# Patient Record
Sex: Male | Born: 1985 | Race: White | Hispanic: No | Marital: Single | Smoking: Never smoker
Health system: Southern US, Community
[De-identification: ages and names within clinical notes are randomized; demographics above are authoritative.]

## PROBLEM LIST (undated history)

## (undated) DIAGNOSIS — I10 Essential (primary) hypertension: Secondary | ICD-10-CM

## (undated) DIAGNOSIS — K589 Irritable bowel syndrome without diarrhea: Secondary | ICD-10-CM

## (undated) DIAGNOSIS — F988 Other specified behavioral and emotional disorders with onset usually occurring in childhood and adolescence: Secondary | ICD-10-CM

## (undated) DIAGNOSIS — E8881 Metabolic syndrome: Secondary | ICD-10-CM

## (undated) DIAGNOSIS — A0472 Enterocolitis due to Clostridium difficile, not specified as recurrent: Secondary | ICD-10-CM

## (undated) DIAGNOSIS — R7303 Prediabetes: Secondary | ICD-10-CM

## (undated) DIAGNOSIS — F329 Major depressive disorder, single episode, unspecified: Secondary | ICD-10-CM

## (undated) DIAGNOSIS — K829 Disease of gallbladder, unspecified: Secondary | ICD-10-CM

## (undated) DIAGNOSIS — E785 Hyperlipidemia, unspecified: Secondary | ICD-10-CM

## (undated) DIAGNOSIS — J45909 Unspecified asthma, uncomplicated: Secondary | ICD-10-CM

## (undated) DIAGNOSIS — M2550 Pain in unspecified joint: Secondary | ICD-10-CM

## (undated) DIAGNOSIS — M7989 Other specified soft tissue disorders: Secondary | ICD-10-CM

## (undated) DIAGNOSIS — K219 Gastro-esophageal reflux disease without esophagitis: Secondary | ICD-10-CM

## (undated) DIAGNOSIS — M549 Dorsalgia, unspecified: Secondary | ICD-10-CM

## (undated) DIAGNOSIS — E559 Vitamin D deficiency, unspecified: Secondary | ICD-10-CM

## (undated) DIAGNOSIS — R011 Cardiac murmur, unspecified: Secondary | ICD-10-CM

## (undated) DIAGNOSIS — C4490 Unspecified malignant neoplasm of skin, unspecified: Secondary | ICD-10-CM

## (undated) DIAGNOSIS — R519 Headache, unspecified: Secondary | ICD-10-CM

## (undated) DIAGNOSIS — R002 Palpitations: Secondary | ICD-10-CM

## (undated) DIAGNOSIS — G43109 Migraine with aura, not intractable, without status migrainosus: Secondary | ICD-10-CM

## (undated) DIAGNOSIS — D649 Anemia, unspecified: Secondary | ICD-10-CM

## (undated) DIAGNOSIS — K047 Periapical abscess without sinus: Secondary | ICD-10-CM

## (undated) DIAGNOSIS — B9689 Other specified bacterial agents as the cause of diseases classified elsewhere: Secondary | ICD-10-CM

## (undated) DIAGNOSIS — J309 Allergic rhinitis, unspecified: Principal | ICD-10-CM

## (undated) DIAGNOSIS — F419 Anxiety disorder, unspecified: Secondary | ICD-10-CM

## (undated) DIAGNOSIS — I499 Cardiac arrhythmia, unspecified: Secondary | ICD-10-CM

## (undated) DIAGNOSIS — R0602 Shortness of breath: Secondary | ICD-10-CM

## (undated) HISTORY — DX: Pain in unspecified joint: M25.50

## (undated) HISTORY — DX: Periapical abscess without sinus: K04.7

## (undated) HISTORY — DX: Irritable bowel syndrome without diarrhea: K58.9

## (undated) HISTORY — DX: Other specified behavioral and emotional disorders with onset usually occurring in childhood and adolescence: F98.8

## (undated) HISTORY — DX: Enterocolitis due to Clostridium difficile, not specified as recurrent: A04.72

## (undated) HISTORY — DX: Hyperlipidemia, unspecified: E78.5

## (undated) HISTORY — DX: Disease of gallbladder, unspecified: K82.9

## (undated) HISTORY — DX: Irritable bowel syndrome, unspecified: K58.9

## (undated) HISTORY — DX: Unspecified malignant neoplasm of skin, unspecified: C44.90

## (undated) HISTORY — DX: Metabolic syndrome: E88.81

## (undated) HISTORY — DX: Migraine with aura, not intractable, without status migrainosus: G43.109

## (undated) HISTORY — DX: Palpitations: R00.2

## (undated) HISTORY — DX: Anemia, unspecified: D64.9

## (undated) HISTORY — DX: Morbid (severe) obesity due to excess calories: E66.01

## (undated) HISTORY — PX: SKIN CANCER EXCISION: SHX779

## (undated) HISTORY — DX: Cardiac arrhythmia, unspecified: I49.9

## (undated) HISTORY — DX: Shortness of breath: R06.02

## (undated) HISTORY — DX: Other specified soft tissue disorders: M79.89

## (undated) HISTORY — DX: Major depressive disorder, single episode, unspecified: F32.9

## (undated) HISTORY — DX: Cardiac murmur, unspecified: R01.1

## (undated) HISTORY — DX: Vitamin D deficiency, unspecified: E55.9

## (undated) HISTORY — DX: Essential (primary) hypertension: I10

## (undated) HISTORY — DX: Unspecified asthma, uncomplicated: J45.909

## (undated) HISTORY — DX: Allergic rhinitis, unspecified: J30.9

## (undated) HISTORY — DX: Dorsalgia, unspecified: M54.9

## (undated) HISTORY — DX: Anxiety disorder, unspecified: F41.9

## (undated) HISTORY — DX: Prediabetes: R73.03

## (undated) HISTORY — DX: Other specified bacterial agents as the cause of diseases classified elsewhere: B96.89

## (undated) HISTORY — DX: Headache, unspecified: R51.9

---
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
## 2005-03-03 ENCOUNTER — Ambulatory Visit: Payer: Self-pay | Admitting: Internal Medicine

## 2005-03-05 ENCOUNTER — Ambulatory Visit: Payer: Self-pay | Admitting: Cardiology

## 2005-09-09 ENCOUNTER — Ambulatory Visit: Payer: Self-pay | Admitting: Internal Medicine

## 2005-10-06 ENCOUNTER — Ambulatory Visit: Payer: Self-pay | Admitting: Internal Medicine

## 2005-10-12 HISTORY — PX: ANKLE SURGERY: SHX546

## 2005-10-30 ENCOUNTER — Ambulatory Visit: Payer: Self-pay | Admitting: Internal Medicine

## 2005-10-30 ENCOUNTER — Ambulatory Visit: Payer: Self-pay | Admitting: Cardiology

## 2005-11-09 ENCOUNTER — Ambulatory Visit: Payer: Self-pay | Admitting: Internal Medicine

## 2005-11-27 ENCOUNTER — Ambulatory Visit: Payer: Self-pay | Admitting: Internal Medicine

## 2005-12-04 ENCOUNTER — Ambulatory Visit: Payer: Self-pay | Admitting: Internal Medicine

## 2005-12-04 ENCOUNTER — Ambulatory Visit (HOSPITAL_COMMUNITY): Admission: RE | Admit: 2005-12-04 | Discharge: 2005-12-04 | Payer: Self-pay | Admitting: Internal Medicine

## 2006-01-18 ENCOUNTER — Ambulatory Visit: Payer: Self-pay | Admitting: Internal Medicine

## 2006-02-28 ENCOUNTER — Encounter: Admission: RE | Admit: 2006-02-28 | Discharge: 2006-02-28 | Payer: Self-pay | Admitting: Orthopedic Surgery

## 2006-04-30 ENCOUNTER — Ambulatory Visit: Payer: Self-pay | Admitting: Internal Medicine

## 2006-05-07 ENCOUNTER — Encounter: Admission: RE | Admit: 2006-05-07 | Discharge: 2006-05-07 | Payer: Self-pay | Admitting: Orthopedic Surgery

## 2007-02-15 ENCOUNTER — Ambulatory Visit: Payer: Self-pay | Admitting: Internal Medicine

## 2007-03-08 ENCOUNTER — Ambulatory Visit: Payer: Self-pay | Admitting: Internal Medicine

## 2007-03-08 LAB — CONVERTED CEMR LAB
ALT: 32 units/L (ref 0–40)
AST: 24 units/L (ref 0–37)
Basophils Relative: 1 % (ref 0.0–1.0)
Bilirubin, Direct: 0.2 mg/dL (ref 0.0–0.3)
CO2: 32 meq/L (ref 19–32)
Calcium: 9.5 mg/dL (ref 8.4–10.5)
Chloride: 106 meq/L (ref 96–112)
Creatinine, Ser: 0.8 mg/dL (ref 0.4–1.5)
Eosinophils Absolute: 0.2 10*3/uL (ref 0.0–0.6)
Eosinophils Relative: 3.5 % (ref 0.0–5.0)
GFR calc non Af Amer: 131 mL/min
Glucose, Bld: 88 mg/dL (ref 70–99)
HCT: 43.3 % (ref 39.0–52.0)
Hgb A1c MFr Bld: 5.5 % (ref 4.6–6.0)
MCV: 81.9 fL (ref 78.0–100.0)
Neutrophils Relative %: 55.7 % (ref 43.0–77.0)
RBC: 5.29 M/uL (ref 4.22–5.81)
Sodium: 142 meq/L (ref 135–145)
Total Bilirubin: 1.4 mg/dL — ABNORMAL HIGH (ref 0.3–1.2)
Total CHOL/HDL Ratio: 5.2
Total Protein: 7.3 g/dL (ref 6.0–8.3)
VLDL: 19 mg/dL (ref 0–40)
WBC: 7 10*3/uL (ref 4.5–10.5)

## 2007-03-17 ENCOUNTER — Ambulatory Visit: Payer: Self-pay | Admitting: Cardiovascular Disease

## 2007-06-13 ENCOUNTER — Emergency Department (HOSPITAL_COMMUNITY): Admission: EM | Admit: 2007-06-13 | Discharge: 2007-06-13 | Payer: Self-pay | Admitting: Emergency Medicine

## 2007-06-15 ENCOUNTER — Ambulatory Visit: Payer: Self-pay | Admitting: Internal Medicine

## 2007-09-13 ENCOUNTER — Ambulatory Visit: Payer: Self-pay | Admitting: Internal Medicine

## 2007-09-13 ENCOUNTER — Telehealth: Payer: Self-pay | Admitting: Internal Medicine

## 2007-09-13 DIAGNOSIS — F988 Other specified behavioral and emotional disorders with onset usually occurring in childhood and adolescence: Secondary | ICD-10-CM | POA: Insufficient documentation

## 2007-09-13 DIAGNOSIS — K219 Gastro-esophageal reflux disease without esophagitis: Secondary | ICD-10-CM | POA: Insufficient documentation

## 2007-09-13 DIAGNOSIS — F3289 Other specified depressive episodes: Secondary | ICD-10-CM

## 2007-09-13 DIAGNOSIS — I10 Essential (primary) hypertension: Secondary | ICD-10-CM | POA: Insufficient documentation

## 2007-09-13 DIAGNOSIS — F329 Major depressive disorder, single episode, unspecified: Secondary | ICD-10-CM

## 2007-09-13 DIAGNOSIS — F411 Generalized anxiety disorder: Secondary | ICD-10-CM | POA: Insufficient documentation

## 2007-09-13 HISTORY — DX: Major depressive disorder, single episode, unspecified: F32.9

## 2007-09-13 HISTORY — DX: Essential (primary) hypertension: I10

## 2007-09-13 HISTORY — DX: Other specified depressive episodes: F32.89

## 2007-09-13 HISTORY — DX: Other specified behavioral and emotional disorders with onset usually occurring in childhood and adolescence: F98.8

## 2008-07-19 ENCOUNTER — Encounter: Payer: Self-pay | Admitting: Internal Medicine

## 2008-07-19 ENCOUNTER — Ambulatory Visit: Payer: Self-pay | Admitting: Internal Medicine

## 2008-07-19 DIAGNOSIS — J45909 Unspecified asthma, uncomplicated: Secondary | ICD-10-CM

## 2008-07-19 DIAGNOSIS — J4530 Mild persistent asthma, uncomplicated: Secondary | ICD-10-CM | POA: Insufficient documentation

## 2008-07-19 HISTORY — DX: Unspecified asthma, uncomplicated: J45.909

## 2008-07-19 HISTORY — DX: Morbid (severe) obesity due to excess calories: E66.01

## 2008-07-23 ENCOUNTER — Encounter: Payer: Self-pay | Admitting: Internal Medicine

## 2008-08-15 ENCOUNTER — Ambulatory Visit: Payer: Self-pay | Admitting: Internal Medicine

## 2008-08-22 ENCOUNTER — Telehealth: Payer: Self-pay | Admitting: Internal Medicine

## 2008-08-28 ENCOUNTER — Telehealth: Payer: Self-pay | Admitting: Internal Medicine

## 2008-11-28 ENCOUNTER — Ambulatory Visit: Payer: Self-pay | Admitting: Endocrinology

## 2008-12-03 ENCOUNTER — Emergency Department: Payer: Self-pay

## 2008-12-03 ENCOUNTER — Emergency Department (HOSPITAL_COMMUNITY): Admission: EM | Admit: 2008-12-03 | Discharge: 2008-12-03 | Payer: Self-pay | Admitting: Emergency Medicine

## 2009-03-18 ENCOUNTER — Emergency Department (HOSPITAL_COMMUNITY): Admission: EM | Admit: 2009-03-18 | Discharge: 2009-03-18 | Payer: Self-pay | Admitting: Emergency Medicine

## 2009-03-18 ENCOUNTER — Telehealth: Payer: Self-pay | Admitting: Internal Medicine

## 2009-03-20 ENCOUNTER — Ambulatory Visit: Payer: Self-pay | Admitting: Internal Medicine

## 2009-03-20 DIAGNOSIS — K589 Irritable bowel syndrome without diarrhea: Secondary | ICD-10-CM

## 2009-03-20 HISTORY — DX: Irritable bowel syndrome, unspecified: K58.9

## 2009-03-21 LAB — CONVERTED CEMR LAB
Basophils Relative: 0.3 % (ref 0.0–3.0)
Eosinophils Absolute: 0.2 10*3/uL (ref 0.0–0.7)
HCT: 44.3 % (ref 39.0–52.0)
Hemoglobin: 15 g/dL (ref 13.0–17.0)
Lymphs Abs: 2.5 10*3/uL (ref 0.7–4.0)
MCHC: 33.8 g/dL (ref 30.0–36.0)
MCV: 85.8 fL (ref 78.0–100.0)
Monocytes Absolute: 0.8 10*3/uL (ref 0.1–1.0)
Neutro Abs: 5.3 10*3/uL (ref 1.4–7.7)
RBC: 5.16 M/uL (ref 4.22–5.81)

## 2009-06-10 ENCOUNTER — Ambulatory Visit: Payer: Self-pay | Admitting: Internal Medicine

## 2009-06-10 DIAGNOSIS — J310 Chronic rhinitis: Secondary | ICD-10-CM | POA: Insufficient documentation

## 2009-06-10 DIAGNOSIS — J309 Allergic rhinitis, unspecified: Secondary | ICD-10-CM

## 2009-06-10 HISTORY — DX: Allergic rhinitis, unspecified: J30.9

## 2009-08-20 ENCOUNTER — Telehealth: Payer: Self-pay | Admitting: Internal Medicine

## 2009-10-10 ENCOUNTER — Ambulatory Visit: Payer: Self-pay | Admitting: Internal Medicine

## 2009-10-10 ENCOUNTER — Encounter: Payer: Self-pay | Admitting: Internal Medicine

## 2009-10-10 DIAGNOSIS — E785 Hyperlipidemia, unspecified: Secondary | ICD-10-CM | POA: Insufficient documentation

## 2009-10-10 HISTORY — DX: Hyperlipidemia, unspecified: E78.5

## 2009-10-10 LAB — CONVERTED CEMR LAB
ALT: 35 units/L (ref 0–53)
Albumin: 3.6 g/dL (ref 3.5–5.2)
Alkaline Phosphatase: 62 units/L (ref 39–117)
Basophils Relative: 1 % (ref 0.0–3.0)
Bilirubin, Direct: 0.2 mg/dL (ref 0.0–0.3)
CO2: 31 meq/L (ref 19–32)
Calcium: 9.2 mg/dL (ref 8.4–10.5)
Chloride: 104 meq/L (ref 96–112)
Eosinophils Absolute: 0.2 10*3/uL (ref 0.0–0.7)
Eosinophils Relative: 2.2 % (ref 0.0–5.0)
Hemoglobin: 14.7 g/dL (ref 13.0–17.0)
LDL Cholesterol: 108 mg/dL — ABNORMAL HIGH (ref 0–99)
Lymphocytes Relative: 17.1 % (ref 12.0–46.0)
MCHC: 34.2 g/dL (ref 30.0–36.0)
MCV: 86.5 fL (ref 78.0–100.0)
Neutro Abs: 4.2 10*3/uL (ref 1.4–7.7)
RBC: 4.97 M/uL (ref 4.22–5.81)
Sodium: 140 meq/L (ref 135–145)
Total CHOL/HDL Ratio: 4
Total Protein: 8 g/dL (ref 6.0–8.3)
WBC: 7 10*3/uL (ref 4.5–10.5)

## 2009-10-25 ENCOUNTER — Ambulatory Visit: Payer: Self-pay | Admitting: Internal Medicine

## 2009-10-25 DIAGNOSIS — E8881 Metabolic syndrome: Secondary | ICD-10-CM

## 2009-10-25 HISTORY — DX: Metabolic syndrome: E88.81

## 2009-10-25 HISTORY — DX: Metabolic syndrome: E88.810

## 2009-10-25 LAB — CONVERTED CEMR LAB: LDL Goal: 130 mg/dL

## 2009-12-02 ENCOUNTER — Ambulatory Visit: Payer: Self-pay | Admitting: Internal Medicine

## 2009-12-02 ENCOUNTER — Emergency Department (HOSPITAL_COMMUNITY): Admission: EM | Admit: 2009-12-02 | Discharge: 2009-12-03 | Payer: Self-pay | Admitting: Emergency Medicine

## 2009-12-03 ENCOUNTER — Telehealth: Payer: Self-pay | Admitting: Internal Medicine

## 2009-12-04 ENCOUNTER — Telehealth: Payer: Self-pay | Admitting: Internal Medicine

## 2009-12-05 ENCOUNTER — Ambulatory Visit: Payer: Self-pay | Admitting: Internal Medicine

## 2009-12-12 ENCOUNTER — Ambulatory Visit: Payer: Self-pay | Admitting: Internal Medicine

## 2009-12-12 ENCOUNTER — Encounter: Payer: Self-pay | Admitting: Internal Medicine

## 2009-12-13 ENCOUNTER — Ambulatory Visit: Payer: Self-pay | Admitting: Cardiology

## 2010-06-11 ENCOUNTER — Emergency Department (HOSPITAL_COMMUNITY): Admission: EM | Admit: 2010-06-11 | Discharge: 2010-06-12 | Payer: Self-pay | Admitting: Emergency Medicine

## 2010-06-12 ENCOUNTER — Telehealth: Payer: Self-pay | Admitting: Internal Medicine

## 2010-06-13 ENCOUNTER — Ambulatory Visit: Payer: Self-pay | Admitting: Internal Medicine

## 2010-06-13 DIAGNOSIS — T7840XA Allergy, unspecified, initial encounter: Secondary | ICD-10-CM | POA: Insufficient documentation

## 2010-06-13 LAB — CONVERTED CEMR LAB
AST: 21 units/L (ref 0–37)
Albumin: 3.8 g/dL (ref 3.5–5.2)
Alkaline Phosphatase: 55 units/L (ref 39–117)
Basophils Relative: 0.4 % (ref 0.0–3.0)
Calcium: 9.1 mg/dL (ref 8.4–10.5)
GFR calc non Af Amer: 115.95 mL/min (ref >60)
Hemoglobin: 15 g/dL (ref 13.0–17.0)
Lymphocytes Relative: 15.4 % (ref 12.0–46.0)
Monocytes Relative: 3.2 % (ref 3.0–12.0)
Neutro Abs: 13.6 10*3/uL — ABNORMAL HIGH (ref 1.4–7.7)
RBC: 5.23 M/uL (ref 4.22–5.81)
Sodium: 137 meq/L (ref 135–145)
Total Protein: 7.3 g/dL (ref 6.0–8.3)

## 2010-06-15 ENCOUNTER — Encounter: Payer: Self-pay | Admitting: Internal Medicine

## 2010-06-18 ENCOUNTER — Telehealth: Payer: Self-pay | Admitting: Internal Medicine

## 2010-06-30 ENCOUNTER — Ambulatory Visit: Payer: Self-pay | Admitting: Internal Medicine

## 2010-06-30 DIAGNOSIS — J019 Acute sinusitis, unspecified: Secondary | ICD-10-CM | POA: Insufficient documentation

## 2010-06-30 DIAGNOSIS — D72829 Elevated white blood cell count, unspecified: Secondary | ICD-10-CM | POA: Insufficient documentation

## 2010-06-30 DIAGNOSIS — R059 Cough, unspecified: Secondary | ICD-10-CM | POA: Insufficient documentation

## 2010-06-30 DIAGNOSIS — R05 Cough: Secondary | ICD-10-CM | POA: Insufficient documentation

## 2010-06-30 LAB — CONVERTED CEMR LAB
Basophils Relative: 0.6 % (ref 0.0–3.0)
Eosinophils Absolute: 0.3 10*3/uL (ref 0.0–0.7)
IgE (Immunoglobulin E), Serum: 64.1 intl units/mL (ref 0.0–180.0)
Lymphs Abs: 2.9 10*3/uL (ref 0.7–4.0)
MCHC: 33.5 g/dL (ref 30.0–36.0)
MCV: 84.8 fL (ref 78.0–100.0)
Monocytes Absolute: 0.7 10*3/uL (ref 0.1–1.0)
Neutrophils Relative %: 64.4 % (ref 43.0–77.0)
RBC: 5 M/uL (ref 4.22–5.81)

## 2010-07-01 ENCOUNTER — Telehealth: Payer: Self-pay | Admitting: Internal Medicine

## 2010-11-02 ENCOUNTER — Encounter: Payer: Self-pay | Admitting: Orthopedic Surgery

## 2010-11-13 NOTE — Progress Notes (Signed)
  Phone Note Call from Patient   Caller: Patient [phone] Call For: Etta Grandchild MD Summary of Call: Pt requesting lab results, results letter not rec'd yet in the mail. Please call pt Initial call taken by: Verdell Face,  June 18, 2010 11:43 AM  Follow-up for Phone Call        Pt recieved results in the mail. Follow-up by: Lamar Sprinkles, CMA,  June 18, 2010 12:18 PM

## 2010-11-13 NOTE — Progress Notes (Signed)
Summary: sick call  Phone Note Call from Patient Call back at Home Phone [phone]   Caller: spouse--Lisa [phone] Summary of Call: Patient spouse called stating that the patient would like for MD to know how sick he is. Patient feels that this is something more serious than what was discussed at office visit. Per spouse the patient had to go to the ER Monday night due to headache, chills, increased BP, increased heart rate, and fever of 102.5. Patient was told at the ER that he was negative for pneumonia, strep, and mono. Please advise. Initial call taken by: Lucious Groves,  December 04, 2009 1:46 PM  Follow-up for Phone Call        schedule a f/up with me in the next 1-2 days Follow-up by: Etta Grandchild MD,  December 04, 2009 1:52 PM     Appended Document: sick call Spoke with patient and he advised me that his father just had hand surgery and he will call back for appt later today or tomorrow.

## 2010-11-13 NOTE — Miscellaneous (Signed)
Summary: Orders Update pft charges  Clinical Lists Changes  Orders: Added new Service order of Carbon Monoxide diffusing w/capacity (94720) - Signed Added new Service order of Lung Volumes (94240) - Signed Added new Service order of Spirometry (Pre & Post) (94060) - Signed 

## 2010-11-13 NOTE — Letter (Signed)
Summary: Results Follow-up Letter  [facility]  [address] [place], [place] [address]   Phone: [phone]  Fax: [phone]    06/15/2010  [address] [place], [place]  [address]  Dear Mr. Berrett,   The following are the results of your recent test(s):  Test     Result     CBC       high WBC count, ? from steroids Liver/kidney   normal Thyroid     normal   _________________________________________________________  Please call for an appointment in 2-3 weeks _________________________________________________________ _________________________________________________________ _________________________________________________________  Sincerely,  Sanda Linger MD [facility]

## 2010-11-13 NOTE — Assessment & Plan Note (Signed)
Summary: f/u appt/#/cd   Vital Signs:  Patient profile:   25 year old male Height:      75 inches Weight:      362.25 pounds BMI:     45.44 O2 Sat:      97 % on Room air Temp:     98.1 degrees F oral Pulse rate:   87 / minute Pulse rhythm:   regular Resp:     16 per minute BP sitting:   122 / 88  (left arm)  Vitals Entered By: Lucious Groves (December 05, 2009 2:03 PM)  Nutrition Counseling: Patient's BMI is greater than 25 and therefore counseled on weight management options.  O2 Flow:  Room air CC: Rtn ov--Pt states that he is doing worse. C/O HA, fever, increased BP, coughing up blood, increased SOB, and pustules on tonsils./kb, Hypertension Management Comments Per the patient he had to go to the ER on Monday night for symptoms above and this is how he found out about pustules on tonsils./kb   Primary Care Provider:  Sanda Linger, MD  CC:  Rtn ov--Pt states that he is doing worse. C/O HA, fever, increased BP, coughing up blood, increased SOB, and pustules on tonsils./kb, and Hypertension Management.  History of Present Illness: He returns for f/up and is slowly getting better with a decrease in his sore throat and fever. He went to the ER three days ago and had a normal Chest Xray and other labs showed a slight increase in WBC count. He has finished the Zithromax. His headache has resolved.  Hypertension History:      He denies chest pain, palpitations, dyspnea with exertion, peripheral edema, visual symptoms, neurologic problems, syncope, and side effects from treatment.  He notes no problems with any antihypertensive medication side effects.        Positive major cardiovascular risk factors include hyperlipidemia and hypertension.  Negative major cardiovascular risk factors include male age less than 76 years old, no history of diabetes, negative family history for ischemic heart disease, and non-tobacco-user status.        Further assessment for target organ damage reveals no  history of ASHD, cardiac end-organ damage (CHF/LVH), stroke/TIA, peripheral vascular disease, renal insufficiency, or hypertensive retinopathy.     Preventive Screening-Counseling & Management  Alcohol-Tobacco     Alcohol drinks/day: <1     Smoking Status: never     Passive Smoke Exposure: no  Hep-HIV-STD-Contraception     Hepatitis Risk: no risk noted     HIV Risk: no     STD Risk: no risk noted      Sexual History:  not active.        Drug Use:  no.    Medications Prior to Update: 1)  Wellbutrin 75 Mg Tabs (Bupropion Hcl) .... Take 1 Tablet By Mouth Once A Day Follow-Up Appt Is Due No Addtional Refilsl Until Appt 2)  Concerta 36 Mg  Tbcr (Methylphenidate Hcl) .... Take 1 Tablet By Mouth Once A Day As Needed 3)  Xanax 0.5 Mg  Tabs (Alprazolam) .... Take 1 Tablet By Mouth Once A Day As Needed 4)  Omeprazole 40 Mg Cpdr (Omeprazole) .Marland Kitchen.. 1 Once Daily/stop Zegerid 5)  Hyoscyamine Sulfate 0.125 Mg Subl (Hyoscyamine Sulfate) .... As Needed For Cramping 6)  Hyoscyamine Sulfate Cr 0.375 Mg  Tb12 (Hyoscyamine Sulfate) .... Take 1 Tablet By Mouth Two Times A Day 7)  Cetirizine Hcl 10 Mg Tabs (Cetirizine Hcl) .Marland Kitchen.. 1po Once Daily As Needed 8)  Omnaris  50 Mcg/act Susp (Ciclesonide) .... 2 Spray/side Once Daily 9)  Losartan Potassium 100 Mg Tabs (Losartan Potassium) .... Once Daily For High Blood Pressure 10)  Mytussin Ac 100-10 Mg/32ml Syrp (Guaifenesin-Codeine) .... 5-10 Ml By Mouth Qid As Needed For Cough  Current Medications (verified): 1)  Wellbutrin 75 Mg Tabs (Bupropion Hcl) .... Take 1 Tablet By Mouth Once A Day Follow-Up Appt Is Due No Addtional Refilsl Until Appt 2)  Concerta 36 Mg  Tbcr (Methylphenidate Hcl) .... Take 1 Tablet By Mouth Once A Day As Needed 3)  Xanax 0.5 Mg  Tabs (Alprazolam) .... Take 1 Tablet By Mouth Once A Day As Needed 4)  Omeprazole 40 Mg Cpdr (Omeprazole) .Marland Kitchen.. 1 Once Daily/stop Zegerid 5)  Hyoscyamine Sulfate 0.125 Mg Subl (Hyoscyamine Sulfate) .... As Needed  For Cramping 6)  Hyoscyamine Sulfate Cr 0.375 Mg  Tb12 (Hyoscyamine Sulfate) .... Take 1 Tablet By Mouth Two Times A Day 7)  Cetirizine Hcl 10 Mg Tabs (Cetirizine Hcl) .Marland Kitchen.. 1po Once Daily As Needed 8)  Omnaris 50 Mcg/act Susp (Ciclesonide) .... 2 Spray/side Once Daily 9)  Losartan Potassium 100 Mg Tabs (Losartan Potassium) .... Once Daily For High Blood Pressure 10)  Mytussin Ac 100-10 Mg/59ml Syrp (Guaifenesin-Codeine) .... 5-10 Ml By Mouth Qid As Needed For Cough 11)  Proair Hfa 108 (90 Base) Mcg/act Aers (Albuterol Sulfate) .Marland Kitchen.. 1-2 Puffs Qid As Needed For Sob and Wheezing  Allergies (verified): 1)  ! Septra 2)  ! Augmentin  Past History:  Past Medical History: Reviewed history from 10/10/2009 and no changes required. morbid obesity migraine headache attention deficit disorder anxiety/depression gastroesophageal reflux disease allergic rhinitis Irritable bowel syndrome Hypertension Allergic rhinitis Hyperlipidemia  Past Surgical History: Reviewed history from 09/13/2007 and no changes required. right ankle surgery due to fracture Appendectomy  Family History: Reviewed history from 03/20/2009 and no changes required. Family History of Clotting disorder: Mother Family History of Heart Disease: Both Parents Families  Social History: Reviewed history from 07/19/2008 and no changes required. Occupation:  works [employer] Never Smoked Alcohol use-yes Drug use-no Regular exercise-no Sexual History:  not active  Review of Systems       The patient complains of fever.  The patient denies anorexia, chest pain, syncope, dyspnea on exertion, peripheral edema, prolonged cough, hemoptysis, abdominal pain, hematuria, suspicious skin lesions, difficulty walking, enlarged lymph nodes, and angioedema.   ENT:  Complains of sore throat; denies decreased hearing, difficulty swallowing, ear discharge, earache, nasal congestion, postnasal drainage, ringing in ears, and sinus pressure. Resp:   Complains of shortness of breath and wheezing; denies chest discomfort, chest pain with inspiration, coughing up blood, morning headaches, pleuritic, and sputum productive.  Physical Exam  General:  mild distress with an intermittent cough.  alert, well-developed, well-nourished, well-hydrated, appropriate dress, cooperative to examination, good hygiene, overweight-appearing, and mild distress.   Head:  normocephalic, atraumatic, no abnormalities observed, and no abnormalities palpated.   Ears:  R ear normal and L ear normal.   Nose:  nasal discharge, mucosal erythema, and mucosal edema.  no airflow obstruction, no intranasal foreign body, no nasal polyps, no nasal mucosal lesions, no mucosal friability, no active bleeding or clots, no septum abnormalities, nasal dischargemucosal pallor, mucosal erythema, mucosal edema, L maxillary sinus tenderness, and R maxillary sinus tenderness.   Mouth:  good dentition, no exudates, no posterior lymphoid hypertrophy, no postnasal drip, no pharyngeal crowing, no lesions, no aphthous ulcers, no erosions, no tongue abnormalities, no leukoplakia, no petechiae, and pharyngeal erythema.   Neck:  No deformities, masses, or tenderness noted. Lungs:  Normal respiratory effort, chest expands symmetrically. Lungs are clear to auscultation, no crackles or wheezes. Heart:  Normal rate and regular rhythm. S1 and S2 normal without gallop, murmur, click, rub or other extra sounds. Abdomen:  Bowel sounds positive,abdomen soft and non-tender without masses, organomegaly or hernias noted. Msk:  No deformity or scoliosis noted of thoracic or lumbar spine.   Pulses:  R and L carotid,radial,femoral,dorsalis pedis and posterior tibial pulses are full and equal bilaterally Extremities:  No clubbing, cyanosis, edema, or deformity noted with normal full range of motion of all joints.   Neurologic:  No cranial nerve deficits noted. Station and gait are normal. Plantar reflexes are  down-going bilaterally. DTRs are symmetrical throughout. Sensory, motor and coordinative functions appear intact. Skin:  Intact without suspicious lesions or rashes Cervical Nodes:  no anterior cervical adenopathy and no posterior cervical adenopathy.   Axillary Nodes:  no R axillary adenopathy and no L axillary adenopathy.   Inguinal Nodes:  no R inguinal adenopathy and no L inguinal adenopathy.   Psych:  Oriented X3, memory intact for recent and remote, normally interactive, good eye contact, not anxious appearing, not depressed appearing, not agitated, and not suicidal.     Impression & Recommendations:  Problem # 1:  BRONCHITIS-ACUTE (ICD-466.0) Assessment Unchanged this sounds viral but I am a little concerned about asthma so will start proair, PFT's are scheduled for next week. His updated medication list for this problem includes:    Mytussin Ac 100-10 Mg/16ml Syrp (Guaifenesin-codeine) .Marland Kitchen... 5-10 ml by mouth qid as needed for cough    Proair Hfa 108 (90 Base) Mcg/act Aers (Albuterol sulfate) .Marland Kitchen... 1-2 puffs qid as needed for sob and wheezing  Problem # 2:  ASTHMA, UNSPECIFIED, UNSPECIFIED STATUS (ICD-493.90) Assessment: Unchanged  His updated medication list for this problem includes:    Proair Hfa 108 (90 Base) Mcg/act Aers (Albuterol sulfate) .Marland Kitchen... 1-2 puffs qid as needed for sob and wheezing  Problem # 3:  HYPERTENSION (ICD-401.9) Assessment: Improved  His updated medication list for this problem includes:    Losartan Potassium 100 Mg Tabs (Losartan potassium) ..... Once daily for high blood pressure  BP today: 122/88 Prior BP: 120/88 (12/02/2009)  Prior 10 Yr Risk Heart Disease: 4 % (08/15/2008)  Labs Reviewed: K+: 4.1 (10/10/2009) Creat: : 1.0 (10/10/2009)   Chol: 162 (10/10/2009)   HDL: 38.40 (10/10/2009)   LDL: 108 (10/10/2009)   TG: 77.0 (10/10/2009)  Complete Medication List: 1)  Wellbutrin 75 Mg Tabs (Bupropion hcl) .... Take 1 tablet by mouth once a day  follow-up appt is due no addtional refilsl until appt 2)  Concerta 36 Mg Tbcr (Methylphenidate hcl) .... Take 1 tablet by mouth once a day as needed 3)  Xanax 0.5 Mg Tabs (Alprazolam) .... Take 1 tablet by mouth once a day as needed 4)  Omeprazole 40 Mg Cpdr (Omeprazole) .Marland Kitchen.. 1 once daily/stop zegerid 5)  Hyoscyamine Sulfate 0.125 Mg Subl (Hyoscyamine sulfate) .... As needed for cramping 6)  Hyoscyamine Sulfate Cr 0.375 Mg Tb12 (Hyoscyamine sulfate) .... Take 1 tablet by mouth two times a day 7)  Cetirizine Hcl 10 Mg Tabs (Cetirizine hcl) .Marland Kitchen.. 1po once daily as needed 8)  Omnaris 50 Mcg/act Susp (Ciclesonide) .... 2 spray/side once daily 9)  Losartan Potassium 100 Mg Tabs (Losartan potassium) .... Once daily for high blood pressure 10)  Mytussin Ac 100-10 Mg/80ml Syrp (Guaifenesin-codeine) .... 5-10 ml by mouth qid as needed for cough 11)  Proair Hfa 108 (90 Base) Mcg/act Aers (Albuterol sulfate) .Marland Kitchen.. 1-2 puffs qid as needed for sob and wheezing  Hypertension Assessment/Plan:      The patient's hypertensive risk group is category B: At least one risk factor (excluding diabetes) with no target organ damage.  His calculated 10 year risk of coronary heart disease is 4 %.  Today's blood pressure is 122/88.  His blood pressure goal is < 140/90.  Patient Instructions: 1)  It is important to use your inhaler properly. Use a spacer, take slow deep breaths and hold them. Rinse your mouth after using.  2)  Get plenty of rest, drink lots of clear liquids, and use Tylenol or Ibuprofen for fever and comfort. Return in 7-10 days if you're not better:sooner if you're feeling worse. 3)  Please schedule a follow-up appointment in 2 weeks. Prescriptions: PROAIR HFA 108 (90 BASE) MCG/ACT AERS (ALBUTEROL SULFATE) 1-2 puffs QID as needed for sob and wheezing  #1 inh x 11   Entered and Authorized by:   Etta Grandchild MD   Signed by:   Etta Grandchild MD on 12/05/2009   Method used:   Electronically to        [facility]   [address] [phone]* (retail)       [address]       [place], [place]  [address]       Ph: [phone]       Fax: [phone]   RxID:   [id number]

## 2010-11-13 NOTE — Progress Notes (Signed)
Summary: RESULTS  Phone Note Call from Patient Call back at Home Phone [phone] Call back at [phone]   Caller: Mother - Misty Stanley Summary of Call: Pt's mother is req results of cxr.  Initial call taken by: Lamar Sprinkles, CMA,  July 01, 2010 5:10 PM  Follow-up for Phone Call        chest xray was normal, WBC is still very slightly elevated, allergy tests are not back yet Follow-up by: Etta Grandchild MD,  July 02, 2010 7:40 AM  Additional Follow-up for Phone Call Additional follow up Details #1::        Pt notified.Alvy Beal Archie CMA  July 02, 2010 4:36 PM

## 2010-11-13 NOTE — Assessment & Plan Note (Signed)
Summary: FU Alexis [name]   Vital Signs:  Patient profile:   25 year old male Height:      75 inches Weight:      391 pounds BMI:     49.05 O2 Sat:      96 % on Room air Temp:     100.2 degrees F oral Pulse rate:   90 / minute Pulse rhythm:   regular Resp:     16 per minute BP sitting:   124 / 88  (left arm) Cuff size:   large  Vitals Entered By: Alysia Penna (June 30, 2010 3:53 PM)  Nutrition Counseling: Patient's BMI is greater than 25 and therefore counseled on weight management options.  O2 Flow:  Room air CC: pt here for follow up. has had a cough for about a week. /cp sma, Cough Comments pt no longer taking Hyoscyamine   Primary Care [name]:  Sanda Linger, MD  CC:  pt here for follow up. has had a cough for about a week. /cp sma and Cough.  History of Present Illness:  Cough      This is a 25 year old man who presents with Cough.  The symptoms began 1 week ago.  The intensity is described as mild.  The patient reports productive cough and fever, but denies non-productive cough, pleuritic chest pain, shortness of breath, wheezing, exertional dyspnea, hemoptysis, and malaise.  Associated symtpoms include cold/URI symptoms, sore throat, nasal congestion, and chronic rhinitis.  The patient denies the following symptoms: weight loss, acid reflux symptoms, and peripheral edema.  The cough is worse with cold exposure.  Ineffective prior treatments have included albuterol inhaler and other asthma medication.  Risk factors include recurrent sinus infections and history of allergic rhinitis.    Preventive Screening-Counseling & Management  Alcohol-Tobacco     Alcohol drinks/day: <1     Alcohol type: all     >5/day in last 3 mos: no     Alcohol Counseling: to STOP drinking     Feels need to cut down: no     Feels annoyed by complaints: no     Feels guilty re: drinking: no     Needs 'eye opener' in am: no     Smoking Status: never     Passive Smoke Exposure: yes  Tobacco Counseling: not indicated; no tobacco use  Hep-HIV-STD-Contraception     Hepatitis Risk: no risk noted     HIV Risk: no risk noted     STD Risk: no risk noted      Sexual History:  not active.        Drug Use:  never.        Blood Transfusions:  no.    Clinical Review Panels:  Prevention   Last Colonoscopy:  Results: Normal. Location:  [facility].  (12/04/2005)  Immunizations   Last Flu Vaccine:  Fluvax 3+ (09/24/2009)  Lipid Management   Cholesterol:  162 (10/10/2009)   LDL (bad choesterol):  108 (10/10/2009)   HDL (good cholesterol):  38.40 (10/10/2009)  Diabetes Management   HgBA1C:  5.5 (03/08/2007)   Creatinine:  0.9 (06/13/2010)   Last Flu Vaccine:  Fluvax 3+ (09/24/2009)  CBC   WBC:  16.8 (06/13/2010)   RBC:  5.23 (06/13/2010)   Hgb:  15.0 (06/13/2010)   Hct:  44.3 (06/13/2010)   Platelets:  234.0 (06/13/2010)   MCV  84.7 (06/13/2010)   MCHC  33.8 (06/13/2010)   RDW  14.0 (06/13/2010)   PMN:  80.9 (06/13/2010)   Lymphs:  15.4 (06/13/2010)   Monos:  3.2 (06/13/2010)   Eosinophils:  0.1 (06/13/2010)   Basophil:  0.4 (06/13/2010)  Complete Metabolic Panel   Glucose:  76 (06/13/2010)   Sodium:  137 (06/13/2010)   Potassium:  4.0 (06/13/2010)   Chloride:  102 (06/13/2010)   CO2:  26 (06/13/2010)   BUN:  10 (06/13/2010)   Creatinine:  0.9 (06/13/2010)   Albumin:  3.8 (06/13/2010)   Total Protein:  7.3 (06/13/2010)   Calcium:  9.1 (06/13/2010)   Total Bili:  1.1 (06/13/2010)   Alk Phos:  55 (06/13/2010)   SGPT (ALT):  32 (06/13/2010)   SGOT (AST):  21 (06/13/2010)   Medications Prior to Update: 1)  Wellbutrin 75 Mg Tabs (Bupropion Hcl) .... Take 1 Tablet By Mouth Once A Day Follow-Up Appt Is Due No Addtional Refilsl Until Appt 2)  Concerta 36 Mg  Tbcr (Methylphenidate Hcl) .... Take 1 Tablet By Mouth Once A Day As Needed 3)  Omeprazole 40 Mg Cpdr (Omeprazole) .... Take One 30-60 Min Before First and Last Meals of The Day 4)   Hyoscyamine Sulfate Cr 0.375 Mg  Tb12 (Hyoscyamine Sulfate) .... Take 1 Tablet By Mouth Two Times A Day 5)  Proair Hfa 108 (90 Base) Mcg/act Aers (Albuterol Sulfate) .Marland Kitchen.. 1-2 Puffs Qid As Needed For Sob and Wheezing 6)  Diovan 160 Mg  Tabs (Valsartan) .... One By Mouth Daily 7)  Prednisone 10 Mg  Tabs (Prednisone) .... 4 Each Am X 2days, 2x2days, 1x2days and Stop 8)  Vistaril 25 Mg Caps (Hydroxyzine Pamoate) .Marland Kitchen.. 1-2 By Mouth Qid As Needed For Itching  Current Medications (verified): 1)  Wellbutrin 75 Mg Tabs (Bupropion Hcl) .... Take 1 Tablet By Mouth Once A Day Follow-Up Appt Is Due No Addtional Refilsl Until Appt 2)  Concerta 36 Mg  Tbcr (Methylphenidate Hcl) .... Take 1 Tablet By Mouth Once A Day As Needed 3)  Omeprazole 40 Mg Cpdr (Omeprazole) .... Take One 30-60 Min Before First and Last Meals of The Day 4)  Hyoscyamine Sulfate Cr 0.375 Mg  Tb12 (Hyoscyamine Sulfate) .... Take 1 Tablet By Mouth Two Times A Day 5)  Proair Hfa 108 (90 Base) Mcg/act Aers (Albuterol Sulfate) .Marland Kitchen.. 1-2 Puffs Qid As Needed For Sob and Wheezing 6)  Diovan 160 Mg  Tabs (Valsartan) .... One By Mouth Daily 7)  Avelox 400 Mg Tabs (Moxifloxacin Hcl) .... One By Mouth Once Daily For 10 Days  Allergies (verified): 1)  ! Septra 2)  ! Augmentin  Past History:  Past Medical History: Last updated: 12/12/2009 morbid obesity migraine headache attention deficit disorder anxiety/depression gastroesophageal reflux disease allergic rhinitis Irritable bowel syndrome Hypertension Allergic rhinitis Hyperlipidemia Cough    - onset 09/2009 in setting of ? 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 Both Parents smoke  Risk Factors: Alcohol Use: <1 (06/30/2010) >5 drinks/d w/in last 3 months: no (06/30/2010) Exercise: no (07/19/2008)  Risk Factors: Smoking Status: never (06/30/2010) Passive Smoke Exposure: yes (06/30/2010)  Family History: Reviewed history from 12/12/2009 and no changes required. Family History of Clotting disorder: Mother Family History of Heart Disease: Both Parents Families Lung CA- MGM  wheezing in mother with colds   Social History: Reviewed history from 12/12/2009 and no changes required. 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 Both Parents smoke  Review of Systems  The patient denies anorexia, weight loss, hoarseness, chest pain, syncope, dyspnea on exertion, peripheral edema, headaches, hemoptysis, abdominal pain, hematuria, suspicious skin lesions, difficulty walking, depression, enlarged lymph nodes, and angioedema.   General:  Denies chills, fatigue, loss of appetite, malaise, sleep disorder, sweats, weakness, and weight loss. Derm:  Denies changes in color of skin, dryness, flushing, insect bite(s), itching, lesion(s), poor wound healing, and rash. Heme:  Denies abnormal bruising, bleeding, enlarge lymph nodes, pallor, and skin discoloration.  Physical Exam  General:  alert, well-developed, well-nourished, well-hydrated, appropriate dress, normal appearance, healthy-appearing, cooperative to examination, good hygiene, and overweight-appearing.   Head:  normocephalic, atraumatic, no abnormalities observed, and no abnormalities palpated.   Ears:  R ear normal and L ear normal.   Nose:  no external  deformity, no airflow obstruction, no intranasal foreign body, no nasal polyps, no nasal mucosal lesions, no mucosal friability, no active bleeding or clots, no septum abnormalities, nasal dischargemucosal pallor, mucosal erythema, mucosal edema, L maxillary sinus tenderness, and R maxillary sinus tenderness.   Mouth:  Oral mucosa and oropharynx without lesions or exudates.  Teeth in good repair. Neck:  supple, full ROM, no masses, no thyromegaly, no thyroid nodules or tenderness, no JVD, normal carotid upstroke, no carotid bruits, and no cervical lymphadenopathy.   Lungs:  normal respiratory effort, no intercostal retractions, no accessory muscle use, normal breath sounds, no dullness, no fremitus, no crackles, and no wheezes.   Heart:  normal rate, regular rhythm, no murmur, no gallop, no rub, and no JVD.   Abdomen:  soft, non-tender, normal bowel sounds, no distention, no masses, no guarding, no rigidity, no rebound tenderness, no abdominal hernia, no inguinal hernia, no hepatomegaly, and no splenomegaly.   Msk:  normal ROM, no joint tenderness, no joint swelling, no joint warmth, no redness over joints, no joint deformities, no joint instability, and no crepitation.   Pulses:  R and L carotid,radial,femoral,dorsalis pedis and posterior tibial pulses are full and equal bilaterally Extremities:  No clubbing, cyanosis, edema, or deformity noted with normal full range of motion of all joints.   Neurologic:  No cranial nerve deficits noted. Station and gait are normal. Plantar reflexes are down-going bilaterally. DTRs are symmetrical throughout. Sensory, motor and coordinative functions appear intact. Skin:  turgor normal, color normal, no rashes, no suspicious lesions, no ecchymoses, no petechiae, no purpura, no ulcerations, and no edema.   Cervical Nodes:  no anterior cervical adenopathy and no posterior cervical adenopathy.   Axillary Nodes:  no R axillary adenopathy and no L axillary adenopathy.     Inguinal Nodes:  no R inguinal adenopathy and no L inguinal adenopathy.   Psych:  Cognition and judgment appear intact. Alert and cooperative with normal attention span and concentration. No apparent delusions, illusions, hallucinations   Impression & Recommendations:  Problem # 1:  SINUSITIS- ACUTE-NOS (ICD-461.9) Assessment New  His updated medication list for this problem includes:    Avelox 400 Mg Tabs (Moxifloxacin hcl) ..... One by mouth once daily for 10 days  Problem #  2:  COUGH (ICD-786.2) Assessment: New will look for pna, edema, etc. Orders: T-2 View CXR (71020TC)  Problem # 3:  LEUKOCYTOSIS UNSPECIFIED (ICD-288.60) Assessment: New  Orders: Venipuncture (28413) TLB-CBC Platelet - w/Differential (85025-CBCD) T-Food Allergy Profile Specific IgE (86003/[id number]) T-Allergy Profile Region [place], [place], MD, [place], [place] [phone])  Problem # 4:  ALLERGIC REACTION (ICD-995.3) Assessment: Improved  Orders: Venipuncture (10272) TLB-CBC Platelet - w/Differential (85025-CBCD) T-Food Allergy Profile Specific IgE (86003/[id number]) T-Allergy Profile Region [place], [place], MD, [place], [place] [phone])  Problem # 5:  HYPERTENSION (ICD-401.9) Assessment: Improved  His updated medication list for this problem includes:    Diovan 160 Mg Tabs (Valsartan) ..... One by mouth daily  BP today: 124/88 Prior BP: 120/78 (06/13/2010)  Prior 10 Yr Risk Heart Disease: 4 % (08/15/2008)  Labs Reviewed: K+: 4.0 (06/13/2010) Creat: : 0.9 (06/13/2010)   Chol: 162 (10/10/2009)   HDL: 38.40 (10/10/2009)   LDL: 108 (10/10/2009)   TG: 77.0 (10/10/2009)  Complete Medication List: 1)  Wellbutrin 75 Mg Tabs (Bupropion hcl) .... Take 1 tablet by mouth once a day follow-up appt is due no addtional refilsl until appt 2)  Concerta 36 Mg Tbcr (Methylphenidate hcl) .... Take 1 tablet by mouth once a day as needed 3)  Omeprazole 40 Mg Cpdr (Omeprazole) .... Take one 30-60 min before first and last meals of the day 4)   Proair Hfa 108 (90 Base) Mcg/act Aers (Albuterol sulfate) .Marland Kitchen.. 1-2 puffs qid as needed for sob and wheezing 5)  Diovan 160 Mg Tabs (Valsartan) .... One by mouth daily 6)  Avelox 400 Mg Tabs (Moxifloxacin hcl) .... One by mouth once daily for 10 days  Patient Instructions: 1)  Please schedule a follow-up appointment in 1 month. 2)  It is important that you exercise regularly at least 20 minutes 5 times a week. If you develop chest pain, have severe difficulty breathing, or feel very tired , stop exercising immediately and seek medical attention. 3)  You need to lose weight. Consider a lower calorie diet and regular exercise.  4)  Check your Blood Pressure regularly. If it is above 140/90: you should make an appointment. 5)  Take your antibiotic as prescribed until ALL of it is gone, but stop if you develop a rash or swelling and contact our office as soon as possible. 6)  Acute sinusitis symptoms for less than 10 days are not helped by antibiotics.Use warm moist compresses, and over the counter decongestants ( only as directed). Call if no improvement in 5-7 days, sooner if increasing pain, fever, or new symptoms. Prescriptions: AVELOX 400 MG TABS (MOXIFLOXACIN HCL) One by mouth once daily for 10 days  #10 x 0   Entered and Authorized by:   Etta Grandchild MD   Signed by:   Etta Grandchild MD on 06/30/2010   Method used:   Samples Given   RxID:   [id number]

## 2010-11-13 NOTE — Progress Notes (Signed)
Summary: REACTION - Seen in ER  Phone Note Call from Patient Call back at Home Phone [phone]   Summary of Call: Patient is requesting a call back regarding ER visit last night.  Initial call taken by: Lamar Sprinkles, CMA,  June 12, 2010 9:43 AM  Follow-up for Phone Call        left mess to call office back...............Marland KitchenLamar Sprinkles, CMA  June 12, 2010 3:09 PM    Pt was out last night and had a mixed drink. (ingredients were sugar, soda water, myers rum, fresh squeezed lemon juice) He c/o numbness in his lips and facial burning, itching & redness. Pt's friend who is a Engineer, civil (consulting) gave him benadryl & pepcid. He then started to have some chest pain and hot flashes. He was seen in ER and dx w/allergic reaction. Pt was given prednisone, pepcid and rx for itching. Also advised to continue benadryl for itching. He still contiues to have some itching. Advised if he continued to have symptoms tomorrow to come in for office visit. Also - if pt had severe symptoms to go to ER. Pt agreed.  He would like to discuss allergy testing to determine cause of reaction at next office visit w/Dr Yetta Barre.  Follow-up by: Lamar Sprinkles, CMA,  June 12, 2010 3:21 PM

## 2010-11-13 NOTE — Assessment & Plan Note (Signed)
Summary: allergic reaction,itching all over/cd   Vital Signs:  Patient profile:   25 year old male Height:      75 inches (190.50 cm) Weight:      389.13 pounds (176.88 kg) BMI:     48.81 O2 Sat:      97 % on Room air Temp:     98.1 degrees F (36.72 degrees C) oral Pulse rate:   90 / minute Pulse rhythm:   regular Resp:     16 per minute BP sitting:   120 / 78  (left arm) Cuff size:   large  Vitals Entered By: Lucious Groves CMA (June 13, 2010 3:40 PM)  Nutrition Counseling: Patient's BMI is greater than 25 and therefore counseled on weight management options.  O2 Flow:  Room air CC: C/O allergic reaction and itching all over since Wed./kb Is Patient Diabetic? No Pain Assessment Patient in pain? no      Comments Patient notes that it "comes in waves", he has a "hot feeling" and denies HA, SOB, and chest pain./kb   Primary Care Provider:  Sanda Linger, MD  CC:  C/O allergic reaction and itching all over since Wed./kb.  History of Present Illness: He returns c/o a 3 day hx. of itchy/burning skin but no rash. The symptoms began after he drank a mixed drink with rum in it. He went to the ER 2 days ago and was started on Prednisone.  Asthma History    Initial Asthma Severity Rating:    Age range: 12+ years    Symptoms: 0-2 days/week    Nighttime Awakenings: 0-2/month    Interferes w/ normal activity: no limitations    SABA use (not for EIB): 0-2 days/week    Asthma Severity Assessment: Intermittent   Preventive Screening-Counseling & Management  Alcohol-Tobacco     Alcohol drinks/day: <1     Alcohol type: all     >5/day in last 3 mos: no     Alcohol Counseling: to STOP drinking     Feels need to cut down: no     Feels annoyed by complaints: no     Feels guilty re: drinking: no     Needs 'eye opener' in am: no     Smoking Status: never     Passive Smoke Exposure: yes     Tobacco Counseling: not indicated; no tobacco use  Hep-HIV-STD-Contraception  Hepatitis Risk: no risk noted     HIV Risk: no risk noted     STD Risk: no risk noted      Sexual History:  not active.        Drug Use:  never.        Blood Transfusions:  no.    Current Medications (verified): 1)  Wellbutrin 75 Mg Tabs (Bupropion Hcl) .... Take 1 Tablet By Mouth Once A Day Follow-Up Appt Is Due No Addtional Refilsl Until Appt 2)  Concerta 36 Mg  Tbcr (Methylphenidate Hcl) .... Take 1 Tablet By Mouth Once A Day As Needed 3)  Omeprazole 40 Mg Cpdr (Omeprazole) .... Take One 30-60 Min Before First and Last Meals of The Day 4)  Hyoscyamine Sulfate Cr 0.375 Mg  Tb12 (Hyoscyamine Sulfate) .... Take 1 Tablet By Mouth Two Times A Day 5)  Cetirizine Hcl 10 Mg Tabs (Cetirizine Hcl) .Marland Kitchen.. 1po Once Daily As Needed 6)  Mytussin Ac 100-10 Mg/71ml Syrp (Guaifenesin-Codeine) .... 5-10 Ml By Mouth Qid As Needed For Cough 7)  Proair Hfa 108 (90 Base) Mcg/act  Aers (Albuterol Sulfate) .Marland Kitchen.. 1-2 Puffs Qid As Needed For Sob and Wheezing 8)  Diovan 160 Mg  Tabs (Valsartan) .... One By Mouth Daily 9)  Prednisone 10 Mg  Tabs (Prednisone) .... 4 Each Am X 2days, 2x2days, 1x2days and Stop  Allergies (verified): 1)  ! Septra 2)  ! Augmentin  Past History:  Past Medical History: Last updated: 12/12/2009 morbid obesity migraine headache attention deficit disorder anxiety/depression gastroesophageal reflux disease allergic rhinitis Irritable bowel syndrome Hypertension Allergic rhinitis Hyperlipidemia Cough    - onset 09/2009 in setting of ? 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 Both Parents smoke  Risk Factors: Alcohol Use: <1 (06/13/2010) >5 drinks/d w/in last 3 months: no (06/13/2010) Exercise: no (07/19/2008)  Risk Factors: Smoking Status: never (06/13/2010) Passive Smoke Exposure: yes (06/13/2010)  Family History: Reviewed history from 12/12/2009 and no changes required. Family History of Clotting disorder: Mother Family History of Heart Disease: Both Parents Families Lung CA- MGM  wheezing in mother with colds   Social History: Reviewed history from 12/12/2009 and no changes required. 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 Both Parents smoke HIV Risk:  no risk noted Drug Use:  never Blood Transfusions:  no  Review of Systems  The patient denies anorexia, fever, weight loss, chest pain, syncope, dyspnea on exertion, peripheral edema, prolonged cough, headaches, hemoptysis, abdominal pain, hematuria, suspicious skin lesions, transient blindness, enlarged lymph nodes, and angioedema.   General:  Denies chills, fatigue, fever, loss of appetite, malaise, sleep disorder, and sweats. Derm:  Complains of itching; denies changes in color of skin, changes in nail beds, dryness, excessive perspiration, flushing, hair loss, insect bite(s), lesion(s), poor wound healing, and rash.  Physical Exam  General:  alert, well-developed, well-nourished, well-hydrated, appropriate dress, normal appearance, healthy-appearing, cooperative to examination, good hygiene, and overweight-appearing.   Head:  normocephalic, atraumatic, no abnormalities observed, and no abnormalities palpated.   Eyes:  vision grossly intact, pupils equal, pupils round, pupils reactive to light, and no injection.   Ears:  R  ear normal and L ear normal.   Nose:  External nasal examination shows no deformity or inflammation. Nasal mucosa are pink and moist without lesions or exudates. Mouth:  Oral mucosa and oropharynx without lesions or exudates.  Teeth in good repair. Neck:  supple, full ROM, no masses, no thyromegaly, no thyroid nodules or tenderness, no JVD, normal carotid upstroke, no carotid bruits, and no cervical lymphadenopathy.   Lungs:  normal respiratory effort, no intercostal retractions, no accessory muscle use, normal breath sounds, no dullness, no fremitus, no crackles, and no wheezes.   Heart:  normal rate, regular rhythm, no murmur, no gallop, no rub, and no JVD.   Abdomen:  soft, non-tender, normal bowel sounds, no distention, no masses, no guarding, no rigidity, no rebound tenderness, no abdominal hernia, no inguinal hernia, no hepatomegaly, and no splenomegaly.   Msk:  normal ROM,  no joint tenderness, no joint swelling, no joint warmth, no redness over joints, no joint deformities, no joint instability, and no crepitation.   Pulses:  R and L carotid,radial,femoral,dorsalis pedis and posterior tibial pulses are full and equal bilaterally Extremities:  No clubbing, cyanosis, edema, or deformity noted with normal full range of motion of all joints.   Neurologic:  No cranial nerve deficits noted. Station and gait are normal. Plantar reflexes are down-going bilaterally. DTRs are symmetrical throughout. Sensory, motor and coordinative functions appear intact. Skin:  turgor normal, color normal, no rashes, no suspicious lesions, no ecchymoses, no petechiae, no purpura, no ulcerations, and no edema.   Cervical Nodes:  no anterior cervical adenopathy and no posterior cervical adenopathy.   Axillary Nodes:  no R axillary adenopathy and no L axillary adenopathy.   Inguinal Nodes:  no R inguinal adenopathy and no L inguinal adenopathy.   Psych:  Cognition and judgment appear intact. Alert and cooperative with  normal attention span and concentration. No apparent delusions, illusions, hallucinations   Impression & Recommendations:  Problem # 1:  ALLERGIC REACTION (ICD-995.3) Assessment New I will look for secondary causes and treat with Vistaril as needed , he will stop all alcohol Orders: Venipuncture (91478) TLB-BMP (Basic Metabolic Panel-BMET) (80048-METABOL) TLB-CBC Platelet - w/Differential (85025-CBCD) TLB-Hepatic/Liver Function Pnl (80076-HEPATIC) TLB-TSH (Thyroid Stimulating Hormone) (84443-TSH) TLB-Sedimentation Rate (ESR) (85652-ESR) T-Food Allergy Profile Specific IgE (86003/[address]) T-Allergy Profile Region II-DC, [place], [place], [place], [place] (5484)  Problem # 2:  DYSMETABOLIC SYNDROME (ICD-277.7) Assessment: Deteriorated  Orders: Venipuncture (29562) TLB-BMP (Basic Metabolic Panel-BMET) (80048-METABOL) TLB-CBC Platelet - w/Differential (85025-CBCD) TLB-Hepatic/Liver Function Pnl (80076-HEPATIC) TLB-TSH (Thyroid Stimulating Hormone) (84443-TSH) TLB-Sedimentation Rate (ESR) (85652-ESR) T-Food Allergy Profile Specific IgE (86003/[id number]) T-Allergy Profile Region [place], [place], [place], [place], [place] [phone])  Problem # 3:  MORBID OBESITY (ICD-278.01) Assessment: Unchanged  Ht: 75 (06/13/2010)   Wt: 389.13 (06/13/2010)   BMI: 48.81 (06/13/2010)  Problem # 4:  ASTHMA, UNSPECIFIED, UNSPECIFIED STATUS (ICD-493.90) Assessment: Unchanged  His updated medication list for this problem includes:    Proair Hfa 108 (90 Base) Mcg/act Aers (Albuterol sulfate) .Marland Kitchen... 1-2 puffs qid as needed for sob and wheezing    Prednisone 10 Mg Tabs (Prednisone) .Marland KitchenMarland KitchenMarland KitchenMarland Kitchen 4 each am x 2days, 2x2days, 1x2days and stop  Orders: Venipuncture (65784) TLB-BMP (Basic Metabolic Panel-BMET) (80048-METABOL) TLB-CBC Platelet - w/Differential (85025-CBCD) TLB-Hepatic/Liver Function Pnl (80076-HEPATIC) TLB-TSH (Thyroid Stimulating Hormone) (84443-TSH) TLB-Sedimentation Rate (ESR) (85652-ESR) T-Food Allergy Profile Specific IgE  (86003/[id number]) T-Allergy Profile Region [place], [place], [place], [place], [place] [phone])  Pulmonary Functions Reviewed: O2 sat: 97 (06/13/2010)  Problem # 5:  HYPERTENSION (ICD-401.9) Assessment: Improved  His updated medication list for this problem includes:    Diovan 160 Mg Tabs (Valsartan) ..... One by mouth daily  Orders: Venipuncture (95284) TLB-BMP (Basic Metabolic Panel-BMET) (80048-METABOL) TLB-CBC Platelet - w/Differential (85025-CBCD) TLB-Hepatic/Liver Function Pnl (80076-HEPATIC) TLB-TSH (Thyroid Stimulating Hormone) (84443-TSH) TLB-Sedimentation Rate (ESR) (85652-ESR) T-Food Allergy Profile Specific IgE (86003/[address]) T-Allergy Profile Region II-[place], [place], [place], [place], [place] (5484)  BP today: 120/78 Prior BP: 136/98 (12/12/2009)  Prior 10 Yr Risk Heart Disease: 4 % (08/15/2008)  Labs Reviewed: K+: 4.1 (10/10/2009) Creat: : 1.0 (10/10/2009)   Chol: 162 (10/10/2009)   HDL: 38.40 (10/10/2009)   LDL: 108 (10/10/2009)   TG: 77.0 (10/10/2009)  Complete Medication List: 1)  Wellbutrin 75 Mg Tabs (Bupropion hcl) .... Take 1 tablet by mouth once a day follow-up appt is due no addtional refilsl until appt 2)  Concerta 36 Mg Tbcr (Methylphenidate hcl) .... Take 1  tablet by mouth once a day as needed 3)  Omeprazole 40 Mg Cpdr (Omeprazole) .... Take one 30-60 min before first and last meals of the day 4)  Hyoscyamine Sulfate Cr 0.375 Mg Tb12 (Hyoscyamine sulfate) .... Take 1 tablet by mouth two times a day 5)  Proair Hfa 108 (90 Base) Mcg/act Aers (Albuterol sulfate) .Marland Kitchen.. 1-2 puffs qid as needed for sob and wheezing 6)  Diovan 160 Mg Tabs (Valsartan) .... One by mouth daily 7)  Prednisone 10 Mg Tabs (Prednisone) .... 4 each am x 2days, 2x2days, 1x2days and stop 8)  Vistaril 25 Mg Caps (Hydroxyzine pamoate) .Marland Kitchen.. 1-2 by mouth qid as needed for itching  Patient Instructions: 1)  Please schedule a follow-up appointment in 2 weeks. 2)  Check your Blood Pressure regularly. If it is above 140/90: you  should make an appointment. 3)  It is important that you exercise regularly at least 20 minutes 5 times a week. If you develop chest pain, have severe difficulty breathing, or feel very tired , stop exercising immediately and seek medical attention. 4)  You need to lose weight. Consider a lower calorie diet and regular exercise.  Prescriptions: VISTARIL 25 MG CAPS (HYDROXYZINE PAMOATE) 1-2 by mouth QID as needed for itching  #35 x 0   Entered and Authorized by:   Etta Grandchild MD   Signed by:   Etta Grandchild MD on 06/13/2010   Method used:   [facility] to        [facility]  [address] [phone] (retail)       [address]       [place], [place]  [address]       Ph: [phone]       Fax: [phone]   RxID:   [id number]

## 2010-11-13 NOTE — Assessment & Plan Note (Signed)
Summary: Pulmonary/ new pt eval for cough   Visit Type:  Initial Consult Copy to:  Self Primary Provider/Referring Provider:  Sanda Linger, MD  CC:  Hemoptysis.  History of Present Illness: 25 yowm with h/o childhood asthma seen by [facility] with dx of ? pna with neg allergies at age 25 eventually improved with no need for chronic rx.  December 12, 2009 cc dx of asthma in teen years on a neb couple of times a month and has not refilled it in years then xmas 2010 abruptly he and gmother cough, ha, fever,  generalized ant sharp chest pain worse with cough rx with avelox and tussicaps seemed some better then worse then another round of abx for a total of 21 avelox then 3 zpak some better, cough maybe some better on proaire.and came for pft's today off it.  At one point developed cough with streaky hemoptysis and slt  bloody nasal discharge then resolved after rx with abx. Still scratching throat and mild dysphagia.  cough worse as day goes on esp in evening assoc with ha does not exac early in am or wake him up at night.  At day of ov denied any  itching, sneezing,  nasal congestion or excess secretions,  fever, chills, sweats, unintended wt loss, pleuritic or exertional cp, hempoptysis, change in activity tolerance  orthopnea pnd or leg swelling.  Pt also denies any obvious fluctuation in symptoms with weather or environmental change or other alleviating or aggravating factors.       Past History:  Past Medical History: morbid obesity migraine headache attention deficit disorder anxiety/depression gastroesophageal reflux disease allergic rhinitis Irritable bowel syndrome Hypertension Allergic rhinitis Hyperlipidemia Cough    - onset 09/2009 in setting of ? 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 Both Parents smoke Passive Smoke Exposure:  yes  Review of Systems       The patient complains of shortness of breath with activity, shortness of breath at rest, productive cough, non-productive cough, coughing up blood, irregular heartbeats, difficulty swallowing, sore throat, headaches, nasal congestion/difficulty breathing through nose, anxiety, joint stiffness or pain, and change in color of mucus.  The patient denies chest pain, acid heartburn, indigestion, loss of appetite, weight change, abdominal pain, tooth/dental problems, sneezing, itching, ear ache, depression, hand/feet swelling, rash, and fever.    Vital Signs:  Patient profile:   25 year old male Weight:      362 pounds O2 Sat:      98 % on Room air Temp:     99.3 degrees F oral Pulse rate:   110 / minute BP sitting:   136 / 98  (left arm) Cuff size:   large  Vitals Entered By: Vernie Murders (December 12, 2009 2:35 PM)  O2 Flow:  Room air  Physical Exam  Additional Exam:  wt 362 December 12, 2009 amb very pleasant wm nad HEENT: nl dentition, turbinates, and orophanx. Nl external ear canals without cough reflex NECK :  without JVD/Nodes/TM/ nl carotid upstrokes bilaterally LUNGS: no acc muscle use, clear  to A and P bilaterally without cough on insp or exp maneuvers CV:  RRR  no s3 or murmur or increase in P2, no edema  ABD:  soft and nontender with nl excursion in the supine position. No bruits or organomegaly, bowel sounds nl MS:  warm without deformities, calf tenderness, cyanosis or clubbing SKIN: warm and dry without lesions   NEURO:  alert, approp, no deficits      CT of Sinus  Procedure date:  12/13/2009  Findings:        Findings: There is  mucosal thickening involving the sphenoid sinus and the maxillary sinuses.  No air-fluid levels.  Bony sinus walls are intact.  Nasal turbinates symmetrical.  Mild nasal septal deviation to the right.   IMPRESSION: Findings compatible chronic sinusitis involving the sphenoid and maxillary sinuses.  No acute findings  CXR  Procedure date:  12/02/2009  Findings:      Comparison: 10/10/2009   Findings: Trachea is midline.  Heart size normal.  Lungs are clear. No pleural fluid.   IMPRESSION: No acute findings.  Impression & Recommendations:  Problem # 1:  COUGH (ICD-786.2) The most common causes of chronic cough in immunocompetent adults include: upper airway cough syndrome (UACS), previously referred to as postnasal drip syndrome,  caused by variety of rhinosinus conditions; (2) asthma; (3) GERD; (4) chronic bronchitis from cigarette smoking or other inhaled environmental irritants; (5) nonasthmatic eosinophilic bronchitis; and (6) bronchiectasis. These conditions, singly or in combination, have accounted for up to 94% of the causes of chronic cough in prospective studies.   Most likely this represents Upper airway cough syndrome, so named because it's frequently impossible to sort out how much is LPR vs CR/sinusitis with freq throat clearing generating secondary extra esophageal GERD from wide swings in gastric pressure that occur with throat clearing, promoting self use of mint and menthol lozenges that reduce the lower esophageal sphincter tone and exacerbate the problem further.  These symptoms are easily confused with asthma/copd by even experienced pulmonogists because they overlap so much. These are the same pts who not infrequently have failed to tolerate ace inhibitors,  dry powder inhalers or biphosphonates or report having reflux symptoms that don't respond to standard doses of PPI   Rec starting with aggressive rx of reflux  The standardized cough guidelines recently published  in Chest are a 14 step process, not a single office visit,  and are intended  to address this problem logically,  with an alogrithm dependent on response to each progressive step  to determine a specific diagnosis with  minimal addtional testing needed. Therefore if compliance is an issue this empiric standardized approach simply won't work.   Problem # 2:  HYPERTENSION (ICD-401.9)  Anecdotal resports of cough on losartan but not other arb's, will try off for short term anyway.   The following medications were removed from the medication list:    Losartan Potassium 100 Mg Tabs (Losartan potassium) ..... Once daily for high blood pressure His updated medication list for this problem includes:    Diovan 160 Mg Tabs (Valsartan) ..... One by mouth daily  Orders: Consultation Level V (01093)  Problem # 3:  ALLERGIC RHINITIS (ICD-477.9)  The following medications were removed from the medication list:    Omnaris 50 Mcg/act Susp (Ciclesonide) .Marland Kitchen... 2 spray/side once daily His updated medication list for this problem includes:    Cetirizine Hcl 10 Mg Tabs (Cetirizine hcl) .Marland Kitchen... 1po once daily as needed  Hemoptysis was likely epistaxis with  bloody pnds, may need to readdress this if symptoms persist, perhaps by Dr Maple Hudson,  but  no role for ent based on sinus ct  Orders: Consultation Level V (16109)  Problem # 4:  ASTHMA, UNSPECIFIED, UNSPECIFIED STATUS (ICD-493.90) Does appeary to have intermittent asthma but All goals of asthma met including optimal function and elimination of symptoms with minimum need for rescue therapy. Contingencies discussed today including the rule of two's.   Medications Added to Medication List This Visit: 1)  Omeprazole 40 Mg Cpdr (Omeprazole) .... Take one 30-60 min before first and last meals of the day 2)  Diovan 160 Mg Tabs (Valsartan) .... One by mouth daily 3)  Prednisone 10 Mg Tabs (Prednisone) .... 4 each am x 2days, 2x2days, 1x2days and stop  Other  Orders: Misc. Referral (Misc. Ref)  Patient Instructions: 1)  Stop losartan 2)  start diovan 160 one daily 3)  Prliosec Take one 30-60 min before first and last meals of the day 4)  and pepcid 20 mg one at betime 5)  Prednsione 10mg  4 each am x 2days, 2x2days, 1x2days and stop 6)  GERD (REFLUX)  is a common cause of respiratory symptoms. It commonly presents without heartburn and can be treated with medication, but also with lifestyle changes including avoidance of late meals, excessive alcohol, smoking cessation, and avoid fatty foods, chocolate, peppermint, colas, red wine, and acidic juices such as orange juice. NO MINT OR MENTHOL PRODUCTS SO NO COUGH DROPS  7)  USE SUGARLESS CANDY INSTEAD (jolley ranchers)  8)  NO OIL BASED VITAMINS  9)  See Patient Care Coordinator before leaving for sinus ct  10)  Please schedule a follow-up appointment in 2 weeks, sooner if needed  Prescriptions: PREDNISONE 10 MG  TABS (PREDNISONE) 4 each am x 2days, 2x2days, 1x2days and stop  #14 x 0   Entered and Authorized by:   Nyoka Cowden MD   Signed by:   Nyoka Cowden MD on 12/12/2009   Method used:   [facility]        [facility]  [address] [phone][facility] (retail)       [address]       [place], [place]  [address]       Ph: [phone]       Fax: [phone]   RxID:   [id number]

## 2010-11-13 NOTE — Assessment & Plan Note (Signed)
Summary: still not feeling well, #/cd   Vital Signs:  Patient profile:   25 year old Alexis [name] Height:      75 inches Weight:      366 pounds BMI:     45.91 O2 Sat:      98 % on Room air Temp:     98.5 degrees F oral Pulse rate:   90 / minute Pulse rhythm:   regular Resp:     16 per minute BP sitting:   134 / 88  (left arm) Cuff size:   large  Vitals Entered By: Rock Nephew CMA (October 25, 2009 2:20 PM)  O2 Flow:  Room air  Primary Care Provider:  Sanda Linger, MD  CC:  URI symptoms.  History of Present Illness:  URI Symptoms      This is a 25 year old man who presents with URI symptoms.  The symptoms began 2 weeks ago.  The severity is described as moderate.  The patient reports nasal congestion and purulent nasal discharge, but denies sore throat, dry cough, productive cough, earache, and sick contacts.  The patient denies fever, stiff neck, dyspnea, wheezing, rash, vomiting, diarrhea, and use of an antipyretic.  The patient also reports itchy throat, sneezing, and seasonal symptoms.  The patient denies headache and muscle aches.  Risk factors for Strep sinusitis include unilateral facial pain, unilateral nasal discharge, poor response to decongestant, double sickening, and tooth pain.  The patient denies the following risk factors for Strep sinusitis: tender adenopathy and absence of cough.  He was improving on the Avelox but did not continue with the refill. Symptoms retruned about 2-3 days after his last dose of Avelox.  Hypertension History:      He denies headache, chest pain, palpitations, dyspnea with exertion, orthopnea, peripheral edema, visual symptoms, neurologic problems, syncope, and side effects from treatment.  He notes no problems with any antihypertensive medication side effects.        Positive major cardiovascular risk factors include hyperlipidemia and hypertension.  Negative major cardiovascular risk factors include Alexis [name] age less than 39 years old, no history of  diabetes, negative family history for ischemic heart disease, and non-tobacco-user status.        Further assessment for target organ damage reveals no history of ASHD, cardiac end-organ damage (CHF/LVH), stroke/TIA, peripheral vascular disease, renal insufficiency, or hypertensive retinopathy.    Lipid Management History:      Positive NCEP/ATP III risk factors include HDL cholesterol less than 40 and hypertension.  Negative NCEP/ATP III risk factors include Alexis [name] age less than 54 years old, non-diabetic, no family history for ischemic heart disease, non-tobacco-user status, no ASHD (atherosclerotic heart disease), no prior stroke/TIA, no peripheral vascular disease, and no history of aortic aneurysm.        The patient states that he knows about the "Therapeutic Lifestyle Change" diet.  His compliance with the TLC diet is not at all.  The patient expresses understanding of adjunctive measures for cholesterol lowering.  Adjunctive measures started by the patient include aerobic exercise, fiber, ASA, and limit alcohol consumpton.      Current Medications (verified): 1)  Wellbutrin 75 Mg Tabs (Bupropion Hcl) .... Take 1 Tablet By Mouth Once A Day Follow-Up Appt Is Due No Addtional Refilsl Until Appt 2)  Concerta 36 Mg  Tbcr (Methylphenidate Hcl) .... Take 1 Tablet By Mouth Once A Day As Needed 3)  Xanax 0.5 Mg  Tabs (Alprazolam) .... Take 1 Tablet By Mouth Once A Day  As Needed 4)  Omeprazole 40 Mg Cpdr (Omeprazole) .Marland Kitchen.. 1 Once Daily/stop Zegerid 5)  Hyoscyamine Sulfate 0.125 Mg Subl (Hyoscyamine Sulfate) .... As Needed For Cramping 6)  Hyoscyamine Sulfate Cr 0.375 Mg  Tb12 (Hyoscyamine Sulfate) .... Take 1 Tablet By Mouth Two Times A Day 7)  Cetirizine Hcl 10 Mg Tabs (Cetirizine Hcl) .Marland Kitchen.. 1po Once Daily As Needed 8)  Omnaris 50 Mcg/act Susp (Ciclesonide) .... 2 Spray/side Once Daily 9)  Losartan Potassium 100 Mg Tabs (Losartan Potassium) .... Once Daily For High Blood Pressure 10)  Tussicaps 10-8 Mg  Xr12h-Cap (Hydrocod Polst-Chlorphen Polst) .... One By Mouth Two Times A Day As Needed For Cough  Allergies (verified): 1)  ! Septra 2)  ! Augmentin  Past History:  Past Medical History: Reviewed history from 10/10/2009 and no changes required. morbid obesity migraine headache attention deficit disorder anxiety/depression gastroesophageal reflux disease allergic rhinitis Irritable bowel syndrome Hypertension Allergic rhinitis Hyperlipidemia  Past Surgical History: Reviewed history from 09/13/2007 and no changes required. right ankle surgery due to fracture Appendectomy  Family History: Reviewed history from 03/20/2009 and no changes required. Family History of Clotting disorder: Mother Family History of Heart Disease: Both Parents Families  Social History: Reviewed history from 07/19/2008 and no changes required. Occupation:  works [employer] Never Smoked Alcohol use-yes Drug use-no Regular exercise-no  Review of Systems       The patient complains of weight gain.  The patient denies anorexia, fever, weight loss, chest pain, syncope, peripheral edema, headaches, hemoptysis, abdominal pain, melena, hematochezia, severe indigestion/heartburn, hematuria, difficulty walking, depression, enlarged lymph nodes, and angioedema.   GI:  Denies abdominal pain, change in bowel habits, gas, indigestion, loss of appetite, nausea, vomiting, vomiting blood, and yellowish skin color.  Physical Exam  General:  mild distress with an intermittent cough.  alert, well-developed, well-nourished, well-hydrated, appropriate dress, cooperative to examination, good hygiene, overweight-appearing, and mild distress.   Head:  normocephalic, atraumatic, no abnormalities observed, and no abnormalities palpated.   Ears:  R ear normal and L ear normal.   Nose:  nasal discharge, mucosal erythema, and mucosal edema.  no airflow obstruction, no intranasal foreign body, no nasal polyps, no nasal mucosal  lesions, no mucosal friability, no active bleeding or clots, no septum abnormalities, nasal dischargemucosal pallor, mucosal erythema, mucosal edema, L maxillary sinus tenderness, and R maxillary sinus tenderness.   Mouth:  Oral mucosa and oropharynx without lesions or exudates.  Teeth in good repair. Neck:  supple and no masses.  no thyromegaly, no JVD, no cervical lymphadenopathy, and no neck tenderness.   Lungs:  normal respiratory effort, no intercostal retractions, no accessory muscle use, normal breath sounds, no dullness, no fremitus, no crackles, and no wheezes.   Heart:  normal rate, regular rhythm, no murmur, no gallop, no rub, and no JVD.   Abdomen:  soft, non-tender, normal bowel sounds, no distention, no masses, no guarding, no rigidity, no hepatomegaly, and no splenomegaly.   Msk:  normal ROM, no joint tenderness, no joint swelling, no joint warmth, and no redness over joints.   Pulses:  R and L carotid,radial,femoral,dorsalis pedis and posterior tibial pulses are full and equal bilaterally Extremities:  No clubbing, cyanosis, edema, or deformity noted with normal full range of motion of all joints.   Neurologic:  No cranial nerve deficits noted. Station and gait are normal. Plantar reflexes are down-going bilaterally. DTRs are symmetrical throughout. Sensory, motor and coordinative functions appear intact. Skin:  turgor normal, color normal, no rashes, no suspicious  lesions, no ecchymoses, no petechiae, no purpura, no ulcerations, and no edema.   Cervical Nodes:  no anterior cervical adenopathy and no posterior cervical adenopathy.   Axillary Nodes:  no R axillary adenopathy and no L axillary adenopathy.   Psych:  Oriented X3, memory intact for recent and remote, normally interactive, good eye contact, not anxious appearing, not depressed appearing, not agitated, and not suicidal.     Impression & Recommendations:  Problem # 1:  ALLERGIC RHINITIS (ICD-477.9) Assessment  Deteriorated  His updated medication list for this problem includes:    Cetirizine Hcl 10 Mg Tabs (Cetirizine hcl) .Marland Kitchen... 1po once daily as needed    Omnaris 50 Mcg/act Susp (Ciclesonide) .Marland Kitchen... 2 spray/side once daily  Problem # 2:  SINUSITIS- ACUTE-NOS (ICD-461.9) Assessment: New  The following medications were removed from the medication list:    Avelox Abc Pack 400 Mg Tabs (Moxifloxacin hcl) ..... One by mouth once daily for 10 days His updated medication list for this problem includes:    Omnaris 50 Mcg/act Susp (Ciclesonide) .Marland Kitchen... 2 spray/side once daily    Tussicaps 10-8 Mg Xr12h-cap (Hydrocod polst-chlorphen polst) ..... One by mouth two times a day as needed for cough    Avelox 400 Mg Tabs (Moxifloxacin hcl) ..... One by mouth once daily for 14 days  Instructed on treatment. Call if symptoms persist or worsen.   Orders: Admin of Therapeutic Inj  intramuscular or subcutaneous (04540) Depo- Medrol 40mg  (J1030) Depo- Medrol 80mg  (J1040)  Problem # 3:  IBS (ICD-564.1) Assessment: Unchanged continue Hyoscyamine  Problem # 4:  ASTHMA, UNSPECIFIED, UNSPECIFIED STATUS (ICD-493.90) Assessment: Improved  Problem # 5:  HYPERLIPIDEMIA (ICD-272.4) Assessment: Improved  Labs Reviewed: SGOT: 27 (10/10/2009)   SGPT: 35 (10/10/2009)  Prior 10 Yr Risk Heart Disease: 4 % (08/15/2008)   HDL:38.40 (10/10/2009), 34.7 (03/08/2007)  LDL:108 (10/10/2009), 128 (03/08/2007)  Chol:162 (10/10/2009), 182 (03/08/2007)  Trig:77.0 (10/10/2009), 95 (03/08/2007)  Problem # 6:  HYPERTENSION (ICD-401.9) Assessment: Improved  His updated medication list for this problem includes:    Losartan Potassium 100 Mg Tabs (Losartan potassium) ..... Once daily for high blood pressure  BP today: 134/88 Prior BP: 128/82 (10/10/2009)  Prior 10 Yr Risk Heart Disease: 4 % (08/15/2008)  Labs Reviewed: K+: 4.1 (10/10/2009) Creat: : 1.0 (10/10/2009)   Chol: 162 (10/10/2009)   HDL: 38.40 (10/10/2009)   LDL: 108  (10/10/2009)   TG: 77.0 (10/10/2009)  Problem # 7:  DYSMETABOLIC SYNDROME (ICD-277.7) Assessment: New  Complete Medication List: 1)  Wellbutrin 75 Mg Tabs (Bupropion hcl) .... Take 1 tablet by mouth once a day follow-up appt is due no addtional refilsl until appt 2)  Concerta 36 Mg Tbcr (Methylphenidate hcl) .... Take 1 tablet by mouth once a day as needed 3)  Xanax 0.5 Mg Tabs (Alprazolam) .... Take 1 tablet by mouth once a day as needed 4)  Omeprazole 40 Mg Cpdr (Omeprazole) .Marland Kitchen.. 1 once daily/stop zegerid 5)  Hyoscyamine Sulfate 0.125 Mg Subl (Hyoscyamine sulfate) .... As needed for cramping 6)  Hyoscyamine Sulfate Cr 0.375 Mg Tb12 (Hyoscyamine sulfate) .... Take 1 tablet by mouth two times a day 7)  Cetirizine Hcl 10 Mg Tabs (Cetirizine hcl) .Marland Kitchen.. 1po once daily as needed 8)  Omnaris 50 Mcg/act Susp (Ciclesonide) .... 2 spray/side once daily 9)  Losartan Potassium 100 Mg Tabs (Losartan potassium) .... Once daily for high blood pressure 10)  Tussicaps 10-8 Mg Xr12h-cap (Hydrocod polst-chlorphen polst) .... One by mouth two times a day as needed for cough  11)  Avelox 400 Mg Tabs (Moxifloxacin hcl) .... One by mouth once daily for 14 days  Hypertension Assessment/Plan:      The patient's hypertensive risk group is category B: At least one risk factor (excluding diabetes) with no target organ damage.  His calculated 10 year risk of coronary heart disease is 4 %.  Today's blood pressure is 134/88.  His blood pressure goal is < 140/90.  Lipid Assessment/Plan:      Based on NCEP/ATP III, the patient's risk factor category is "2 or more risk factors and a calculated 10 year CAD risk of < 20%".  The patient's lipid goals are as follows: Total cholesterol goal is 200; LDL cholesterol goal is 130; HDL cholesterol goal is 40; Triglyceride goal is 150.    Patient Instructions: 1)  Please schedule a follow-up appointment in 2 weeks. 2)  It is important that you exercise regularly at least 20 minutes 5  times a week. If you develop chest pain, have severe difficulty breathing, or feel very tired , stop exercising immediately and seek medical attention. 3)  You need to lose weight. Consider a lower calorie diet and regular exercise.  4)  Check your Blood Pressure regularly. If it is above 140/90: you should make an appointment. Prescriptions: AVELOX 400 MG TABS (MOXIFLOXACIN HCL) One by mouth once daily for 14 days  #14 x 1   Entered and Authorized by:   Etta Grandchild MD   Signed by:   Etta Grandchild MD on 10/25/2009   Method used:   Print then Give to Patient   RxID:   [id number] LOSARTAN POTASSIUM 100 MG TABS (LOSARTAN POTASSIUM) once daily for high blood pressure  #90 x 3   Entered and Authorized by:   Etta Grandchild MD   Signed by:   Etta Grandchild MD on 10/25/2009   Method used:   Print then Give to Patient   RxID:   [id number] OMNARIS 50 MCG/ACT SUSP (CICLESONIDE) 2 spray/side once daily  #90 days x 3   Entered and Authorized by:   Etta Grandchild MD   Signed by:   Etta Grandchild MD on 10/25/2009   Method used:   Print then Give to Patient   RxID:   [id number] CETIRIZINE HCL 10 MG TABS (CETIRIZINE HCL) 1po once daily as needed  #90 x 3   Entered and Authorized by:   Etta Grandchild MD   Signed by:   Etta Grandchild MD on 10/25/2009   Method used:   Print then Give to Patient   RxID:   [id number] HYOSCYAMINE SULFATE CR 0.375 MG  TB12 (HYOSCYAMINE SULFATE) Take 1 tablet by mouth two times a day  #180 x 3   Entered and Authorized by:   Etta Grandchild MD   Signed by:   Etta Grandchild MD on 10/25/2009   Method used:   Print then Give to Patient   RxID:   [id number] OMEPRAZOLE 40 MG CPDR (OMEPRAZOLE) 1 once daily/STOP ZEGERID  #90 Capsule x 3   Entered and Authorized by:   Etta Grandchild MD   Signed by:   Etta Grandchild MD on 10/25/2009   Method used:   Print then Give to Patient   RxID:   [id number] WELLBUTRIN 75 MG TABS (BUPROPION HCL)  Take 1 tablet by mouth once a day FOLLOW-UP APPT IS DUE NO ADDTIONAL REFILSL UNTIL APPT  #90 x 3   Entered and Authorized  by:   Etta Grandchild MD   Signed by:   Etta Grandchild MD on 10/25/2009   Method used:   Print then Give to Patient   RxID:   [id number] Prudy Feeler 0.5 MG  TABS (ALPRAZOLAM) Take 1 tablet by mouth once a day as needed  #90 x 1   Entered and Authorized by:   Etta Grandchild MD   Signed by:   Etta Grandchild MD on 10/25/2009   Method used:   Print then Give to Patient   RxID:   [id number]    Medication Administration  Injection # 1:    Medication: Depo- Medrol 80mg     Diagnosis: SINUSITIS- ACUTE-NOS (ICD-461.9)    Route: IM    Site: R deltoid    Exp Date: 07/2010    Lot #: [id number]    Mfr: [email or web address]    Patient tolerated injection without complications    Given by: Rock Nephew CMA (October 25, 2009 5:16 PM)  Injection # 2:    Medication: Depo- Medrol 40mg     Diagnosis: SINUSITIS- ACUTE-NOS (ICD-461.9)    Route: IM    Site: R deltoid    Exp Date: 07/2010    Lot #: [id number]    Mfr: [email or web address]    Patient tolerated injection without complications    Given by: Rock Nephew CMA (October 25, 2009 5:16 PM)  Orders Added: 1)  Est. Patient Level V [01027] 2)  Admin of Therapeutic Inj  intramuscular or subcutaneous [96372] 3)  Depo- Medrol 40mg  [J1030] 4)  Depo- Medrol 80mg  [J1040]

## 2010-11-13 NOTE — Assessment & Plan Note (Signed)
Summary: FEVER/ SINUS/ SIDE DOOR/NWS   Vital Signs:  Patient profile:   25 year old male Height:      75 inches Weight:      362 pounds BMI:     45.41 O2 Sat:      96 % on Room air Temp:     99.3 degrees F oral Pulse rate:   88 / minute Pulse rhythm:   regular Resp:     16 per minute BP sitting:   120 / 88  (left arm) Cuff size:   large  Vitals Entered By: Rock Nephew CMA (December 02, 2009 3:33 PM)  Nutrition Counseling: Patient's BMI is greater than 25 and therefore counseled on weight management options.  O2 Flow:  Room air CC: fever, congestion x 3 days, URI symptoms Is Patient Diabetic? No   Primary Care Provider:  Sanda Linger, MD  CC:  fever, congestion x 3 days, and URI symptoms.  History of Present Illness:  URI Symptoms      This is a 25 year old man who presents with URI symptoms.  The symptoms began 3 days ago.  The severity is described as moderate.  The patient reports nasal congestion, purulent nasal discharge, sore throat, productive cough, and sick contacts, but denies earache.  Associated symptoms include low-grade fever (<100.5 degrees).  The patient denies stiff neck, dyspnea, wheezing, rash, vomiting, diarrhea, and use of an antipyretic.  The patient denies headache, muscle aches, and severe fatigue.  Risk factors for Strep sinusitis include unilateral facial pain, unilateral nasal discharge, poor response to decongestant, and double sickening.  The patient denies the following risk factors for Strep sinusitis: tender adenopathy and absence of cough.    Preventive Screening-Counseling & Management  Alcohol-Tobacco     Alcohol drinks/day: <1     Smoking Status: never     Passive Smoke Exposure: no  Hep-HIV-STD-Contraception     Hepatitis Risk: no risk noted     HIV Risk: no     STD Risk: no risk noted      Drug Use:  no.    Medications Prior to Update: 1)  Wellbutrin 75 Mg Tabs (Bupropion Hcl) .... Take 1 Tablet By Mouth Once A Day Follow-Up  Appt Is Due No Addtional Refilsl Until Appt 2)  Concerta 36 Mg  Tbcr (Methylphenidate Hcl) .... Take 1 Tablet By Mouth Once A Day As Needed 3)  Xanax 0.5 Mg  Tabs (Alprazolam) .... Take 1 Tablet By Mouth Once A Day As Needed 4)  Omeprazole 40 Mg Cpdr (Omeprazole) .Marland Kitchen.. 1 Once Daily/stop Zegerid 5)  Hyoscyamine Sulfate 0.125 Mg Subl (Hyoscyamine Sulfate) .... As Needed For Cramping 6)  Hyoscyamine Sulfate Cr 0.375 Mg  Tb12 (Hyoscyamine Sulfate) .... Take 1 Tablet By Mouth Two Times A Day 7)  Cetirizine Hcl 10 Mg Tabs (Cetirizine Hcl) .Marland Kitchen.. 1po Once Daily As Needed 8)  Omnaris 50 Mcg/act Susp (Ciclesonide) .... 2 Spray/side Once Daily 9)  Losartan Potassium 100 Mg Tabs (Losartan Potassium) .... Once Daily For High Blood Pressure 10)  Tussicaps 10-8 Mg Xr12h-Cap (Hydrocod Polst-Chlorphen Polst) .... One By Mouth Two Times A Day As Needed For Cough 11)  Avelox 400 Mg Tabs (Moxifloxacin Hcl) .... One By Mouth Once Daily For 14 Days  Current Medications (verified): 1)  Wellbutrin 75 Mg Tabs (Bupropion Hcl) .... Take 1 Tablet By Mouth Once A Day Follow-Up Appt Is Due No Addtional Refilsl Until Appt 2)  Concerta 36 Mg  Tbcr (Methylphenidate Hcl) .... Take  1 Tablet By Mouth Once A Day As Needed 3)  Xanax 0.5 Mg  Tabs (Alprazolam) .... Take 1 Tablet By Mouth Once A Day As Needed 4)  Omeprazole 40 Mg Cpdr (Omeprazole) .Marland Kitchen.. 1 Once Daily/stop Zegerid 5)  Hyoscyamine Sulfate 0.125 Mg Subl (Hyoscyamine Sulfate) .... As Needed For Cramping 6)  Hyoscyamine Sulfate Cr 0.375 Mg  Tb12 (Hyoscyamine Sulfate) .... Take 1 Tablet By Mouth Two Times A Day 7)  Cetirizine Hcl 10 Mg Tabs (Cetirizine Hcl) .Marland Kitchen.. 1po Once Daily As Needed 8)  Omnaris 50 Mcg/act Susp (Ciclesonide) .... 2 Spray/side Once Daily 9)  Losartan Potassium 100 Mg Tabs (Losartan Potassium) .... Once Daily For High Blood Pressure 10)  Zithromax Tri-Pak 500 Mg Tab (Azithromycin) .... Take One By Mouth Once Daily For 3 Days 11)  Mytussin Ac 100-10 Mg/58ml  Syrp (Guaifenesin-Codeine) .... 5-10 Ml By Mouth Qid As Needed For Cough  Allergies (verified): 1)  ! Septra 2)  ! Augmentin  Past History:  Past Medical History: Reviewed history from 10/10/2009 and no changes required. morbid obesity migraine headache attention deficit disorder anxiety/depression gastroesophageal reflux disease allergic rhinitis Irritable bowel syndrome Hypertension Allergic rhinitis Hyperlipidemia  Past Surgical History: Reviewed history from 09/13/2007 and no changes required. right ankle surgery due to fracture Appendectomy  Family History: Reviewed history from 03/20/2009 and no changes required. Family History of Clotting disorder: Mother Family History of Heart Disease: Both Parents Families  Social History: Reviewed history from 07/19/2008 and no changes required. Occupation:  works [employer] Never Smoked Alcohol use-yes Drug use-no Regular exercise-no Hepatitis Risk:  no risk noted STD Risk:  no risk noted  Review of Systems       The patient complains of weight gain.  The patient denies anorexia, weight loss, chest pain, dyspnea on exertion, peripheral edema, hemoptysis, abdominal pain, suspicious skin lesions, enlarged lymph nodes, and angioedema.    Physical Exam  General:  mild distress with an intermittent cough.  alert, well-developed, well-nourished, well-hydrated, appropriate dress, cooperative to examination, good hygiene, overweight-appearing, and mild distress.   Head:  normocephalic, atraumatic, no abnormalities observed, and no abnormalities palpated.   Eyes:  vision grossly intact, pupils equal, and pupils round.   Ears:  R ear normal and L ear normal.   Nose:  nasal discharge, mucosal erythema, and mucosal edema.  no airflow obstruction, no intranasal foreign body, no nasal polyps, no nasal mucosal lesions, no mucosal friability, no active bleeding or clots, no septum abnormalities, nasal dischargemucosal pallor, mucosal  erythema, mucosal edema, L maxillary sinus tenderness, and R maxillary sinus tenderness.   Mouth:  good dentition, no exudates, no posterior lymphoid hypertrophy, no postnasal drip, no pharyngeal crowing, no lesions, no aphthous ulcers, no erosions, no tongue abnormalities, no leukoplakia, no petechiae, and pharyngeal erythema.   Neck:  No deformities, masses, or tenderness noted. Lungs:  Normal respiratory effort, chest expands symmetrically. Lungs are clear to auscultation, no crackles or wheezes. Heart:  Normal rate and regular rhythm. S1 and S2 normal without gallop, murmur, click, rub or other extra sounds. Abdomen:  Bowel sounds positive,abdomen soft and non-tender without masses, organomegaly or hernias noted. Msk:  No deformity or scoliosis noted of thoracic or lumbar spine.   Pulses:  R and L carotid,radial,femoral,dorsalis pedis and posterior tibial pulses are full and equal bilaterally Extremities:  No clubbing, cyanosis, edema, or deformity noted with normal full range of motion of all joints.   Neurologic:  No cranial nerve deficits noted. Station and gait are normal.  Plantar reflexes are down-going bilaterally. DTRs are symmetrical throughout. Sensory, motor and coordinative functions appear intact. Skin:  Intact without suspicious lesions or rashes Cervical Nodes:  no anterior cervical adenopathy and no posterior cervical adenopathy.   Axillary Nodes:  no R axillary adenopathy and no L axillary adenopathy.   Psych:  Oriented X3, memory intact for recent and remote, normally interactive, good eye contact, not anxious appearing, not depressed appearing, not agitated, and not suicidal.     Impression & Recommendations:  Problem # 1:  BRONCHITIS-ACUTE (ICD-466.0) Assessment New  The following medications were removed from the medication list:    Tussicaps 10-8 Mg Xr12h-cap (Hydrocod polst-chlorphen polst) ..... One by mouth two times a day as needed for cough    Avelox 400 Mg Tabs  (Moxifloxacin hcl) ..... One by mouth once daily for 14 days His updated medication list for this problem includes:    Zithromax Tri-pak 500 Mg Tab (Azithromycin) .Marland Kitchen... Take one by mouth once daily for 3 days    Mytussin Ac 100-10 Mg/27ml Syrp (Guaifenesin-codeine) .Marland Kitchen... 5-10 ml by mouth qid as needed for cough  Take antibiotics and other medications as directed. Encouraged to push clear liquids, get enough rest, and take acetaminophen as needed. To be seen in 5-7 days if no improvement, sooner if worse.  Problem # 2:  ASTHMA, UNSPECIFIED, UNSPECIFIED STATUS (ICD-493.90) Assessment: Unchanged he may need to restart inhalers as he has some cough and SOB even when he isn't dwon with an acute URI. will look at PFT's for more detail. Orders: Pulmonary Referral (Pulmonary)  Pulmonary Functions Reviewed: O2 sat: 96 (12/02/2009)  Problem # 3:  COUGH (ICD-786.2) Assessment: Deteriorated  Orders: T-2 View CXR ([id number]) Pulmonary Referral (Pulmonary)  Complete Medication List: 1)  Wellbutrin 75 Mg Tabs (Bupropion hcl) .... Take 1 tablet by mouth once a day follow-up appt is due no addtional refilsl until appt 2)  Concerta 36 Mg Tbcr (Methylphenidate hcl) .... Take 1 tablet by mouth once a day as needed 3)  Xanax 0.5 Mg Tabs (Alprazolam) .... Take 1 tablet by mouth once a day as needed 4)  Omeprazole 40 Mg Cpdr (Omeprazole) .Marland Kitchen.. 1 once daily/stop zegerid 5)  Hyoscyamine Sulfate 0.125 Mg Subl (Hyoscyamine sulfate) .... As needed for cramping 6)  Hyoscyamine Sulfate Cr 0.375 Mg Tb12 (Hyoscyamine sulfate) .... Take 1 tablet by mouth two times a day 7)  Cetirizine Hcl 10 Mg Tabs (Cetirizine hcl) .Marland Kitchen.. 1po once daily as needed 8)  Omnaris 50 Mcg/act Susp (Ciclesonide) .... 2 spray/side once daily 9)  Losartan Potassium 100 Mg Tabs (Losartan potassium) .... Once daily for high blood pressure 10)  Zithromax Tri-pak 500 Mg Tab (Azithromycin) .... Take one by mouth once daily for 3 days 11)  Mytussin Ac  100-10 Mg/62ml Syrp (Guaifenesin-codeine) .... 5-10 ml by mouth qid as needed for cough  Patient Instructions: 1)  Please schedule a follow-up appointment in 2 weeks. 2)  Take your antibiotic as prescribed until ALL of it is gone, but stop if you develop a rash or swelling and contact our office as soon as possible. 3)  Acute bronchitis symptoms for less than 10 days are not helped by antibiotics. take over the counter cough medications. call if no improvment in  5-7 days, sooner if increasing cough, fever, or new symptoms( shortness of breath, chest pain). Prescriptions: MYTUSSIN AC 100-10 MG/5ML SYRP (GUAIFENESIN-CODEINE) 5-10 ml by mouth QID as needed for cough  #6 ounces x 1   Entered and Authorized by:  Etta Grandchild MD   Signed by:   Etta Grandchild MD on 12/02/2009   Method used:   Print then Give to Patient   RxID:   [id number] ZITHROMAX TRI-PAK 500 MG TAB (AZITHROMYCIN) Take one by mouth once daily for 3 days  #3 x 0   Entered and Authorized by:   Etta Grandchild MD   Signed by:   Etta Grandchild MD on 12/02/2009   Method used:   Print then Give to Patient   RxID:   [id number]

## 2010-11-13 NOTE — Progress Notes (Signed)
Summary: refill  Phone Note Refill Request Message from:  Fax from Pharmacy on December 03, 2009 11:46 AM  Refills Requested: Medication #1:  WELLBUTRIN 75 MG TABS Take 1 tablet by mouth once a day FOLLOW-UP APPT IS DUE NO ADDTIONAL REFILSL UNTIL APPT   Supply Requested: 3 months  Medication #2:  LOSARTAN POTASSIUM 100 MG TABS once daily for high blood pressure   Supply Requested: 3 months Initial call taken by: Rock Nephew CMA,  December 03, 2009 11:46 AM    Prescriptions: LOSARTAN POTASSIUM 100 MG TABS (LOSARTAN POTASSIUM) once daily for high blood pressure  #90 x 3   Entered by:   Rock Nephew CMA   Authorized by:   Etta Grandchild MD   Signed by:   Rock Nephew CMA on 12/03/2009   Method used:   Electronically to        [facility]  [address] [phone]* (retail)       [address]       [place], [place]  [address]       Ph: [phone]       Fax: [phone]   RxID:   [id number] WELLBUTRIN 75 MG TABS (BUPROPION HCL) Take 1 tablet by mouth once a day FOLLOW-UP APPT IS DUE NO ADDTIONAL REFILSL UNTIL APPT  #90 x 3   Entered by:   Rock Nephew CMA   Authorized by:   Etta Grandchild MD   Signed by:   Rock Nephew CMA on 12/03/2009   Method used:   Electronically to        [facility]  [address] [phone]* (retail)       [address]       [place], [place]  [address]       Ph: [phone]       Fax: [phone]   RxID:   [id number]

## 2010-12-19 ENCOUNTER — Ambulatory Visit (INDEPENDENT_AMBULATORY_CARE_PROVIDER_SITE_OTHER): Payer: 59 | Admitting: Internal Medicine

## 2010-12-19 ENCOUNTER — Encounter: Payer: Self-pay | Admitting: Internal Medicine

## 2010-12-19 DIAGNOSIS — J019 Acute sinusitis, unspecified: Secondary | ICD-10-CM

## 2010-12-19 DIAGNOSIS — J309 Allergic rhinitis, unspecified: Secondary | ICD-10-CM

## 2010-12-19 DIAGNOSIS — I10 Essential (primary) hypertension: Secondary | ICD-10-CM

## 2010-12-23 NOTE — Assessment & Plan Note (Signed)
Summary: chest congestion/coughing mucus with blood/lb   Vital Signs:  Patient profile:   25 year old male Height:      75 inches Weight:      386 pounds BMI:     48.42 O2 Sat:      98 % on Room air Temp:     98.5 degrees F oral Pulse rate:   75 / minute Pulse rhythm:   regular Resp:     16 per minute BP sitting:   118 / 86  (left arm) Cuff size:   large  Vitals Entered By: Rock Nephew CMA (December 19, 2010 8:17 AM)  Nutrition Counseling: Patient's BMI is greater than 25 and therefore counseled on weight management options.  O2 Flow:  Room air CC: Patient c/o congestion, URI symptoms, Hypertension Management Is Patient Diabetic? No Pain Assessment Patient in pain? no       Does patient need assistance? Functional Status Self care Ambulation Normal   Primary Care Provider:  Sanda Linger, MD  CC:  Patient c/o congestion, URI symptoms, and Hypertension Management.  History of Present Illness:  URI Symptoms      This is a 25 year old man who presents with URI symptoms.  The symptoms began 2 weeks ago.  The severity is described as moderate.  The patient reports nasal congestion, purulent nasal discharge, and productive cough, but denies clear nasal discharge, sore throat, dry cough, earache, and sick contacts.  The patient denies fever, stiff neck, dyspnea, wheezing, rash, vomiting, diarrhea, use of an antipyretic, and response to antipyretic.  The patient also reports sneezing, seasonal symptoms, and response to antihistamine.  The patient denies headache, muscle aches, and severe fatigue.  Risk factors for Strep sinusitis include unilateral facial pain, unilateral nasal discharge, poor response to decongestant, and double sickening.  The patient denies the following risk factors for Strep sinusitis: tooth pain, Strep exposure, tender adenopathy, and absence of cough.    Hypertension History:      He denies headache, chest pain, palpitations, dyspnea with exertion,  orthopnea, PND, peripheral edema, visual symptoms, neurologic problems, syncope, and side effects from treatment.  He notes no problems with any antihypertensive medication side effects.        Positive major cardiovascular risk factors include hyperlipidemia and hypertension.  Negative major cardiovascular risk factors include male age less than 19 years old, no history of diabetes, negative family history for ischemic heart disease, and non-tobacco-user status.        Further assessment for target organ damage reveals no history of ASHD, cardiac end-organ damage (CHF/LVH), stroke/TIA, peripheral vascular disease, renal insufficiency, or hypertensive retinopathy.    Preventive Screening-Counseling & Management  Alcohol-Tobacco     Alcohol drinks/day: <1     Alcohol type: all     >5/day in last 3 mos: no     Alcohol Counseling: to STOP drinking     Feels need to cut down: no     Feels annoyed by complaints: no     Feels guilty re: drinking: no     Needs 'eye opener' in am: no     Smoking Status: never     Passive Smoke Exposure: yes     Tobacco Counseling: not indicated; no tobacco use  Hep-HIV-STD-Contraception     Hepatitis Risk: no risk noted     HIV Risk: no risk noted     STD Risk: no risk noted      Sexual History:  not active.  Drug Use:  never.        Blood Transfusions:  no.    Clinical Review Panels:  Prevention   Last Colonoscopy:  Results: Normal. Location:  [facility].  (12/04/2005)  Immunizations   Last Flu Vaccine:  Fluvax 3+ (09/24/2009)  Lipid Management   Cholesterol:  162 (10/10/2009)   LDL (bad choesterol):  108 (10/10/2009)   HDL (good cholesterol):  38.40 (10/10/2009)  Diabetes Management   HgBA1C:  5.5 (03/08/2007)   Creatinine:  0.9 (06/13/2010)   Last Flu Vaccine:  Fluvax 3+ (09/24/2009)  CBC   WBC:  11.1 (06/30/2010)   RBC:  5.00 (06/30/2010)   Hgb:  14.2 (06/30/2010)   Hct:  42.4 (06/30/2010)   Platelets:  223.0  (06/30/2010)   MCV  84.8 (06/30/2010)   MCHC  33.5 (06/30/2010)   RDW  13.8 (06/30/2010)   PMN:  64.4 (06/30/2010)   Lymphs:  26.3 (06/30/2010)   Monos:  6.3 (06/30/2010)   Eosinophils:  2.4 (06/30/2010)   Basophil:  0.6 (06/30/2010)  Complete Metabolic Panel   Glucose:  76 (06/13/2010)   Sodium:  137 (06/13/2010)   Potassium:  4.0 (06/13/2010)   Chloride:  102 (06/13/2010)   CO2:  26 (06/13/2010)   BUN:  10 (06/13/2010)   Creatinine:  0.9 (06/13/2010)   Albumin:  3.8 (06/13/2010)   Total Protein:  7.3 (06/13/2010)   Calcium:  9.1 (06/13/2010)   Total Bili:  1.1 (06/13/2010)   Alk Phos:  55 (06/13/2010)   SGPT (ALT):  32 (06/13/2010)   SGOT (AST):  21 (06/13/2010)   Medications Prior to Update: 1)  Wellbutrin 75 Mg Tabs (Bupropion Hcl) .... Take 1 Tablet By Mouth Once A Day Follow-Up Appt Is Due No Addtional Refilsl Until Appt 2)  Concerta 36 Mg  Tbcr (Methylphenidate Hcl) .... Take 1 Tablet By Mouth Once A Day As Needed 3)  Omeprazole 40 Mg Cpdr (Omeprazole) .... Take One 30-60 Min Before First and Last Meals of The Day 4)  Proair Hfa 108 (90 Base) Mcg/act Aers (Albuterol Sulfate) .Marland Kitchen.. 1-2 Puffs Qid As Needed For Sob and Wheezing 5)  Diovan 160 Mg  Tabs (Valsartan) .... One By Mouth Daily  Current Medications (verified): 1)  Wellbutrin 75 Mg Tabs (Bupropion Hcl) .... Take 1 Tablet By Mouth Once A Day 2)  Concerta 36 Mg  Tbcr (Methylphenidate Hcl) .... Take 1 Tablet By Mouth Once A Day As Needed 3)  Omeprazole 40 Mg Cpdr (Omeprazole) .... Take One 30-60 Min Before First and Last Meals of The Day 4)  Losartan Potassium 100 Mg Tabs (Losartan Potassium) .... One By Mouth Once Daily 5)  Avelox 400 Mg Tabs (Moxifloxacin Hcl) .... One By Mouth Once Daily For 10 Days  Allergies (verified): 1)  ! Septra 2)  ! Augmentin  Past History:  Past Medical History: Last updated: 12/12/2009 morbid obesity migraine headache attention deficit  disorder anxiety/depression gastroesophageal reflux disease allergic rhinitis Irritable bowel syndrome Hypertension Allergic rhinitis Hyperlipidemia Cough    - onset 09/2009 in setting of ? 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 Both Parents smoke  Risk Factors: Alcohol Use: <1 (12/19/2010) >5 drinks/d w/in last 3 months: no (12/19/2010) Exercise: no (07/19/2008)  Risk Factors: Smoking Status: never (12/19/2010) Passive Smoke Exposure: yes (12/19/2010)  Family History: Reviewed history from 12/12/2009 and no changes required. Family History of Clotting disorder: Mother Family History of Heart Disease: Both Parents Families Lung CA- MGM  wheezing in mother with colds   Social History: Reviewed history from 12/12/2009 and no changes required. 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 Both Parents smoke  Review of Systems  The patient denies anorexia, fever, weight loss, weight gain, hoarseness, chest pain, syncope, dyspnea on exertion, peripheral edema, headaches, hemoptysis, abdominal pain, hematuria, muscle weakness, suspicious skin lesions, transient blindness, difficulty walking, depression, unusual weight change, abnormal bleeding, and enlarged lymph nodes.   Resp:  Complains of cough; denies chest  discomfort, chest pain with inspiration, morning headaches, pleuritic, shortness of breath, sputum productive, and wheezing.  Physical Exam  General:  alert, well-developed, well-nourished, well-hydrated, appropriate dress, normal appearance, healthy-appearing, cooperative to examination, good hygiene, and overweight-appearing.   Head:  normocephalic, atraumatic, no abnormalities observed, and no abnormalities palpated.   Ears:  R ear normal and L ear normal.   Nose:  no external deformity, no airflow obstruction, no intranasal foreign body, no nasal polyps, no nasal mucosal lesions, no mucosal friability, no active bleeding or clots, no septum abnormalities, nasal dischargemucosal pallor, mucosal erythema, mucosal edema, L maxillary sinus tenderness, and R maxillary sinus tenderness.   Mouth:  Oral mucosa and oropharynx without lesions or exudates.  Teeth in good repair. Neck:  supple, full ROM, no masses, no thyromegaly, no thyroid nodules or tenderness, no JVD, normal carotid upstroke, no carotid bruits, and no cervical lymphadenopathy.   Lungs:  normal respiratory effort, no intercostal retractions, no accessory muscle use, normal breath sounds, no dullness, no fremitus, no crackles, and no wheezes.   Heart:  normal rate, regular rhythm, no murmur, no gallop, no rub, and no JVD.   Abdomen:  soft, non-tender, normal bowel sounds, no distention, no masses, no guarding, no rigidity, no rebound tenderness, no abdominal hernia, no inguinal hernia, no hepatomegaly, and no splenomegaly.   Msk:  normal ROM, no joint tenderness, no joint swelling, no joint warmth, no redness over joints, no joint deformities, no joint instability, and no crepitation.   Pulses:  R and L carotid,radial,femoral,dorsalis pedis and posterior tibial pulses are full and equal bilaterally Extremities:  No clubbing, cyanosis, edema, or deformity noted with normal full range of motion of all joints.   Neurologic:  No cranial nerve  deficits noted. Station and gait are normal. Plantar reflexes are down-going bilaterally. DTRs are symmetrical throughout. Sensory, motor and coordinative functions appear intact. Skin:  turgor normal, color normal, no rashes, no suspicious lesions, no ecchymoses, no petechiae, no purpura, no ulcerations, and no edema.   Cervical Nodes:  no anterior cervical adenopathy and no posterior cervical adenopathy.   Axillary Nodes:  no R axillary adenopathy and no L axillary adenopathy.   Psych:  Cognition and judgment appear intact. Alert and cooperative with normal attention span and concentration. No apparent delusions, illusions, hallucinations   Impression & Recommendations:  Problem # 1:  ALLERGIC RHINITIS (ICD-477.9) Assessment Deteriorated  Discussed use of allergy medications and environmental measures.   His  updated medication list for this problem includes:    Th Cetirizine Hcl 10 Mg Tabs (Cetirizine hcl) ..... One by mouth once daily  Problem # 2:  SINUSITIS- ACUTE-NOS (ICD-461.9) Assessment: Deteriorated  His updated medication list for this problem includes:    Avelox 400 Mg Tabs (Moxifloxacin hcl) ..... One by mouth once daily for 10 days  Orders: Admin of Therapeutic Inj  intramuscular or subcutaneous (16109) Depo- Medrol 40mg  (J1030) Depo- Medrol 80mg  (J1040)  Problem # 3:  HYPERTENSION (ICD-401.9) Assessment: Improved  The following medications were removed from the medication list:    Diovan 160 Mg Tabs (Valsartan) ..... One by mouth daily His updated medication list for this problem includes:    Losartan Potassium 100 Mg Tabs (Losartan potassium) ..... One by mouth once daily  BP today: 118/86 Prior BP: 124/88 (06/30/2010)  Prior 10 Yr Risk Heart Disease: 4 % (08/15/2008)  Labs Reviewed: K+: 4.0 (06/13/2010) Creat: : 0.9 (06/13/2010)   Chol: 162 (10/10/2009)   HDL: 38.40 (10/10/2009)   LDL: 108 (10/10/2009)   TG: 77.0 (10/10/2009)  Problem # 4:  ASTHMA,  UNSPECIFIED, UNSPECIFIED STATUS (ICD-493.90) Assessment: Improved  The following medications were removed from the medication list:    Proair Hfa 108 (90 Base) Mcg/act Aers (Albuterol sulfate) .Marland Kitchen... 1-2 puffs qid as needed for sob and wheezing  Pulmonary Functions Reviewed: O2 sat: 98 (12/19/2010)  Complete Medication List: 1)  Wellbutrin 75 Mg Tabs (Bupropion hcl) .... Take 1 tablet by mouth once a day 2)  Concerta 36 Mg Tbcr (Methylphenidate hcl) .... Take 1 tablet by mouth once a day as needed 3)  Omeprazole 40 Mg Cpdr (Omeprazole) .... Take one 30-60 min before first and last meals of the day 4)  Losartan Potassium 100 Mg Tabs (Losartan potassium) .... One by mouth once daily 5)  Avelox 400 Mg Tabs (Moxifloxacin hcl) .... One by mouth once daily for 10 days 6)  Th Cetirizine Hcl 10 Mg Tabs (Cetirizine hcl) .... One by mouth once daily  Hypertension Assessment/Plan:      The patient's hypertensive risk group is category B: At least one risk factor (excluding diabetes) with no target organ damage.  His calculated 10 year risk of coronary heart disease is 4 %.  Today's blood pressure is 118/86.  His blood pressure goal is < 140/90.   Patient Instructions: 1)  Please schedule a follow-up appointment in 2 weeks. 2)  It is important that you exercise regularly at least 20 minutes 5 times a week. If you develop chest pain, have severe difficulty breathing, or feel very tired , stop exercising immediately and seek medical attention. 3)  You need to lose weight. Consider a lower calorie diet and regular exercise.  4)  Check your Blood Pressure regularly. If it is above 130/80: you should make an appointment. 5)  Take your antibiotic as prescribed until ALL of it is gone, but stop if you develop a rash or swelling and contact our office as soon as possible. 6)  Acute bronchitis symptoms for less than 10 days are not helped by antibiotics. take over the counter cough medications. call if no  improvment in  5-7 days, sooner if increasing cough, fever, or new symptoms( shortness of breath, chest pain). Prescriptions: TH CETIRIZINE HCL 10 MG TABS (CETIRIZINE HCL) one by mouth once daily  #90 x 3   Entered and Authorized by:   Etta Grandchild MD   Signed by:   Etta Grandchild MD on 12/19/2010  Method used:   Electronically to        [facility]  [address] [id number] ([facility]ail)       [address]       [place], [place]  [address]       Ph: [phone]       Fax: [phone]   RxID:   [id number] WELLBUTRIN 75 MG TABS (BUPROPION HCL) Take 1 tablet by mouth once a day  #90 x 3   Entered and Authorized by:   Etta Grandchild MD   Signed by:   Etta Grandchild MD on 12/19/2010   Method used:   Print then Give to Patient   RxID:   [id number] AVELOX 400 MG TABS (MOXIFLOXACIN HCL) One by mouth once daily for 10 days  #10 x 1   Entered and Authorized by:   Etta Grandchild MD   Signed by:   Etta Grandchild MD on 12/19/2010   Method used:   Print then Give to Patient   RxID:   [id number] OMEPRAZOLE 40 MG CPDR (OMEPRAZOLE) Take one 30-60 min before first and last meals of the day  #90.0 Capsule x 3   Entered and Authorized by:   Etta Grandchild MD   Signed by:   Etta Grandchild MD on 12/19/2010   Method used:   Print then Give to Patient   RxID:   [id number] CWCBJSEG POTASSIUM 100 MG TABS (LOSARTAN POTASSIUM) One by mouth once daily  #90 x 3   Entered and Authorized by:   Etta Grandchild MD   Signed by:   Etta Grandchild MD on 12/19/2010   Method used:   Print then Give to Patient   RxID:   [id number]    Medication Administration  Injection # 1:    Medication: Depo- Medrol 80mg     Diagnosis: SINUSITIS- ACUTE-NOS (ICD-461.9)    Route: IM    Site: L deltoid    Exp Date: 04/2013    Lot #: [id number]    Mfr: pfizer    Comments: pt given 120mg     Patient tolerated injection without complications    Given by: Rock Nephew CMA (December 19, 2010 8:33 AM)  Injection # 2:    Medication: Depo- Medrol 40mg     Diagnosis: SINUSITIS- ACUTE-NOS (ICD-461.9)    Site: L deltoid    Exp Date: 04/2013    Lot #: [id number]    Mfr: pfizer    Patient tolerated injection without complications    Given by: Rock Nephew CMA (December 19, 2010 8:33 AM)  Orders Added: 1)  Admin of Therapeutic Inj  intramuscular or subcutaneous [96372] 2)  Depo- Medrol 40mg  [J1030] 3)  Depo- Medrol 80mg  [J1040] 4)  Est. Patient Level V [69485]     Medication Administration  Injection # 1:    Medication: Depo- Medrol 80mg     Diagnosis: SINUSITIS- ACUTE-NOS (ICD-461.9)    Route: IM    Site: L deltoid    Exp Date: 04/2013    Lot #: [id number]    Mfr: pfizer    Comments: pt given 120mg     Patient tolerated injection without complications    Given by: Rock Nephew CMA (December 19, 2010 8:33 AM)  Injection # 2:    Medication: Depo- Medrol 40mg     Diagnosis: SINUSITIS- ACUTE-NOS (ICD-461.9)    Site: L deltoid    Exp Date: 04/2013    Lot #:  [id number]    Mfr: pfizer    Patient tolerated injection without complications    Given by: Rock Nephew CMA (December 19, 2010 8:33 AM)  Orders Added: 1)  Admin of Therapeutic Inj  intramuscular or subcutaneous [96372] 2)  Depo- Medrol 40mg  [J1030] 3)  Depo- Medrol 80mg  [J1040] 4)  Est. Patient Level V [11914]

## 2010-12-31 LAB — STREP A DNA PROBE

## 2010-12-31 LAB — URINALYSIS, ROUTINE W REFLEX MICROSCOPIC
Ketones, ur: NEGATIVE mg/dL
Nitrite: NEGATIVE
Specific Gravity, Urine: 1.023 (ref 1.005–1.030)
pH: 5.5 (ref 5.0–8.0)

## 2010-12-31 LAB — CBC
Platelets: 222 10*3/uL (ref 150–400)
WBC: 14.5 10*3/uL — ABNORMAL HIGH (ref 4.0–10.5)

## 2010-12-31 LAB — BASIC METABOLIC PANEL
BUN: 12 mg/dL (ref 6–23)
CO2: 24 mEq/L (ref 19–32)
Chloride: 104 mEq/L (ref 96–112)
Creatinine, Ser: 0.99 mg/dL (ref 0.4–1.5)
Glucose, Bld: 81 mg/dL (ref 70–99)

## 2010-12-31 LAB — DIFFERENTIAL
Eosinophils Absolute: 0.2 10*3/uL (ref 0.0–0.7)
Lymphocytes Relative: 13 % (ref 12–46)
Lymphs Abs: 1.9 10*3/uL (ref 0.7–4.0)
Neutrophils Relative %: 75 % (ref 43–77)

## 2010-12-31 LAB — MONONUCLEOSIS SCREEN: Mono Screen: NEGATIVE

## 2011-01-19 LAB — POCT URINALYSIS DIP (DEVICE)
Bilirubin Urine: NEGATIVE
Hgb urine dipstick: NEGATIVE
Nitrite: NEGATIVE
Specific Gravity, Urine: <=1.005 (ref 1.005–1.030)
pH: 5.5 (ref 5.0–8.0)

## 2011-01-21 ENCOUNTER — Other Ambulatory Visit: Payer: Self-pay | Admitting: Internal Medicine

## 2011-01-23 ENCOUNTER — Telehealth: Payer: Self-pay | Admitting: *Deleted

## 2011-01-23 DIAGNOSIS — J309 Allergic rhinitis, unspecified: Secondary | ICD-10-CM

## 2011-01-23 MED ORDER — METHYLPREDNISOLONE 4 MG PO KIT: PACK | ORAL | Status: AC

## 2011-01-23 NOTE — Telephone Encounter (Signed)
Steroid pak sent to pharmacy

## 2011-01-23 NOTE — Telephone Encounter (Signed)
Pt aware, also provided avelox discount card

## 2011-01-23 NOTE — Telephone Encounter (Signed)
Pt's mother called stating pt completed the abx with some improvement and has been taking Mucinex DM. However, the cough has returned and he is c/o HA.  She state the avelox card would cover the refill and she can not afford to pay $50+ for it.  She is req oral steroids be called in or something that Dr. Yetta Barre feels is appropriate. No schedule openings available... Please advise

## 2011-01-27 LAB — POCT I-STAT, CHEM 8
Calcium, Ion: 1.21 mmol/L (ref 1.12–1.32)
Creatinine, Ser: 0.9 mg/dL (ref 0.4–1.5)
Glucose, Bld: 131 mg/dL — ABNORMAL HIGH (ref 70–99)
HCT: 48 % (ref 39.0–52.0)
Hemoglobin: 16.3 g/dL (ref 13.0–17.0)

## 2011-01-27 LAB — DIFFERENTIAL
Basophils Absolute: 0 10*3/uL (ref 0.0–0.1)
Basophils Relative: 0 % (ref 0–1)
Eosinophils Absolute: 0 10*3/uL (ref 0.0–0.7)
Eosinophils Relative: 0 % (ref 0–5)
Monocytes Absolute: 0.5 10*3/uL (ref 0.1–1.0)

## 2011-01-27 LAB — URINALYSIS, ROUTINE W REFLEX MICROSCOPIC
Bilirubin Urine: NEGATIVE
Hgb urine dipstick: NEGATIVE
Ketones, ur: NEGATIVE mg/dL
Nitrite: NEGATIVE
Urobilinogen, UA: 0.2 mg/dL (ref 0.0–1.0)

## 2011-01-27 LAB — CBC
HCT: 45.7 % (ref 39.0–52.0)
Hemoglobin: 15.7 g/dL (ref 13.0–17.0)
MCHC: 34.3 g/dL (ref 30.0–36.0)
MCV: 84.5 fL (ref 78.0–100.0)
RDW: 13.4 % (ref 11.5–15.5)

## 2011-02-24 NOTE — Assessment & Plan Note (Signed)
[facility]                                 ON-CALL NOTE   NAME:HOLTKlever, Twyford                        MRN:          [id number]  DATE:06/13/2007                            DOB:          1986/09/07    PHONE NUMBER:  [phone]  Mother is calling because she thinks her son has acute cholecystitis.  He has severe pain.  Advised them to take him to the emergency room for  evaluation.     Jeffrey A. Tawanna Cooler, MD  Electronically Signed    JAT/MedQ  DD: 06/13/2007  DT: 06/13/2007  Job #: [id number]

## 2011-02-27 NOTE — Op Note (Signed)
NAME:  Alexis [name], Alexis [name]                           ACCOUNT NO.:  [id number]   MEDICAL RECORD NO.:  [id number]                   PATIENT TYPE:  INP   LOCATION:  1823                                 FACILITY:  [facility]   PHYSICIAN:  Gabrielle Dare. Janee Morn, M.D.             DATE OF BIRTH:  1986/01/24   DATE OF PROCEDURE:  05/17/2003  DATE OF DISCHARGE:                                 OPERATIVE REPORT   PREOPERATIVE DIAGNOSIS:  Acute appendicitis.   POSTOPERATIVE DIAGNOSIS:  Acute appendicitis.   PROCEDURE:  Laparoscopic appendectomy.   SURGEON:  Gabrielle Dare. Janee Morn, M.D.   ANESTHESIA:  General.   INDICATIONS FOR PROCEDURE:  The patient is a 25 year old male who presented  to the emergency department earlier this morning complaining of right lower  quadrant pain which started last night around 8 p.m. There was some  associated nausea but no vomiting. A workup in the emergency department  included  a CT scan which showed acute appendicitis and he is brought to the  operating room for an emergent laparoscopic appendectomy.   DESCRIPTION OF PROCEDURE:  Informed consent was obtained from the patient's  parents. He received intravenous antibiotics and he was brought to the  operating room and general anesthesia was administered. His abdomen  was  prepped and draped in a sterile fashion.   A curvilinear umbilical incision was made. The subcutaneous tissues were  dissected down. The anterior fascia was incised. The peritoneal cavity was  then entered under direct vision with some difficulty  due to the patient's  size. Once this was accomplished a 0 Vicryl pursestring suture was placed  around the fascial opening. The Hasson trocar was then inserted into the  abdomen. The abdomen was insufflated with CO2 in a standard fashion.   Exploration revealed the appendix was indeed quite inflamed although not  perforated. Subsequently under direct vision a 12-mm left lower quadrant  port and a 5-mm right  upper quadrant port were placed. At this time the  appendix was retracted toward the abdominal wall and the base was  circumferentially dissected. A window was made in the mesoappendix.   The base of the appendix was then divided with an endoscopic GIA stapler  with a tissue load. Once this was accomplished, 3 loads of the endoscopic  GIA stapler with a vascular load were fired across the mesoappendix. With  good hemostasis. The appendix was placed in an EndoCatch bag and removed via  the left lower quadrant port site.   Subsequently the abdomen was copiously irrigated. One small  area of  bleeding near the base of the appendix was cauterized. Excellent hemostasis  was achieved and the wound was irrigated with 2 liters of saline and the  irrigant returned clear.   The ports were removed under direct vision. Pneumoperitoneum was released.  The Hasson trocar was removed and the umbilical fascia site was closed by  tying  a pursestring suture. Subsequently all 3 wounds were copious irrigated  and 0.25% Marcaine with epinephrine had been used for local anesthetic. They  were subsequently closed with running 4-0 Vicryl subcuticular stitches.   All sponge, instrument and needle counts were correct. Benzoin, Steri-Strips  and sterile dressings were applied. The patient tolerated the procedure  without apparent complications and was returned to the recovery room in  stable condition.                                               Gabrielle Dare Janee Morn, M.D.    BET/MEDQ  D:  05/17/2003  T:  05/18/2003  Job:  [id number]

## 2011-02-27 NOTE — H&P (Signed)
NAME:  Alexis [name], Alexis [name]                           ACCOUNT NO.:  [id number]   MEDICAL RECORD NO.:  [id number]                   PATIENT TYPE:  INP   LOCATION:  6125                                 FACILITY:  [facility]   PHYSICIAN:  Gabrielle Dare. Janee Morn, M.D.             DATE OF BIRTH:  1985-12-11   DATE OF ADMISSION:  05/17/2003  DATE OF DISCHARGE:                                HISTORY & PHYSICAL   CHIEF COMPLAINT:  Right lower quadrant abdominal pain.   HISTORY OF PRESENT ILLNESS:  The patient is a 25 year old white male with a  history of ADD who developed acute onset of right lower quadrant abdominal  pain last night about 8 p.m.  He had associated nausea, but has not had any  vomiting.  The pain continued.  He came to the emergency department for  evaluation.  Here in the emergency department, his white blood cell count  was found to be 12.6.  He underwent CT scan of the abdomen and pelvis which  showed acute appendicitis.   PAST MEDICAL HISTORY:  ADD.   PAST SURGICAL HISTORY:  None.   MEDICATIONS:  1. Wellbutrin 75 mg p.o. daily.  2. Lamictal 25 mg p.o. b.i.d.  3. Aciphex for acid reflux.   ALLERGIES:  AUGMENTIN, SEPTRA.   SOCIAL HISTORY:  He does not drink alcohol or smoke cigarettes.   REVIEW OF SYSTEMS:  GENERAL:  No complaints.  CARDIOVASCULAR:  No  complaints.  PULMONARY:  No complaints.  GASTROINTESTINAL:  Please see the  history of present illness.  NEUROPSYCHIATRIC:  ADD.  The remainder of the  review of systems is negative.   PHYSICAL EXAMINATION:  VITAL SIGNS:  Blood pressure 127/72, heart rate 80,  temperature 98.8, respirations 20.  GENERAL:  He is awake, alert, in no acute distress.  HEENT:  Pupils are equal and reactive to light.  Sclerae are nonicteric.  NECK:  Supple with no masses.  LUNGS:  Clear to auscultation bilaterally.  HEART:  Regular rate and rhythm.  PMI is palpable in the left chest.  ABDOMEN:  Soft.  No masses are palpable.  He has no inguinal  hernias.  He  does have tenderness in his right lower quadrant with voluntary guarding.  There is no Rovsing's sign.  EXTREMITIES:  Without edema.  SKIN:  Warm and dry with no rashes.   LABORATORY DATA:  CT scan of the abdomen and pelvis is consistent with acute  appendicitis.  White blood cell count 12.6.  Electrolyte panel is normal.  Urinalysis was negative.   IMPRESSION:  Acute appendicitis.   PLAN:  Take the patient to the operating room for laparoscopic appendectomy.  The procedure, risks, and benefits were discussed with the patient and his  parents.  They are agreeable with surgery and we will get this done straight  away.  Gabrielle Dare Janee Morn, M.D.    BET/MEDQ  D:  05/17/2003  T:  05/17/2003  Job:  [id number]

## 2011-07-24 LAB — URINALYSIS, ROUTINE W REFLEX MICROSCOPIC
Bilirubin Urine: NEGATIVE
Hgb urine dipstick: NEGATIVE
Ketones, ur: NEGATIVE
Nitrite: NEGATIVE
Protein, ur: NEGATIVE
Urobilinogen, UA: 0.2

## 2011-07-24 LAB — URINE CULTURE
Colony Count: NO GROWTH
Culture: NO GROWTH

## 2011-07-24 LAB — LIPASE, BLOOD: Lipase: 30

## 2011-07-24 LAB — CBC
HCT: 43.9
MCHC: 33.4
Platelets: 263
RDW: 14.7 — ABNORMAL HIGH

## 2011-07-24 LAB — DIFFERENTIAL
Basophils Relative: 0
Lymphs Abs: 3.3
Monocytes Absolute: 1 — ABNORMAL HIGH
Monocytes Relative: 10
Neutro Abs: 5.1
Neutrophils Relative %: 53

## 2011-07-24 LAB — COMPREHENSIVE METABOLIC PANEL
Albumin: 3.5
Alkaline Phosphatase: 76
BUN: 10
Calcium: 9.1
Glucose, Bld: 84
Potassium: 3.6
Sodium: 138
Total Protein: 7.4

## 2011-11-17 ENCOUNTER — Ambulatory Visit (INDEPENDENT_AMBULATORY_CARE_PROVIDER_SITE_OTHER): Payer: 59 | Admitting: Internal Medicine

## 2011-11-17 ENCOUNTER — Encounter: Payer: Self-pay | Admitting: Internal Medicine

## 2011-11-17 VITALS — BP 120/80 | HR 98 | Temp 98.6°F | Ht 77.0 in | Wt 390.4 lb

## 2011-11-17 DIAGNOSIS — J45909 Unspecified asthma, uncomplicated: Secondary | ICD-10-CM

## 2011-11-17 DIAGNOSIS — J309 Allergic rhinitis, unspecified: Secondary | ICD-10-CM

## 2011-11-17 DIAGNOSIS — J019 Acute sinusitis, unspecified: Secondary | ICD-10-CM

## 2011-11-17 MED ORDER — LEVOFLOXACIN 250 MG PO TABS: 250.0000 mg | ORAL_TABLET | Freq: Every day | ORAL | Status: AC

## 2011-11-17 MED ORDER — METHYLPREDNISOLONE ACETATE PF 80 MG/ML IJ SUSP
120.0000 mg | Freq: Once | INTRAMUSCULAR | Status: AC
  Administered 2011-11-17: 120 mg via INTRAMUSCULAR

## 2011-11-17 MED ORDER — HYDROCODONE-HOMATROPINE 5-1.5 MG/5ML PO SYRP: 5.0000 mL | ORAL_SOLUTION | Freq: Four times a day (QID) | ORAL | Status: AC | PRN

## 2011-11-17 NOTE — Assessment & Plan Note (Signed)
stable overall by hx and exam, most recent data reviewed with pt, and pt to continue medical treatment as before  SpO2 Readings from Last 3 Encounters:  11/17/11 98%  12/19/10 98%  06/30/10 96%

## 2011-11-17 NOTE — Patient Instructions (Signed)
You had the steroid shot today Take all new medications as prescribed You can also take Delsym OTC for cough, and/or Mucinex (or it's generic off brand) for congestion

## 2011-11-17 NOTE — Assessment & Plan Note (Signed)
Mild to mod, for depomedrol IM, otc allegra prn, and  to f/u any worsening symptoms or concerns 

## 2011-11-17 NOTE — Progress Notes (Signed)
  Subjective:    Patient ID: Alexis [name], male    DOB: 07/21/1986, 26 y.o.   MRN: [id number]  HPI  Here with acute onset mild to mod 2-3 days ST, HA, general weakness and malaise, with prod cough greenish sputum as well as facial pain, feverish, but Pt denies chest pain, increased sob or doe, wheezing, orthopnea, PND, increased LE swelling, palpitations, dizziness or syncope.  Does have several wks ongoing nasal allergy symptoms with clear congestion, itch and sneeze, without fever, pain, ST, cough or wheezing.  Pt denies new neurological symptoms such as new headache, or facial or extremity weakness or numbness   Pt denies polydipsia, polyuria.   Pt denies fever, wt loss, night sweats, loss of appetite, or other constitutional symptoms except for the above  Overall good compliance with treatment, and good medicine tolerability Past Medical History  Diagnosis Date  . ALLERGIC RHINITIS 06/10/2009  . ADD 09/13/2007  . ASTHMA, UNSPECIFIED, UNSPECIFIED STATUS 07/19/2008  . DEPRESSIVE DISORDER 09/13/2007  . DYSMETABOLIC SYNDROME 10/25/2009  . GERD 09/13/2007  . HYPERLIPIDEMIA 10/10/2009  . HYPERTENSION 09/13/2007  . IBS 03/20/2009  . Morbid obesity 07/19/2008   History reviewed. No pertinent past surgical history.  reports that he has never smoked. He has never used smokeless tobacco. He reports that he does not drink alcohol or use illicit drugs. family history includes Hypertension in his father. Allergies  Allergen Reactions  . YQM:VHQIONGEXBM+WUXLKGMWN+UUVOZDGUYQ Acid+Aspartame   . Sulfamethoxazole W/Trimethoprim    Current Outpatient Prescriptions on File Prior to Visit  Medication Sig Dispense Refill  . losartan (COZAAR) 100 MG tablet TAKE 1 TABLET BY MOUTH EVERY DAY FOR HIGH BLOOD PRESSURE  90 tablet  3   No current facility-administered medications on file prior to visit.    Review of Systems Review of Systems  Constitutional: Negative for diaphoresis and unexpected weight change.  HENT:  Negative for drooling and tinnitus.   Eyes: Negative for photophobia and visual disturbance.  Respiratory: Negative for choking and stridor.   Gastrointestinal: Negative for vomiting and blood in stool.  Genitourinary: Negative for hematuria and decreased urine volume.     Objective:   Physical Exam BP 120/80  Pulse 98  Temp(Src) 98.6 F (37 C) (Oral)  Ht 6\' 5"  (1.956 m)  Wt 390 lb 6 oz (177.073 kg)  BMI 46.29 kg/m2  SpO2 98% Physical Exam  VS noted, mil dill Constitutional: Pt appears well-developed and well-nourished.  HENT: Head: Normocephalic.  Right Ear: External ear normal.  Left Ear: External ear normal.  Bilat tm's mild erythema.  Sinus tender bilat.  Pharynx mild erythema Eyes: Conjunctivae and EOM are normal. Pupils are equal, round, and reactive to light.  Neck: Normal range of motion. Neck supple.  Cardiovascular: Normal rate and regular rhythm.   Pulmonary/Chest: Effort normal and breath sounds normal.  Neurological: Pt is alert. No cranial nerve deficit.  Skin: Skin is warm. No erythema.  Psychiatric: Pt behavior is normal. Thought content normal. 1+ nervous    Assessment & Plan:

## 2011-11-17 NOTE — Assessment & Plan Note (Signed)
Mild to mod, for antibx course,  to f/u any worsening symptoms or concerns 

## 2011-12-18 ENCOUNTER — Other Ambulatory Visit: Payer: Self-pay | Admitting: Internal Medicine

## 2011-12-19 ENCOUNTER — Other Ambulatory Visit: Payer: Self-pay | Admitting: Internal Medicine

## 2012-01-19 ENCOUNTER — Other Ambulatory Visit: Payer: Self-pay

## 2012-01-19 MED ORDER — LOSARTAN POTASSIUM 100 MG PO TABS: ORAL_TABLET | ORAL | Status: DC

## 2012-04-25 ENCOUNTER — Encounter: Payer: Self-pay | Admitting: Internal Medicine

## 2012-04-25 ENCOUNTER — Ambulatory Visit (INDEPENDENT_AMBULATORY_CARE_PROVIDER_SITE_OTHER): Payer: 59 | Admitting: Internal Medicine

## 2012-04-25 ENCOUNTER — Other Ambulatory Visit (INDEPENDENT_AMBULATORY_CARE_PROVIDER_SITE_OTHER): Payer: 59

## 2012-04-25 VITALS — BP 124/86 | HR 67 | Temp 97.7°F | Resp 16 | Ht 78.0 in | Wt 377.0 lb

## 2012-04-25 DIAGNOSIS — Z0000 Encounter for general adult medical examination without abnormal findings: Secondary | ICD-10-CM

## 2012-04-25 DIAGNOSIS — Z23 Encounter for immunization: Secondary | ICD-10-CM

## 2012-04-25 LAB — COMPREHENSIVE METABOLIC PANEL
ALT: 48 U/L (ref 0–53)
AST: 34 U/L (ref 0–37)
Albumin: 3.9 g/dL (ref 3.5–5.2)
CO2: 28 mEq/L (ref 19–32)
Calcium: 9.3 mg/dL (ref 8.4–10.5)
Chloride: 102 mEq/L (ref 96–112)
GFR: 124.16 mL/min (ref >60.00)
Potassium: 4.1 mEq/L (ref 3.5–5.1)
Sodium: 139 mEq/L (ref 135–145)
Total Protein: 7.5 g/dL (ref 6.0–8.3)

## 2012-04-25 LAB — CBC WITH DIFFERENTIAL/PLATELET
Basophils Absolute: 0 10*3/uL (ref 0.0–0.1)
Eosinophils Absolute: 0.2 10*3/uL (ref 0.0–0.7)
Hemoglobin: 14.5 g/dL (ref 13.0–17.0)
Lymphocytes Relative: 27.4 % (ref 12.0–46.0)
MCHC: 33.1 g/dL (ref 30.0–36.0)
Monocytes Relative: 8 % (ref 3.0–12.0)
Neutro Abs: 5.8 10*3/uL (ref 1.4–7.7)
Platelets: 222 10*3/uL (ref 150.0–400.0)
RDW: 15.1 % — ABNORMAL HIGH (ref 11.5–14.6)

## 2012-04-25 LAB — LIPID PANEL
LDL Cholesterol: 126 mg/dL — ABNORMAL HIGH (ref 0–99)
Total CHOL/HDL Ratio: 4
Triglycerides: 97 mg/dL (ref 0.0–149.0)

## 2012-04-25 NOTE — Progress Notes (Signed)
  Subjective:    Patient ID: Alexis [name], male    DOB: 1986/08/21, 26 y.o.   MRN: [id number]  HPI  He comes in today for a complete physical, he feels well and offers no complaints.   Review of Systems  Constitutional: Negative.   HENT: Negative.   Eyes: Negative.   Respiratory: Negative.   Cardiovascular: Negative.   Gastrointestinal: Negative.   Genitourinary: Negative.   Musculoskeletal: Negative.   Skin: Negative.   Neurological: Negative.   Hematological: Negative.   Psychiatric/Behavioral: Negative.        Objective:   Physical Exam  Vitals reviewed. Constitutional: He is oriented to person, place, and time. He appears well-developed and well-nourished. No distress.  HENT:  Head: Normocephalic and atraumatic.  Mouth/Throat: Oropharynx is clear and moist. No oropharyngeal exudate.  Eyes: Conjunctivae are normal. Right eye exhibits no discharge. Left eye exhibits no discharge. No scleral icterus.  Neck: Normal range of motion. Neck supple. No JVD present. No tracheal deviation present. No thyromegaly present.  Cardiovascular: Normal rate, regular rhythm, normal heart sounds and intact distal pulses.  Exam reveals no gallop and no friction rub.   No murmur heard. Pulmonary/Chest: Effort normal and breath sounds normal. No stridor. No respiratory distress. He has no wheezes. He has no rales. He exhibits no tenderness.  Abdominal: Soft. Bowel sounds are normal. He exhibits no distension and no mass. There is no tenderness. There is no rebound and no guarding. Hernia confirmed negative in the right inguinal area and confirmed negative in the left inguinal area.  Genitourinary: Testes normal and penis normal. Right testis shows no mass, no swelling and no tenderness. Right testis is descended. Left testis shows no mass, no swelling and no tenderness. Left testis is descended. Uncircumcised. No phimosis, paraphimosis, hypospadias, penile erythema or penile tenderness. No discharge  found.  Musculoskeletal: Normal range of motion. He exhibits no edema and no tenderness.  Lymphadenopathy:    He has no cervical adenopathy.       Right: No inguinal adenopathy present.       Left: No inguinal adenopathy present.  Neurological: He is oriented to person, place, and time.  Skin: Skin is warm and dry. No rash noted. He is not diaphoretic. No erythema. No pallor.  Psychiatric: He has a normal mood and affect. His behavior is normal. Judgment and thought content normal.      Lab Results  Component Value Date   WBC 11.1* 06/30/2010   HGB 14.2 06/30/2010   HCT 42.4 06/30/2010   PLT 223.0 06/30/2010   GLUCOSE 76 06/13/2010   CHOL 162 10/10/2009   TRIG 77.0 10/10/2009   HDL 38.40* 10/10/2009   LDLCALC 108* 10/10/2009   ALT 32 06/13/2010   AST 21 06/13/2010   NA 137 06/13/2010   K 4.0 06/13/2010   CL 102 06/13/2010   CREATININE 0.9 06/13/2010   BUN 10 06/13/2010   CO2 26 06/13/2010   TSH 1.58 06/13/2010   HGBA1C 5.5 03/08/2007      Assessment & Plan:

## 2012-04-25 NOTE — Patient Instructions (Addendum)
Health Maintenance, Males A healthy lifestyle and preventative care can promote health and wellness.  Maintain regular health, dental, and eye exams.   Eat a healthy diet. Foods like vegetables, fruits, whole grains, low-fat dairy products, and lean protein foods contain the nutrients you need without too many calories. Decrease your intake of foods high in solid fats, added sugars, and salt. Get information about a proper diet from your caregiver, if necessary.   Regular physical exercise is one of the most important things you can do for your health. Most adults should get at least 150 minutes of moderate-intensity exercise (any activity that increases your heart rate and causes you to sweat) each week. In addition, most adults need muscle-strengthening exercises on 2 or more days a week.    Maintain a healthy weight. The body mass index (BMI) is a screening tool to identify possible weight problems. It provides an estimate of body fat based on height and weight. Your caregiver can help determine your BMI, and can help you achieve or maintain a healthy weight. For adults 20 years and older:   A BMI below 18.5 is considered underweight.   A BMI of 18.5 to 24.9 is normal.   A BMI of 25 to 29.9 is considered overweight.   A BMI of 30 and above is considered obese.   Maintain normal blood lipids and cholesterol by exercising and minimizing your intake of saturated fat. Eat a balanced diet with plenty of fruits and vegetables. Blood tests for lipids and cholesterol should begin at age 20 and be repeated every 5 years. If your lipid or cholesterol levels are high, you are over 50, or you are a high risk for heart disease, you may need your cholesterol levels checked more frequently.Ongoing high lipid and cholesterol levels should be treated with medicines, if diet and exercise are not effective.   If you smoke, find out from your caregiver how to quit. If you do not use tobacco, do not start.    If you choose to drink alcohol, do not exceed 2 drinks per day. One drink is considered to be 12 ounces (355 mL) of beer, 5 ounces (148 mL) of wine, or 1.5 ounces (44 mL) of liquor.   Avoid use of street drugs. Do not share needles with anyone. Ask for help if you need support or instructions about stopping the use of drugs.   High blood pressure causes heart disease and increases the risk of stroke. Blood pressure should be checked at least every 1 to 2 years. Ongoing high blood pressure should be treated with medicines if weight loss and exercise are not effective.   If you are 45 to 26 years old, ask your caregiver if you should take aspirin to prevent heart disease.   Diabetes screening involves taking a blood sample to check your fasting blood sugar level. This should be done once every 3 years, after age 45, if you are within normal weight and without risk factors for diabetes. Testing should be considered at a younger age or be carried out more frequently if you are overweight and have at least 1 risk factor for diabetes.   Colorectal cancer can be detected and often prevented. Most routine colorectal cancer screening begins at the age of 50 and continues through age 75. However, your caregiver may recommend screening at an earlier age if you have risk factors for colon cancer. On a yearly basis, your caregiver may provide home test kits to check for hidden   blood in the stool. Use of a small camera at the end of a tube, to directly examine the colon (sigmoidoscopy or colonoscopy), can detect the earliest forms of colorectal cancer. Talk to your caregiver about this at age 50, when routine screening begins. Direct examination of the colon should be repeated every 5 to 10 years through age 75, unless early forms of pre-cancerous polyps or small growths are found.   Hepatitis C blood testing is recommended for all people born from 1945 through 1965 and any individual with known risks for  hepatitis C.   Healthy men should no longer receive prostate-specific antigen (PSA) blood tests as part of routine cancer screening. Consult with your caregiver about prostate cancer screening.   Testicular cancer screening is not recommended for adolescents or adult males who have no symptoms. Screening includes self-exam, caregiver exam, and other screening tests. Consult with your caregiver about any symptoms you have or any concerns you have about testicular cancer.   Practice safe sex. Use condoms and avoid high-risk sexual practices to reduce the spread of sexually transmitted infections (STIs).   Use sunscreen with a sun protection factor (SPF) of 30 or greater. Apply sunscreen liberally and repeatedly throughout the day. You should seek shade when your shadow is shorter than you. Protect yourself by wearing long sleeves, pants, a wide-brimmed hat, and sunglasses year round, whenever you are outdoors.   Notify your caregiver of new moles or changes in moles, especially if there is a change in shape or color. Also notify your caregiver if a mole is larger than the size of a pencil eraser.   A one-time screening for abdominal aortic aneurysm (AAA) and surgical repair of large AAAs by sound wave imaging (ultrasonography) is recommended for ages 65 to 75 years who are current or former smokers.   Stay current with your immunizations.  Document Released: 03/26/2008 Document Revised: 09/17/2011 Document Reviewed: 02/23/2011 [employer] Patient Information 2012 [employer]. 

## 2012-04-25 NOTE — Addendum Note (Signed)
Addended by: Rock Nephew T on: 04/25/2012 03:20 PM   Modules accepted: Orders

## 2012-04-25 NOTE — Assessment & Plan Note (Signed)
Exam done, vaccines were updated and reviewed, labs ordered, pt ed material was given

## 2012-04-27 ENCOUNTER — Telehealth: Payer: Self-pay

## 2012-04-27 NOTE — Telephone Encounter (Signed)
RN called stating pt called stating that pt is c/o of redness, tenderness, swelling, warm to the touch and difficulty raising arm above his head without pain since receiving Pneumovax. RN received a "see within 4 hours" disposition due to signs and symptoms of infection but there are no appts available.  RN was advised to leave message on triage for MD advisement.

## 2012-04-27 NOTE — Telephone Encounter (Signed)
This sounds like a typical reaction to the vaccine, ask him to come in tomorrow for me to check it

## 2012-04-28 NOTE — Telephone Encounter (Signed)
PT DECLINED THE APPT.

## 2012-04-28 NOTE — Telephone Encounter (Signed)
Left message on machine for pt to return my call  

## 2012-05-02 ENCOUNTER — Telehealth: Payer: Self-pay | Admitting: Internal Medicine

## 2012-05-02 NOTE — Telephone Encounter (Signed)
error 

## 2012-05-09 ENCOUNTER — Ambulatory Visit (HOSPITAL_COMMUNITY)
Admission: RE | Admit: 2012-05-09 | Discharge: 2012-05-09 | Disposition: A | Discharge status: Home / Self Care | Payer: 59 | Source: Ambulatory Visit | Attending: Internal Medicine | Admitting: Internal Medicine

## 2012-05-09 ENCOUNTER — Encounter (HOSPITAL_COMMUNITY): Payer: Self-pay

## 2012-05-09 VITALS — BP 140/100 | HR 87 | Ht 78.0 in | Wt 379.0 lb

## 2012-05-09 DIAGNOSIS — H53129 Transient visual loss, unspecified eye: Secondary | ICD-10-CM

## 2012-05-09 DIAGNOSIS — I10 Essential (primary) hypertension: Secondary | ICD-10-CM | POA: Insufficient documentation

## 2012-05-09 DIAGNOSIS — H53121 Transient visual loss, right eye: Secondary | ICD-10-CM

## 2012-05-09 LAB — SEDIMENTATION RATE: Sed Rate: 10 mm/hr (ref 0–16)

## 2012-05-09 NOTE — Assessment & Plan Note (Addendum)
Problems concerning for TIAs/carotid disease will therefore order MRI/MRA brain and neck as well as refer to neurology on Wednesday for follow up on studies.  Will also check ANA, sed rate and RF to rule out inflammatory process.    Patient seen and examined with Ulyess Blossom, PA-C. We discussed all aspects of the encounter. I agree with the assessment and plan as stated above. Transient monocular vision loss of unknown etiology. Unclear if this is migrainous or other form of vascular disease. I have discussed with Dr. Meryle Ready in Neurology and we will arrange for MRI/MRA of the brain and check labs. Susy Frizzle will see him on Wednesday for [place].

## 2012-05-09 NOTE — Patient Instructions (Addendum)
Please have blood work today.  The current medical regimen is effective;  continue present plan and medications.  You are scheduled for an MRI/MRA on Saturday May 14, 2012 at 12:30 (please bring your insurance cards)

## 2012-05-09 NOTE — Progress Notes (Signed)
Primary Care: Dr. Sanda Linger Primary Cardiologist: Dr. Gala Romney   HPI: Alexis [name] is a 26 y.o. gentlemen with history of HTN, HL, obesity and asthma.  He has seen Dr. Gala Romney in the past for HTN.    He was seen by Dr. Ward Givens, last week for losing vision in right eye over the last 3-4 months and have become more frequent.  The episodes last 3-4 minutes at a time.  There is no associated weakness/HA/trouble speaking.  Dr. Ward Givens said his eye looked good but questioned if episodes were possible TIAs.  Started vegetarian diet 2 months ago, FLP nml.    He is here to follow up with Dr. Gala Romney concerning the above.  He feels well today but episodes are increasing to several times a week.  As above, no associated symptoms with transient loss of vision.  Denies chest pain, palpitations.  No fever/chills.     Review of Systems: [y] = yes, [ ]  = no   General: Weight gain [ ] ; Weight loss [ ] ; Anorexia [ ] ; Fatigue [ ] ; Fever [ ] ; Chills [ ] ; Weakness [ ]   Cardiac: Chest pain/pressure [ ] ; Resting SOB [ ] ; Exertional SOB [ ] ; Orthopnea [ ] ; Pedal Edema [ ] ; Palpitations [ ] ; Syncope [ ] ; Presyncope [ ] ; Paroxysmal nocturnal dyspnea[ ]   Pulmonary: Cough [ ] ; Wheezing[ ] ; Hemoptysis[ ] ; Sputum [ ] ; Snoring [ ]   GI: Vomiting[ ] ; Dysphagia[ ] ; Melena[ ] ; Hematochezia [ ] ; Heartburn[ ] ; Abdominal pain [ ] ; Constipation [ ] ; Diarrhea [ ] ; BRBPR [ ]   GU: Hematuria[ ] ; Dysuria [ ] ; Nocturia[ ]   Vascular: Pain in legs with walking [ ] ; Pain in feet with lying flat [ ] ; Non-healing sores [ ] ; Stroke [ ] ; TIA [ ] ; Slurred speech [ ] ;  Neuro: Headaches[ ] ; Vertigo[ ] ; Seizures[ ] ; Paresthesias[ ] ;Blurred vision [ ] ; Diplopia [ ] ; Vision changes [ ]   Ortho/Skin: Arthritis [ ] ; Joint pain [ ] ; Muscle pain [ ] ; Joint swelling [ ] ; Back Pain [ ] ; Rash [ ]   Psych: Depression[ ] ; Anxiety[ ]   Heme: Bleeding problems [ ] ; Clotting disorders [ ] ; Anemia [ ]   Endocrine: Diabetes [ ] ; Thyroid dysfunction[ ]    Past Medical  History  Diagnosis Date  . ALLERGIC RHINITIS 06/10/2009  . ADD 09/13/2007  . ASTHMA, UNSPECIFIED, UNSPECIFIED STATUS 07/19/2008  . DEPRESSIVE DISORDER 09/13/2007  . DYSMETABOLIC SYNDROME 10/25/2009  . GERD 09/13/2007  . HYPERLIPIDEMIA 10/10/2009  . HYPERTENSION 09/13/2007  . IBS 03/20/2009  . Morbid obesity 07/19/2008    Current Outpatient Prescriptions  Medication Sig Dispense Refill  . buPROPion (WELLBUTRIN) 75 MG tablet TAKE 1 TABLET BY MOUTH EVERY DAY **DUE FOR MD APPT**  90 tablet  3  . celecoxib (CELEBREX) 200 MG capsule Take 200 mg by mouth.      . losartan (COZAAR) 100 MG tablet TAKE 1 TABLET BY MOUTH EVERY DAY FOR HIGH BLOOD PRESSURE  90 tablet  1  . omeprazole (PRILOSEC) 40 MG capsule Take 40 mg by mouth 2 (two) times daily.        Allergies  Allergen Reactions  . Amoxicillin-Pot Clavulanate   . Sulfamethoxazole W-Trimethoprim     History   Social History  . Marital Status: Single    Spouse Name: N/A    Number of Children: N/A  . Years of Education: N/A   Occupational History  . Not on file.   Social History Main Topics  . Smoking status: Never Smoker   .  Smokeless tobacco: Never Used  . Alcohol Use: No  . Drug Use: No  . Sexually Active: Not Currently   Other Topics Concern  . Not on file   Social History Narrative  . No narrative on file  He is a paramedic at [employer].   Family History  Problem Relation Age of Onset  . Hypertension Father     PHYSICAL EXAM: Filed Vitals:   05/09/12 1214  BP: 140/100  Pulse: 87  Height: 6\' 6"  (1.981 m)  Weight: 379 lb (171.913 kg)  SpO2: 98%    General:  Well appearing. No respiratory difficulty HEENT: normal Neck: supple. no JVD. Carotids 2+ bilat; no bruits. No lymphadenopathy or thryomegaly appreciated. Cor: PMI nondisplaced. Regular rate & rhythm. No rubs, gallops or murmurs. Lungs: clear Abdomen: obese, soft, nontender, nondistended. No hepatosplenomegaly. No bruits or masses. Good bowel sounds. Extremities:  no cyanosis, clubbing, rash, edema Neuro: alert & oriented x 3, cranial nerves grossly intact. moves all 4 extremities w/o difficulty. Affect pleasant.  ECG: NSR 81 bpm    ASSESSMENT & PLAN:

## 2012-05-10 LAB — ANA: Anti Nuclear Antibody(ANA): NEGATIVE

## 2012-05-11 ENCOUNTER — Ambulatory Visit
Admission: RE | Admit: 2012-05-11 | Discharge: 2012-05-11 | Disposition: A | Discharge status: Home / Self Care | Payer: 59 | Source: Ambulatory Visit | Attending: Physician Assistant | Admitting: Physician Assistant

## 2012-05-11 ENCOUNTER — Encounter: Payer: Self-pay | Admitting: Neurology

## 2012-05-11 ENCOUNTER — Ambulatory Visit (INDEPENDENT_AMBULATORY_CARE_PROVIDER_SITE_OTHER): Payer: 59 | Admitting: Neurology

## 2012-05-11 VITALS — BP 122/88 | HR 84 | Wt 378.0 lb

## 2012-05-11 DIAGNOSIS — H53129 Transient visual loss, unspecified eye: Secondary | ICD-10-CM

## 2012-05-11 DIAGNOSIS — H53121 Transient visual loss, right eye: Secondary | ICD-10-CM

## 2012-05-11 MED ORDER — GADOBENATE DIMEGLUMINE 529 MG/ML IV SOLN
20.0000 mL | Freq: Once | INTRAVENOUS | Status: AC | PRN
  Administered 2012-05-11: 20 mL via INTRAVENOUS

## 2012-05-11 MED ORDER — VERAPAMIL HCL ER 120 MG PO CP24: 360.0000 mg | ORAL_CAPSULE | Freq: Every day | ORAL | Status: DC

## 2012-05-11 NOTE — Progress Notes (Signed)
Dear Dr. Gala Romney,  Thank you for having me see Alexis [name] in consultation today at [facility] for his problem with transient monocular vision loss.  As you may recall, he is a 26 y.o. year old male with a history of obesity and migraine headaches who presents with multiple episodes of monocular vision loss(his vision gets blurry or cloudy so that he can only see light) in his right eye.  It is typically unprovoked and not associated with headache.  It involves the whole eye.  It lasts 2-3 minutes and can occur any time of day.  It occurs 2-3 times per week.  There was no change before this started.  He saw optometry and surprisingly when he had his eye illuminated he lost half of his visual field in the right eye.  It lasted for about 10 minutes.  Interestingly, he had a visual field done in his right and left eye and the right eye was remarkable for a scotoma in his nasal superior visual field near the periphery.  He says it is sudden in onset and in termination.  There are no other neurologic symptoms.  It is not positional.   Past Medical History  Diagnosis Date  . ALLERGIC RHINITIS 06/10/2009  . ADD 09/13/2007  . ASTHMA, UNSPECIFIED, UNSPECIFIED STATUS 07/19/2008  . DEPRESSIVE DISORDER 09/13/2007  . DYSMETABOLIC SYNDROME 10/25/2009  . GERD 09/13/2007  . HYPERLIPIDEMIA 10/10/2009  . HYPERTENSION 09/13/2007  . IBS 03/20/2009  . Morbid obesity 07/19/2008   - patient has a history of headaches as a teenager worse with ADHD medication.  These were described as throbbing and pounding headaches with photophobia and queasiness. Past Surgical History  Procedure Date  . Appendectomy 2004  . Ankle surgery 2007  . Skin cancer excision     teenager    History   Social History  . Marital Status: Single    Spouse Name: N/A    Number of Children: N/A  . Years of Education: N/A   Social History Main Topics  . Smoking status: Never Smoker   . Smokeless tobacco: Never Used  . Alcohol  Use: No     socially  . Drug Use: No  . Sexually Active: Not Currently   Other Topics Concern  . None   Social History Narrative  . None    Family History  Problem Relation Age of Onset  . Hypertension Father   - mother with migraine and visual aura.  Current Outpatient Prescriptions on File Prior to Visit  Medication Sig Dispense Refill  . buPROPion (WELLBUTRIN) 75 MG tablet TAKE 1 TABLET BY MOUTH EVERY DAY **DUE FOR MD APPT**  90 tablet  3  . celecoxib (CELEBREX) 200 MG capsule Take 200 mg by mouth.      . losartan (COZAAR) 100 MG tablet TAKE 1 TABLET BY MOUTH EVERY DAY FOR HIGH BLOOD PRESSURE  90 tablet  1  . omeprazole (PRILOSEC) 40 MG capsule Take 40 mg by mouth 2 (two) times daily.      . verapamil (VERELAN PM) 120 MG 24 hr capsule Take 3 capsules (360 mg total) by mouth at bedtime.  270 capsule  3   No current facility-administered medications on file prior to visit.    Allergies  Allergen Reactions  . Amoxicillin-Pot Clavulanate   . Sulfamethoxazole W-Trimethoprim       ROS:  13 systems were reviewed and are unremarkable.   Examination:  Filed Vitals:   05/11/12 1134  BP: 122/88  Pulse: 84  Weight: 378 lb (171.46 kg)     In general, obese appearing young man.  Cardiovascular: The patient has a regular rate and rhythm and no carotid bruits.  Fundoscopy:  Disks are flat. Vessel caliber within normal limits.  I do not see any segmental narrowing.  Mental status:   The patient is oriented to person, place and time. Recent and remote memory are intact. Attention span and concentration are normal. Language including repetition, naming, following commands are intact. Fund of knowledge of current and historical events, as well as vocabulary are normal.  Cranial Nerves: Pupils are equally round and reactive to light. No APD.  Visual fields full to confrontation. Extraocular movements are intact without nystagmus. Facial sensation and muscles of mastication  are intact. Muscles of facial expression are symmetric. Hearing intact to bilateral finger rub. Tongue protrusion, uvula, palate midline.  Shoulder shrug intact  Motor:  The patient has normal bulk and tone, no pronator drift.  There are no adventitious movements.  5/5 muscle strength bilaterally.  Reflexes:   Biceps  Triceps Brachioradialis Knee Ankle  Right 2+  2+  2+   2+ 2+  Left  2+  2+  2+   2+ 2+  Toes down  Coordination:  Normal finger to nose.  No dysdiadokinesia.  Sensation is intact to temperature and vibration.  Gait and Station are normal.  Tandem gait is intact.  Romberg is negative  MRI Brain and MRA head and neck were unremarkable. ESR and ANA were normal. Impression/Recs: 1.  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  I think it would be unlikely with a negative MRA that this is due to an embolic phenomenon.  I would suggest treatment with verapamil ER 120mg  daily to increase to as much as 480mg  to treat the retinal vasospasm that I think is likely causing this.  Of course he will continue aspirin 81mg  daily.  However, if his spells continue it may make sense for him to see ophthalmology and also repeat his VF testing when he is asymptomatic.  If his VF defect has improved then it would further reinforce that this is a benign reversible phenomenon.  However, if it is the same or worse then this might push [place] to investigate further.  The one thing I cannot explain is the visual distribution of his spell that was brought on by the light shining in his eye.  It would seem to me to be unlikely given the retinal vasculature that the VF defect that he experience would respect the vertical meridian.  However, we will just keep an eye on this and see if he has any other spells that we indicate a post-chiasmal localization.  He will keep me apprised of his episodes  Thank you for having [place] see Alexis [name] in consultation.  Feel free to contact me with any  questions.  Lupita Raider Modesto Charon, MD [facility],  [address] [place], [place] [address] Phone: [phone] Fax: [phone].

## 2012-05-14 ENCOUNTER — Other Ambulatory Visit: Payer: 59

## 2012-05-23 ENCOUNTER — Encounter (HOSPITAL_COMMUNITY): Payer: 59

## 2012-05-27 ENCOUNTER — Telehealth: Payer: Self-pay | Admitting: Neurology

## 2012-05-27 NOTE — Telephone Encounter (Signed)
Left a detailed message for Lesa re: increase Verapamil to 240 mg / day. Call me on Monday and let me know you got this message and that he is tolerating the increase ok.

## 2012-05-27 NOTE — Telephone Encounter (Signed)
Pt went off blood pressure meds per Dr. Modesto Charon when his HA meds were increased. Now he has high blood pressure, so family isn't sure he is going to be able to stay off the BP meds. Please call Leesa and advise.

## 2012-05-27 NOTE — Telephone Encounter (Signed)
Let's increase to verapamil ER 240mg  daily.

## 2012-05-27 NOTE — Telephone Encounter (Signed)
Called and spoke with Alexis [name], the patient's mother. She states Alexis [name] called her today to say his BP was 170/110. She states Dr. Modesto Charon stopped his Cozaar when he was last seen (July 31) and started him on Verapamil ER 120 mg daily. She reports his vision and "spells" are much better; no more spells or vision loss. Now they just need to know if Dr. Modesto Charon wants to increase the Verapamil. Alexis [name] also states that he had been under a little more stress as well which could also be a contributing factor in his elevated BP.  I told her that I would let Dr. Modesto Charon know his situation and see what he recommends and let her know. She is fine with this plan. [facility] needed [facility] [address]. **Dr. Modesto Charon, please advise med increase. Thanks.

## 2012-05-30 ENCOUNTER — Encounter: Payer: Self-pay | Admitting: Neurology

## 2012-07-18 ENCOUNTER — Telehealth: Payer: Self-pay | Admitting: Internal Medicine

## 2012-07-18 MED ORDER — AZITHROMYCIN 500 MG PO TABS: 500.0000 mg | ORAL_TABLET | Freq: Every day | ORAL | Status: DC

## 2012-07-18 NOTE — Telephone Encounter (Signed)
Patient notified/LOVM

## 2012-07-18 NOTE — Telephone Encounter (Signed)
Caller: Misty Stanley Knaus/Mother; Patient Name: Alexis [name]; PCP: Sanda Linger (Adults only); Best Callback Phone Number: [phone]; Call regarding Chest Congestion, green sputum,  onset 10-3.  Mucinex not improving symptoms.  Last seen on 7-31. All emergent symptoms ruled out per Cough Protocol, see in 24 hours due to productive cough with colored sputum.  Appointment offered. Pt no longer has insurance, Mom requesting Zpack. Mom would like call back to discuss office cost with no insurance.  [facility], [address], [phone].

## 2012-07-18 NOTE — Telephone Encounter (Signed)
Zpak Rx was sent to his pharmacy

## 2012-07-22 ENCOUNTER — Other Ambulatory Visit: Payer: Self-pay | Admitting: Internal Medicine

## 2012-07-28 ENCOUNTER — Other Ambulatory Visit: Payer: Self-pay | Admitting: Internal Medicine

## 2012-08-05 ENCOUNTER — Other Ambulatory Visit: Payer: Self-pay

## 2012-08-05 MED ORDER — LOSARTAN POTASSIUM 100 MG PO TABS: ORAL_TABLET | ORAL | Status: DC

## 2012-09-01 ENCOUNTER — Other Ambulatory Visit: Payer: Self-pay

## 2012-09-01 MED ORDER — OMEPRAZOLE 40 MG PO CPDR: 40.0000 mg | DELAYED_RELEASE_CAPSULE | Freq: Two times a day (BID) | ORAL | Status: DC

## 2012-09-04 ENCOUNTER — Emergency Department (HOSPITAL_COMMUNITY): Payer: PRIVATE HEALTH INSURANCE

## 2012-09-04 ENCOUNTER — Encounter (HOSPITAL_COMMUNITY): Payer: Self-pay | Admitting: Nurse Practitioner

## 2012-09-04 ENCOUNTER — Emergency Department (HOSPITAL_COMMUNITY)
Admission: EM | Admit: 2012-09-04 | Discharge: 2012-09-04 | Disposition: A | Discharge status: Home / Self Care | Payer: PRIVATE HEALTH INSURANCE | Attending: Emergency Medicine | Admitting: Emergency Medicine

## 2012-09-04 DIAGNOSIS — K219 Gastro-esophageal reflux disease without esophagitis: Secondary | ICD-10-CM | POA: Insufficient documentation

## 2012-09-04 DIAGNOSIS — Z791 Long term (current) use of non-steroidal anti-inflammatories (NSAID): Secondary | ICD-10-CM | POA: Insufficient documentation

## 2012-09-04 DIAGNOSIS — Y9389 Activity, other specified: Secondary | ICD-10-CM | POA: Insufficient documentation

## 2012-09-04 DIAGNOSIS — I10 Essential (primary) hypertension: Secondary | ICD-10-CM | POA: Insufficient documentation

## 2012-09-04 DIAGNOSIS — M2550 Pain in unspecified joint: Secondary | ICD-10-CM | POA: Insufficient documentation

## 2012-09-04 DIAGNOSIS — Z862 Personal history of diseases of the blood and blood-forming organs and certain disorders involving the immune mechanism: Secondary | ICD-10-CM | POA: Insufficient documentation

## 2012-09-04 DIAGNOSIS — Z79899 Other long term (current) drug therapy: Secondary | ICD-10-CM | POA: Insufficient documentation

## 2012-09-04 DIAGNOSIS — S63509A Unspecified sprain of unspecified wrist, initial encounter: Secondary | ICD-10-CM | POA: Insufficient documentation

## 2012-09-04 DIAGNOSIS — F988 Other specified behavioral and emotional disorders with onset usually occurring in childhood and adolescence: Secondary | ICD-10-CM | POA: Insufficient documentation

## 2012-09-04 DIAGNOSIS — E785 Hyperlipidemia, unspecified: Secondary | ICD-10-CM | POA: Insufficient documentation

## 2012-09-04 DIAGNOSIS — Z8719 Personal history of other diseases of the digestive system: Secondary | ICD-10-CM | POA: Insufficient documentation

## 2012-09-04 DIAGNOSIS — Z85828 Personal history of other malignant neoplasm of skin: Secondary | ICD-10-CM | POA: Insufficient documentation

## 2012-09-04 DIAGNOSIS — J45909 Unspecified asthma, uncomplicated: Secondary | ICD-10-CM | POA: Insufficient documentation

## 2012-09-04 DIAGNOSIS — S63501A Unspecified sprain of right wrist, initial encounter: Secondary | ICD-10-CM

## 2012-09-04 DIAGNOSIS — Y929 Unspecified place or not applicable: Secondary | ICD-10-CM | POA: Insufficient documentation

## 2012-09-04 DIAGNOSIS — Z8659 Personal history of other mental and behavioral disorders: Secondary | ICD-10-CM | POA: Insufficient documentation

## 2012-09-04 DIAGNOSIS — Z7982 Long term (current) use of aspirin: Secondary | ICD-10-CM | POA: Insufficient documentation

## 2012-09-04 DIAGNOSIS — X500XXA Overexertion from strenuous movement or load, initial encounter: Secondary | ICD-10-CM | POA: Insufficient documentation

## 2012-09-04 DIAGNOSIS — Z8639 Personal history of other endocrine, nutritional and metabolic disease: Secondary | ICD-10-CM | POA: Insufficient documentation

## 2012-09-04 NOTE — ED Notes (Signed)
Pt was performing CPR on Friday and has been having R wrist pain since. Reports pain was minimal on Friday but has become increasingly worse since. CMS intact, pain increased with gripping.

## 2012-09-04 NOTE — Progress Notes (Signed)
Orthopedic Tech Progress Note Patient Details:  Alexis [name] [birth year] [id number]  Patient ID: Alexis [name], male   DOB: 04-28-1986, 26 y.o.   MRN: [id number] Viewed order from rn order list  Nikki Dom 09/04/2012, 1:31 PM

## 2012-09-04 NOTE — Progress Notes (Signed)
Orthopedic Tech Progress Note Patient Details:  Alexis [name] 08/18/1986 [id number]  Ortho Devices Type of Ortho Device: Velcro wrist splint Ortho Device/Splint Location: right wrist Ortho Device/Splint Interventions: Application   [name] 09/04/2012, 1:31 PM

## 2012-09-04 NOTE — ED Provider Notes (Signed)
History  This chart was scribed for Alexis Lyons, MD by Shari Heritage, ED Scribe. The patient was seen in room TR11C/TR11C. Patient's care was started at 1203.   CSN: [id number]  Arrival date & time 09/04/12  1153   First MD Initiated Contact with Patient 09/04/12 1203      Chief Complaint  Patient presents with  . Wrist Pain    The history is provided by the patient. No language interpreter was used.   HPI Comments: IDO GLASSON is a 26 y.o. male who presents to the Emergency Department complaining of moderate to severe, constant, dull, non-radiating right wrist and thumb pain onset 2 days ago. 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 He noticed some wrist soreness at the time and since then pain has gradually worsened. Patient also reports that he is having difficulty moving his wrist at the joint. Patient states that pain is worse with with gripping. Patient reports no relieving factors. Patient has a medical history of asthma, GERD, HTN, IBS and hyperlipidemia. Patient does not smoke and drinks alcohol occasionally.    Past Medical History  Diagnosis Date  . ALLERGIC RHINITIS 06/10/2009  . ADD 09/13/2007  . ASTHMA, UNSPECIFIED, UNSPECIFIED STATUS 07/19/2008  . DEPRESSIVE DISORDER 09/13/2007  . DYSMETABOLIC SYNDROME 10/25/2009  . GERD 09/13/2007  . HYPERLIPIDEMIA 10/10/2009  . HYPERTENSION 09/13/2007  . IBS 03/20/2009  . Morbid obesity 07/19/2008    Past Surgical History  Procedure Date  . Appendectomy 2004  . Ankle surgery 2007  . Skin cancer excision     teenager    Family History  Problem Relation Age of Onset  . Hypertension Father     History  Substance Use Topics  . Smoking status: Never Smoker   . Smokeless tobacco: Never Used  . Alcohol Use: No     Comment: socially      Review of Systems  Musculoskeletal: Positive for arthralgias.  All other systems reviewed and are negative.    Allergies  Amoxicillin-pot clavulanate and  Sulfamethoxazole w-trimethoprim  Home Medications   Current Outpatient Rx  Name  Route  Sig  Dispense  Refill  . ALPRAZOLAM 1 MG PO TABS   Oral   Take 1 mg by mouth as needed.         . ASPIRIN 81 MG PO TABS   Oral   Take 81 mg by mouth daily.         . AZITHROMYCIN 500 MG PO TABS      TAKE 1 TABLET BY MOUTH DAILY.   3 tablet   0   . BUPROPION HCL 75 MG PO TABS      TAKE 1 TABLET BY MOUTH EVERY DAY **DUE FOR MD APPT**   90 tablet   3   . CELECOXIB 200 MG PO CAPS   Oral   Take 200 mg by mouth.         Marland Kitchen LOSARTAN POTASSIUM 100 MG PO TABS      TAKE 1 TABLET BY MOUTH EVERY DAY FOR HIGH BLOOD PRESSURE   90 tablet   1   . OMEPRAZOLE 40 MG PO CPDR   Oral   Take 1 capsule (40 mg total) by mouth 2 (two) times daily.   180 capsule   1   . VERAPAMIL HCL ER 120 MG PO CP24   Oral   Take 3 capsules (360 mg total) by mouth at bedtime.   270 capsule   3  Triage Vitals: BP 152/103  Pulse 85  Temp 99.1 F (37.3 C) (Oral)  Resp 20  SpO2 98%  Physical Exam  Constitutional: He is oriented to person, place, and time. He appears well-developed and well-nourished. No distress.  HENT:  Head: Normocephalic and atraumatic.  Eyes: EOM are normal.  Neck: Normal range of motion.  Cardiovascular: Normal rate.   Pulmonary/Chest: Effort normal.  Musculoskeletal:       Right wrist: He exhibits swelling. He exhibits no deformity.       Right wrist has mild swelling over the thenar eminence and radial aspect of the wrist. No gross deformity. Good ROM. Pulse, motor and sensory intact distally.   Neurological: He is alert and oriented to person, place, and time.  Skin: Skin is dry.  Psychiatric: He has a normal mood and affect. His behavior is normal.    ED Course  Procedures (including critical care time) DIAGNOSTIC STUDIES: Oxygen Saturation is 98% on room air, normal by my interpretation.    COORDINATION OF CARE: 12:14 PM- Patient informed of current plan for  treatment and evaluation and agrees with plan at this time. Will order x-rays of right hand and wrist to rule out fracture.   Dg Wrist Complete Right  09/04/2012  *RADIOLOGY REPORT*  Clinical Data: Knot on the lateral aspect of the wrist.  RIGHT WRIST - COMPLETE 3+ VIEW  Comparison: None.  Findings: The tiny well corticated bony fragment is seen off the radial styloid likely due to old trauma.  No acute bony or joint abnormality is identified.  Soft tissue structures are unremarkable.  IMPRESSION: No acute finding.   Original Report Authenticated By: Holley Dexter, M.D.    Dg Hand Complete Right  09/04/2012  *RADIOLOGY REPORT*  Clinical Data: Pain.  Knot on the lateral aspect of the wrist.  RIGHT HAND - COMPLETE 3+ VIEW  Comparison: None.  Findings: Imaged bones, joints and soft tissues appear normal.  IMPRESSION: Negative study.   Original Report Authenticated By: Holley Dexter, M.D.      No diagnosis found.    MDM  The xrays reveal a small bony fragment at the radial styloid which is likely old but is at the site of the injury.  He will be immobilized in a splint, follow up prn.      I personally performed the services described in this documentation, which was scribed in my presence. The recorded information has been reviewed and is accurate.      Alexis Lyons, MD 09/04/12 [phone]

## 2012-09-04 NOTE — ED Notes (Signed)
Pt returns from xray

## 2012-10-06 ENCOUNTER — Observation Stay (HOSPITAL_COMMUNITY)
Admission: EM | Admit: 2012-10-06 | Discharge: 2012-10-06 | Disposition: A | Discharge status: Home / Self Care | Payer: Self-pay | Attending: Emergency Medicine | Admitting: Emergency Medicine

## 2012-10-06 ENCOUNTER — Encounter (HOSPITAL_COMMUNITY): Payer: Self-pay | Admitting: *Deleted

## 2012-10-06 DIAGNOSIS — F3289 Other specified depressive episodes: Secondary | ICD-10-CM | POA: Insufficient documentation

## 2012-10-06 DIAGNOSIS — Z8719 Personal history of other diseases of the digestive system: Secondary | ICD-10-CM | POA: Insufficient documentation

## 2012-10-06 DIAGNOSIS — K219 Gastro-esophageal reflux disease without esophagitis: Secondary | ICD-10-CM | POA: Insufficient documentation

## 2012-10-06 DIAGNOSIS — Z862 Personal history of diseases of the blood and blood-forming organs and certain disorders involving the immune mechanism: Secondary | ICD-10-CM | POA: Insufficient documentation

## 2012-10-06 DIAGNOSIS — E785 Hyperlipidemia, unspecified: Secondary | ICD-10-CM | POA: Insufficient documentation

## 2012-10-06 DIAGNOSIS — J45909 Unspecified asthma, uncomplicated: Secondary | ICD-10-CM | POA: Insufficient documentation

## 2012-10-06 DIAGNOSIS — Z8639 Personal history of other endocrine, nutritional and metabolic disease: Secondary | ICD-10-CM | POA: Insufficient documentation

## 2012-10-06 DIAGNOSIS — I10 Essential (primary) hypertension: Secondary | ICD-10-CM | POA: Insufficient documentation

## 2012-10-06 DIAGNOSIS — Z7982 Long term (current) use of aspirin: Secondary | ICD-10-CM | POA: Insufficient documentation

## 2012-10-06 DIAGNOSIS — F988 Other specified behavioral and emotional disorders with onset usually occurring in childhood and adolescence: Secondary | ICD-10-CM | POA: Insufficient documentation

## 2012-10-06 DIAGNOSIS — R220 Localized swelling, mass and lump, head: Principal | ICD-10-CM | POA: Insufficient documentation

## 2012-10-06 DIAGNOSIS — Z79899 Other long term (current) drug therapy: Secondary | ICD-10-CM | POA: Insufficient documentation

## 2012-10-06 DIAGNOSIS — R0602 Shortness of breath: Secondary | ICD-10-CM | POA: Insufficient documentation

## 2012-10-06 DIAGNOSIS — R221 Localized swelling, mass and lump, neck: Secondary | ICD-10-CM

## 2012-10-06 DIAGNOSIS — F329 Major depressive disorder, single episode, unspecified: Secondary | ICD-10-CM | POA: Insufficient documentation

## 2012-10-06 LAB — RAPID STREP SCREEN (MED CTR MEBANE ONLY): Streptococcus, Group A Screen (Direct): NEGATIVE

## 2012-10-06 MED ORDER — AZITHROMYCIN 250 MG PO TABS: ORAL_TABLET | ORAL | Status: DC

## 2012-10-06 MED ORDER — PREDNISONE 20 MG PO TABS: 60.0000 mg | ORAL_TABLET | Freq: Every day | ORAL | Status: DC

## 2012-10-06 MED ORDER — METHYLPREDNISOLONE SODIUM SUCC 125 MG IJ SOLR
125.0000 mg | Freq: Once | INTRAMUSCULAR | Status: AC
  Administered 2012-10-06: 125 mg via INTRAVENOUS
  Filled 2012-10-06: qty 2

## 2012-10-06 MED ORDER — DIPHENHYDRAMINE HCL 25 MG PO CAPS
50.0000 mg | ORAL_CAPSULE | Freq: Four times a day (QID) | ORAL | Status: DC | PRN
  Administered 2012-10-06: 50 mg via ORAL
  Filled 2012-10-06: qty 2

## 2012-10-06 MED ORDER — FAMOTIDINE 20 MG PO TABS
20.0000 mg | ORAL_TABLET | Freq: Once | ORAL | Status: AC
Start: 2012-10-06 — End: 2012-10-06
  Administered 2012-10-06: 20 mg via ORAL
  Filled 2012-10-06: qty 1

## 2012-10-06 MED ORDER — AMOXICILLIN-POT CLAVULANATE 875-125 MG PO TABS: 1.0000 | ORAL_TABLET | Freq: Two times a day (BID) | ORAL | Status: DC

## 2012-10-06 NOTE — ED Notes (Signed)
Up to restroom.

## 2012-10-06 NOTE — ED Notes (Signed)
Pt here today for "throat swelling". Denies pain. Orders to move to CDU.

## 2012-10-06 NOTE — ED Provider Notes (Signed)
Pt seen originally by Dr. Ranae Palms and was treated for angioedema vs infection. He has been monitored for 4 hours and has much improvement in his symptoms and decreased swelling.   He tolerated PO fluids and solids in the ED with no difficulty.   Rx: Augmenting and Prednisone  Pt has been advised of the symptoms that warrant their return to the ED. Patient has voiced understanding and has agreed to follow-up with the PCP or specialist.   Dorthula Matas, PA 10/06/12 1640

## 2012-10-06 NOTE — ED Provider Notes (Signed)
History  This chart was scribed for Loren Racer, MD by Bennett Scrape, ED Scribe. This patient was seen in room TR04C/TR04C and the patient's care was started at 11:36 AM.  CSN: [id number]  Arrival date & time 10/06/12  1122   First MD Initiated Contact with Patient 10/06/12 1136      No chief complaint on file.   The history is provided by the patient. No language interpreter was used.    Alexis [name] is a 26 y.o. male who presents to the Emergency Department complaining of 4 to 5 hours of gradual onset, gradually worsening, constant throat swelling with associated mild SOB that he woke up with. He reports taking two xanax and two Advil for a migraine yesterday but reports that this is a normal routine for him and he denies having prior complications with these medications. He denies having trouble swallowing. He denies having known contact with allergens. He denies fevers, chills, and sore throat as associated symptoms. He has a h/o GERD, HTN and HLD. He denies smoking and alcohol use.  Past Medical History  Diagnosis Date  . ALLERGIC RHINITIS 06/10/2009  . ADD 09/13/2007  . ASTHMA, UNSPECIFIED, UNSPECIFIED STATUS 07/19/2008  . DEPRESSIVE DISORDER 09/13/2007  . DYSMETABOLIC SYNDROME 10/25/2009  . GERD 09/13/2007  . HYPERLIPIDEMIA 10/10/2009  . HYPERTENSION 09/13/2007  . IBS 03/20/2009  . Morbid obesity 07/19/2008    Past Surgical History  Procedure Date  . Appendectomy 2004  . Ankle surgery 2007  . Skin cancer excision     teenager    Family History  Problem Relation Age of Onset  . Hypertension Father     History  Substance Use Topics  . Smoking status: Never Smoker   . Smokeless tobacco: Never Used  . Alcohol Use: No     Comment: socially      Review of Systems  Constitutional: Negative for fever and chills.  HENT: Negative for congestion, sore throat and trouble swallowing.   Respiratory: Positive for shortness of breath (mild). Negative for cough.   All  other systems reviewed and are negative.    Allergies  Citrus; Amoxicillin-pot clavulanate; and Sulfamethoxazole w-trimethoprim  Home Medications   Current Outpatient Rx  Name  Route  Sig  Dispense  Refill  . ALPRAZOLAM 1 MG PO TABS   Oral   Take 1 mg by mouth as needed. For anxiety         . ASPIRIN EC 81 MG PO TBEC   Oral   Take 81 mg by mouth daily.         . BUPROPION HCL 75 MG PO TABS   Oral   Take 75 mg by mouth daily.         . CELECOXIB 200 MG PO CAPS   Oral   Take 200 mg by mouth.         Marland Kitchen VITAMIN D 1000 UNITS PO TABS   Oral   Take 1,000 Units by mouth daily.         Marland Kitchen LOSARTAN POTASSIUM 100 MG PO TABS   Oral   Take 100 mg by mouth daily.         Marland Kitchen OMEPRAZOLE 40 MG PO CPDR   Oral   Take 1 capsule (40 mg total) by mouth 2 (two) times daily.   180 capsule   1   . VERAPAMIL HCL ER 120 MG PO TBCR   Oral   Take 120 mg by mouth daily.         Marland Kitchen  AZITHROMYCIN 250 MG PO TABS      2 tabs on day one, 1 tab 1 day for the remaining 5 days   6 each   0   . PREDNISONE 20 MG PO TABS   Oral   Take 3 tablets (60 mg total) by mouth daily.   15 tablet   0     Take first dose tomorrow     Triage Vitals: BP 140/87  Pulse 91  Temp 97.9 F (36.6 C) (Oral)  Resp 14  SpO2 96%  Physical Exam  Nursing note and vitals reviewed. Constitutional: He is oriented to person, place, and time. He appears well-developed and well-nourished. No distress.  HENT:  Head: Normocephalic and atraumatic.       Edematous and edematous uvula, handling secretions, mild bilateral tonsillar hypertrophy, no tonsillar erythema or exudates   Eyes: Conjunctivae normal and EOM are normal.  Neck: Neck supple. No tracheal deviation present.  Cardiovascular: Normal rate and regular rhythm.   No murmur heard. Pulmonary/Chest: Effort normal and breath sounds normal. No stridor. No respiratory distress.  Musculoskeletal: Normal range of motion.  Lymphadenopathy:    He has no  cervical adenopathy.  Neurological: He is alert and oriented to person, place, and time.  Skin: Skin is warm and dry.  Psychiatric: He has a normal mood and affect. His behavior is normal.    ED Course  Procedures (including critical care time)  DIAGNOSTIC STUDIES: Oxygen Saturation is 96% on room air, adequate by my interpretation.    COORDINATION OF CARE: 11:43 AM- Discussed treatment plan which includes benadryl with pt at bedside and pt agreed to plan.  12:01 PM-Advised pt to hold his losartan until he sees his PCP due to the angioedema symptoms.  Labs Reviewed  RAPID STREP SCREEN  LAB REPORT - SCANNED   No results found.   1. Throat swelling       MDM  I personally performed the services described in this documentation, which was scribed in my presence. The recorded information has been reviewed and is accurate.      Loren Racer, MD 10/28/12 [phone]

## 2012-10-06 NOTE — Progress Notes (Signed)
Utilization review completed.  P.J. Ladarrius Bogdanski,RN,BSN Case Manager [phone]  

## 2012-10-06 NOTE — ED Provider Notes (Signed)
Medical screening examination/treatment/procedure(s) were performed by non-physician practitioner and as supervising physician I was immediately available for consultation/collaboration.   Chane Magner, MD 10/06/12 1939 

## 2012-10-06 NOTE — ED Notes (Signed)
Woke up with swollen throat, and making it harder to breathe. Uvula swollen in back of throat. No fevers/chills

## 2012-10-06 NOTE — ED Notes (Signed)
O2 Sat 99% on room air NAD noted at this time. Vitals stable

## 2012-10-06 NOTE — ED Notes (Signed)
Given ginger ale 

## 2012-10-06 NOTE — ED Notes (Signed)
Denies pain or discomfort vitals are stable

## 2012-10-06 NOTE — ED Notes (Signed)
Patient discharged with his family prescriptions given and patient verbalized an understanding.

## 2012-11-26 ENCOUNTER — Other Ambulatory Visit: Payer: Self-pay

## 2012-12-01 ENCOUNTER — Ambulatory Visit (INDEPENDENT_AMBULATORY_CARE_PROVIDER_SITE_OTHER): Payer: BC Managed Care – PPO | Admitting: Internal Medicine

## 2012-12-01 ENCOUNTER — Encounter: Payer: Self-pay | Admitting: Internal Medicine

## 2012-12-01 VITALS — BP 128/80 | HR 78 | Temp 97.8°F | Ht 78.0 in | Wt 390.0 lb

## 2012-12-01 DIAGNOSIS — G43109 Migraine with aura, not intractable, without status migrainosus: Secondary | ICD-10-CM

## 2012-12-01 DIAGNOSIS — R3911 Hesitancy of micturition: Secondary | ICD-10-CM

## 2012-12-01 DIAGNOSIS — E785 Hyperlipidemia, unspecified: Secondary | ICD-10-CM

## 2012-12-01 DIAGNOSIS — K219 Gastro-esophageal reflux disease without esophagitis: Secondary | ICD-10-CM

## 2012-12-01 DIAGNOSIS — M722 Plantar fascial fibromatosis: Secondary | ICD-10-CM

## 2012-12-01 DIAGNOSIS — F329 Major depressive disorder, single episode, unspecified: Secondary | ICD-10-CM

## 2012-12-01 DIAGNOSIS — I10 Essential (primary) hypertension: Secondary | ICD-10-CM

## 2012-12-01 DIAGNOSIS — F32A Depression, unspecified: Secondary | ICD-10-CM

## 2012-12-01 DIAGNOSIS — G43809 Other migraine, not intractable, without status migrainosus: Secondary | ICD-10-CM

## 2012-12-01 DIAGNOSIS — T7840XA Allergy, unspecified, initial encounter: Secondary | ICD-10-CM

## 2012-12-01 LAB — POCT URINALYSIS DIPSTICK
Leukocytes, UA: NEGATIVE
Nitrite, UA: NEGATIVE
Protein, UA: NEGATIVE
Urobilinogen, UA: 0.2
pH, UA: 6

## 2012-12-01 MED ORDER — VERAPAMIL HCL ER 120 MG PO TBCR: 120.0000 mg | EXTENDED_RELEASE_TABLET | Freq: Every day | ORAL | Status: DC

## 2012-12-01 MED ORDER — BUPROPION HCL 75 MG PO TABS: 75.0000 mg | ORAL_TABLET | Freq: Every day | ORAL | Status: DC

## 2012-12-01 MED ORDER — CELECOXIB 200 MG PO CAPS
200.0000 mg | ORAL_CAPSULE | Freq: Every day | ORAL | Status: DC
Start: 2012-12-01 — End: 2013-12-12

## 2012-12-01 MED ORDER — LOSARTAN POTASSIUM 100 MG PO TABS: 100.0000 mg | ORAL_TABLET | Freq: Every day | ORAL | Status: DC

## 2012-12-01 MED ORDER — ALPRAZOLAM 1 MG PO TABS: 1.0000 mg | ORAL_TABLET | ORAL | Status: DC | PRN

## 2012-12-01 MED ORDER — OMEPRAZOLE 40 MG PO CPDR: 40.0000 mg | DELAYED_RELEASE_CAPSULE | Freq: Two times a day (BID) | ORAL | Status: DC

## 2012-12-01 NOTE — Assessment & Plan Note (Signed)
Diet controlled.  

## 2012-12-01 NOTE — Assessment & Plan Note (Signed)
Current medication regimen effective Refilled Wellbutrin and Xanax

## 2012-12-01 NOTE — Progress Notes (Signed)
Subjective:    Patient ID: Alexis [name], male    DOB: 1986/04/13, 27 y.o.   MRN: [id number]  HPI  Pt presents to the clinic today with c/o allergic reactions. He has had numerous allergic reaction in the past but he feels like they are more frequent. 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 He avoids that. His most recent attack was in December 2013. He is unsure what set it off. He was seen and treated at the ED. He request allergy testing referral today. Additionally, he thinks he may have sinus infection today. He has had a slight headache, and a little bit of a runny nose. He does cough up somethick green mucous but only first thing in the morning. He denies fever. He has had sick contacts. He also c/o urinary frequency and tingling with urination. This started 3 days ago. He is unsure if he has a UTI. He has had lower back pain. He denies nausea, vomiting or chills. He has never had a UTI in the past. Pt request refills of all his medications. Review of Systems  Past Medical History  Diagnosis Date  . ALLERGIC RHINITIS 06/10/2009  . ADD 09/13/2007  . ASTHMA, UNSPECIFIED, UNSPECIFIED STATUS 07/19/2008  . DEPRESSIVE DISORDER 09/13/2007  . DYSMETABOLIC SYNDROME 10/25/2009  . GERD 09/13/2007  . HYPERLIPIDEMIA 10/10/2009  . HYPERTENSION 09/13/2007  . IBS 03/20/2009  . Morbid obesity 07/19/2008    Current Outpatient Prescriptions  Medication Sig Dispense Refill  . ALPRAZolam (XANAX) 1 MG tablet Take 1 mg by mouth as needed. For anxiety      . aspirin EC 81 MG tablet Take 81 mg by mouth daily.      Marland Kitchen buPROPion (WELLBUTRIN) 75 MG tablet Take 75 mg by mouth daily.      . celecoxib (CELEBREX) 200 MG capsule Take 200 mg by mouth.      . cholecalciferol (VITAMIN D) 1000 UNITS tablet Take 1,000 Units by mouth daily.      Marland Kitchen losartan (COZAAR) 100 MG tablet Take 100 mg by mouth daily.      Marland Kitchen omeprazole (PRILOSEC) 40 MG capsule Take 1 capsule (40 mg total) by mouth 2 (two) times daily.  180 capsule  1  .  verapamil (CALAN-SR) 120 MG CR tablet Take 120 mg by mouth daily.       No current facility-administered medications for this visit.    Allergies  Allergen Reactions  . Citrus Anaphylaxis and Other (See Comments)    Lemon  . Amoxicillin-Pot Clavulanate   . Sulfamethoxazole W-Trimethoprim     Family History  Problem Relation Age of Onset  . Hypertension Father     History   Social History  . Marital Status: Single    Spouse Name: N/A    Number of Children: N/A  . Years of Education: N/A   Occupational History  . Not on file.   Social History Main Topics  . Smoking status: Never Smoker   . Smokeless tobacco: Never Used  . Alcohol Use: No     Comment: socially  . Drug Use: No  . Sexually Active: Not Currently   Other Topics Concern  . Not on file   Social History Narrative  . No narrative on file     Constitutional: Pt reports headache. Denies fever, malaise, fatigue, or abrupt weight changes.  HEENT: Denies eye pain, eye redness, ear pain, ringing in the ears, wax buildup, runny nose, nasal congestion, bloody nose, or sore throat. Respiratory: Pt  reports slight cough. Denies difficulty breathing, shortness of breath,or sputum production.   Cardiovascular:  Denies chest pain, chest tightness, palpitations or swelling in the hands or feet.  Gastrointestinal: Denies abdominal pain, bloating, constipation, diarrhea or blood in the stool.  GU: Pt reports frequency and tingling with urination. Denies urgency, pain with urination, burning sensation, blood in urine, odor or discharge.    No other specific complaints in a complete review of systems (except as listed in HPI above).     Objective:   Physical Exam    BP 128/80  Pulse 78  Temp(Src) 97.8 F (36.6 C) (Oral)  Ht 6\' 6"  (1.981 m)  Wt 390 lb (176.903 kg)  BMI 45.08 kg/m2  SpO2 99% Wt Readings from Last 3 Encounters:  12/01/12 390 lb (176.903 kg)  05/11/12 378 lb (171.46 kg)  05/09/12 379 lb  (171.913 kg)    General: Appears his stated age, obese but well developed, well nourished in NAD. HEENT: Head: normal shape and size; Eyes: sclera white, no icterus, conjunctiva pink, PERRLA and EOMs intact; Ears: Tm's gray and intact, normal light reflex; Nose: mucosa pink and moist, septum midline; Throat/Mouth: Teeth present, mucosa pink and moist, no exudate, lesions or ulcerations noted.   Cardiovascular: Normal rate and rhythm. S1,S2 noted.  No murmur, rubs or gallops noted. No JVD or BLE edema. No carotid bruits noted. Pulmonary/Chest: Normal effort and positive vesicular breath sounds. No respiratory distress. No wheezes, rales or ronchi noted.  Abdomen: Soft and nontender. Normal bowel sounds, no bruits noted. No distention or masses noted. Liver, spleen and kidneys non palpable. No CVA tenderness. Neurological: Alert and oriented. Cranial nerves II-XII intact. Coordination normal. +DTRs bilaterally.        Assessment & Plan:   Allergic Reactions, additional workup required:  Avoid triggers  Will refer for allergy testing  Cystitis, new onset with additional workup required:  Urinalysis- neg Increase fluid intake over the next few days If still bothersome, can refer to urology  RTC as needed or if symptoms persist

## 2012-12-01 NOTE — Assessment & Plan Note (Addendum)
Current medication effective Refilled Losartan today

## 2012-12-01 NOTE — Patient Instructions (Signed)
Allergies, Generic  Allergies may happen from anything your body is sensitive to. This may be food, medicines, pollens, chemicals, and nearly anything around you in everyday life that produces allergens. An allergen is anything that causes an allergy producing substance. Heredity is often a factor in causing these problems. This means you may have some of the same allergies as your parents.  Food allergies happen in all age groups. Food allergies are some of the most severe and life threatening. Some common food allergies are cow's milk, seafood, eggs, nuts, wheat, and soybeans.  SYMPTOMS    Swelling around the mouth.   An itchy red rash or hives.   Vomiting or diarrhea.   Difficulty breathing.  SEVERE ALLERGIC REACTIONS ARE LIFE-THREATENING.  This reaction is called anaphylaxis. It can cause the mouth and throat to swell and cause difficulty with breathing and swallowing. In severe reactions only a trace amount of food (for example, peanut oil in a salad) may cause death within seconds.  Seasonal allergies occur in all age groups. These are seasonal because they usually occur during the same season every year. They may be a reaction to molds, grass pollens, or tree pollens. Other causes of problems are house dust mite allergens, pet dander, and mold spores. The symptoms often consist of nasal congestion, a runny itchy nose associated with sneezing, and tearing itchy eyes. There is often an associated itching of the mouth and ears. The problems happen when you come in contact with pollens and other allergens. Allergens are the particles in the air that the body reacts to with an allergic reaction. This causes you to release allergic antibodies. Through a chain of events, these eventually cause you to release histamine into the blood stream. Although it is meant to be protective to the body, it is this release that causes your discomfort. This is why you were given anti-histamines to feel better. If you are  unable to pinpoint the offending allergen, it may be determined by skin or blood testing. Allergies cannot be cured but can be controlled with medicine.  Hay fever is a collection of all or some of the seasonal allergy problems. It may often be treated with simple over-the-counter medicine such as diphenhydramine. Take medicine as directed. Do not drink alcohol or drive while taking this medicine. Check with your caregiver or package insert for child dosages.  If these medicines are not effective, there are many new medicines your caregiver can prescribe. Stronger medicine such as nasal spray, eye drops, and corticosteroids may be used if the first things you try do not work well. Other treatments such as immunotherapy or desensitizing injections can be used if all else fails. Follow up with your caregiver if problems continue. These seasonal allergies are usually not life threatening. They are generally more of a nuisance that can often be handled using medicine.  HOME CARE INSTRUCTIONS    If unsure what causes a reaction, keep a diary of foods eaten and symptoms that follow. Avoid foods that cause reactions.   If hives or rash are present:   Take medicine as directed.   You may use an over-the-counter antihistamine (diphenhydramine) for hives and itching as needed.   Apply cold compresses (cloths) to the skin or take baths in cool water. Avoid hot baths or showers. Heat will make a rash and itching worse.   If you are severely allergic:   Following a treatment for a severe reaction, hospitalization is often required for closer follow-up.     Wear a medic-alert bracelet or necklace stating the allergy.   You and your family must learn how to give adrenaline or use an anaphylaxis kit.   If you have had a severe reaction, always carry your anaphylaxis kit or EpiPen with you. Use this medicine as directed by your caregiver if a severe reaction is occurring. Failure to do so could have a fatal outcome.  SEEK  MEDICAL CARE IF:   You suspect a food allergy. Symptoms generally happen within 30 minutes of eating a food.   Your symptoms have not gone away within 2 days or are getting worse.   You develop new symptoms.   You want to retest yourself or your child with a food or drink you think causes an allergic reaction. Never do this if an anaphylactic reaction to that food or drink has happened before. Only do this under the care of a caregiver.  SEEK IMMEDIATE MEDICAL CARE IF:    You have difficulty breathing, are wheezing, or have a tight feeling in your chest or throat.   You have a swollen mouth, or you have hives, swelling, or itching all over your body.   You have had a severe reaction that has responded to your anaphylaxis kit or an EpiPen. These reactions may return when the medicine has worn off. These reactions should be considered life threatening.  MAKE SURE YOU:    Understand these instructions.   Will watch your condition.   Will get help right away if you are not doing well or get worse.  Document Released: 12/22/2002 Document Revised: 12/21/2011 Document Reviewed: 05/28/2008  [employer] Patient Information 2013 [employer].

## 2012-12-01 NOTE — Assessment & Plan Note (Signed)
Well controlled Refilled prilosec today

## 2013-01-09 ENCOUNTER — Ambulatory Visit (INDEPENDENT_AMBULATORY_CARE_PROVIDER_SITE_OTHER): Payer: BC Managed Care – PPO

## 2013-01-09 ENCOUNTER — Encounter: Payer: Self-pay | Admitting: Internal Medicine

## 2013-01-09 ENCOUNTER — Ambulatory Visit (INDEPENDENT_AMBULATORY_CARE_PROVIDER_SITE_OTHER): Payer: BC Managed Care – PPO | Admitting: Internal Medicine

## 2013-01-09 ENCOUNTER — Other Ambulatory Visit: Payer: Self-pay | Admitting: Internal Medicine

## 2013-01-09 VITALS — BP 122/74 | HR 101 | Ht 78.0 in | Wt 393.6 lb

## 2013-01-09 DIAGNOSIS — J309 Allergic rhinitis, unspecified: Secondary | ICD-10-CM

## 2013-01-09 DIAGNOSIS — L500 Allergic urticaria: Secondary | ICD-10-CM | POA: Insufficient documentation

## 2013-01-09 DIAGNOSIS — Z9109 Other allergy status, other than to drugs and biological substances: Secondary | ICD-10-CM

## 2013-01-09 DIAGNOSIS — Z889 Allergy status to unspecified drugs, medicaments and biological substances status: Secondary | ICD-10-CM

## 2013-01-09 LAB — CBC WITH DIFFERENTIAL/PLATELET
Basophils Absolute: 0 10*3/uL (ref 0.0–0.1)
Eosinophils Absolute: 0.2 10*3/uL (ref 0.0–0.7)
HCT: 43.3 % (ref 39.0–52.0)
Hemoglobin: 14.6 g/dL (ref 13.0–17.0)
Lymphs Abs: 2.3 10*3/uL (ref 0.7–4.0)
MCHC: 33.7 g/dL (ref 30.0–36.0)
Monocytes Relative: 7.7 % (ref 3.0–12.0)
Neutro Abs: 6.9 10*3/uL (ref 1.4–7.7)
RDW: 13.7 % (ref 11.5–14.6)

## 2013-01-09 NOTE — Assessment & Plan Note (Signed)
Not sure if there is a unifying trigger for these discrete events, separated in time. Plan- Allergy and Food allergy profiles, CBC. Antihistamines as needed.

## 2013-01-09 NOTE — Patient Instructions (Addendum)
Order- lab- Allergy Profile, Food IgE profile, CBC

## 2013-01-09 NOTE — Progress Notes (Signed)
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 Facial flushing, lips numb, throat burned, SOB. Took benadryl and pepcid immediately w/o relief. Treated at ER with recovery. 2) Christmas, 2013- woke from sleep with swollen uvula/ tongue. 12 hours after last meal. ER RX'd benadryl, solumedrol w/ recovery. 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 He had childhood asthma. Definite seasonal rhinitis treated with antihistamines. Drug rash- augmentin and cherry flavored Septra. No problem with latex, contrast, aspirin, insect venom. Several cousins with food and inhalent allergens. Alcohol once/ month. No prior recognized food intolerance.  CT sinus 12/13/2009 IMPRESSION:  Findings compatible chronic sinusitis involving the sphenoid and  maxillary sinuses. No acute findings.  Provider: Josetta Huddle  Prior to Admission medications   Medication Sig Start Date End Date Taking? Authorizing Provider  ALPRAZolam Prudy Feeler) 1 MG tablet Take 1 tablet (1 mg total) by mouth as needed. For anxiety 12/01/12  Yes Nicki Reaper, NP  aspirin EC 81 MG tablet Take 81 mg by mouth daily.   Yes Historical Provider, MD  buPROPion (WELLBUTRIN) 75 MG tablet Take 1 tablet (75 mg total) by mouth daily. 12/01/12  Yes Nicki Reaper, NP  losartan (COZAAR) 100 MG tablet Take 1 tablet (100 mg total) by mouth daily. 12/01/12  Yes Nicki Reaper, NP  omeprazole (PRILOSEC) 40 MG capsule Take 40 mg by mouth daily. Can take up to BID as needed 12/01/12  Yes Nicki Reaper, NP  verapamil (CALAN-SR) 120 MG CR tablet Take 1 tablet (120 mg total) by mouth daily. 12/01/12  Yes Nicki Reaper, NP  celecoxib (CELEBREX) 200 MG capsule Take 1 capsule (200 mg total) by mouth daily. 12/01/12   Nicki Reaper, NP   Past Medical History   Diagnosis Date  . ALLERGIC RHINITIS 06/10/2009  . ADD 09/13/2007  . ASTHMA, UNSPECIFIED, UNSPECIFIED STATUS 07/19/2008  . DEPRESSIVE DISORDER 09/13/2007  . DYSMETABOLIC SYNDROME 10/25/2009  . GERD 09/13/2007  . HYPERLIPIDEMIA 10/10/2009  . HYPERTENSION 09/13/2007  . IBS 03/20/2009  . Morbid obesity 07/19/2008  . Skin cancer    Past Surgical History  Procedure Laterality Date  . Appendectomy  2004  . Ankle surgery  2007  . Skin cancer excision      teenager   Family History  Problem Relation Age of Onset  . Hypertension Father   . Emphysema Maternal Grandmother     smoker  . Allergies      whole family-both sides of family  . Asthma Mother   . Asthma Paternal Grandmother   . Asthma Maternal Grandmother   . Heart disease      MGM deceased with MI; both sides of grandparents  . Clotting disorder Mother     Factor 5  . Clotting disorder Maternal Uncle     Factor 5  . Rheum arthritis Mother   . Skin cancer Mother   . Lung cancer Maternal Grandmother   . Diabetes Father     type 2   . Diabetes Paternal Grandmother     type 2   History   Social History  . Marital Status: Single    Spouse Name: N/A    Number of Children: N/A  . Years of Education: N/A   Occupational History  . Not on file.   Social History Main Topics  . Smoking status:  Never Smoker   . Smokeless tobacco: Never Used  . Alcohol Use: No     Comment: socially  . Drug Use: No  . Sexually Active: Not Currently   Other Topics Concern  . Not on file   Social History Narrative  . No narrative on file   ROS-see HPI: Current status- Constitutional:   No-   weight loss, night sweats, fevers, chills, fatigue, lassitude. HEENT:   No-  headaches, difficulty swallowing, tooth/dental problems, sore throat,       No-  sneezing, itching, ear ache, +nasal congestion, post nasal drip,  CV:  No-   chest pain, orthopnea, PND, swelling in lower extremities, anasarca,                                  dizziness,  palpitations Resp: No-   shortness of breath with exertion or at rest.              N+   productive cough,  No non-productive cough,  No- coughing up of blood.              No-   change in color of mucus.  No- wheezing.  No- snore Skin: No-   rash or lesions. GI:  + heartburn, indigestion, No-abdominal pain, nausea, vomiting,  GU: No-   dysuria, change in color of urine, no urgency or frequency.  No- flank pain.  MS:  No-   joint pain or swelling.  No- decreased range of motion.  No- back pain. Neuro-     nothing unusual Psych:  No- change in mood or affect. No depression or anxiety.  No memory loss.  OBJ- Physical Exam General- Alert, Oriented, Affect-appropriate, Distress- none acute. Tall, husky.  Skin- rash-none, lesions- none, excoriation- none Lymphadenopathy- none Head- atraumatic            Eyes- Gross vision intact, PERRLA, conjunctivae and secretions clear            Ears- Hearing, canals-normal            Nose- +turbinate edema, no-Septal dev, mucus, polyps, erosion, perforation             Throat- Mallampati III , mucosa clear , drainage- none, tonsils present Neck- flexible , trachea midline, no stridor , thyroid nl, carotid no bruit Chest - symmetrical excursion , unlabored           Heart/CV- RRR , no murmur , no gallop  , no rub, nl s1 s2                           - JVD- none , edema- none, stasis changes- none, varices- none           Lung- clear to P&A, wheeze- none, cough- none , dullness-none, rub- none           Chest wall-  Abd- tender-no, distended-no, bowel sounds-present, HSM- no Br/ Gen/ Rectal- Not done, not indicated Extrem- cyanosis- none, clubbing, none, atrophy- none, strength- nl Neuro- grossly intact to observation

## 2013-01-10 LAB — ALLERGY FULL PROFILE
Allergen, D pternoyssinus,d7: <0.1 kU/L
Bahia Grass: <0.1 kU/L
Box Elder IgE: <0.1 kU/L
Common Ragweed: <0.1 kU/L
Dog Dander: <0.1 kU/L
Elm IgE: <0.1 kU/L
G005 Rye, Perennial: <0.1 kU/L
House Dust Hollister: <0.1 kU/L
Oak: <0.1 kU/L
Sycamore Tree: <0.1 kU/L
Timothy Grass: <0.1 kU/L

## 2013-01-10 LAB — ALLERGEN, SHRIMP F24: Shrimp IgE: <0.1 kU/L

## 2013-01-10 LAB — ALLERGEN, APPLE F49: Apple: <0.1 kU/L

## 2013-01-10 LAB — ALLERGEN, PEANUT F13: Peanut IgE: <0.1 kU/L

## 2013-01-10 LAB — ALLERGEN, TUNA F40: Tuna IgE: <0.1 kU/L

## 2013-01-10 LAB — ALLERGEN, FISH, COD F3: Fish Cod: <0.1 kU/L

## 2013-01-13 ENCOUNTER — Other Ambulatory Visit: Payer: Self-pay | Admitting: Internal Medicine

## 2013-01-13 ENCOUNTER — Telehealth: Payer: Self-pay | Admitting: Internal Medicine

## 2013-01-13 DIAGNOSIS — F32A Depression, unspecified: Secondary | ICD-10-CM

## 2013-01-13 DIAGNOSIS — F329 Major depressive disorder, single episode, unspecified: Secondary | ICD-10-CM

## 2013-01-13 MED ORDER — BUPROPION HCL 75 MG PO TABS: 75.0000 mg | ORAL_TABLET | Freq: Every day | ORAL | Status: DC

## 2013-01-13 MED ORDER — ALPRAZOLAM 1 MG PO TABS: 1.0000 mg | ORAL_TABLET | Freq: Three times a day (TID) | ORAL | Status: DC | PRN

## 2013-01-13 NOTE — Telephone Encounter (Signed)
CBC blood count is ok. The allergy test profiles are not back yet.

## 2013-01-13 NOTE — Telephone Encounter (Signed)
I spoke with pt and he is requesting his lab results. He is mainly wanting his allergy results. Does not look like they all are in EPIC yet. Please advise Dr. Maple Hudson thanks Last ov 01/09/13 Pending 02/23/13

## 2013-01-13 NOTE — Telephone Encounter (Signed)
I spoke with patient about results and he verbalized understanding and had no questions 

## 2013-01-19 ENCOUNTER — Telehealth: Payer: Self-pay | Admitting: Internal Medicine

## 2013-01-19 NOTE — Telephone Encounter (Signed)
Alexis [name]---pt is requesting the results of his allergy test.  Please advise. i didn't see this in his chart. thanks

## 2013-01-20 ENCOUNTER — Telehealth: Payer: Self-pay | Admitting: Internal Medicine

## 2013-01-20 NOTE — Telephone Encounter (Signed)
Made in error. Alexis [name]  °

## 2013-01-20 NOTE — Telephone Encounter (Signed)
I have called and spoken with rep as [facility]-they are faxing results to our office for CY to review and advise. Results and message on CY's cart.

## 2013-01-20 NOTE — Telephone Encounter (Signed)
Pt's mom called back regarding results.  I advised her we are waiting for results to be faxed to our office and for Dr Maple Hudson to review. Leanora Ivanoff

## 2013-01-21 ENCOUNTER — Encounter: Payer: Self-pay | Admitting: Internal Medicine

## 2013-01-23 NOTE — Telephone Encounter (Signed)
Per CY-IgE antibody levels are not high. There is minor elevation only for dust mite; none of the foods---just avoid what causes symptoms.    Spoke with patient and he is aware. Results placed in scan folder.   Nothing more needed at this time. Will sign off on message.

## 2013-02-16 ENCOUNTER — Encounter: Payer: Self-pay | Admitting: Internal Medicine

## 2013-02-23 ENCOUNTER — Encounter: Payer: Self-pay | Admitting: Internal Medicine

## 2013-02-23 ENCOUNTER — Ambulatory Visit (INDEPENDENT_AMBULATORY_CARE_PROVIDER_SITE_OTHER): Payer: BC Managed Care – PPO | Admitting: Internal Medicine

## 2013-02-23 VITALS — BP 124/76 | HR 86 | Ht 78.0 in | Wt 395.8 lb

## 2013-02-23 DIAGNOSIS — J209 Acute bronchitis, unspecified: Secondary | ICD-10-CM

## 2013-02-23 DIAGNOSIS — L500 Allergic urticaria: Secondary | ICD-10-CM

## 2013-02-23 DIAGNOSIS — J310 Chronic rhinitis: Secondary | ICD-10-CM

## 2013-02-23 MED ORDER — AZITHROMYCIN 250 MG PO TABS: ORAL_TABLET | ORAL | Status: DC

## 2013-02-23 MED ORDER — EPINEPHRINE 0.3 MG/0.3ML IJ SOAJ: 0.3000 mg | Freq: Once | INTRAMUSCULAR | Status: DC

## 2013-02-23 NOTE — Patient Instructions (Addendum)
Script Epipen for use in case of severe allergic reaction  Scrip Zpak antibiotic for bronchitis  Ok to take a daily protective antihistamine like Claritin/ loratadine, Allegra/ fexofenadine, or Zyrtec/ cetirizine, if needed

## 2013-02-23 NOTE — Progress Notes (Signed)
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 Facial flushing, lips numb, throat burned, SOB. Took benadryl and pepcid immediately w/o relief. Treated at ER with recovery. 2) Christmas, 2013- woke from sleep with swollen uvula/ tongue. 12 hours after last meal. ER RX'd benadryl, solumedrol w/ recovery. 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 He had childhood asthma. Definite seasonal rhinitis treated with antihistamines. Drug rash- augmentin and cherry flavored Septra. No problem with latex, contrast, aspirin, insect venom. Several cousins with food and inhalent allergens. Alcohol once/ month. No prior recognized food intolerance.  CT sinus 12/13/2009 IMPRESSION:  Findings compatible chronic sinusitis involving the sphenoid and  maxillary sinuses. No acute findings.  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 Review labwork in greater detail  with patient. He describes 3 total episodes of urticaria/red rash/bumps. One episode after handling laundry at work-faded after 2 hours with Benadryl. One episode on a beach trip since here last, and 1 very distant episode not well remembered. Allergy Profile 01/09/2013-total IgE 37.6 with no specific allergens negative except for dust mite. CBC unremarkable. Food IgE profile negative for all agents tested. Recent bronchitis green sputum, seems to be clearing.  ROS-see HPI: Current status- Constitutional:   No-   weight loss, night sweats, fevers, chills, fatigue, lassitude. HEENT:   No-  headaches, difficulty swallowing,  tooth/dental problems, sore throat,       No-  sneezing, itching, ear ache, +nasal congestion, post nasal drip,  CV:  No-   chest pain, orthopnea, PND, swelling in lower extremities, anasarca,                                  dizziness, palpitations Resp: No-   shortness of breath with exertion or at rest.              +  productive cough,  No non-productive cough,  No- coughing up of blood.              No-   change in color of mucus.  No- wheezing.  No- snore Skin: Per HPI GI:  + heartburn, indigestion, No-abdominal pain, nausea, vomiting,   GU:   MS:  No-   joint pain or swelling.   Neuro-     nothing unusual Psych:  No- change in mood or affect. No depression or anxiety.  No memory loss.  OBJ- Physical Exam General- Alert, Oriented, Affect-appropriate, Distress- none acute. Tall, overweight.  Skin- rash-none, lesions- none, excoriation- none. Clear at this time. Lymphadenopathy- none Head- atraumatic            Eyes- Gross vision intact, PERRLA, conjunctivae and secretions clear            Ears- Hearing, canals-normal            Nose- +turbinate edema, no-Septal dev, mucus, polyps, erosion, perforation             Throat- Mallampati III , mucosa clear , drainage- none, tonsils present Neck- flexible , trachea midline, no stridor , thyroid nl, carotid no bruit  Chest - symmetrical excursion , unlabored           Heart/CV- RRR , no murmur , no gallop  , no rub, nl s1 s2                           - JVD- none , edema- none, stasis changes- none, varices- none           Lung- clear to P&A, wheeze- none, cough- none , dullness-none, rub- none           Chest wall-  Abd-  Br/ Gen/ Rectal- Not done, not indicated Extrem- cyanosis- none, clubbing, none, atrophy- none, strength- nl Neuro- grossly intact to observation

## 2013-03-05 ENCOUNTER — Encounter: Payer: Self-pay | Admitting: Internal Medicine

## 2013-03-05 DIAGNOSIS — J209 Acute bronchitis, unspecified: Secondary | ICD-10-CM | POA: Insufficient documentation

## 2013-03-05 NOTE — Assessment & Plan Note (Signed)
This seems to be clearing spontaneously. Plan-Z-Pak to hold with discussion

## 2013-03-05 NOTE — Assessment & Plan Note (Signed)
Plan-Treat non-specifically with antihistamines and nasal steroids as needed

## 2013-03-05 NOTE — Assessment & Plan Note (Signed)
Unremarkable allergy profiles do not suggest an atopic basis urticaria, angioedema or rhinitis. He could be very sensitive to 1 less common trigger. Heat, pressure, or some other mechanism may be important Plan-EpiPen, antihistamine use discussed. Attention to triggers and circumstances of any future events.

## 2013-03-08 ENCOUNTER — Other Ambulatory Visit: Payer: Self-pay | Admitting: *Deleted

## 2013-03-08 DIAGNOSIS — I10 Essential (primary) hypertension: Secondary | ICD-10-CM

## 2013-03-08 DIAGNOSIS — G43109 Migraine with aura, not intractable, without status migrainosus: Secondary | ICD-10-CM

## 2013-03-08 MED ORDER — VERAPAMIL HCL ER 120 MG PO TBCR: 120.0000 mg | EXTENDED_RELEASE_TABLET | Freq: Every day | ORAL | Status: DC

## 2013-03-08 MED ORDER — LOSARTAN POTASSIUM 100 MG PO TABS: 100.0000 mg | ORAL_TABLET | Freq: Every day | ORAL | Status: DC

## 2013-04-07 NOTE — Progress Notes (Signed)
Quick Note:  Pt seen 02-23-13 and results reviewed in detail with patient by CY. Will sign off on results. ______

## 2013-05-08 ENCOUNTER — Ambulatory Visit (INDEPENDENT_AMBULATORY_CARE_PROVIDER_SITE_OTHER)
Admission: RE | Admit: 2013-05-08 | Discharge: 2013-05-08 | Disposition: A | Discharge status: Home / Self Care | Payer: BC Managed Care – PPO | Source: Ambulatory Visit | Attending: Internal Medicine | Admitting: Internal Medicine

## 2013-05-08 ENCOUNTER — Encounter: Payer: Self-pay | Admitting: Internal Medicine

## 2013-05-08 ENCOUNTER — Ambulatory Visit (INDEPENDENT_AMBULATORY_CARE_PROVIDER_SITE_OTHER): Payer: BC Managed Care – PPO | Admitting: Internal Medicine

## 2013-05-08 ENCOUNTER — Other Ambulatory Visit (INDEPENDENT_AMBULATORY_CARE_PROVIDER_SITE_OTHER): Payer: BC Managed Care – PPO

## 2013-05-08 VITALS — BP 120/68 | HR 95 | Temp 98.1°F | Resp 16 | Wt 393.0 lb

## 2013-05-08 DIAGNOSIS — R079 Chest pain, unspecified: Secondary | ICD-10-CM

## 2013-05-08 DIAGNOSIS — I10 Essential (primary) hypertension: Secondary | ICD-10-CM

## 2013-05-08 LAB — COMPREHENSIVE METABOLIC PANEL
ALT: 38 U/L (ref 0–53)
AST: 26 U/L (ref 0–37)
Alkaline Phosphatase: 60 U/L (ref 39–117)
BUN: 11 mg/dL (ref 6–23)
Creatinine, Ser: 1 mg/dL (ref 0.4–1.5)
Potassium: 3.7 mEq/L (ref 3.5–5.1)

## 2013-05-08 LAB — TSH: TSH: 1.99 u[IU]/mL (ref 0.35–5.50)

## 2013-05-08 NOTE — Progress Notes (Signed)
Subjective:    Patient ID: Alexis [name], male    DOB: September 23, 1986, 27 y.o.   MRN: [id number]  Chest Pain  This is a new problem. The current episode started in the past 7 days. The onset quality is gradual. The problem occurs intermittently. The problem has been gradually improving. The pain is present in the lateral region (right anterior area). The pain is at a severity of 2/10. The pain is mild. The quality of the pain is described as stabbing and sharp. The pain does not radiate. Pertinent negatives include no abdominal pain, back pain, claudication, cough, diaphoresis, dizziness, exertional chest pressure, fever, headaches, hemoptysis, irregular heartbeat, leg pain, lower extremity edema, malaise/fatigue, nausea, near-syncope, numbness, orthopnea, palpitations, PND, shortness of breath, sputum production, syncope, vomiting or weakness. The pain is aggravated by movement and deep breathing. He has tried NSAIDs for the symptoms. The treatment provided significant relief.      Review of Systems  Constitutional: Negative.  Negative for fever, chills, malaise/fatigue, diaphoresis, activity change, fatigue and unexpected weight change.  HENT: Negative.   Eyes: Negative.   Respiratory: Negative.  Negative for apnea, cough, hemoptysis, sputum production, choking, chest tightness, shortness of breath, wheezing and stridor.   Cardiovascular: Positive for chest pain. Negative for palpitations, orthopnea, claudication, leg swelling, syncope, PND and near-syncope.  Gastrointestinal: Negative.  Negative for nausea, vomiting, abdominal pain, diarrhea and constipation.  Endocrine: Negative.   Genitourinary: Negative.   Musculoskeletal: Negative.  Negative for back pain.  Skin: Negative.   Allergic/Immunologic: Negative.   Neurological: Negative.  Negative for dizziness, tremors, syncope, speech difficulty, weakness, light-headedness, numbness and headaches.  Hematological: Negative.  Negative for  adenopathy. Does not bruise/bleed easily.  Psychiatric/Behavioral: Negative.        Objective:   Physical Exam  Vitals reviewed. Constitutional: He is oriented to person, place, and time. He appears well-developed and well-nourished.  Non-toxic appearance. He does not have a sickly appearance. He does not appear ill. No distress.  HENT:  Head: Normocephalic and atraumatic.  Mouth/Throat: Oropharynx is clear and moist. No oropharyngeal exudate.  Eyes: Conjunctivae are normal. Right eye exhibits no discharge. Left eye exhibits no discharge. No scleral icterus.  Neck: Normal range of motion. Neck supple. No JVD present. No tracheal deviation present. No thyromegaly present.  Cardiovascular: Normal rate, regular rhythm, normal heart sounds and intact distal pulses.  Exam reveals no gallop and no friction rub.   No murmur heard. Pulmonary/Chest: Effort normal and breath sounds normal. No accessory muscle usage or stridor. Not tachypneic. No respiratory distress. He has no decreased breath sounds. He has no wheezes. He has no rhonchi. He has no rales. Chest wall is not dull to percussion. He exhibits no mass, no tenderness, no bony tenderness, no laceration, no crepitus, no edema, no deformity, no swelling and no retraction.  Abdominal: Soft. Bowel sounds are normal. He exhibits no distension and no mass. There is no tenderness. There is no rebound and no guarding.  Musculoskeletal: Normal range of motion. He exhibits no edema and no tenderness.  Lymphadenopathy:    He has no cervical adenopathy.  Neurological: He is oriented to person, place, and time.  Skin: Skin is warm and dry. No rash noted. He is not diaphoretic. No erythema. No pallor.  Psychiatric: He has a normal mood and affect. His behavior is normal. Judgment and thought content normal.     Lab Results  Component Value Date   WBC 10.2 01/09/2013   HGB 14.6 01/09/2013  HCT 43.3 01/09/2013   PLT 239.0 01/09/2013   GLUCOSE 88  04/25/2012   CHOL 188 04/25/2012   TRIG 97.0 04/25/2012   HDL 42.90 04/25/2012   LDLCALC 126* 04/25/2012   ALT 48 04/25/2012   AST 34 04/25/2012   NA 139 04/25/2012   K 4.1 04/25/2012   CL 102 04/25/2012   CREATININE 0.8 04/25/2012   BUN 9 04/25/2012   CO2 28 04/25/2012   TSH 1.78 04/25/2012   HGBA1C 5.5 03/08/2007       Assessment & Plan:

## 2013-05-08 NOTE — Patient Instructions (Signed)
Chest Pain (Nonspecific) °It is often hard to give a specific diagnosis for the cause of chest pain. There is always a chance that your pain could be related to something serious, such as a heart attack or a blood clot in the lungs. You need to follow up with your caregiver for further evaluation. °CAUSES  °· Heartburn. °· Pneumonia or bronchitis. °· Anxiety or stress. °· Inflammation around your heart (pericarditis) or lung (pleuritis or pleurisy). °· A blood clot in the lung. °· A collapsed lung (pneumothorax). It can develop suddenly on its own (spontaneous pneumothorax) or from injury (trauma) to the chest. °· Shingles infection (herpes zoster virus). °The chest wall is composed of bones, muscles, and cartilage. Any of these can be the source of the pain. °· The bones can be bruised by injury. °· The muscles or cartilage can be strained by coughing or overwork. °· The cartilage can be affected by inflammation and become sore (costochondritis). °DIAGNOSIS  °Lab tests or other studies, such as X-rays, electrocardiography, stress testing, or cardiac imaging, may be needed to find the cause of your pain.  °TREATMENT  °· Treatment depends on what may be causing your chest pain. Treatment may include: °· Acid blockers for heartburn. °· Anti-inflammatory medicine. °· Pain medicine for inflammatory conditions. °· Antibiotics if an infection is present. °· You may be advised to change lifestyle habits. This includes stopping smoking and avoiding alcohol, caffeine, and chocolate. °· You may be advised to keep your head raised (elevated) when sleeping. This reduces the chance of acid going backward from your stomach into your esophagus. °· Most of the time, nonspecific chest pain will improve within 2 to 3 days with rest and mild pain medicine. °HOME CARE INSTRUCTIONS  °· If antibiotics were prescribed, take your antibiotics as directed. Finish them even if you start to feel better. °· For the next few days, avoid physical  activities that bring on chest pain. Continue physical activities as directed. °· Do not smoke. °· Avoid drinking alcohol. °· Only take over-the-counter or prescription medicine for pain, discomfort, or fever as directed by your caregiver. °· Follow your caregiver's suggestions for further testing if your chest pain does not go away. °· Keep any follow-up appointments you made. If you do not go to an appointment, you could develop lasting (chronic) problems with pain. If there is any problem keeping an appointment, you must call to reschedule. °SEEK MEDICAL CARE IF:  °· You think you are having problems from the medicine you are taking. Read your medicine instructions carefully. °· Your chest pain does not go away, even after treatment. °· You develop a rash with blisters on your chest. °SEEK IMMEDIATE MEDICAL CARE IF:  °· You have increased chest pain or pain that spreads to your arm, neck, jaw, back, or abdomen. °· You develop shortness of breath, an increasing cough, or you are coughing up blood. °· You have severe back or abdominal pain, feel nauseous, or vomit. °· You develop severe weakness, fainting, or chills. °· You have a fever. °THIS IS AN EMERGENCY. Do not wait to see if the pain will go away. Get medical help at once. Call your local emergency services (911 in [place] Do not drive yourself to the hospital. °MAKE SURE YOU:  °· Understand these instructions. °· Will watch your condition. °· Will get help right away if you are not doing well or get worse. °Document Released: 07/08/2005 Document Revised: 12/21/2011 Document Reviewed: 05/03/2008 °Exit[employer]® Patient Information ©2014 [employer]. ° °

## 2013-05-09 ENCOUNTER — Encounter: Payer: Self-pay | Admitting: Internal Medicine

## 2013-05-09 NOTE — Assessment & Plan Note (Signed)
Chest xray is normal Labs are normal He is getting better with celebrex I think this is either MS chest wall pain or pleurisy Will observe for now

## 2013-05-09 NOTE — Assessment & Plan Note (Signed)
His BP is well controlled 

## 2013-05-11 ENCOUNTER — Encounter: Payer: Self-pay | Admitting: Internal Medicine

## 2013-06-03 ENCOUNTER — Other Ambulatory Visit: Payer: Self-pay | Admitting: Internal Medicine

## 2013-06-13 ENCOUNTER — Encounter: Payer: Self-pay | Admitting: Internal Medicine

## 2013-06-16 ENCOUNTER — Ambulatory Visit (INDEPENDENT_AMBULATORY_CARE_PROVIDER_SITE_OTHER): Payer: BC Managed Care – PPO | Admitting: Internal Medicine

## 2013-06-16 ENCOUNTER — Encounter: Payer: Self-pay | Admitting: Internal Medicine

## 2013-06-16 ENCOUNTER — Ambulatory Visit: Payer: Self-pay | Admitting: Internal Medicine

## 2013-06-16 VITALS — BP 124/80 | HR 93 | Temp 98.6°F | Wt 397.5 lb

## 2013-06-16 DIAGNOSIS — G43809 Other migraine, not intractable, without status migrainosus: Secondary | ICD-10-CM

## 2013-06-16 DIAGNOSIS — J310 Chronic rhinitis: Secondary | ICD-10-CM

## 2013-06-16 DIAGNOSIS — G43109 Migraine with aura, not intractable, without status migrainosus: Secondary | ICD-10-CM

## 2013-06-16 DIAGNOSIS — F32A Depression, unspecified: Secondary | ICD-10-CM

## 2013-06-16 DIAGNOSIS — K219 Gastro-esophageal reflux disease without esophagitis: Secondary | ICD-10-CM

## 2013-06-16 DIAGNOSIS — I10 Essential (primary) hypertension: Secondary | ICD-10-CM

## 2013-06-16 DIAGNOSIS — F329 Major depressive disorder, single episode, unspecified: Secondary | ICD-10-CM

## 2013-06-16 MED ORDER — BUPROPION HCL 75 MG PO TABS: 75.0000 mg | ORAL_TABLET | Freq: Every day | ORAL | Status: DC

## 2013-06-16 MED ORDER — VERAPAMIL HCL ER 120 MG PO TBCR: 120.0000 mg | EXTENDED_RELEASE_TABLET | Freq: Every day | ORAL | Status: DC

## 2013-06-16 MED ORDER — METHYLPREDNISOLONE ACETATE 80 MG/ML IJ SUSP
80.0000 mg | Freq: Once | INTRAMUSCULAR | Status: AC
  Administered 2013-06-16: 80 mg via INTRAMUSCULAR

## 2013-06-16 MED ORDER — LOSARTAN POTASSIUM 100 MG PO TABS: 100.0000 mg | ORAL_TABLET | Freq: Every day | ORAL | Status: DC

## 2013-06-16 MED ORDER — OMEPRAZOLE 40 MG PO CPDR: DELAYED_RELEASE_CAPSULE | ORAL | Status: DC

## 2013-06-16 NOTE — Addendum Note (Signed)
Addended by: Darnell Level on: 06/16/2013 11:52 AM   Modules accepted: Orders

## 2013-06-16 NOTE — Assessment & Plan Note (Signed)
+   5 lb since last visit Weight loss emphasized with diet and exercise

## 2013-06-16 NOTE — Assessment & Plan Note (Signed)
Well-controlled on current regimen. No changes today. 

## 2013-06-16 NOTE — Progress Notes (Signed)
Subjective:    Patient ID: Alexis [name], male    DOB: Oct 06, 1986, 27 y.o.   MRN: [id number]  HPI  Pt presents to the clinic today with c/o elevated blood pressure. His BP today is 124/80. He is on losartan and verapamil.He reports that at times his blood pressure is 160/100.  This has been intermittent over the last 2 weeks. He is checking this with his mothers blood pressure cuff. He is not sure how old it is. He also reports he has not been feeling well over the last month. He has bad allergies which are not well controlled. He has been fatigued, headaches, nasal congestion, post nasal drip. He has been taking mucinex and zyrtec for the last week with some improvement in symptoms. Additionally, he does need refills on all his medications. His last visit with Dr. Yetta Barre was 05/08/13.  Review of Systems      Past Medical History  Diagnosis Date  . ALLERGIC RHINITIS 06/10/2009  . ADD 09/13/2007  . ASTHMA, UNSPECIFIED, UNSPECIFIED STATUS 07/19/2008  . DEPRESSIVE DISORDER 09/13/2007  . DYSMETABOLIC SYNDROME 10/25/2009  . GERD 09/13/2007  . HYPERLIPIDEMIA 10/10/2009  . HYPERTENSION 09/13/2007  . IBS 03/20/2009  . Morbid obesity 07/19/2008  . Skin cancer     Current Outpatient Prescriptions  Medication Sig Dispense Refill  . ALPRAZolam (XANAX) 1 MG tablet Take 1 tablet (1 mg total) by mouth 3 (three) times daily as needed. For anxiety  65 tablet  2  . aspirin EC 81 MG tablet Take 81 mg by mouth daily.      Marland Kitchen buPROPion (WELLBUTRIN) 75 MG tablet Take 1 tablet (75 mg total) by mouth daily.  90 tablet  3  . celecoxib (CELEBREX) 200 MG capsule Take 1 capsule (200 mg total) by mouth daily.  30 capsule  0  . EPINEPHrine (EPI-PEN) 0.3 mg/0.3 mL DEVI Inject 0.3 mLs (0.3 mg total) into the muscle once.  1 Device  prn  . losartan (COZAAR) 100 MG tablet Take 1 tablet (100 mg total) by mouth daily.  30 tablet  3  . omeprazole (PRILOSEC) 40 MG capsule TAKE 1 CAPSULE BY MOUTH TWICE A DAY  180 capsule  3  .  verapamil (CALAN-SR) 120 MG CR tablet Take 1 tablet (120 mg total) by mouth daily.  30 tablet  3   No current facility-administered medications for this visit.    Allergies  Allergen Reactions  . Citrus Anaphylaxis and Other (See Comments)    Lemon  . Amoxicillin-Pot Clavulanate   . Sulfamethoxazole W-Trimethoprim     Family History  Problem Relation Age of Onset  . Hypertension Father   . Emphysema Maternal Grandmother     smoker  . Allergies      whole family-both sides of family  . Asthma Mother   . Asthma Paternal Grandmother   . Asthma Maternal Grandmother   . Heart disease      MGM deceased with MI; both sides of grandparents  . Clotting disorder Mother     Factor 5  . Clotting disorder Maternal Uncle     Factor 5  . Rheum arthritis Mother   . Skin cancer Mother   . Lung cancer Maternal Grandmother   . Diabetes Father     type 2   . Diabetes Paternal Grandmother     type 2    History   Social History  . Marital Status: Single    Spouse Name: N/A    Number of Children:  N/A  . Years of Education: N/A   Occupational History  . Not on file.   Social History Main Topics  . Smoking status: Never Smoker   . Smokeless tobacco: Never Used  . Alcohol Use: No     Comment: socially  . Drug Use: No  . Sexual Activity: Not Currently   Other Topics Concern  . Not on file   Social History Narrative  . No narrative on file     Constitutional: Pt reports fatigue and headache. Denies fever, malaise, or abrupt weight changes.  HEENT: Pt reports nasal congestion. Denies eye pain, eye redness, ear pain, ringing in the ears, wax buildup, runny nose, bloody nose, or sore throat. Respiratory: Denies difficulty breathing, shortness of breath, cough or sputum production.   Neurological: Denies dizziness, difficulty with memory, difficulty with speech or problems with balance and coordination.   No other specific complaints in a complete review of systems (except as  listed in HPI above).   Objective:   Physical Exam   BP 124/80  Pulse 93  Temp(Src) 98.6 F (37 C) (Oral)  Wt 397 lb 8 oz (180.305 kg)  BMI 45.95 kg/m2  SpO2 98% Wt Readings from Last 3 Encounters:  06/16/13 397 lb 8 oz (180.305 kg)  05/08/13 393 lb (178.264 kg)  02/23/13 395 lb 12.8 oz (179.534 kg)    General: Appears his stated age, obese but well developed, well nourished in NAD. HEENT: Head: normal shape and size; Eyes: sclera white, no icterus, conjunctiva pink, PERRLA and EOMs intact; Ears: Tm's gray and intact, normal light reflex; Nose: mucosa  boggy and moist, septum midline; Throat/Mouth: Teeth present, mucosa erythematous and moist,+ PND, no exudate, lesions or ulcerations noted.  Neck: Normal range of motion. Neck supple, trachea midline. No massses, lumps or thyromegaly present.  Cardiovascular: Normal rate and rhythm. S1,S2 noted.  No murmur, rubs or gallops noted. No JVD or BLE edema. No carotid bruits noted. Pulmonary/Chest: Normal effort and positive vesicular breath sounds. No respiratory distress. No wheezes, rales or ronchi noted.  Neurological: Alert and oriented. Cranial nerves II-XII intact. Coordination normal. +DTRs bilaterally.  BMET    Component Value Date/Time   NA 137 05/08/2013 1538   K 3.7 05/08/2013 1538   CL 104 05/08/2013 1538   CO2 27 05/08/2013 1538   GLUCOSE 109* 05/08/2013 1538   BUN 11 05/08/2013 1538   CREATININE 1.0 05/08/2013 1538   CALCIUM 9.1 05/08/2013 1538   GFRNONAA 115.95 06/13/2010 1556   GFRAA  Value: >60        The eGFR has been calculated using the MDRD equation. This calculation has not been validated in all clinical situations. eGFR's persistently <60 mL/min signify possible Chronic Kidney Disease. 12/02/2009 2207    Lipid Panel     Component Value Date/Time   CHOL 188 04/25/2012 1455   TRIG 97.0 04/25/2012 1455   HDL 42.90 04/25/2012 1455   CHOLHDL 4 04/25/2012 1455   VLDL 19.4 04/25/2012 1455   LDLCALC 126* 04/25/2012 1455     CBC    Component Value Date/Time   WBC 10.2 01/09/2013 1102   RBC 5.24 01/09/2013 1102   HGB 14.6 01/09/2013 1102   HCT 43.3 01/09/2013 1102   PLT 239.0 01/09/2013 1102   MCV 82.6 01/09/2013 1102   MCHC 33.7 01/09/2013 1102   RDW 13.7 01/09/2013 1102   LYMPHSABS 2.3 01/09/2013 1102   MONOABS 0.8 01/09/2013 1102   EOSABS 0.2 01/09/2013 1102   BASOSABS 0.0 01/09/2013  1102    Hgb A1C Lab Results  Component Value Date   HGBA1C 5.5 03/08/2007        Assessment & Plan:

## 2013-06-16 NOTE — Patient Instructions (Signed)
Allergic Rhinitis Allergic rhinitis is when the mucous membranes in the nose respond to allergens. Allergens are particles in the air that cause your body to have an allergic reaction. This causes you to release allergic antibodies. Through a chain of events, these eventually cause you to release histamine into the blood stream (hence the use of antihistamines). Although meant to be protective to the body, it is this release that causes your discomfort, such as frequent sneezing, congestion and an itchy runny nose.  CAUSES  The pollen allergens may come from grasses, trees, and weeds. This is seasonal allergic rhinitis, or "hay fever." Other allergens cause year-round allergic rhinitis (perennial allergic rhinitis) such as house dust mite allergen, pet dander and mold spores.  SYMPTOMS   Nasal stuffiness (congestion).  Runny, itchy nose with sneezing and tearing of the eyes.  There is often an itching of the mouth, eyes and ears. It cannot be cured, but it can be controlled with medications. DIAGNOSIS  If you are unable to determine the offending allergen, skin or blood testing may find it. TREATMENT   Avoid the allergen.  Medications and allergy shots (immunotherapy) can help.  Hay fever may often be treated with antihistamines in pill or nasal spray forms. Antihistamines block the effects of histamine. There are over-the-counter medicines that may help with nasal congestion and swelling around the eyes. Check with your caregiver before taking or giving this medicine. If the treatment above does not work, there are many new medications your caregiver can prescribe. Stronger medications may be used if initial measures are ineffective. Desensitizing injections can be used if medications and avoidance fails. Desensitization is when a patient is given ongoing shots until the body becomes less sensitive to the allergen. Make sure you follow up with your caregiver if problems continue. SEEK MEDICAL  CARE IF:   You develop fever (more than 100.5 F (38.1 C).  You develop a cough that does not stop easily (persistent).  You have shortness of breath.  You start wheezing.  Symptoms interfere with normal daily activities. Document Released: 06/23/2001 Document Revised: 12/21/2011 Document Reviewed: 01/02/2009 Exit[employer] Patient Information 2014 [employer].  

## 2013-06-16 NOTE — Assessment & Plan Note (Signed)
Continue mucinex and zyrtec for now 80 mg Depo IM today

## 2013-08-17 ENCOUNTER — Other Ambulatory Visit: Payer: Self-pay

## 2013-09-02 ENCOUNTER — Other Ambulatory Visit: Payer: Self-pay | Admitting: Internal Medicine

## 2013-10-18 ENCOUNTER — Ambulatory Visit: Payer: Self-pay | Admitting: Internal Medicine

## 2013-10-19 ENCOUNTER — Ambulatory Visit (INDEPENDENT_AMBULATORY_CARE_PROVIDER_SITE_OTHER): Payer: BC Managed Care – PPO | Admitting: Internal Medicine

## 2013-10-19 ENCOUNTER — Encounter: Payer: Self-pay | Admitting: Internal Medicine

## 2013-10-19 VITALS — BP 120/86 | HR 78 | Temp 98.2°F | Resp 16 | Ht 78.0 in | Wt >= 6400 oz

## 2013-10-19 DIAGNOSIS — R059 Cough, unspecified: Secondary | ICD-10-CM

## 2013-10-19 DIAGNOSIS — J45909 Unspecified asthma, uncomplicated: Secondary | ICD-10-CM

## 2013-10-19 DIAGNOSIS — R05 Cough: Secondary | ICD-10-CM

## 2013-10-19 DIAGNOSIS — J209 Acute bronchitis, unspecified: Secondary | ICD-10-CM

## 2013-10-19 DIAGNOSIS — J4530 Mild persistent asthma, uncomplicated: Secondary | ICD-10-CM

## 2013-10-19 MED ORDER — AZITHROMYCIN 500 MG PO TABS: 500.0000 mg | ORAL_TABLET | Freq: Every day | ORAL | Status: DC

## 2013-10-19 MED ORDER — HYDROCODONE-HOMATROPINE 5-1.5 MG/5ML PO SYRP: 5.0000 mL | ORAL_SOLUTION | Freq: Three times a day (TID) | ORAL | Status: DC | PRN

## 2013-10-19 MED ORDER — MOMETASONE FURO-FORMOTEROL FUM 200-5 MCG/ACT IN AERO: 2.0000 | INHALATION_SPRAY | Freq: Two times a day (BID) | RESPIRATORY_TRACT | Status: DC

## 2013-10-19 NOTE — Assessment & Plan Note (Signed)
I have asked him to start using dulera bid

## 2013-10-19 NOTE — Assessment & Plan Note (Signed)
I am not suspicious that he has PNA so I did not order a CXR I am concerned that his chronic cough is c/w asthma so I have asked him to start using dulera inh

## 2013-10-19 NOTE — Patient Instructions (Signed)
Acute Bronchitis Bronchitis is inflammation of the airways that extend from the windpipe into the lungs (bronchi). The inflammation often causes mucus to develop. This leads to a cough, which is the most common symptom of bronchitis.  In acute bronchitis, the condition usually develops suddenly and goes away over time, usually in a couple weeks. Smoking, allergies, and asthma can make bronchitis worse. Repeated episodes of bronchitis may cause further lung problems.  CAUSES Acute bronchitis is most often caused by the same virus that causes a cold. The virus can spread from person to person (contagious).  SIGNS AND SYMPTOMS   Cough.   Fever.   Coughing up mucus.   Body aches.   Chest congestion.   Chills.   Shortness of breath.   Sore throat.  DIAGNOSIS  Acute bronchitis is usually diagnosed through a physical exam. Tests, such as chest X-rays, are sometimes done to rule out other conditions.  TREATMENT  Acute bronchitis usually goes away in a couple weeks. Often times, no medical treatment is necessary. Medicines are sometimes given for relief of fever or cough. Antibiotics are usually not needed but may be prescribed in certain situations. In some cases, an inhaler may be recommended to help reduce shortness of breath and control the cough. A cool mist vaporizer may also be used to help thin bronchial secretions and make it easier to clear the chest.  HOME CARE INSTRUCTIONS  Get plenty of rest.   Drink enough fluids to keep your urine clear or pale yellow (unless you have a medical condition that requires fluid restriction). Increasing fluids may help thin your secretions and will prevent dehydration.   Only take over-the-counter or prescription medicines as directed by your health care provider.   Avoid smoking and secondhand smoke. Exposure to cigarette smoke or irritating chemicals will make bronchitis worse. If you are a smoker, consider using nicotine gum or skin  patches to help control withdrawal symptoms. Quitting smoking will help your lungs heal faster.   Reduce the chances of another bout of acute bronchitis by washing your hands frequently, avoiding people with cold symptoms, and trying not to touch your hands to your mouth, nose, or eyes.   Follow up with your health care provider as directed.  SEEK MEDICAL CARE IF: Your symptoms do not improve after 1 week of treatment.  SEEK IMMEDIATE MEDICAL CARE IF:  You develop an increased fever or chills.   You have chest pain.   You have severe shortness of breath.  You have bloody sputum.   You develop dehydration.  You develop fainting.  You develop repeated vomiting.  You develop a severe headache. MAKE SURE YOU:   Understand these instructions.  Will watch your condition.  Will get help right away if you are not doing well or get worse. Document Released: 11/05/2004 Document Revised: 05/31/2013 Document Reviewed: 03/21/2013 Exit[employer] Patient Information 2014 [employer].  

## 2013-10-19 NOTE — Progress Notes (Signed)
   Subjective:    Patient ID: Alexis [name], male    DOB: 06-27-1986, 28 y.o.   MRN: [id number]  Cough This is a new problem. The current episode started in the past 7 days. The problem has been gradually worsening. The problem occurs every few hours. The cough is productive of purulent sputum. Associated symptoms include a fever, headaches and shortness of breath. Pertinent negatives include no chest pain, chills, ear congestion, ear pain, heartburn, hemoptysis, myalgias, nasal congestion, postnasal drip, rash, rhinorrhea, sore throat, sweats, weight loss or wheezing. He has tried OTC cough suppressant for the symptoms. The treatment provided mild relief. His past medical history is significant for asthma. There is no history of bronchiectasis, bronchitis, COPD, emphysema, environmental allergies or pneumonia.      Review of Systems  Constitutional: Positive for fever. Negative for chills, weight loss, diaphoresis, appetite change, fatigue and unexpected weight change.  HENT: Negative for ear pain, postnasal drip, rhinorrhea, sinus pressure, sore throat, trouble swallowing and voice change.   Eyes: Negative.   Respiratory: Positive for cough and shortness of breath. Negative for apnea, hemoptysis, choking, chest tightness, wheezing and stridor.   Cardiovascular: Negative.  Negative for chest pain, palpitations and leg swelling.  Gastrointestinal: Negative.  Negative for heartburn.  Endocrine: Negative.   Genitourinary: Negative.   Musculoskeletal: Negative.  Negative for myalgias.  Skin: Negative.  Negative for rash.  Allergic/Immunologic: Negative.  Negative for environmental allergies.  Neurological: Positive for headaches.  Hematological: Negative.   Psychiatric/Behavioral: Negative.        Objective:   Physical Exam  Vitals reviewed. Constitutional: He is oriented to person, place, and time. He appears well-developed and well-nourished.  Non-toxic appearance. He does not have a  sickly appearance. He does not appear ill. No distress.  HENT:  Mouth/Throat: Oropharynx is clear and moist. No oropharyngeal exudate.  Eyes: Conjunctivae are normal. Right eye exhibits no discharge. Left eye exhibits no discharge. No scleral icterus.  Neck: Normal range of motion. Neck supple. No JVD present. No tracheal deviation present. No thyromegaly present.  Cardiovascular: Normal rate and normal heart sounds.  Exam reveals no gallop and no friction rub.   No murmur heard. Pulmonary/Chest: Effort normal and breath sounds normal. No accessory muscle usage or stridor. Not tachypneic. No respiratory distress. He has no decreased breath sounds. He has no wheezes. He has no rhonchi. He has no rales.  Musculoskeletal: Normal range of motion. He exhibits no edema and no tenderness.  Lymphadenopathy:    He has no cervical adenopathy.  Neurological: He is oriented to person, place, and time.  Skin: Skin is warm and dry. No rash noted. He is not diaphoretic. No erythema. No pallor.     Lab Results  Component Value Date   WBC 10.2 01/09/2013   HGB 14.6 01/09/2013   HCT 43.3 01/09/2013   PLT 239.0 01/09/2013   GLUCOSE 109* 05/08/2013   CHOL 188 04/25/2012   TRIG 97.0 04/25/2012   HDL 42.90 04/25/2012   LDLCALC 126* 04/25/2012   ALT 38 05/08/2013   AST 26 05/08/2013   NA 137 05/08/2013   K 3.7 05/08/2013   CL 104 05/08/2013   CREATININE 1.0 05/08/2013   BUN 11 05/08/2013   CO2 27 05/08/2013   TSH 1.99 05/08/2013   HGBA1C 5.5 03/08/2007       Assessment & Plan:

## 2013-10-19 NOTE — Assessment & Plan Note (Signed)
Will treat the infection with a zpak and will control the cough with hycodan susp

## 2013-10-19 NOTE — Progress Notes (Signed)
Pre visit review using our clinic review tool, if applicable. No additional management support is needed unless otherwise documented below in the visit note. 

## 2013-10-26 ENCOUNTER — Encounter: Payer: Self-pay | Admitting: Internal Medicine

## 2013-10-30 ENCOUNTER — Other Ambulatory Visit: Payer: Self-pay | Admitting: Internal Medicine

## 2013-10-31 NOTE — Telephone Encounter (Signed)
TLJ pt

## 2013-10-31 NOTE — Telephone Encounter (Signed)
Pt saw you 06/2013--but looks like he has been seeing Dr Marcello Moores Jones---please advise if it is okay to refill--or decline

## 2013-11-13 ENCOUNTER — Telehealth: Payer: Self-pay | Admitting: Internal Medicine

## 2013-11-13 DIAGNOSIS — J4530 Mild persistent asthma, uncomplicated: Secondary | ICD-10-CM

## 2013-11-13 MED ORDER — BECLOMETHASONE DIPROPIONATE 80 MCG/ACT IN AERS: 1.0000 | INHALATION_SPRAY | Freq: Two times a day (BID) | RESPIRATORY_TRACT | Status: DC

## 2013-11-13 NOTE — Telephone Encounter (Signed)
Patient notified pf MD advice. He states that he stopped dulera over one week ago but still has a "weird feeling" with lungs. He would like to know should the dulera be out of his system by now and wonders how long he should feel this way until he should see MD.

## 2013-11-13 NOTE — Telephone Encounter (Signed)
Stop dulera, change to qvar

## 2013-11-13 NOTE — Telephone Encounter (Signed)
The Alexis [name] is out of his system He needs to be seen

## 2013-11-13 NOTE — Telephone Encounter (Signed)
Patient states that when he takes the [facility] he was prescribed it makes him feel as though something is wrong with his lungs. He states that he did not feel SOB but says his lungs "felt weird." Patient says he has stopped taking it because of this but wants to let Dr. Adah Salvage know to see what he might recommend. Please advise.

## 2013-11-13 NOTE — Telephone Encounter (Signed)
Pt called to follow up on the request. Please call pt °

## 2013-11-14 ENCOUNTER — Ambulatory Visit (INDEPENDENT_AMBULATORY_CARE_PROVIDER_SITE_OTHER)
Admission: RE | Admit: 2013-11-14 | Discharge: 2013-11-14 | Disposition: A | Discharge status: Home / Self Care | Payer: BC Managed Care – PPO | Source: Ambulatory Visit | Attending: Internal Medicine | Admitting: Internal Medicine

## 2013-11-14 ENCOUNTER — Encounter: Payer: Self-pay | Admitting: Internal Medicine

## 2013-11-14 ENCOUNTER — Ambulatory Visit (INDEPENDENT_AMBULATORY_CARE_PROVIDER_SITE_OTHER): Payer: BC Managed Care – PPO | Admitting: Internal Medicine

## 2013-11-14 VITALS — BP 134/94 | HR 76 | Temp 98.5°F | Resp 16 | Wt >= 6400 oz

## 2013-11-14 DIAGNOSIS — F988 Other specified behavioral and emotional disorders with onset usually occurring in childhood and adolescence: Secondary | ICD-10-CM

## 2013-11-14 DIAGNOSIS — J310 Chronic rhinitis: Secondary | ICD-10-CM

## 2013-11-14 DIAGNOSIS — R0600 Dyspnea, unspecified: Secondary | ICD-10-CM

## 2013-11-14 DIAGNOSIS — R0602 Shortness of breath: Secondary | ICD-10-CM | POA: Insufficient documentation

## 2013-11-14 DIAGNOSIS — R0989 Other specified symptoms and signs involving the circulatory and respiratory systems: Secondary | ICD-10-CM

## 2013-11-14 DIAGNOSIS — R0609 Other forms of dyspnea: Secondary | ICD-10-CM

## 2013-11-14 DIAGNOSIS — F329 Major depressive disorder, single episode, unspecified: Secondary | ICD-10-CM

## 2013-11-14 DIAGNOSIS — F3289 Other specified depressive episodes: Secondary | ICD-10-CM

## 2013-11-14 DIAGNOSIS — J019 Acute sinusitis, unspecified: Secondary | ICD-10-CM

## 2013-11-14 MED ORDER — BUPROPION HCL 100 MG PO TABS: 100.0000 mg | ORAL_TABLET | Freq: Two times a day (BID) | ORAL | Status: DC

## 2013-11-14 MED ORDER — METHYLPREDNISOLONE ACETATE 80 MG/ML IJ SUSP
80.0000 mg | Freq: Once | INTRAMUSCULAR | Status: AC
  Administered 2013-11-14: 80 mg via INTRAMUSCULAR

## 2013-11-14 MED ORDER — BUPROPION HCL ER (SR) 100 MG PO TB12: 100.0000 mg | ORAL_TABLET | Freq: Every day | ORAL | Status: DC

## 2013-11-14 MED ORDER — LEVOFLOXACIN 500 MG PO TABS: 500.0000 mg | ORAL_TABLET | Freq: Every day | ORAL | Status: DC

## 2013-11-14 NOTE — Patient Instructions (Signed)
   Milk free trial (no milk, ice cream, cheese and yogurt) for 4-6 weeks. OK to use almond, coconut, rice or soy milk. "Almond breeze" brand tastes good.  

## 2013-11-14 NOTE — Assessment & Plan Note (Signed)
Hold Wellbutrin Re-start XR if ok   Milk free trial (no milk, ice cream, cheese and yogurt) for 4-6 weeks. OK to use almond, coconut, rice or soy milk. "Almond breeze" brand tastes good.

## 2013-11-14 NOTE — Progress Notes (Signed)
Pre visit review using our clinic review tool, if applicable. No additional management support is needed unless otherwise documented below in the visit note. 

## 2013-11-14 NOTE — Assessment & Plan Note (Signed)
Wt Readings from Last 3 Encounters:  11/14/13 404 lb (183.253 kg)  10/19/13 405 lb (183.707 kg)  06/16/13 397 lb 8 oz (180.305 kg)

## 2013-11-14 NOTE — Telephone Encounter (Signed)
Patient notified and appointment set with MD on 11/14/13.

## 2013-11-14 NOTE — Assessment & Plan Note (Signed)
Wellbutrin - hold then change to SR

## 2013-11-14 NOTE — Assessment & Plan Note (Signed)
Zyrtec Milk free trial (no milk, ice cream, cheese and yogurt) for 4-6 weeks. OK to use almond, coconut, rice or soy milk. "Almond breeze" brand tastes good.

## 2013-11-14 NOTE — Assessment & Plan Note (Signed)
Levaquin x 10 d 

## 2013-11-14 NOTE — Assessment & Plan Note (Signed)
Wellbutrin - hold then change to SR 

## 2013-11-21 ENCOUNTER — Ambulatory Visit (INDEPENDENT_AMBULATORY_CARE_PROVIDER_SITE_OTHER)
Admission: RE | Admit: 2013-11-21 | Discharge: 2013-11-21 | Disposition: A | Discharge status: Home / Self Care | Payer: BC Managed Care – PPO | Source: Ambulatory Visit | Attending: Physician Assistant | Admitting: Physician Assistant

## 2013-11-21 ENCOUNTER — Encounter: Payer: Self-pay | Admitting: Physician Assistant

## 2013-11-21 ENCOUNTER — Ambulatory Visit (INDEPENDENT_AMBULATORY_CARE_PROVIDER_SITE_OTHER): Payer: BC Managed Care – PPO | Admitting: Physician Assistant

## 2013-11-21 ENCOUNTER — Encounter: Payer: Self-pay | Admitting: *Deleted

## 2013-11-21 VITALS — BP 124/84 | HR 124 | Temp 101.5°F

## 2013-11-21 DIAGNOSIS — J40 Bronchitis, not specified as acute or chronic: Secondary | ICD-10-CM

## 2013-11-21 DIAGNOSIS — J45909 Unspecified asthma, uncomplicated: Secondary | ICD-10-CM

## 2013-11-21 DIAGNOSIS — J4530 Mild persistent asthma, uncomplicated: Secondary | ICD-10-CM

## 2013-11-21 MED ORDER — HYDROCODONE-HOMATROPINE 5-1.5 MG/5ML PO SYRP: 5.0000 mL | ORAL_SOLUTION | Freq: Three times a day (TID) | ORAL | Status: DC | PRN

## 2013-11-21 NOTE — Patient Instructions (Signed)
Hope you feel better soon.  If no improvement of symptoms please return to office Friday, for further evaluation.  Bronchitis Bronchitis is swelling (inflammation) of the air tubes leading to your lungs (bronchi). This causes mucus and a cough. If the swelling gets bad, you may have trouble breathing. HOME CARE   Rest.  Drink enough fluids to keep your pee (urine) clear or pale yellow (unless you have a condition where you have to watch how much you drink).  Only take medicine as told by your doctor. If you were given antibiotic medicines, finish them even if you start to feel better.  Avoid smoke, irritating chemicals, and strong smells. These make the problem worse. Quit smoking if you smoke. This helps your lungs heal faster.  Use a cool mist humidifier. Change the water in the humidifier every day. You can also sit in the bathroom with hot shower running for 5 10 minutes. Keep the door closed.  See your health care provider as told.  Wash your hands often. GET HELP IF: Your problems do not get better after 1 week. GET HELP RIGHT AWAY IF:   Your fever gets worse.  You have chills.  Your chest hurts.  Your problems breathing get worse.  You have blood in your mucus.  You pass out (faint).  You feel lightheaded.  You have a bad headache.  You throw up (vomit) again and again. MAKE SURE YOU:  Understand these instructions.  Will watch your condition.  Will get help right away if you are not doing well or get worse. Document Released: 03/16/2008 Document Revised: 07/19/2013 Document Reviewed: 05/23/2013 [facility] Patient Information 2014 [place], [place].

## 2013-11-21 NOTE — Progress Notes (Signed)
Pre-visit discussion using our clinic review tool. No additional management support is needed unless otherwise documented below in the visit note.  

## 2013-11-21 NOTE — Progress Notes (Signed)
   Subjective:    Patient ID: Alexis [name], male    DOB: 11-Dec-1985, 28 y.o.   MRN: [id number]  HPI Comments: Patient is a 28 year old male who presents to the office with complaint of cough. States has been seen at this office twice in the last month for similar complaints. Initially rx Z-pack and inhaler, had no improvement of symptoms. Was seen a second time and placed on Levaquin which he is still taking. Xray on 2/3, negative for pneumonia. States he felt he was getting better over this past weekend and yesterday. This morning woke with a cough and throughout day has felt worse with aches and pains and subjective fever. Cough is non productive, have sinus drainage with post nasal drainage.  Denies chest pain, SOB, palpitations, N/V, diarrhea or constipation.   Review of Systems  Constitutional: Positive for fever. Negative for chills.  HENT: Positive for postnasal drip, rhinorrhea and sinus pressure. Negative for ear pain and sore throat.   Eyes: Negative for visual disturbance.  Respiratory: Positive for cough. Negative for chest tightness, shortness of breath and wheezing.   Cardiovascular: Negative for chest pain and palpitations.  Gastrointestinal: Negative for nausea, vomiting and diarrhea.  Skin: Negative for rash.  Neurological: Negative for dizziness and light-headedness.  All other systems reviewed and are negative.   Past Medical History  Diagnosis Date  . ALLERGIC RHINITIS 06/10/2009  . ADD 09/13/2007  . ASTHMA, UNSPECIFIED, UNSPECIFIED STATUS 07/19/2008  . DEPRESSIVE DISORDER 09/13/2007  . DYSMETABOLIC SYNDROME 2/58/5277  . GERD 09/13/2007  . HYPERLIPIDEMIA 10/10/2009  . HYPERTENSION 09/13/2007  . IBS 03/20/2009  . Morbid obesity 07/19/2008  . Skin cancer        Objective:   Physical Exam  Vitals reviewed. Constitutional: He is oriented to person, place, and time. He appears well-developed and well-nourished.  HENT:  Head: Normocephalic and atraumatic.  Right Ear:  Hearing, tympanic membrane, external ear and ear canal normal. No tenderness.  Left Ear: Hearing, tympanic membrane, external ear and ear canal normal. No tenderness.  Nose: Rhinorrhea present. Right sinus exhibits no maxillary sinus tenderness and no frontal sinus tenderness. Left sinus exhibits no maxillary sinus tenderness and no frontal sinus tenderness.  Mouth/Throat: Uvula is midline, oropharynx is clear and moist and mucous membranes are normal.  Eyes: Conjunctivae are normal. Pupils are equal, round, and reactive to light. No scleral icterus.  Neck: Normal range of motion.  Cardiovascular: Normal rate, regular rhythm and intact distal pulses.  Exam reveals no gallop and no friction rub.   No murmur heard. Pulmonary/Chest: Effort normal and breath sounds normal. No respiratory distress. He has no wheezes. He has no rhonchi. He has no rales.  Abdominal: Soft. There is no tenderness.  Musculoskeletal: Normal range of motion.  Neurological: He is alert and oriented to person, place, and time.  Skin: Skin is warm and dry.  Psychiatric: He has a normal mood and affect.   Filed Vitals:   11/21/13 1418  BP: 124/84  Pulse: 124  Temp: 101.5 F (38.6 C)       Assessment & Plan:    Bronchitis/sinusitis Will continue remaining levaquin, get chest xray today. Counseled patient his symptoms are viral since they are not responding to the abx, recommend rest, fluids. Refill Hycodan for cough Patient to RTO if no improvement of symptoms by Friday.  Work note till 11/27/13.

## 2013-11-22 ENCOUNTER — Ambulatory Visit: Payer: Self-pay | Admitting: Internal Medicine

## 2013-11-24 ENCOUNTER — Ambulatory Visit (INDEPENDENT_AMBULATORY_CARE_PROVIDER_SITE_OTHER): Payer: BC Managed Care – PPO | Admitting: Internal Medicine

## 2013-11-24 ENCOUNTER — Ambulatory Visit: Payer: Self-pay | Admitting: Family Medicine

## 2013-11-24 ENCOUNTER — Encounter: Payer: Self-pay | Admitting: Internal Medicine

## 2013-11-24 VITALS — BP 138/94 | HR 110 | Temp 99.4°F | Wt 397.5 lb

## 2013-11-24 DIAGNOSIS — J111 Influenza due to unidentified influenza virus with other respiratory manifestations: Secondary | ICD-10-CM

## 2013-11-24 MED ORDER — OSELTAMIVIR PHOSPHATE 75 MG PO CAPS: 75.0000 mg | ORAL_CAPSULE | Freq: Two times a day (BID) | ORAL | Status: DC

## 2013-11-24 MED ORDER — ALBUTEROL SULFATE HFA 108 (90 BASE) MCG/ACT IN AERS: 2.0000 | INHALATION_SPRAY | Freq: Four times a day (QID) | RESPIRATORY_TRACT | Status: DC | PRN

## 2013-11-24 NOTE — Progress Notes (Signed)
Pre-visit discussion using our clinic review tool. No additional management support is needed unless otherwise documented below in the visit note.  

## 2013-11-24 NOTE — Progress Notes (Signed)
HPI  Pt presents to the clinic today with c/o worsening cough, shortness of breath and wheezing. He was seen 2/3 for the same. He was diagnosed with bronchitis and given a zpack. The zpack was ineffective so he was switched to Levaquin. He was seen on 2/13 for worsening symptoms. Chest xray was negative for pneumonia. He reports the cough is productive of thick green/brown mucous. He reports a burning sensation in his chest that is worse when he coughs. The hycodan and mucinex have not helped. He does have a history of allergies and asthma. He has had sick contacts.  Review of Systems      Past Medical History  Diagnosis Date  . ALLERGIC RHINITIS 06/10/2009  . ADD 09/13/2007  . ASTHMA, UNSPECIFIED, UNSPECIFIED STATUS 07/19/2008  . DEPRESSIVE DISORDER 09/13/2007  . DYSMETABOLIC SYNDROME 5/40/9811  . GERD 09/13/2007  . HYPERLIPIDEMIA 10/10/2009  . HYPERTENSION 09/13/2007  . IBS 03/20/2009  . Morbid obesity 07/19/2008  . Skin cancer     Family History  Problem Relation Age of Onset  . Hypertension Father   . Emphysema Maternal Grandmother     smoker  . Allergies      whole family-both sides of family  . Asthma Mother   . Asthma Paternal Grandmother   . Asthma Maternal Grandmother   . Heart disease      MGM deceased with MI; both sides of grandparents  . Clotting disorder Mother     Factor 5  . Clotting disorder Maternal Uncle     Factor 5  . Rheum arthritis Mother   . Skin cancer Mother   . Lung cancer Maternal Grandmother   . Diabetes Father     type 2   . Diabetes Paternal Grandmother     type 2    History   Social History  . Marital Status: Single    Spouse Name: N/A    Number of Children: N/A  . Years of Education: N/A   Occupational History  . Not on file.   Social History Main Topics  . Smoking status: Never Smoker   . Smokeless tobacco: Never Used  . Alcohol Use: No     Comment: socially  . Drug Use: No  . Sexual Activity: Not Currently   Other Topics  Concern  . Not on file   Social History Narrative  . No narrative on file    Allergies  Allergen Reactions  . Citrus Anaphylaxis and Other (See Comments)    Lemon  . Amoxicillin-Pot Clavulanate     nausea  . Sulfamethoxazole-Trimethoprim      Constitutional: Positive headache, fatigue and fever. Denies abrupt weight changes.  HEENT:  Positive nasal congestion, sore throat. Denies eye redness, eye pain, pressure behind the eyes, facial pain, ear pain, ringing in the ears, wax buildup, runny nose or bloody nose. Respiratory: Positive cough. Denies difficulty breathing or shortness of breath.  Cardiovascular: Denies chest pain, chest tightness, palpitations or swelling in the hands or feet.   No other specific complaints in a complete review of systems (except as listed in HPI above).  Objective:   BP 138/94  Pulse 110  Temp(Src) 99.4 F (37.4 C) (Oral)  Wt 397 lb 8 oz (180.305 kg)  SpO2 99% Wt Readings from Last 3 Encounters:  11/24/13 397 lb 8 oz (180.305 kg)  11/14/13 404 lb (183.253 kg)  10/19/13 405 lb (183.707 kg)     General: Appears his stated age, obese but well developed, well nourished in  NAD. HEENT: Head: normal shape and size; Eyes: sclera white, no icterus, conjunctiva pink, PERRLA and EOMs intact; Ears: Tm's gray and intact, normal light reflex; Nose: mucosa pink and moist, septum midline; Throat/Mouth: + PND. Teeth present, mucosa erythematous and moist, no exudate noted, no lesions or ulcerations noted.  Neck: Mild cervical lymphadenopathy. Neck supple, trachea midline. No massses, lumps or thyromegaly present.  Cardiovascular: Normal rate and rhythm. S1,S2 noted.  No murmur, rubs or gallops noted. No JVD or BLE edema. No carotid bruits noted. Pulmonary/Chest: Normal effort and positive vesicular breath sounds. No respiratory distress. No wheezes, rales or ronchi noted.      Assessment & Plan:   Influenza:  Rapid Flu: positive Get some rest and drink  plenty of water Do salt water gargles for the sore throat eRx for Tamiflu and albuterol inhaler  RTC as needed or if symptoms persist, if worse go to ER

## 2013-11-24 NOTE — Patient Instructions (Addendum)

## 2013-11-29 ENCOUNTER — Telehealth: Payer: Self-pay | Admitting: *Deleted

## 2013-11-29 NOTE — Telephone Encounter (Signed)
[facility] Triage Call Report Triage Record Num: [id number] Operator: Jani Files Patient Name: Alexis [name] Call Date & Time: 11/27/2013 4:38:30PM Patient Phone: [phone] PCP: Scarlette Calico Patient Gender: Male PCP Fax : Patient DOB: 26-Feb-1986 Practice Name: [facility] Reason for Call: Caller: Lisa/Mother; PCP: Scarlette Calico (Adults only); CB#: [phone]; Call regarding Swollen face and neck, red and tender; Mom states patient developed a swollen lymph node in the left side of his neck, onset 11/26/13. States lymph node is approx. the size of a grape and is tender to touch. Mom states patient awakened with swelling of the entire left side of his face, "from his ear down to his chin" 11/27/13. Mom states area of swelling is red and warm to touch. No redness or swelling around eye. States patient has had intermittent fever X 2 weeks. Mom states patient was initially seen in the office on 11/14/12 for cough and prescribed Levaquin. States patient completed Levaquin 11/23/13. States patient was again seen in the office on 11/24/13 and diagnosed with Influenza. Temp. 100.6 orally at 1545 11/27/13. Patient had Advil 400mg . at 1545 11/27/13. No difficulty swallowing or drooling. No difficulty breathing or wheezing. Patient denies toothache. Triage per Swollen Lymph Node Protocol. No emergent sx identified. Disposition o f" See Provider within 4 hours" obtained related to positive triage assessment for " New symptoms of localized infection and new onset of swollen glands." Mom advised for patient to be seen in the ED for evaluation. Mom declines. Mom is requesting MD on call to be notified. 1703: Dr. Scarlette Calico notified of above. Orders received: Patient to be evaluated in the ED. Mom informed of above. Mom verbalizes understanding and agreeable. Mom states patient will go to the ED at [facility] for evaluation. Protocol(s) Used: Swollen Lymph Nodes Recommended Outcome per Protocol:  See Provider within 4 hours Reason for Outcome: New symptoms of localized infection (redness, tenderness, warm to touch, increasing in size, etc.) AND new onset of swollen glands closest to the infected area. Care Advice: ~ 02/

## 2013-12-12 ENCOUNTER — Other Ambulatory Visit: Payer: Self-pay | Admitting: Internal Medicine

## 2013-12-13 ENCOUNTER — Other Ambulatory Visit: Payer: Self-pay | Admitting: Internal Medicine

## 2013-12-13 ENCOUNTER — Encounter: Payer: Self-pay | Admitting: Internal Medicine

## 2013-12-19 ENCOUNTER — Telehealth: Payer: Self-pay | Admitting: *Deleted

## 2013-12-19 NOTE — Telephone Encounter (Signed)
Patient's mother phoned stating she had dropped off a form yesterday to be completed and faxed to [facility] & was checking on status.  [place] states they have not received form as of yet.  CB# [phone]

## 2013-12-19 NOTE — Telephone Encounter (Signed)
Patient's mother phoned triage line again at 52. (first call at [phone])

## 2014-01-31 ENCOUNTER — Other Ambulatory Visit: Payer: Self-pay | Admitting: Internal Medicine

## 2014-02-22 ENCOUNTER — Other Ambulatory Visit: Payer: Self-pay | Admitting: Internal Medicine

## 2014-03-06 ENCOUNTER — Emergency Department (INDEPENDENT_AMBULATORY_CARE_PROVIDER_SITE_OTHER): Payer: BC Managed Care – PPO

## 2014-03-06 ENCOUNTER — Emergency Department (HOSPITAL_COMMUNITY)
Admission: EM | Admit: 2014-03-06 | Discharge: 2014-03-06 | Disposition: A | Discharge status: Nursing Home (Includes ICF) | Payer: BC Managed Care – PPO | Source: Home / Self Care | Attending: Family Medicine | Admitting: Family Medicine

## 2014-03-06 ENCOUNTER — Encounter (HOSPITAL_COMMUNITY): Payer: Self-pay | Admitting: Emergency Medicine

## 2014-03-06 ENCOUNTER — Emergency Department (HOSPITAL_COMMUNITY)
Admission: EM | Admit: 2014-03-06 | Discharge: 2014-03-06 | Disposition: A | Discharge status: Home / Self Care | Payer: BC Managed Care – PPO | Attending: Emergency Medicine | Admitting: Emergency Medicine

## 2014-03-06 DIAGNOSIS — R079 Chest pain, unspecified: Secondary | ICD-10-CM

## 2014-03-06 DIAGNOSIS — F3289 Other specified depressive episodes: Secondary | ICD-10-CM | POA: Insufficient documentation

## 2014-03-06 DIAGNOSIS — K219 Gastro-esophageal reflux disease without esophagitis: Secondary | ICD-10-CM | POA: Insufficient documentation

## 2014-03-06 DIAGNOSIS — I10 Essential (primary) hypertension: Secondary | ICD-10-CM | POA: Insufficient documentation

## 2014-03-06 DIAGNOSIS — Z85828 Personal history of other malignant neoplasm of skin: Secondary | ICD-10-CM | POA: Insufficient documentation

## 2014-03-06 DIAGNOSIS — Z7982 Long term (current) use of aspirin: Secondary | ICD-10-CM | POA: Insufficient documentation

## 2014-03-06 DIAGNOSIS — Z79899 Other long term (current) drug therapy: Secondary | ICD-10-CM | POA: Insufficient documentation

## 2014-03-06 DIAGNOSIS — J45909 Unspecified asthma, uncomplicated: Secondary | ICD-10-CM | POA: Insufficient documentation

## 2014-03-06 DIAGNOSIS — E785 Hyperlipidemia, unspecified: Secondary | ICD-10-CM | POA: Insufficient documentation

## 2014-03-06 DIAGNOSIS — J40 Bronchitis, not specified as acute or chronic: Secondary | ICD-10-CM

## 2014-03-06 DIAGNOSIS — F329 Major depressive disorder, single episode, unspecified: Secondary | ICD-10-CM | POA: Insufficient documentation

## 2014-03-06 DIAGNOSIS — Z8719 Personal history of other diseases of the digestive system: Secondary | ICD-10-CM | POA: Insufficient documentation

## 2014-03-06 DIAGNOSIS — R0789 Other chest pain: Secondary | ICD-10-CM | POA: Insufficient documentation

## 2014-03-06 HISTORY — DX: Gastro-esophageal reflux disease without esophagitis: K21.9

## 2014-03-06 LAB — CBC
HCT: 42.1 % (ref 39.0–52.0)
HEMOGLOBIN: 14.4 g/dL (ref 13.0–17.0)
MCH: 28.5 pg (ref 26.0–34.0)
MCHC: 34.2 g/dL (ref 30.0–36.0)
MCV: 83.4 fL (ref 78.0–100.0)
PLATELETS: 257 10*3/uL (ref 150–400)
RBC: 5.05 MIL/uL (ref 4.22–5.81)
RDW: 14.6 % (ref 11.5–15.5)
WBC: 12.5 10*3/uL — ABNORMAL HIGH (ref 4.0–10.5)

## 2014-03-06 LAB — BASIC METABOLIC PANEL
BUN: 12 mg/dL (ref 6–23)
CALCIUM: 9.5 mg/dL (ref 8.4–10.5)
CO2: 27 mEq/L (ref 19–32)
Chloride: 102 mEq/L (ref 96–112)
Creatinine, Ser: 0.98 mg/dL (ref 0.50–1.35)
GFR calc Af Amer: >90 mL/min (ref >90)
GLUCOSE: 88 mg/dL (ref 70–99)
POTASSIUM: 4.1 meq/L (ref 3.7–5.3)
Sodium: 141 mEq/L (ref 137–147)

## 2014-03-06 LAB — I-STAT TROPONIN, ED: TROPONIN I, POC: 0 ng/mL (ref 0.00–0.08)

## 2014-03-06 MED ORDER — ASPIRIN 81 MG PO CHEW
CHEWABLE_TABLET | ORAL | Status: AC
  Filled 2014-03-06: qty 4

## 2014-03-06 MED ORDER — ASPIRIN 81 MG PO CHEW
324.0000 mg | CHEWABLE_TABLET | Freq: Once | ORAL | Status: AC
  Administered 2014-03-06: 324 mg via ORAL

## 2014-03-06 MED ORDER — PREDNISONE 20 MG PO TABS: 60.0000 mg | ORAL_TABLET | Freq: Every day | ORAL | Status: DC

## 2014-03-06 MED ORDER — AZITHROMYCIN 250 MG PO TABS: 250.0000 mg | ORAL_TABLET | Freq: Every day | ORAL | Status: DC

## 2014-03-06 MED ORDER — BENZONATATE 100 MG PO CAPS: 100.0000 mg | ORAL_CAPSULE | Freq: Three times a day (TID) | ORAL | Status: DC

## 2014-03-06 NOTE — ED Notes (Signed)
Pt reports taking advil cold and sinus this morning and since has been having chest palpitation and now chest pain.  Pt was see [facility] and told to come to ED for bloodwork

## 2014-03-06 NOTE — ED Provider Notes (Signed)
CSN: [id number]     Arrival date & time 03/06/14  1613 History   First MD Initiated Contact with Patient 03/06/14 1707     Chief Complaint  Patient presents with  . Chest Pain   (Consider location/radiation/quality/duration/timing/severity/associated sxs/prior Treatment) HPI  28 year old M presenting with chest pain. 1 day duration. Left sided. Non radiating.  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 Improved with rest and currently a 1/10. He usually takes a baby aspirin each day, but has not been doing that while taking tylenol sinus for congestion. He denies nausea or vomiting, but has had sweating. He has a history of hypertension since age 89 for which he takes losartan. He does not smoke or have high cholesterol. He has a strong family history of CAD, including numerous male deaths years younger than 12 y.o. on his mother's side. He has previously been evaluated at [facility] cardiology in 2013, but denied any invasive study.   Past Medical History  Diagnosis Date  . ALLERGIC RHINITIS 06/10/2009  . ADD 09/13/2007  . ASTHMA, UNSPECIFIED, UNSPECIFIED STATUS 07/19/2008  . DEPRESSIVE DISORDER 09/13/2007  . DYSMETABOLIC SYNDROME 2/45/8099  . GERD 09/13/2007  . HYPERLIPIDEMIA 10/10/2009  . HYPERTENSION 09/13/2007  . IBS 03/20/2009  . Morbid obesity 07/19/2008  . Skin cancer    Past Surgical History  Procedure Laterality Date  . Appendectomy  2004  . Ankle surgery  2007  . Skin cancer excision      teenager   Family History  Problem Relation Age of Onset  . Hypertension Father   . Emphysema Maternal Grandmother     smoker  . Allergies      whole family-both sides of family  . Asthma Mother   . Asthma Paternal Grandmother   . Asthma Maternal Grandmother   . Heart disease      MGM deceased with MI; both sides of grandparents  . Clotting disorder Mother     Factor 5  . Clotting disorder Maternal Uncle     Factor 5  . Rheum arthritis Mother   . Skin cancer Mother   . Lung cancer  Maternal Grandmother   . Diabetes Father     type 2   . Diabetes Paternal Grandmother     type 2   History  Substance Use Topics  . Smoking status: Never Smoker   . Smokeless tobacco: Never Used  . Alcohol Use: No     Comment: socially    Review of Systems  Allergies  Citrus; Amoxicillin-pot clavulanate; and Sulfamethoxazole-trimethoprim  Home Medications   Prior to Admission medications   Medication Sig Start Date End Date Taking? Authorizing Provider  albuterol (PROVENTIL HFA;VENTOLIN HFA) 108 (90 BASE) MCG/ACT inhaler Inhale 2 puffs into the lungs every 6 (six) hours as needed for wheezing or shortness of breath. 11/24/13   Webb Silversmith, NP  ALPRAZolam Duanne Moron) 1 MG tablet Take 1 tablet (1 mg total) by mouth 3 (three) times daily as needed. For anxiety 01/13/13   Janith Lima, MD  aspirin EC 81 MG tablet Take 81 mg by mouth daily.    Historical Provider, MD  buPROPion (WELLBUTRIN SR) 100 MG 12 hr tablet Take 1 tablet (100 mg total) by mouth daily. 11/14/13   Aleksei Plotnikov V, MD  buPROPion (WELLBUTRIN) 75 MG tablet TAKE 1 TABLET BY MOUTH DAILY 01/31/14   Janith Lima, MD  CELEBREX 200 MG capsule TAKE ONE CAPSULE BY MOUTH EVERY DAY 12/12/13   Janith Lima,  MD  EPINEPHrine (EPI-PEN) 0.3 mg/0.3 mL DEVI Inject 0.3 mLs (0.3 mg total) into the muscle once. 02/23/13   Deneise Lever, MD  HYDROcodone-homatropine (HYCODAN) 5-1.5 MG/5ML syrup Take 5 mLs by mouth every 8 (eight) hours as needed for cough. 11/21/13   Stacy Gardner, PA-C  losartan (COZAAR) 100 MG tablet TAKE 1 TABLET (100 MG TOTAL) BY MOUTH DAILY. 02/22/14   Janith Lima, MD  losartan (COZAAR) 100 MG tablet TAKE 1 TABLET (100 MG TOTAL) BY MOUTH DAILY. 02/22/14   Janith Lima, MD  omeprazole (PRILOSEC) 40 MG capsule TAKE 1 CAPSULE BY MOUTH TWICE A DAY 09/02/13   Janith Lima, MD  oseltamivir (TAMIFLU) 75 MG capsule Take 1 capsule (75 mg total) by mouth 2 (two) times daily. 11/24/13   Webb Silversmith, NP  verapamil (CALAN-SR)  120 MG CR tablet TAKE ONE CAPSULE BY MOUTH EVERY DAY 02/22/14   Janith Lima, MD  verapamil (CALAN-SR) 120 MG CR tablet TAKE ONE CAPSULE BY MOUTH EVERY DAY 02/22/14   Janith Lima, MD   BP 134/73  Pulse 119  Temp(Src) 97.5 F (36.4 C) (Oral)  Resp 16  SpO2 99% Physical Exam  Constitutional: He appears well-developed and well-nourished. No distress.  Morbidly obese  HENT:  Head: Normocephalic and atraumatic.  Eyes: Conjunctivae are normal. Pupils are equal, round, and reactive to light.  Neck: Normal range of motion. No JVD present.  Cardiovascular: Regular rhythm and normal heart sounds.  Tachycardia present.   Pulmonary/Chest: Effort normal and breath sounds normal.  Abdominal: Soft. Bowel sounds are normal.    ED Course  Procedures (including critical care time) Labs Review Labs Reviewed - No data to display  Imaging Review Dg Chest 2 View  03/06/2014   CLINICAL DATA:  28 year old male left side chest pain. Initial encounter.  EXAM: CHEST  2 VIEW  COMPARISON:  11/21/2013 and earlier.  FINDINGS: Stable and normal lung volumes. Normal cardiac size and mediastinal contours. Visualized tracheal air column is within normal limits. The lungs are clear. No pneumothorax or effusion. No acute osseous abnormality identified.  IMPRESSION: Negative, no acute cardiopulmonary abnormality.   Electronically Signed   By: Lars Pinks M.D.   On: 03/06/2014 18:45     MDM   1. Chest pain with moderate risk for cardiac etiology    Normal EKG and CXR. Concerning that chest pain worsened with exertion and patient has obesity, HTN, and very strong family history. Unfortunately, I cannot complete HEART risk tool without a troponin level. Therefore, the patient will be sent to the ED. He is agreeable to this plan.     Angelica Ran, MD 03/06/14 [phone]

## 2014-03-06 NOTE — ED Provider Notes (Signed)
CSN: [id number]     Arrival date & time 03/06/14  1911 History   None    Chief Complaint  Patient presents with  . Palpitations     (Consider location/radiation/quality/duration/timing/severity/associated sxs/prior Treatment) HPI Comments: Patient referred to the ER from urgent care for evaluation of chest pain and palpitations. Patient reports that he has been sick for approximately a month with cough and congestion. He was advised to try Advil cough and sinus which he began yesterday. He took a total of 6 tablets yesterday. Over the course of today he was noticing palpitations and had some discomfort in the center of his chest. He was referred to the ER for further evaluation because he has a strong family history of early heart disease.  She reports occasional wheezing, does have an inhaler. Cough has been at times productive of sputum and at other times harsh and nonproductive.  Patient is a 28 y.o. male presenting with palpitations.  Palpitations Associated symptoms: chest pain and cough     Past Medical History  Diagnosis Date  . ALLERGIC RHINITIS 06/10/2009  . ADD 09/13/2007  . ASTHMA, UNSPECIFIED, UNSPECIFIED STATUS 07/19/2008  . DEPRESSIVE DISORDER 09/13/2007  . DYSMETABOLIC SYNDROME 8/93/8101  . GERD 09/13/2007  . HYPERLIPIDEMIA 10/10/2009  . HYPERTENSION 09/13/2007  . IBS 03/20/2009  . Morbid obesity 07/19/2008  . Skin cancer   . Acid reflux    Past Surgical History  Procedure Laterality Date  . Appendectomy  2004  . Ankle surgery  2007  . Skin cancer excision      teenager   Family History  Problem Relation Age of Onset  . Hypertension Father   . Emphysema Maternal Grandmother     smoker  . Allergies      whole family-both sides of family  . Asthma Mother   . Asthma Paternal Grandmother   . Asthma Maternal Grandmother   . Heart disease      MGM deceased with MI; both sides of grandparents  . Clotting disorder Mother     Factor 5  . Clotting disorder Maternal  Uncle     Factor 5  . Rheum arthritis Mother   . Skin cancer Mother   . Lung cancer Maternal Grandmother   . Diabetes Father     type 2   . Diabetes Paternal Grandmother     type 2   History  Substance Use Topics  . Smoking status: Never Smoker   . Smokeless tobacco: Never Used  . Alcohol Use: No     Comment: socially    Review of Systems  Respiratory: Positive for cough.   Cardiovascular: Positive for chest pain and palpitations.  All other systems reviewed and are negative.     Allergies  Citrus; Amoxicillin-pot clavulanate; and Sulfamethoxazole-trimethoprim  Home Medications   Prior to Admission medications   Medication Sig Start Date End Date Taking? Authorizing Provider  albuterol (PROVENTIL HFA;VENTOLIN HFA) 108 (90 BASE) MCG/ACT inhaler Inhale 2 puffs into the lungs every 6 (six) hours as needed for wheezing or shortness of breath. 11/24/13   Webb Silversmith, NP  ALPRAZolam Duanne Moron) 1 MG tablet Take 1 tablet (1 mg total) by mouth 3 (three) times daily as needed. For anxiety 01/13/13   Janith Lima, MD  aspirin EC 81 MG tablet Take 81 mg by mouth daily.    Historical Provider, MD  buPROPion (WELLBUTRIN SR) 100 MG 12 hr tablet Take 1 tablet (100 mg total) by mouth daily. 11/14/13   Aleksei Plotnikov  V, MD  buPROPion (WELLBUTRIN) 75 MG tablet TAKE 1 TABLET BY MOUTH DAILY 01/31/14   Janith Lima, MD  CELEBREX 200 MG capsule TAKE ONE CAPSULE BY MOUTH EVERY DAY 12/12/13   Janith Lima, MD  EPINEPHrine (EPI-PEN) 0.3 mg/0.3 mL DEVI Inject 0.3 mLs (0.3 mg total) into the muscle once. 02/23/13   Deneise Lever, MD  HYDROcodone-homatropine (HYCODAN) 5-1.5 MG/5ML syrup Take 5 mLs by mouth every 8 (eight) hours as needed for cough. 11/21/13   Stacy Gardner, PA-C  losartan (COZAAR) 100 MG tablet TAKE 1 TABLET (100 MG TOTAL) BY MOUTH DAILY. 02/22/14   Janith Lima, MD  losartan (COZAAR) 100 MG tablet TAKE 1 TABLET (100 MG TOTAL) BY MOUTH DAILY. 02/22/14   Janith Lima, MD  omeprazole  (PRILOSEC) 40 MG capsule TAKE 1 CAPSULE BY MOUTH TWICE A DAY 09/02/13   Janith Lima, MD  oseltamivir (TAMIFLU) 75 MG capsule Take 1 capsule (75 mg total) by mouth 2 (two) times daily. 11/24/13   Webb Silversmith, NP  verapamil (CALAN-SR) 120 MG CR tablet TAKE ONE CAPSULE BY MOUTH EVERY DAY 02/22/14   Janith Lima, MD  verapamil (CALAN-SR) 120 MG CR tablet TAKE ONE CAPSULE BY MOUTH EVERY DAY 02/22/14   Janith Lima, MD   BP 127/98  Pulse 95  Temp(Src) 98.1 F (36.7 C) (Oral)  Resp 20  Ht 6\' 5"  (1.956 m)  Wt 397 lb 8 oz (180.305 kg)  BMI 47.13 kg/m2  SpO2 96% Physical Exam  Constitutional: He is oriented to person, place, and time. He appears well-developed and well-nourished. No distress.  HENT:  Head: Normocephalic and atraumatic.  Right Ear: Hearing normal.  Left Ear: Hearing normal.  Nose: Nose normal.  Mouth/Throat: Oropharynx is clear and moist and mucous membranes are normal.  Eyes: Conjunctivae and EOM are normal. Pupils are equal, round, and reactive to light.  Neck: Normal range of motion. Neck supple.  Cardiovascular: Regular rhythm, S1 normal and S2 normal.  Exam reveals no gallop and no friction rub.   No murmur heard. Pulmonary/Chest: Effort normal and breath sounds normal. No respiratory distress. He exhibits no tenderness.  Abdominal: Soft. Normal appearance and bowel sounds are normal. There is no hepatosplenomegaly. There is no tenderness. There is no rebound, no guarding, no tenderness at McBurney's point and negative Murphy's sign. No hernia.  Musculoskeletal: Normal range of motion.  Neurological: He is alert and oriented to person, place, and time. He has normal strength. No cranial nerve deficit or sensory deficit. Coordination normal. GCS eye subscore is 4. GCS verbal subscore is 5. GCS motor subscore is 6.  Skin: Skin is warm, dry and intact. No rash noted. No cyanosis.  Psychiatric: He has a normal mood and affect. His speech is normal and behavior is normal.  Thought content normal.    ED Course  Procedures (including critical care time) Labs Review Labs Reviewed  CBC - Abnormal; Notable for the following:    WBC 12.5 (*)    All other components within normal limits  BASIC METABOLIC PANEL  Randolm Idol, ED    Imaging Review Dg Chest 2 View  03/06/2014   CLINICAL DATA:  28 year old male left side chest pain. Initial encounter.  EXAM: CHEST  2 VIEW  COMPARISON:  11/21/2013 and earlier.  FINDINGS: Stable and normal lung volumes. Normal cardiac size and mediastinal contours. Visualized tracheal air column is within normal limits. The lungs are clear. No pneumothorax or effusion. No acute osseous  abnormality identified.  IMPRESSION: Negative, no acute cardiopulmonary abnormality.   Electronically Signed   By: Lars Pinks M.D.   On: 03/06/2014 18:45     EKG Interpretation None      MDM   Final diagnoses:  Asthma  Bronchitis    Patient presents to the ER for evaluation of chest pain. He does have cardiac risk factors in that he has a history of early heart disease in his family, as hyperlipidemia and hypertension. Symptoms are atypical, however. They occurred at rest over an conjunction with palpitations which are likely secondary to decongestant use. Did review the EKG from urgent care, no acute findings were present. Troponin was negative. Patient's chest x-ray was also clear. Patient was reassured, will initiate treatment for her asthma and bronchitis, followup with primary doctor.    Orpah Greek, MD 03/08/14 [phone]

## 2014-03-06 NOTE — Discharge Instructions (Signed)
Asthma, Adult Asthma is a recurring condition in which the airways tighten and narrow. Asthma can make it difficult to breathe. It can cause coughing, wheezing, and shortness of breath. Asthma episodes (also called asthma attacks) range from minor to life-threatening. Asthma cannot be cured, but medicines and lifestyle changes can help control it. CAUSES Asthma is believed to be caused by inherited (genetic) and environmental factors, but its exact cause is unknown. Asthma may be triggered by allergens, lung infections, or irritants in the air. Asthma triggers are different for each person. Common triggers include:   Animal dander.  Dust mites.  Cockroaches.  Pollen from trees or grass.  Mold.  Smoke.  Air pollutants such as dust, household cleaners, hair sprays, aerosol sprays, paint fumes, strong chemicals, or strong odors.  Cold air, weather changes, and winds (which increase molds and pollens in the air).  Strong emotional expressions such as crying or laughing hard.  Stress.  Certain medicines (such as aspirin) or types of drugs (such as beta-blockers).  Sulfites in foods and drinks. Foods and drinks that may contain sulfites include dried fruit, potato chips, and sparkling grape juice.  Infections or inflammatory conditions such as the flu, a cold, or an inflammation of the nasal membranes (rhinitis).  Gastroesophageal reflux disease (GERD).  Exercise or strenuous activity. SYMPTOMS Symptoms may occur immediately after asthma is triggered or many hours later. Symptoms include:  Wheezing.  Excessive nighttime or early morning coughing.  Frequent or severe coughing with a common cold.  Chest tightness.  Shortness of breath. DIAGNOSIS  The diagnosis of asthma is made by a review of your medical history and a physical exam. Tests may also be performed. These may include:  Lung function studies. These tests show how much air you breath in and out.  Allergy  tests.  Imaging tests such as X-rays. TREATMENT  Asthma cannot be cured, but it can usually be controlled. Treatment involves identifying and avoiding your asthma triggers. It also involves medicines. There are 2 classes of medicine used for asthma treatment:   Controller medicines. These prevent asthma symptoms from occurring. They are usually taken every day.  Reliever or rescue medicines. These quickly relieve asthma symptoms. They are used as needed and provide short-term relief. Your health care provider will help you create an asthma action plan. An asthma action plan is a written plan for managing and treating your asthma attacks. It includes a list of your asthma triggers and how they may be avoided. It also includes information on when medicines should be taken and when their dosage should be changed. An action plan may also involve the use of a device called a peak flow meter. A peak flow meter measures how well the lungs are working. It helps you monitor your condition. HOME CARE INSTRUCTIONS   Take medicine as directed by your health care provider. Speak with your health care provider if you have questions about how or when to take the medicines.  Use a peak flow meter as directed by your health care provider. Record and keep track of readings.  Understand and use the action plan to help minimize or stop an asthma attack without needing to seek medical care.  Control your home environment in the following ways to help prevent asthma attacks:  Do not smoke. Avoid being exposed to secondhand smoke.  Change your heating and air conditioning filter regularly.  Limit your use of fireplaces and wood stoves.  Get rid of pests (such as roaches and  mice) and their droppings.  Throw away plants if you see mold on them.  Clean your floors and dust regularly. Use unscented cleaning products.  Try to have someone else vacuum for you regularly. Stay out of rooms while they are being  vacuumed and for a short while afterward. If you vacuum, use a dust mask from a hardware store, a double-layered or microfilter vacuum cleaner bag, or a vacuum cleaner with a HEPA filter.  Replace carpet with wood, tile, or vinyl flooring. Carpet can trap dander and dust.  Use allergy-proof pillows, mattress covers, and box spring covers.  Wash bed sheets and blankets every week in hot water and dry them in a dryer.  Use blankets that are made of polyester or cotton.  Clean bathrooms and kitchens with bleach. If possible, have someone repaint the walls in these rooms with mold-resistant paint. Keep out of the rooms that are being cleaned and painted.  Wash hands frequently. SEEK MEDICAL CARE IF:   You have wheezing, shortness of breath, or a cough even if taking medicine to prevent attacks.  The colored mucus you cough up (sputum) is thicker than usual.  Your sputum changes from clear or white to yellow, green, gray, or bloody.  You have any problems that may be related to the medicines you are taking (such as a rash, itching, swelling, or trouble breathing).  You are using a reliever medicine more than 2 3 times per week.  Your peak flow is still at 50 79% of you personal best after following your action plan for 1 hour. SEEK IMMEDIATE MEDICAL CARE IF:   You seem to be getting worse and are unresponsive to treatment during an asthma attack.  You are short of breath even at rest.  You get short of breath when doing very little physical activity.  You have difficulty eating, drinking, or talking due to asthma symptoms.  You develop chest pain.  You develop a fast heartbeat.  You have a bluish color to your lips or fingernails.  You are lightheaded, dizzy, or faint.  Your peak flow is less than 50% of your personal best.  You have a fever or persistent symptoms for more than 2 3 days.  You have a fever and symptoms suddenly get worse. MAKE SURE YOU:   Understand these  instructions.  Will watch your condition.  Will get help right away if you are not doing well or get worse. Document Released: 09/28/2005 Document Revised: 05/31/2013 Document Reviewed: 04/27/2013 [facility] Patient Information 2014 [place], [place].  Bronchitis Bronchitis is inflammation of the airways that extend from the windpipe into the lungs (bronchi). The inflammation often causes mucus to develop, which leads to a cough. If the inflammation becomes severe, it may cause shortness of breath. CAUSES  Bronchitis may be caused by:   Viral infections.   Bacteria.   Cigarette smoke.   Allergens, pollutants, and other irritants.  SIGNS AND SYMPTOMS  The most common symptom of bronchitis is a frequent cough that produces mucus. Other symptoms include:  Fever.   Body aches.   Chest congestion.   Chills.   Shortness of breath.   Sore throat.  DIAGNOSIS  Bronchitis is usually diagnosed through a medical history and physical exam. Tests, such as chest X-rays, are sometimes done to rule out other conditions.  TREATMENT  You may need to avoid contact with whatever caused the problem (smoking, for example). Medicines are sometimes needed. These may include:  Antibiotics. These may be prescribed  if the condition is caused by bacteria.  Cough suppressants. These may be prescribed for relief of cough symptoms.   Inhaled medicines. These may be prescribed to help open your airways and make it easier for you to breathe.   Steroid medicines. These may be prescribed for those with recurrent (chronic) bronchitis. HOME CARE INSTRUCTIONS  Get plenty of rest.   Drink enough fluids to keep your urine clear or pale yellow (unless you have a medical condition that requires fluid restriction). Increasing fluids may help thin your secretions and will prevent dehydration.   Only take over-the-counter or prescription medicines as directed by your health care provider.  Only  take antibiotics as directed. Make sure you finish them even if you start to feel better.  Avoid secondhand smoke, irritating chemicals, and strong fumes. These will make bronchitis worse. If you are a smoker, quit smoking. Consider using nicotine gum or skin patches to help control withdrawal symptoms. Quitting smoking will help your lungs heal faster.   Put a cool-mist humidifier in your bedroom at night to moisten the air. This may help loosen mucus. Change the water in the humidifier daily. You can also run the hot water in your shower and sit in the bathroom with the door closed for 5 10 minutes.   Follow up with your health care provider as directed.   Wash your hands frequently to avoid catching bronchitis again or spreading an infection to others.  SEEK MEDICAL CARE IF: Your symptoms do not improve after 1 week of treatment.  SEEK IMMEDIATE MEDICAL CARE IF:  Your fever increases.  You have chills.   You have chest pain.   You have worsening shortness of breath.   You have bloody sputum.  You faint.  You have lightheadedness.  You have a severe headache.   You vomit repeatedly. MAKE SURE YOU:   Understand these instructions.  Will watch your condition.  Will get help right away if you are not doing well or get worse. Document Released: 09/28/2005 Document Revised: 07/19/2013 Document Reviewed: 05/23/2013 [facility] Patient Information 2014 [place].

## 2014-03-06 NOTE — ED Notes (Signed)
Pt  Reports  Chest  Pain         Nasal  stuffyness       And  Sinus     Congestion          With    A  Non  Productive    Cough  With   Symptoms  That  Started  Today        Pt  Has  Been  Using  otc  Cough  meds   Without  releif              Pt  States  Pain  Scale  Is  4           And  He  Has  A  strong  History  Of  Heart  Disease  In family

## 2014-03-06 NOTE — ED Notes (Signed)
Following approval from Dr Betsey Holiday, EKG from [facility] printed and reviewed.

## 2014-03-06 NOTE — ED Notes (Signed)
Dr. Pollina at bedside   

## 2014-03-09 NOTE — ED Provider Notes (Signed)
Medical screening examination/treatment/procedure(s) were performed by resident physician or non-physician practitioner and as supervising physician I was immediately available for consultation/collaboration.   Pauline Good MD.   Billy Fischer, MD 03/09/14 [phone]

## 2014-03-16 ENCOUNTER — Other Ambulatory Visit: Payer: Self-pay | Admitting: Internal Medicine

## 2014-04-02 ENCOUNTER — Other Ambulatory Visit: Payer: Self-pay | Admitting: Internal Medicine

## 2014-06-23 ENCOUNTER — Other Ambulatory Visit: Payer: Self-pay | Admitting: Internal Medicine

## 2014-07-06 ENCOUNTER — Ambulatory Visit (INDEPENDENT_AMBULATORY_CARE_PROVIDER_SITE_OTHER): Payer: BC Managed Care – PPO | Admitting: Internal Medicine

## 2014-07-06 ENCOUNTER — Ambulatory Visit (INDEPENDENT_AMBULATORY_CARE_PROVIDER_SITE_OTHER)
Admission: RE | Admit: 2014-07-06 | Discharge: 2014-07-06 | Disposition: A | Discharge status: Home / Self Care | Payer: BC Managed Care – PPO | Source: Ambulatory Visit | Attending: Internal Medicine | Admitting: Internal Medicine

## 2014-07-06 ENCOUNTER — Ambulatory Visit: Payer: Self-pay | Admitting: Internal Medicine

## 2014-07-06 ENCOUNTER — Encounter: Payer: Self-pay | Admitting: Internal Medicine

## 2014-07-06 VITALS — BP 124/84 | HR 96 | Temp 98.7°F | Resp 16 | Ht 77.0 in | Wt >= 6400 oz

## 2014-07-06 DIAGNOSIS — M8949 Other hypertrophic osteoarthropathy, multiple sites: Secondary | ICD-10-CM | POA: Insufficient documentation

## 2014-07-06 DIAGNOSIS — E785 Hyperlipidemia, unspecified: Secondary | ICD-10-CM

## 2014-07-06 DIAGNOSIS — J4531 Mild persistent asthma with (acute) exacerbation: Secondary | ICD-10-CM

## 2014-07-06 DIAGNOSIS — J45901 Unspecified asthma with (acute) exacerbation: Secondary | ICD-10-CM

## 2014-07-06 DIAGNOSIS — R059 Cough, unspecified: Secondary | ICD-10-CM

## 2014-07-06 DIAGNOSIS — K209 Esophagitis, unspecified without bleeding: Secondary | ICD-10-CM

## 2014-07-06 DIAGNOSIS — J13 Pneumonia due to Streptococcus pneumoniae: Secondary | ICD-10-CM

## 2014-07-06 DIAGNOSIS — F3289 Other specified depressive episodes: Secondary | ICD-10-CM

## 2014-07-06 DIAGNOSIS — R058 Other specified cough: Secondary | ICD-10-CM | POA: Insufficient documentation

## 2014-07-06 DIAGNOSIS — K589 Irritable bowel syndrome without diarrhea: Secondary | ICD-10-CM

## 2014-07-06 DIAGNOSIS — F329 Major depressive disorder, single episode, unspecified: Secondary | ICD-10-CM

## 2014-07-06 DIAGNOSIS — I10 Essential (primary) hypertension: Secondary | ICD-10-CM

## 2014-07-06 DIAGNOSIS — R05 Cough: Secondary | ICD-10-CM

## 2014-07-06 DIAGNOSIS — F32A Depression, unspecified: Secondary | ICD-10-CM

## 2014-07-06 DIAGNOSIS — J111 Influenza due to unidentified influenza virus with other respiratory manifestations: Secondary | ICD-10-CM

## 2014-07-06 DIAGNOSIS — M150 Primary generalized (osteo)arthritis: Secondary | ICD-10-CM

## 2014-07-06 DIAGNOSIS — M159 Polyosteoarthritis, unspecified: Secondary | ICD-10-CM | POA: Insufficient documentation

## 2014-07-06 MED ORDER — ALBUTEROL SULFATE HFA 108 (90 BASE) MCG/ACT IN AERS: 2.0000 | INHALATION_SPRAY | Freq: Four times a day (QID) | RESPIRATORY_TRACT | Status: DC | PRN

## 2014-07-06 MED ORDER — MOXIFLOXACIN HCL 400 MG PO TABS: 400.0000 mg | ORAL_TABLET | Freq: Every day | ORAL | Status: AC

## 2014-07-06 MED ORDER — OMEPRAZOLE 40 MG PO CPDR: DELAYED_RELEASE_CAPSULE | ORAL | Status: DC

## 2014-07-06 MED ORDER — VERAPAMIL HCL ER 120 MG PO TBCR: EXTENDED_RELEASE_TABLET | ORAL | Status: DC

## 2014-07-06 MED ORDER — LOSARTAN POTASSIUM 100 MG PO TABS: ORAL_TABLET | ORAL | Status: DC

## 2014-07-06 MED ORDER — FLUTICASONE FUROATE-VILANTEROL 200-25 MCG/INH IN AEPB: 1.0000 | INHALATION_SPRAY | Freq: Every day | RESPIRATORY_TRACT | Status: DC

## 2014-07-06 MED ORDER — ALPRAZOLAM 1 MG PO TABS: 1.0000 mg | ORAL_TABLET | Freq: Three times a day (TID) | ORAL | Status: DC | PRN

## 2014-07-06 MED ORDER — CELECOXIB 200 MG PO CAPS: ORAL_CAPSULE | ORAL | Status: DC

## 2014-07-06 NOTE — Assessment & Plan Note (Signed)
Cont xanax as needed 

## 2014-07-06 NOTE — Assessment & Plan Note (Signed)
He has persistent symptoms Will start [facility]

## 2014-07-06 NOTE — Assessment & Plan Note (Signed)
His BP is well controlled 

## 2014-07-06 NOTE — Assessment & Plan Note (Signed)
His CXR is normal 

## 2014-07-06 NOTE — Progress Notes (Signed)
Subjective:    Patient ID: Alexis [name], male    DOB: 1986-05-03, 28 y.o.   MRN: [id number]  Cough This is a chronic problem. The current episode started more than 1 year ago. The problem has been gradually worsening. The cough is productive of purulent sputum (he has been producing yellow/green phlegm over the last week). Associated symptoms include wheezing. Pertinent negatives include no chest pain, chills, ear congestion, ear pain, fever, headaches, heartburn, hemoptysis, myalgias, nasal congestion, postnasal drip, rash, rhinorrhea, sore throat, shortness of breath, sweats or weight loss. Nothing aggravates the symptoms. He has tried OTC cough suppressant and a beta-agonist inhaler for the symptoms. The treatment provided mild relief. His past medical history is significant for asthma. There is no history of bronchiectasis, COPD, emphysema, environmental allergies or pneumonia.      Review of Systems  Constitutional: Negative.  Negative for fever, chills, weight loss, diaphoresis, appetite change and fatigue.  HENT: Negative for congestion, ear pain, nosebleeds, postnasal drip, rhinorrhea, sinus pressure, sneezing, sore throat and trouble swallowing.   Eyes: Negative.   Respiratory: Positive for cough and wheezing. Negative for hemoptysis and shortness of breath.   Cardiovascular: Negative.  Negative for chest pain, palpitations and leg swelling.  Gastrointestinal: Negative.  Negative for heartburn, nausea, vomiting, abdominal pain, diarrhea, constipation and blood in stool.  Endocrine: Negative.   Genitourinary: Negative.   Musculoskeletal: Negative.  Negative for arthralgias, back pain, joint swelling, myalgias and neck pain.  Skin: Negative.  Negative for color change, pallor, rash and wound.  Allergic/Immunologic: Negative.  Negative for environmental allergies.  Neurological: Negative.  Negative for headaches.  Hematological: Negative.  Negative for adenopathy. Does not bruise/bleed  easily.  Psychiatric/Behavioral: Positive for sleep disturbance. Negative for suicidal ideas, hallucinations, behavioral problems, confusion, self-injury, dysphoric mood, decreased concentration and agitation. The patient is nervous/anxious. The patient is not hyperactive.        Objective:   Physical Exam  Vitals reviewed. Constitutional: He is oriented to person, place, and time. He appears well-developed and well-nourished.  Non-toxic appearance. He does not have a sickly appearance. He does not appear ill. No distress.  HENT:  Head: Normocephalic and atraumatic.  Mouth/Throat: Oropharynx is clear and moist. No oropharyngeal exudate.  Eyes: Conjunctivae are normal. Right eye exhibits no discharge. Left eye exhibits no discharge. No scleral icterus.  Neck: Normal range of motion. Neck supple. No JVD present. No tracheal deviation present. No thyromegaly present.  Cardiovascular: Normal rate, regular rhythm, normal heart sounds and intact distal pulses.  Exam reveals no gallop and no friction rub.   No murmur heard. Pulmonary/Chest: Effort normal. No accessory muscle usage or stridor. Not tachypneic. No respiratory distress. He has no decreased breath sounds. He has no wheezes. He has no rhonchi. He has no rales. He exhibits no tenderness.  Abdominal: Soft. Bowel sounds are normal. He exhibits no distension and no mass. There is no tenderness. There is no rebound and no guarding.  Musculoskeletal: Normal range of motion. He exhibits no edema and no tenderness.  Lymphadenopathy:    He has no cervical adenopathy.  Neurological: He is oriented to person, place, and time.  Skin: Skin is warm and dry. No rash noted. He is not diaphoretic. No erythema. No pallor.  Psychiatric: He has a normal mood and affect. His speech is normal and behavior is normal. Judgment and thought content normal. His mood appears not anxious. His affect is not angry. He is not agitated and not aggressive. Cognition and  memory are normal. He does not exhibit a depressed mood. He expresses no homicidal and no suicidal ideation. He expresses no suicidal plans and no homicidal plans.     Lab Results  Component Value Date   WBC 12.5* 03/06/2014   HGB 14.4 03/06/2014   HCT 42.1 03/06/2014   PLT 257 03/06/2014   GLUCOSE 88 03/06/2014   CHOL 188 04/25/2012   TRIG 97.0 04/25/2012   HDL 42.90 04/25/2012   LDLCALC 126* 04/25/2012   ALT 38 05/08/2013   AST 26 05/08/2013   NA 141 03/06/2014   K 4.1 03/06/2014   CL 102 03/06/2014   CREATININE 0.98 03/06/2014   BUN 12 03/06/2014   CO2 27 03/06/2014   TSH 1.99 05/08/2013   HGBA1C 5.5 03/08/2007        Assessment & Plan:

## 2014-07-06 NOTE — Assessment & Plan Note (Signed)
Will cont the PPI for this

## 2014-07-06 NOTE — Patient Instructions (Signed)
Cough, Adult  A cough is a reflex that helps clear your throat and airways. It can help heal the body or may be a reaction to an irritated airway. A cough may only last 2 or 3 weeks (acute) or may last more than 8 weeks (chronic).  CAUSES Acute cough:  Viral or bacterial infections. Chronic cough:  Infections.  Allergies.  Asthma.  Post-nasal drip.  Smoking.  Heartburn or acid reflux.  Some medicines.  Chronic lung problems (COPD).  Cancer. SYMPTOMS   Cough.  Fever.  Chest pain.  Increased breathing rate.  High-pitched whistling sound when breathing (wheezing).  Colored mucus that you cough up (sputum). TREATMENT   A bacterial cough may be treated with antibiotic medicine.  A viral cough must run its course and will not respond to antibiotics.  Your caregiver may recommend other treatments if you have a chronic cough. HOME CARE INSTRUCTIONS   Only take over-the-counter or prescription medicines for pain, discomfort, or fever as directed by your caregiver. Use cough suppressants only as directed by your caregiver.  Use a cold steam vaporizer or humidifier in your bedroom or home to help loosen secretions.  Sleep in a semi-upright position if your cough is worse at night.  Rest as needed.  Stop smoking if you smoke. SEEK IMMEDIATE MEDICAL CARE IF:   You have pus in your sputum.  Your cough starts to worsen.  You cannot control your cough with suppressants and are losing sleep.  You begin coughing up blood.  You have difficulty breathing.  You develop pain which is getting worse or is uncontrolled with medicine.  You have a fever. MAKE SURE YOU:   Understand these instructions.  Will watch your condition.  Will get help right away if you are not doing well or get worse. Document Released: 03/27/2011 Document Revised: 12/21/2011 Document Reviewed: 03/27/2011 ExitCare Patient Information 2015 [employer]. This information is not intended  to replace advice given to you by your health care provider. Make sure you discuss any questions you have with your health care provider.  

## 2014-07-06 NOTE — Assessment & Plan Note (Signed)
Will treat with avelox

## 2014-07-06 NOTE — Progress Notes (Signed)
Pre visit review using our clinic review tool, if applicable. No additional management support is needed unless otherwise documented below in the visit note. 

## 2014-07-27 ENCOUNTER — Other Ambulatory Visit: Payer: Self-pay

## 2014-10-10 ENCOUNTER — Ambulatory Visit
Admission: RE | Admit: 2014-10-10 | Discharge: 2014-10-10 | Disposition: A | Discharge status: Home / Self Care | Payer: BC Managed Care – PPO | Source: Ambulatory Visit | Attending: Otolaryngology | Admitting: Otolaryngology

## 2014-10-10 ENCOUNTER — Other Ambulatory Visit: Payer: Self-pay | Admitting: Otolaryngology

## 2014-10-10 DIAGNOSIS — R0789 Other chest pain: Secondary | ICD-10-CM

## 2014-10-10 DIAGNOSIS — R05 Cough: Secondary | ICD-10-CM

## 2014-10-10 DIAGNOSIS — R059 Cough, unspecified: Secondary | ICD-10-CM

## 2014-10-16 ENCOUNTER — Telehealth: Payer: Self-pay | Admitting: Internal Medicine

## 2014-10-16 NOTE — Telephone Encounter (Signed)
Spoke with patient-he states he wants to see MW again (former patient 2011) for his continual cough. Pt will be here 10-23-14 at 3:30pm .

## 2014-10-23 ENCOUNTER — Ambulatory Visit (INDEPENDENT_AMBULATORY_CARE_PROVIDER_SITE_OTHER): Payer: BLUE CROSS/BLUE SHIELD | Admitting: Internal Medicine

## 2014-10-23 ENCOUNTER — Encounter: Payer: Self-pay | Admitting: Internal Medicine

## 2014-10-23 VITALS — BP 122/84 | HR 93 | Temp 98.9°F | Ht 78.0 in | Wt >= 6400 oz

## 2014-10-23 DIAGNOSIS — I10 Essential (primary) hypertension: Secondary | ICD-10-CM | POA: Diagnosis not present

## 2014-10-23 DIAGNOSIS — R05 Cough: Secondary | ICD-10-CM | POA: Diagnosis not present

## 2014-10-23 DIAGNOSIS — R058 Other specified cough: Secondary | ICD-10-CM

## 2014-10-23 MED ORDER — PREDNISONE 10 MG PO TABS: ORAL_TABLET | ORAL | Status: DC

## 2014-10-23 MED ORDER — CEFDINIR 300 MG PO CAPS: 300.0000 mg | ORAL_CAPSULE | Freq: Two times a day (BID) | ORAL | Status: DC

## 2014-10-23 MED ORDER — NEBIVOLOL HCL 10 MG PO TABS: 10.0000 mg | ORAL_TABLET | Freq: Every day | ORAL | Status: DC

## 2014-10-23 NOTE — Progress Notes (Signed)
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 Asthma age 29  Neg for allergies per Dr Shirl Harris and prev w/u for cough in pulmonary clinic 2011 dx was probable uacs with waxing and waning cough since then seems better in summer and recurrent/severe since early Dec 2015 despite prilosec 40 mg bid cough evolved while on BREO and continued through the holidays and self referred back to pulmonary clinic 10/23/2014   10/23/2014 1st [facility] office visit/ Tambra Muller   Chief Complaint  Patient presents with  . Pulmonary Consult    Pt here to re establish. He c/o increased cough for the past 4-6 wks. Cough is prod with moderate green sputum, sometimes blood tinged.  Cough is esp worse in the am. He states also has some SOB when he has the cough.     cough worse first thing in am but doesn't usally wake him up prematurely, also freq at hs  Using prilosec bid but not ac  Sinus eval by Ernesto Rutherford > dry and red > saline rx no abx No better with saba  On cozaar for about the same time he's been coughing  Coughs so hard gags and looses breath but no vomiting or syncope  No obvious patterns in  day to day or daytime variabilty or assoc chronic cough or cp or chest tightness, subjective wheeze overt sinus or hb symptoms. No unusual exp hx or h/o childhood pna/ asthma or knowledge of premature birth.  Sleeping ok without typical  nocturnal   exacerbation  of respiratory  c/o's or need for noct saba. Also denies any obvious fluctuation of symptoms with weather or environmental changes or other aggravating or alleviating factors except as outlined above   Current Medications, Allergies, Complete Past Medical History, Past Surgical History, Family History, and Social History were reviewed in [employer] record.  ROS  The following are not active complaints unless  bolded sore throat, dysphagia, dental problems, itching, sneezing,  nasal congestion or excess/ purulent secretions, ear ache,   fever, chills, sweats, unintended wt loss, pleuritic or exertional cp, hemoptysis,  orthopnea pnd or leg swelling, presyncope, palpitations, heartburn, abdominal pain, anorexia, nausea, vomiting, diarrhea  or change in bowel or urinary habits, change in stools or urine, dysuria,hematuria,  rash, arthralgias, visual complaints, headache, numbness weakness or ataxia or problems with walking or coordination,  change in mood/affect or memory.       Review of Systems     Objective:   Physical Exam   amb wm nad  Wt Readings from Last 3 Encounters:  10/23/14 401 lb (181.892 kg)  07/06/14 404 lb (183.253 kg)  03/06/14 397 lb 8 oz (180.305 kg)    Vital signs reviewed/ P 93 on calan 120 qd noted    HEENT: nl dentition, turbinates, and orophanx. Nl external ear canals without cough reflex   NECK :  without JVD/Nodes/TM/ nl carotid upstrokes bilaterally   LUNGS: no acc muscle use, clear to A and P bilaterally without cough on insp or exp maneuvers   CV:  RRR  no s3 or murmur or increase in P2, no edema   ABD:  soft and nontender with nl excursion in the supine position. No bruits or organomegaly, bowel sounds nl  MS:  warm without deformities, calf tenderness, cyanosis or clubbing  SKIN:  warm and dry without lesions    NEURO:  alert, approp, no deficits    CXR:  10/10/14  I personally reviewed images and agree with radiology impression as follows:   No edema or consolidation.     Assessment:

## 2014-10-23 NOTE — Patient Instructions (Addendum)
Bystolic 10 mg one daily  Stop BREO and albuterol and cozaar  Prednisone 10 mg take  4 each am x 2 days,   2 each am x 2 days,  1 each am x 2 days and stop  omnicef 300 mg twice daily x 10days and if green mucus still there you need sinus ct   prilosec 40 mg Take 30- 60 min before your first and last meals of the day and pecpcid ac 20 mg at bedtime  GERD (REFLUX)  is an extremely common cause of respiratory symptoms just like yours , many times with no obvious heartburn at all.    It can be treated with medication, but also with lifestyle changes including avoidance of late meals, excessive alcohol, smoking cessation, and avoid fatty foods, chocolate, peppermint, colas, red wine, and acidic juices such as orange juice.  NO MINT OR MENTHOL PRODUCTS SO NO COUGH DROPS  USE SUGARLESS CANDY INSTEAD (Jolley ranchers or Stover's or Life Savers) or even ice chips will also do - the key is to swallow to prevent all throat clearing. NO OIL BASED VITAMINS - use powdered substitutes.   Please schedule a follow up office visit in 4 weeks, sooner if needed

## 2014-10-23 NOTE — Assessment & Plan Note (Addendum)
The most common causes of chronic cough in immunocompetent adults include the following: upper airway cough syndrome (UACS), previously referred to as postnasal drip syndrome (PNDS), which is caused by variety of rhinosinus conditions; (2) asthma; (3) GERD; (4) chronic bronchitis from cigarette smoking or other inhaled environmental irritants; (5) nonasthmatic eosinophilic bronchitis; and (6) bronchiectasis.   These conditions, singly or in combination, have accounted for up to 94% of the causes of chronic cough in prospective studies.   Other conditions have constituted no >6% of the causes in prospective studies These have included bronchogenic carcinoma, chronic interstitial pneumonia, sarcoidosis, left ventricular failure, ACEI-induced cough, and aspiration from a condition associated with pharyngeal dysfunction.    Chronic cough is often simultaneously caused by more than one condition. A single cause has been found from 38 to 82% of the time, multiple causes from 18 to 62%. Multiply caused cough has been the result of three diseases up to 42% of the time.       Based on hx and exam, this is most likely:  Classic Upper airway cough syndrome, so named because it's frequently impossible to sort out how much is  CR/sinusitis with freq throat clearing (which can be related to primary GERD)   vs  causing  secondary (" extra esophageal")  GERD from wide swings in gastric pressure that occur with throat clearing, often  promoting self use of mint and menthol lozenges that reduce the lower esophageal sphincter tone and exacerbate the problem further in a cyclical fashion.   These are the same pts (now being labeled as having "irritable larynx syndrome" by some cough centers) who not infrequently have a history of having failed to tolerate ace inhibitors and (even now losartan reported),  dry powder inhalers (like BREO) or biphosphonates or report having atypical reflux symptoms that don't respond to  standard doses of PPI , and are easily confused as having aecopd or asthma flares by even experienced allergists/ pulmonologists.   The first step is to maximize acid suppression and eliminate ARB, rx short course for ? Sinusitis  then regroup in 2weeks  with CT scan in interim if not better   See instructions for specific recommendations which were reviewed directly with the patient who was given a copy with highlighter outlining the key components.

## 2014-10-23 NOTE — Assessment & Plan Note (Signed)
For reasons that may related to vascular permability and nitric oxide pathways but not elevated  bradykinin levels (as seen with  ACEi use) losartan in the generic form has been reported now from mulitple sources  to cause a similar pattern of non-specific  upper airway symptoms as seen with acei.   This has not been reported with exposure to the other ARB's to date, so it seems reasonable for now to try either generic diovan or avapro if ARB needed or use an alternative class altogether.  See:  Lelon Frohlich Allergy Asthma Immunol  2008: 101: p [phone]    Will try bystolic samples at 10 mg per day noting that with pulse 93 should tol fine

## 2014-11-12 ENCOUNTER — Ambulatory Visit (INDEPENDENT_AMBULATORY_CARE_PROVIDER_SITE_OTHER): Payer: BLUE CROSS/BLUE SHIELD | Admitting: Nurse Practitioner

## 2014-11-12 ENCOUNTER — Other Ambulatory Visit: Payer: BLUE CROSS/BLUE SHIELD

## 2014-11-12 ENCOUNTER — Encounter: Payer: Self-pay | Admitting: Nurse Practitioner

## 2014-11-12 VITALS — BP 130/88 | HR 115 | Temp 100.1°F | Ht 77.0 in | Wt >= 6400 oz

## 2014-11-12 DIAGNOSIS — J0110 Acute frontal sinusitis, unspecified: Secondary | ICD-10-CM

## 2014-11-12 LAB — POCT RAPID STREP A (OFFICE): RAPID STREP A SCREEN: NEGATIVE

## 2014-11-12 LAB — POCT INFLUENZA A/B
INFLUENZA A, POC: NEGATIVE
Influenza B, POC: NEGATIVE

## 2014-11-12 MED ORDER — DOXYCYCLINE HYCLATE 100 MG PO TABS: 100.0000 mg | ORAL_TABLET | Freq: Two times a day (BID) | ORAL | Status: DC

## 2014-11-12 NOTE — Patient Instructions (Signed)
Start antibiotic. Eat yogurt daily to help prevent antibiotic -associated diarrhea  Start daily sinus rinses (Neilmed Sinus Rinse). Use for at least 1 week.  Use 1000 mg tylenol & 400 mg ibuprophen twice daily for headache. F/u 2 weeks or sooner if you feel worse.

## 2014-11-12 NOTE — Progress Notes (Signed)
Pre visit review using our clinic review tool, if applicable. No additional management support is needed unless otherwise documented below in the visit note. 

## 2014-11-13 ENCOUNTER — Emergency Department (HOSPITAL_COMMUNITY): Payer: BLUE CROSS/BLUE SHIELD

## 2014-11-13 ENCOUNTER — Other Ambulatory Visit (HOSPITAL_COMMUNITY): Payer: Self-pay | Admitting: Otolaryngology

## 2014-11-13 ENCOUNTER — Ambulatory Visit (HOSPITAL_COMMUNITY)
Admission: RE | Admit: 2014-11-13 | Discharge: 2014-11-13 | Disposition: A | Discharge status: Home / Self Care | Payer: BLUE CROSS/BLUE SHIELD | Source: Ambulatory Visit | Attending: Otolaryngology | Admitting: Otolaryngology

## 2014-11-13 ENCOUNTER — Encounter (HOSPITAL_COMMUNITY): Payer: Self-pay | Admitting: Emergency Medicine

## 2014-11-13 ENCOUNTER — Emergency Department (HOSPITAL_COMMUNITY)
Admission: EM | Admit: 2014-11-13 | Discharge: 2014-11-13 | Disposition: A | Discharge status: Home / Self Care | Payer: BLUE CROSS/BLUE SHIELD | Attending: Emergency Medicine | Admitting: Emergency Medicine

## 2014-11-13 DIAGNOSIS — R51 Headache: Secondary | ICD-10-CM | POA: Insufficient documentation

## 2014-11-13 DIAGNOSIS — Z792 Long term (current) use of antibiotics: Secondary | ICD-10-CM | POA: Insufficient documentation

## 2014-11-13 DIAGNOSIS — G44001 Cluster headache syndrome, unspecified, intractable: Secondary | ICD-10-CM

## 2014-11-13 DIAGNOSIS — J323 Chronic sphenoidal sinusitis: Secondary | ICD-10-CM | POA: Insufficient documentation

## 2014-11-13 DIAGNOSIS — J45909 Unspecified asthma, uncomplicated: Secondary | ICD-10-CM | POA: Diagnosis not present

## 2014-11-13 DIAGNOSIS — J322 Chronic ethmoidal sinusitis: Secondary | ICD-10-CM

## 2014-11-13 DIAGNOSIS — Z85828 Personal history of other malignant neoplasm of skin: Secondary | ICD-10-CM | POA: Insufficient documentation

## 2014-11-13 DIAGNOSIS — Z880 Allergy status to penicillin: Secondary | ICD-10-CM | POA: Insufficient documentation

## 2014-11-13 DIAGNOSIS — Z7952 Long term (current) use of systemic steroids: Secondary | ICD-10-CM | POA: Diagnosis not present

## 2014-11-13 DIAGNOSIS — F329 Major depressive disorder, single episode, unspecified: Secondary | ICD-10-CM | POA: Diagnosis not present

## 2014-11-13 DIAGNOSIS — R519 Headache, unspecified: Secondary | ICD-10-CM

## 2014-11-13 DIAGNOSIS — K219 Gastro-esophageal reflux disease without esophagitis: Secondary | ICD-10-CM | POA: Diagnosis not present

## 2014-11-13 DIAGNOSIS — I10 Essential (primary) hypertension: Secondary | ICD-10-CM | POA: Diagnosis not present

## 2014-11-13 DIAGNOSIS — Z79899 Other long term (current) drug therapy: Secondary | ICD-10-CM | POA: Insufficient documentation

## 2014-11-13 DIAGNOSIS — J019 Acute sinusitis, unspecified: Secondary | ICD-10-CM | POA: Diagnosis not present

## 2014-11-13 LAB — CREATININE, SERUM
Creatinine, Ser: 0.91 mg/dL (ref 0.50–1.35)
GFR calc Af Amer: >90 mL/min (ref >90)
GFR calc non Af Amer: >90 mL/min (ref >90)

## 2014-11-13 MED ORDER — KETOROLAC TROMETHAMINE 30 MG/ML IJ SOLN
30.0000 mg | Freq: Once | INTRAMUSCULAR | Status: AC
  Administered 2014-11-13: 30 mg via INTRAVENOUS
  Filled 2014-11-13: qty 1

## 2014-11-13 MED ORDER — MORPHINE SULFATE 4 MG/ML IJ SOLN
4.0000 mg | Freq: Once | INTRAMUSCULAR | Status: AC
  Administered 2014-11-13: 4 mg via INTRAVENOUS
  Filled 2014-11-13: qty 1

## 2014-11-13 MED ORDER — METOCLOPRAMIDE HCL 5 MG/ML IJ SOLN
10.0000 mg | Freq: Once | INTRAMUSCULAR | Status: AC
  Administered 2014-11-13: 10 mg via INTRAVENOUS
  Filled 2014-11-13: qty 2

## 2014-11-13 NOTE — ED Notes (Signed)
Pt here for HA x 5 days that is worst in his life with pain into neck; pt sts some photophobia

## 2014-11-13 NOTE — ED Provider Notes (Signed)
CSN: [id number]     Arrival date & time 11/13/14  1246 History   First MD Initiated Contact with Patient 11/13/14 1257     Chief Complaint  Patient presents with  . Headache      HPI Patient reports ongoing headache over the past 4-5 days it seems to be more focused on the left side of his head.  He is a history of ocular migraines but reports these are normally well controlled.  He also reports some upper respiratory symptoms with chills.  No documented fever.  He was seen by his primary care physician today who recommended antibiotics for possible sinusitis but also is concerned about his headache and ordered an MRI and MRA of his head.  The patient presented to radiology radiology did not think that the MRI was a reasonable study given his presentation and thus he was sent to the ER for evaluation.  He denies neck pain or neck stiffness.  No change in his vision.   Past Medical History  Diagnosis Date  . ALLERGIC RHINITIS 06/10/2009  . ADD 09/13/2007  . ASTHMA, UNSPECIFIED, UNSPECIFIED STATUS 07/19/2008  . DEPRESSIVE DISORDER 09/13/2007  . DYSMETABOLIC SYNDROME 0/93/8182  . GERD 09/13/2007  . HYPERLIPIDEMIA 10/10/2009  . HYPERTENSION 09/13/2007  . IBS 03/20/2009  . Morbid obesity 07/19/2008  . Skin cancer   . Acid reflux    Past Surgical History  Procedure Laterality Date  . Appendectomy  2004  . Ankle surgery  2007  . Skin cancer excision      teenager   Family History  Problem Relation Age of Onset  . Hypertension Father   . Emphysema Maternal Grandmother     smoker  . Allergies      whole family-both sides of family  . Asthma Mother   . Asthma Paternal Grandmother   . Asthma Maternal Grandmother   . Heart disease      MGM deceased with MI; both sides of grandparents  . Clotting disorder Mother     Factor 5  . Clotting disorder Maternal Uncle     Factor 5  . Rheum arthritis Mother   . Skin cancer Mother   . Lung cancer Maternal Grandmother   . Diabetes Father      type 2   . Diabetes Paternal Grandmother     type 2   History  Substance Use Topics  . Smoking status: Never Smoker   . Smokeless tobacco: Never Used  . Alcohol Use: No     Comment: socially    Review of Systems  All other systems reviewed and are negative.     Allergies  Citrus; Amoxicillin-pot clavulanate; and Sulfamethoxazole-trimethoprim  Home Medications   Prior to Admission medications   Medication Sig Start Date End Date Taking? Authorizing Provider  ALPRAZolam Duanne Moron) 1 MG tablet Take 1 tablet (1 mg total) by mouth 3 (three) times daily as needed. For anxiety 07/06/14  Yes Janith Lima, MD  celecoxib (CELEBREX) 200 MG capsule TAKE ONE CAPSULE BY MOUTH EVERY DAY Patient taking differently: Take 200 mg by mouth daily as needed for mild pain. TAKE ONE CAPSULE BY MOUTH EVERY DAY 07/06/14  Yes Janith Lima, MD  doxycycline (VIBRA-TABS) 100 MG tablet Take 1 tablet (100 mg total) by mouth 2 (two) times daily. 11/12/14  Yes Irene Pap, NP  losartan (COZAAR) 100 MG tablet Take 100 mg by mouth daily.   Yes Historical Provider, MD  Nutritional Supplements (JUICE PLUS FIBRE PO) Take  1 capsule by mouth daily.   Yes Historical Provider, MD  omeprazole (PRILOSEC) 40 MG capsule TAKE 1 CAPSULE BY MOUTH TWICE A DAY 07/06/14  Yes Janith Lima, MD  verapamil (CALAN-SR) 120 MG CR tablet TAKE ONE CAPSULE BY MOUTH EVERY DAY 07/06/14  Yes Janith Lima, MD  EPINEPHrine (EPI-PEN) 0.3 mg/0.3 mL DEVI Inject 0.3 mLs (0.3 mg total) into the muscle once. 02/23/13   Deneise Lever, MD  nebivolol (BYSTOLIC) 10 MG tablet Take 1 tablet (10 mg total) by mouth daily. 10/23/14   Tanda Rockers, MD  predniSONE (DELTASONE) 10 MG tablet Take  4 each am x 2 days,   2 each am x 2 days,  1 each am x 2 days and stop 10/23/14   Tanda Rockers, MD   BP 117/82 mmHg  Pulse 95  Temp(Src) 98.6 F (37 C) (Oral)  Resp 18  Ht 6\' 6"  (1.981 m)  Wt 405 lb (183.707 kg)  BMI 46.81 kg/m2  SpO2 95% Physical Exam   Constitutional: He is oriented to person, place, and time. He appears well-developed and well-nourished.  HENT:  Head: Normocephalic and atraumatic.  Eyes: EOM are normal. Pupils are equal, round, and reactive to light.  Neck: Normal range of motion.  Cardiovascular: Normal rate, regular rhythm, normal heart sounds and intact distal pulses.   Pulmonary/Chest: Effort normal and breath sounds normal. No respiratory distress.  Abdominal: Soft. He exhibits no distension. There is no tenderness.  Musculoskeletal: Normal range of motion.  Neurological: He is alert and oriented to person, place, and time.  5/5 strength in major muscle groups of  bilateral upper and lower extremities. Speech normal. No facial asymetry.   Skin: Skin is warm and dry.  Psychiatric: He has a normal mood and affect. Judgment normal.  Nursing note and vitals reviewed.   ED Course  Procedures (including critical care time) Labs Review Labs Reviewed - No data to display  Imaging Review Ct Head Wo Contrast  11/13/2014   CLINICAL DATA:  Seven onset left parieto-occipital headache 5 days prior, photophobia  EXAM: CT HEAD WITHOUT CONTRAST  TECHNIQUE: Contiguous axial images were obtained from the base of the skull through the vertex without intravenous contrast.  COMPARISON:  Head CT March 17, 2007; brain MRI May 11, 2012  FINDINGS: The ventricles are normal in size and configuration. There is no mass, hemorrhage, extra-axial fluid collection, or midline shift. Gray and white compartments appear normal. No acute infarct apparent.  The bony calvarium appears intact. The mastoid air cells are clear. No intraorbital lesions are appreciable on this study. There is an air-fluid level in the left sphenoid sinus. There is a sebaceous cyst in the soft tissues over the medial left parietal bone.  IMPRESSION: Evidence of air-fluid level in the left sphenoid sinus with extensive opacification in this sinus. No intracranial mass, hemorrhage,  or focal gray -white compartment lesion/acute appearing infarct. Sebaceous cyst overlying the medial left parietal bone measuring 1.4 x 1.0 cm.   Electronically Signed   By: Lowella Grip M.D.   On: 11/13/2014 14:01  I personally reviewed the imaging tests through PACS system I reviewed available ER/hospitalization records through the EMR    EKG Interpretation None      MDM   Final diagnoses:  Headache, unspecified headache type  Acute sinusitis, recurrence not specified, unspecified location    Overall the patient is well-appearing.  His vital signs have normalized.  He was treated with a migraine cocktail  which is significantly improved his headache.  Head CT demonstrates no acute intracranial pathology but is reported possible sinusitis.  Patient reports that he was given a prescription by his primary care physician today for clindamycin.  He will fill this.  He feels much better would like to go home.    Hoy Morn, MD 11/13/14 1539

## 2014-11-13 NOTE — ED Notes (Signed)
Pt comfortable with discharge and follow up instructions. Pt declines wheelchair, escorted to waiting area by this RN. No prescriptions. 

## 2014-11-14 ENCOUNTER — Telehealth: Payer: Self-pay | Admitting: Nurse Practitioner

## 2014-11-14 ENCOUNTER — Encounter: Payer: Self-pay | Admitting: Neurology

## 2014-11-14 ENCOUNTER — Ambulatory Visit (INDEPENDENT_AMBULATORY_CARE_PROVIDER_SITE_OTHER): Payer: BLUE CROSS/BLUE SHIELD | Admitting: *Deleted

## 2014-11-14 ENCOUNTER — Ambulatory Visit (INDEPENDENT_AMBULATORY_CARE_PROVIDER_SITE_OTHER): Payer: BLUE CROSS/BLUE SHIELD | Admitting: Neurology

## 2014-11-14 VITALS — Ht 77.0 in | Wt >= 6400 oz

## 2014-11-14 DIAGNOSIS — G4452 New daily persistent headache (NDPH): Secondary | ICD-10-CM

## 2014-11-14 DIAGNOSIS — G43909 Migraine, unspecified, not intractable, without status migrainosus: Secondary | ICD-10-CM

## 2014-11-14 DIAGNOSIS — H9312 Tinnitus, left ear: Secondary | ICD-10-CM

## 2014-11-14 LAB — CULTURE, UPPER RESPIRATORY

## 2014-11-14 MED ORDER — KETOROLAC TROMETHAMINE 30 MG/ML IJ SOLN
30.0000 mg | Freq: Once | INTRAMUSCULAR | Status: AC
  Administered 2014-11-14: 30 mg via INTRAVENOUS

## 2014-11-14 MED ORDER — VALPROATE SODIUM 500 MG/5ML IV SOLN
1000.0000 mg | INTRAVENOUS | Status: DC
  Administered 2014-11-14: 1000 mg via INTRAVENOUS

## 2014-11-14 MED ORDER — METHYLPREDNISOLONE 4 MG PO KIT: PACK | ORAL | Status: DC

## 2014-11-14 MED ORDER — INDOMETHACIN ER 75 MG PO CPCR: 75.0000 mg | ORAL_CAPSULE | Freq: Two times a day (BID) | ORAL | Status: DC

## 2014-11-14 MED ORDER — PROCHLORPERAZINE EDISYLATE 5 MG/ML IJ SOLN
10.0000 mg | Freq: Once | INTRAMUSCULAR | Status: AC
  Administered 2014-11-14: 10 mg via INTRAVENOUS

## 2014-11-14 MED ORDER — SODIUM CHLORIDE 0.9 % IV SOLN
500.0000 mg | INTRAVENOUS | Status: DC
  Administered 2014-11-14: 500 mg via INTRAVENOUS

## 2014-11-14 NOTE — Telephone Encounter (Signed)
Throat culture pos for strep. Continue ABX as directed.

## 2014-11-14 NOTE — Patient Instructions (Signed)
Overall you are doing fairly well but I do want to suggest a few things today:   Remember to drink plenty of fluid, eat healthy meals and do not skip any meals. Try to eat protein with a every meal and eat a healthy snack such as fruit or nuts in between meals. Try to keep a regular sleep-wake schedule and try to exercise daily, particularly in the form of walking, 20-30 minutes a day, if you can.   As far as your medications are concerned, I would like to suggest: Indomethacin 75mg  twice daily with food stop for GI upset or dark stools, Medrol dosepak  As far as diagnostic testing: MRI of the brain, MRA of the head  I would like to see you back in 3 months, sooner if we need to. Please call us with any interim questions, concerns, problems, updates or refill requests.   Please also call us for any test results so we can go over those with you on the phone.  My clinical assistant and will answer any of your questions and relay your messages to me and also relay most of my messages to you.   Our phone number is [phone]. We also have an after hours call service for urgent matters and there is a physician on-call for urgent questions. For any emergencies you know to call 911 or go to the nearest emergency room

## 2014-11-14 NOTE — Progress Notes (Signed)
[facility]    Provider:  Dr Jaynee Eagles Referring Provider: Janith Lima, MD Primary Care Physician:  Scarlette Calico, MD  CC:  Headache  HPI:  Alexis [name] is a 29 y.o. male here as a referral from Dr. Ronnald Ramp for headache  Patient is here as an add on for new onset severe headache. PMHx of occular migraines. He is on 120mg  verapamil and completely controlled. Friday night they were at the theatre. The most he laughed the pressure was building in his head. It is on the left side. Throbbing is constant. He also feels stabbing in the brain in the temporal area. He has constant pain Extremely painful. The pain was so bad that his BP shot up when he was on telemetry in the ED. The pain is constant but then he has periods of elevated pain in the head, the pain stabs and holds for a minute. The worst pain he has ever felt in his life. Severe 10/10. Eye waters, keeps getting hot flashes, clammy feeling. +light sensitivity. It has been continuous. No weakness, vision. Has some pulsatile hearing changes. He has been sick recently and has a sore throat and a sinus infection.  Reviewed notes, labs and imaging from outside physicians, which showed: MRI brain w/wo contrast 04/2012 showed no acute intracranial abnormalities including mass lesion or mass effect, hydrocephalus, extra-axial fluid collection, midline shift, hemorrhage, or acute infarction, large ischemic events (personally reviewed images). BMP/CMP unremarkable.     Review of Systems: Patient complains of symptoms per HPI as well as the following symptoms: fevers, chills, eye pain, snoring, flushing headache, feeling hot, depression, anxiety. Pertinent negatives per HPI. All others negative.   History   Social History  . Marital Status: Single    Spouse Name: N/A    Number of Children: 0  . 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 Smoking status:  Never Smoker   . Smokeless tobacco: Never Used  . Alcohol Use: No     Comment: socially  . Drug Use: No  . Sexual Activity: Not Currently   Other Topics Concern  . Not on file   Social History Narrative   Lives at home with parents.   Works at [employer].   Single.   Caffeine use: 3-4 cups daily.        Family History  Problem Relation Age of Onset  . Hypertension Father   . Diabetes Father     type 2   . Emphysema Maternal Grandmother     smoker  . Asthma Maternal Grandmother   . Lung cancer Maternal Grandmother   . Allergies      whole family-both sides of family  . Asthma Mother   . Clotting disorder Mother     Factor 5  . Rheum arthritis Mother   . Skin cancer Mother   . Asthma Paternal Grandmother   . Diabetes Paternal Grandmother     type 2  . Heart disease      MGM deceased with MI; both sides of grandparents  . Clotting disorder Maternal Uncle     Factor 5    Past Medical History  Diagnosis Date  . ALLERGIC RHINITIS 06/10/2009  . ADD 09/13/2007  . ASTHMA, UNSPECIFIED, UNSPECIFIED STATUS 07/19/2008  . DEPRESSIVE DISORDER 09/13/2007  . DYSMETABOLIC SYNDROME 11/21/4707  . GERD 09/13/2007  . HYPERLIPIDEMIA 10/10/2009  . HYPERTENSION 09/13/2007  .  IBS 03/20/2009  . Morbid obesity 07/19/2008  . Skin cancer   . Acid reflux     Past Surgical History  Procedure Laterality Date  . Appendectomy  2005  . Ankle surgery  2007  . Skin cancer excision      teenager    Current Outpatient Prescriptions  Medication Sig Dispense Refill  . ALPRAZolam (XANAX) 1 MG tablet Take 1 tablet (1 mg total) by mouth 3 (three) times daily as needed. For anxiety 65 tablet 3  . buPROPion (WELLBUTRIN) 75 MG tablet Take 75 mg by mouth daily.  3  . celecoxib (CELEBREX) 200 MG capsule TAKE ONE CAPSULE BY MOUTH EVERY DAY (Patient taking differently: Take 200 mg by mouth daily as needed for mild pain. TAKE ONE CAPSULE BY MOUTH EVERY DAY) 90 capsule 3  . EPINEPHrine (EPI-PEN) 0.3 mg/0.3  mL DEVI Inject 0.3 mLs (0.3 mg total) into the muscle once. 1 Device prn  . losartan (COZAAR) 100 MG tablet Take 100 mg by mouth daily.    . Nutritional Supplements (JUICE PLUS FIBRE PO) Take 1 capsule by mouth daily.    Marland Kitchen omeprazole (PRILOSEC) 40 MG capsule TAKE 1 CAPSULE BY MOUTH TWICE A DAY 180 capsule 3  . PROAIR HFA 108 (90 BASE) MCG/ACT inhaler 2 puffs as needed.  3  . verapamil (CALAN-SR) 120 MG CR tablet TAKE ONE CAPSULE BY MOUTH EVERY DAY 90 tablet 3  . doxycycline (VIBRA-TABS) 100 MG tablet Take 1 tablet (100 mg total) by mouth 2 (two) times daily. (Patient not taking: Reported on 11/14/2014) 14 tablet 0  . indomethacin (INDOCIN SR) 75 MG CR capsule Take 1 capsule (75 mg total) by mouth 2 (two) times daily with a meal. 60 capsule 6  . methylPREDNISolone (MEDROL DOSEPAK) 4 MG tablet follow package directions 21 tablet 0  . nebivolol (BYSTOLIC) 10 MG tablet Take 1 tablet (10 mg total) by mouth daily. (Patient not taking: Reported on 11/14/2014)    . predniSONE (DELTASONE) 10 MG tablet Take  4 each am x 2 days,   2 each am x 2 days,  1 each am x 2 days and stop (Patient not taking: Reported on 11/14/2014) 14 tablet 0   Current Facility-Administered Medications  Medication Dose Route Frequency Provider Last Rate Last Dose  . methylPREDNISolone sodium succinate (SOLU-MEDROL) 500 mg in sodium chloride 0.9 % 100 mL IVPB  500 mg Intravenous Continuous Melvenia Beam, MD   Stopped at 11/14/14 1640  . valproate (DEPACON) 1,000 mg in sodium chloride 0.9 % 100 mL IVPB  1,000 mg Intravenous Continuous Melvenia Beam, MD 110 mL/hr at 11/14/14 1530 1,000 mg at 11/14/14 1530    Allergies as of 11/14/2014 - Review Complete 11/14/2014  Allergen Reaction Noted  . Citrus Anaphylaxis and Other (See Comments) 09/04/2012  . Amoxicillin-pot clavulanate    . Sulfamethoxazole-trimethoprim      Vitals: Ht 6\' 5"  (1.956 m)  Wt 404 lb (183.253 kg)  BMI 47.90 kg/m2 Last Weight:  Wt Readings from Last 1  Encounters:  11/14/14 404 lb (183.253 kg)   Last Height:   Ht Readings from Last 1 Encounters:  11/14/14 6\' 5"  (1.956 m)   Physical exam: Exam: Gen: NAD, conversant, well nourised, obese, well groomed                     CV: RRR, no MRG. No Carotid Bruits. No peripheral edema, warm, nontender Eyes: Conjunctivae clear without exudates or hemorrhage  Neuro: Detailed Neurologic  Exam  Speech:    Speech is normal; fluent and spontaneous with normal comprehension.  Cognition:    The patient is oriented to person, place, and time;     recent and remote memory intact;     language fluent;     normal attention, concentration,     fund of knowledge Cranial Nerves:    The pupils are equal, round, and reactive to light. The fundi are normal and spontaneous venous pulsations are present. Visual fields are full to finger confrontation. Extraocular movements are intact. Trigeminal sensation is intact and the muscles of mastication are normal. The face is symmetric. The palate elevates in the midline. Hearing intact. Voice is normal. Shoulder shrug is normal. The tongue has normal motion without fasciculations.   Coordination:    Normal finger to nose and heel to shin. Normal rapid alternating movements.   Gait:    Heel-toe and tandem gait are normal.   Motor Observation:    No asymmetry, no atrophy, and no involuntary movements noted. Tone:    Normal muscle tone.    Posture:    Posture is normal. normal erect    Strength:    Strength is V/V in the upper and lower limbs.      Sensation: intact to LT     Reflex Exam:  DTR's:    Deep tendon reflexes in the upper and lower extremities are symmetric bilaterally.   Toes:    The toes are downgoing bilaterally.   Clonus:    Clonus is absent.     Assessment/Plan:  29 year old male with a PMHx of obesity, migraines with new-onset severe daily persistent headache in the setting of fever, cough, sore throat. He is on antibiotics. Will  give migraine cocktail today to see if that helps. Will also give indocin and steroid taper. Continue Verapamil for headaches. Will order an MRI of the brain and MRA of the head. Follow up 3 months. Continue antibiotic regimen until complete.  Sarina Ill, MD  [facility] [address] [place], [place] [address]  Phone [phone] Fax [phone]

## 2014-11-14 NOTE — Progress Notes (Signed)
Patient here for appointment. Dr. Jaynee Eagles ordered depacon infusion, toradol, compazine and solumedrol. Headache rated at "9/10" at start of infusion and patient rated it at "2/10" after infusion completed. Dr. Jaynee Eagles with patient after infusion. Assessment level of headache decreased. No apparent distress. Patient was with mother.

## 2014-11-15 NOTE — Progress Notes (Signed)
   Subjective:    Patient ID: Alexis [name], male    DOB: 12/01/1985, 29 y.o.   MRN: [id number]  HPI Comments: Pt is accompanied by mother. He states he began to feel pressure L side of head 3 days ago while at a movie-he was laughing hard & noticed Pressure behind R eye. Yesterday he developed fever, nasal congestion. Advil relieves fever, but not HA. He was seen by Dr Melvyn Novas 2 weeks ago & was prescribed prednisone & omnicef for sinus infection, which he thought may be contributing to upper airway cough syndrome. Pt states he did not start omnicef due to cost, but started prednisone. He has had some relief in nasal congestion.   Fever  This is a new problem. The current episode started yesterday. The problem occurs constantly. The problem has been unchanged. His temperature was unmeasured prior to arrival. Associated symptoms include congestion, coughing (worse w/laying down), headaches (described as sharp & L side of head, comes & goes, lasts few seconds) and a sore throat. Pertinent negatives include no abdominal pain, chest pain, diarrhea, ear pain, nausea, rash, vomiting or wheezing. Treatments tried: 2 tabs advil, prednisone. The treatment provided no relief.      Review of Systems  Constitutional: Positive for fever.  HENT: Positive for congestion, postnasal drip and sore throat. Negative for ear pain and facial swelling.   Eyes: Positive for pain (L, pain behing L eye). Negative for photophobia, redness and visual disturbance.  Respiratory: Positive for cough (worse w/laying down). Negative for wheezing.   Cardiovascular: Negative for chest pain.  Gastrointestinal: Negative for nausea, vomiting, abdominal pain and diarrhea.  Musculoskeletal: Negative for neck pain and neck stiffness.  Skin: Negative for rash.  Neurological: Positive for headaches (described as sharp & L side of head, comes & goes, lasts few seconds). Negative for dizziness, facial asymmetry, light-headedness and numbness.         Objective:   Physical Exam  Constitutional: He is oriented to person, place, and time. He appears well-developed and well-nourished. He appears distressed (paused for 4 seconds while talking due to episode of "sharp" L sided head pain).  Obese  HENT:  Head: Normocephalic and atraumatic.  Right Ear: External ear normal.  Left Ear: External ear normal.  Mouth/Throat: Oropharynx is clear and moist. No oropharyngeal exudate.  Eyes: Conjunctivae are normal. Right eye exhibits no discharge. Left eye exhibits no discharge.  Neck: Normal range of motion. Neck supple. No thyromegaly present.  Cardiovascular: Normal rate, regular rhythm and normal heart sounds.   No murmur heard. Pulmonary/Chest: Effort normal and breath sounds normal. No respiratory distress. He has no wheezes. He has no rales.  Lymphadenopathy:    He has no cervical adenopathy.  Neurological: He is alert and oriented to person, place, and time. No cranial nerve deficit. Coordination normal.  Normal gait  Skin: Skin is warm and dry.  Psychiatric: He has a normal mood and affect. His behavior is normal. Thought content normal.  Vitals reviewed.         Assessment & Plan:  1. Acute frontal sinusitis, recurrence not specified Fever, HA, cough DD: flu, strep throat, other viral syndrome, sinusitis - doxycycline (VIBRA-TABS) 100 MG tablet; Take 1 tablet (100 mg total) by mouth 2 (two) times daily. (Patient not taking: Reported on 11/14/2014)  Dispense: 14 tablet; Refill: 0 Start sinus rinse - POCT rapid strep A- NEG - POCT Influenza A/B-NEG - Upper Respiratory Culture-Pending  See pt instructions F/u 2 weeks

## 2014-11-15 NOTE — Telephone Encounter (Signed)
Patient notified of results. Patient expressed understanding. Patient wanted to let you know that he went to ER 11/14/14 with severe HA and they sent him to neurology. Patient said that you can read report in his Epic chart.

## 2014-11-17 DIAGNOSIS — R519 Headache, unspecified: Secondary | ICD-10-CM | POA: Insufficient documentation

## 2014-11-17 DIAGNOSIS — R51 Headache: Secondary | ICD-10-CM

## 2014-11-19 ENCOUNTER — Ambulatory Visit (HOSPITAL_COMMUNITY)
Admission: RE | Admit: 2014-11-19 | Discharge: 2014-11-19 | Disposition: A | Discharge status: Home / Self Care | Payer: BLUE CROSS/BLUE SHIELD | Source: Ambulatory Visit | Attending: Otolaryngology | Admitting: Otolaryngology

## 2014-11-19 DIAGNOSIS — J323 Chronic sphenoidal sinusitis: Secondary | ICD-10-CM | POA: Insufficient documentation

## 2014-11-19 DIAGNOSIS — R509 Fever, unspecified: Secondary | ICD-10-CM | POA: Insufficient documentation

## 2014-11-19 DIAGNOSIS — J322 Chronic ethmoidal sinusitis: Secondary | ICD-10-CM | POA: Insufficient documentation

## 2014-11-19 DIAGNOSIS — G44001 Cluster headache syndrome, unspecified, intractable: Secondary | ICD-10-CM | POA: Diagnosis not present

## 2014-11-19 MED ORDER — GADOBENATE DIMEGLUMINE 529 MG/ML IV SOLN
20.0000 mL | Freq: Once | INTRAVENOUS | Status: AC | PRN
  Administered 2014-11-19: 20 mL via INTRAVENOUS

## 2014-11-26 ENCOUNTER — Ambulatory Visit: Payer: Self-pay | Admitting: Internal Medicine

## 2015-01-02 ENCOUNTER — Encounter: Payer: Self-pay | Admitting: Internal Medicine

## 2015-02-01 ENCOUNTER — Other Ambulatory Visit: Payer: Self-pay | Admitting: Internal Medicine

## 2015-02-25 ENCOUNTER — Ambulatory Visit: Payer: BLUE CROSS/BLUE SHIELD | Admitting: Neurology

## 2015-02-27 ENCOUNTER — Ambulatory Visit (INDEPENDENT_AMBULATORY_CARE_PROVIDER_SITE_OTHER): Payer: BLUE CROSS/BLUE SHIELD | Admitting: Neurology

## 2015-02-27 ENCOUNTER — Encounter: Payer: Self-pay | Admitting: Neurology

## 2015-02-27 VITALS — BP 151/91 | HR 104 | Temp 97.6°F | Ht 77.0 in | Wt >= 6400 oz

## 2015-02-27 DIAGNOSIS — G43109 Migraine with aura, not intractable, without status migrainosus: Secondary | ICD-10-CM

## 2015-02-27 MED ORDER — SUMATRIPTAN-NAPROXEN SODIUM 85-500 MG PO TABS: 1.0000 | ORAL_TABLET | ORAL | Status: DC | PRN

## 2015-02-27 MED ORDER — TOPIRAMATE ER 50 MG PO CAP24: 50.0000 mg | ORAL_CAPSULE | Freq: Every evening | ORAL | Status: DC

## 2015-02-27 NOTE — Progress Notes (Signed)
[facility]    Provider:  Dr Jaynee Eagles Referring Provider: Janith Lima, MD Primary Care Physician:  Scarlette Calico, MD  CC: Headache  Interval Update 02/27/2015:   Initial visit 11/14/2014 : Alexis [name] is a 29 y.o. male here as a referral from Dr. Ronnald Ramp for headache.   Migraines are well controlled if he takes the indomethacin. If he falls asleep and forgets the indomethacin on the couch he will wake up with a headache. He will wake up at 4am with a headache. He is on Verapamil and has not missed a dose. He does not have a lot of nausea with the migraines.  No signs of OSA.   He is on Wellbutrin. He says that he has been well on Wellbutrin for many years but he is unsure if it is helping him anymore. He has decreased energy and decreased concentration and he has gained weight and that makes him depressed.  Discussed that people with headaches often have decreased serotonin levels and that if location is not helping maybe we should change to an SSRI.    11/14/2014: Patient is here as an add on for new onset severe headache. PMHx of occular migraines. He is on 120mg  verapamil and completely controlled. Friday night they were at the theatre. The most he laughed the pressure was building in his head. It is on the left side. Throbbing is constant. He also feels stabbing in the brain in the temporal area. He has constant pain Extremely painful. The pain was so bad that his BP shot up when he was on telemetry in the ED. The pain is constant but then he has periods of elevated pain in the head, the pain stabs and holds for a minute. The worst pain he has ever felt in his life. Severe 10/10. Eye waters, keeps getting hot flashes, clammy feeling. +light sensitivity. It has been continuous. No weakness, vision. Has some pulsatile hearing changes. He has been sick recently and has a sore throat and a sinus infection.  Reviewed notes, labs and imaging from outside physicians, which  showed: MRI brain w/wo contrast 04/2012 showed no acute intracranial abnormalities including mass lesion or mass effect, hydrocephalus, extra-axial fluid collection, midline shift, hemorrhage, or acute infarction, large ischemic events (personally reviewed images). BMP/CMP unremarkable.   Review of Systems: Patient complains of symptoms per HPI as well as the following symptoms: Eye itching, eye redness, blurred vision, environmental allergies, headache, trouble swallowing, murmur, agitation, decreased concentration, depression, anxiety. Pertinent negatives per HPI. All others negative.   History   Social History  . Marital Status: Single    Spouse Name: N/A  . Number of Children: 0  . 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 Smokeless tobacco: Never Used  . Alcohol Use: No     Comment: socially  . Drug Use: No  . Sexual Activity: Not Currently   Other Topics Concern  . Not on file   Social History Narrative   Lives at home with parents.   Works at [employer].   Single.   Caffeine use: 3-4 cups daily.        Family History  Problem Relation Age of Onset  . Hypertension Father   . Diabetes Father     type 2   . Emphysema Maternal Grandmother     smoker  . Asthma Maternal Grandmother   .  Lung cancer Maternal Grandmother   . Allergies      whole family-both sides of family  . Asthma Mother   . Clotting disorder Mother     Factor 5  . Rheum arthritis Mother   . Skin cancer Mother   . Asthma Paternal Grandmother   . Diabetes Paternal Grandmother     type 2  . Heart disease      MGM deceased with MI; both sides of grandparents  . Clotting disorder Maternal Uncle     Factor 5    Past Medical History  Diagnosis Date  . ALLERGIC RHINITIS 06/10/2009  . ADD 09/13/2007  . ASTHMA, UNSPECIFIED, UNSPECIFIED STATUS 07/19/2008  . DEPRESSIVE DISORDER 09/13/2007  .  DYSMETABOLIC SYNDROME 0/34/7425  . GERD 09/13/2007  . HYPERLIPIDEMIA 10/10/2009  . HYPERTENSION 09/13/2007  . IBS 03/20/2009  . Morbid obesity 07/19/2008  . Skin cancer   . Acid reflux     Past Surgical History  Procedure Laterality Date  . Appendectomy  2005  . Ankle surgery  2007  . Skin cancer excision      teenager    Current Outpatient Prescriptions  Medication Sig Dispense Refill  . ALPRAZolam (XANAX) 1 MG tablet Take 1 tablet (1 mg total) by mouth 3 (three) times daily as needed. For anxiety 65 tablet 3  . buPROPion (WELLBUTRIN) 75 MG tablet Take 75 mg by mouth daily.  3  . buPROPion (WELLBUTRIN) 75 MG tablet TAKE 1 TABLET BY MOUTH DAILY 90 tablet 3  . celecoxib (CELEBREX) 200 MG capsule TAKE ONE CAPSULE BY MOUTH EVERY DAY (Patient taking differently: Take 200 mg by mouth daily as needed for mild pain. TAKE ONE CAPSULE BY MOUTH EVERY DAY) 90 capsule 3  . EPINEPHrine (EPI-PEN) 0.3 mg/0.3 mL DEVI Inject 0.3 mLs (0.3 mg total) into the muscle once. 1 Device prn  . indomethacin (INDOCIN SR) 75 MG CR capsule Take 1 capsule (75 mg total) by mouth 2 (two) times daily with a meal. 60 capsule 6  . losartan (COZAAR) 100 MG tablet Take 100 mg by mouth daily.    . methylPREDNISolone (MEDROL DOSEPAK) 4 MG tablet follow package directions 21 tablet 0  . Nutritional Supplements (JUICE PLUS FIBRE PO) Take 1 capsule by mouth daily.    Marland Kitchen omeprazole (PRILOSEC) 40 MG capsule TAKE 1 CAPSULE BY MOUTH TWICE A DAY 180 capsule 3  . PROAIR HFA 108 (90 BASE) MCG/ACT inhaler 2 puffs as needed.  3  . verapamil (CALAN-SR) 120 MG CR tablet TAKE ONE CAPSULE BY MOUTH EVERY DAY 90 tablet 3  . azithromycin (ZITHROMAX) 250 MG tablet Take 250 mg by mouth daily. 3-day course    . SUMAtriptan-naproxen (TREXIMET) 85-500 MG per tablet Take 1 tablet by mouth every 2 (two) hours as needed for migraine. 10 tablet 6  . Topiramate ER (TROKENDI XR) 50 MG CP24 Take 50 mg by mouth every evening. 30 capsule 6   Current  Facility-Administered Medications  Medication Dose Route Frequency Provider Last Rate Last Dose  . methylPREDNISolone sodium succinate (SOLU-MEDROL) 500 mg in sodium chloride 0.9 % 100 mL IVPB  500 mg Intravenous Continuous Melvenia Beam, MD   Stopped at 11/14/14 1640  . valproate (DEPACON) 1,000 mg in sodium chloride 0.9 % 100 mL IVPB  1,000 mg Intravenous Continuous Melvenia Beam, MD 110 mL/hr at 11/14/14 1530 1,000 mg at 11/14/14 1530    Allergies as of 02/27/2015 - Review Complete 02/27/2015  Allergen Reaction Noted  . Citrus Anaphylaxis  and Other (See Comments) 09/04/2012  . Amoxicillin-pot clavulanate    . Sulfamethoxazole-trimethoprim      Vitals: BP 151/91 mmHg  Pulse 104  Temp(Src) 97.6 F (36.4 C)  Ht 6\' 5"  (1.956 m)  Wt 412 lb 3.2 oz (186.973 kg)  BMI 48.87 kg/m2 Last Weight:  Wt Readings from Last 1 Encounters:  02/27/15 412 lb 3.2 oz (186.973 kg)   Last Height:   Ht Readings from Last 1 Encounters:  02/27/15 6\' 5"  (1.956 m)   Physical exam: Exam: Gen: NAD, conversant, well nourised, overweight, well groomed                     CV: RRR, no MRG. No Carotid Bruits. No peripheral edema, warm, nontender Eyes: Conjunctivae clear without exudates or hemorrhage  Neuro: Detailed Neurologic Exam  Speech:    Speech is normal; fluent and spontaneous with normal comprehension.  Cognition:    The patient is oriented to person, place, and time;     recent and remote memory intact;     language fluent;     normal attention, concentration,     fund of knowledge Cranial Nerves:    The pupils are equal, round, and reactive to light. The fundi are normal and spontaneous venous pulsations are present. Visual fields are full to finger confrontation. Extraocular movements are intact. Trigeminal sensation is intact and the muscles of mastication are normal. The face is symmetric. The palate elevates in the midline. Hearing intact. Voice is normal. Shoulder shrug is normal. The  tongue has normal motion without fasciculations.      Assessment/Plan:  This is a lovely 29 year old male with migraines taking verapamil and indomethacin. He reports anxiety and some mood issues and that the Wellbutrin is not helping him.  Treximet for abortive Start on Trokendi for migraine management, may be able to stop indomethacin. Wean indomethacin hopefully if Trokendi a improves migraines Can consider changing wellbutrin at next appointment. An SSRI may help better with patient's mood symptoms as well as migraines.  Start with Trokendi 50mg  at night. Increase as tolerated. Can consider decreasing other medications if this works Research scientist (life sciences) at onset of breakthrough migraine. Can repeat in 2 hours. Do not take more than 2 in one day or more than 2 days a week Would like to discuss change your wellbutrin at next appointment. Consider an SSRI such as Venlafaxine, Celexa, Zoloft. Can discuss.   Sarina Ill, MD  [facility] [address] [place], [place] [address]  Phone [phone] Fax [phone]  A total of 30 minutes was spent face-to-face with this patient. Over half this time was spent on counseling patient on the migraine diagnosis and different diagnostic and therapeutic options available.

## 2015-02-27 NOTE — Patient Instructions (Addendum)
Overall you are doing  well but I do want to suggest a few things today:   Remember to drink plenty of fluid, eat healthy meals and do not skip any meals. Try to eat protein with a every meal and eat a healthy snack such as fruit or nuts in between meals. Try to keep a regular sleep-wake schedule and try to exercise daily, particularly in the form of walking, 20-30 minutes a day, if you can.   As far as your medications are concerned, I would like to suggest:  Start with Trokendi 50mg  at night. Increase as tolerated. Can consider decreasing other medications if this works Research scientist (life sciences) or Relpax at onset of breakthrough migraine. Can repeat in 2 hours. Do not take more than 2 in one day or more than 2 days a week Would like to discuss change your wellbutrin at next appointment. Consider an SSRI such as Venlafaxine, Celexa, Zoloft. Can discuss.  I would like to see you back in 3 months, sooner if we need to. Please call us with any interim questions, concerns, problems, updates or refill requests.   Please also call us for any test results so we can go over those with you on the phone.  My clinical assistant and will answer any of your questions and relay your messages to me and also relay most of my messages to you.   Our phone number is [phone]. We also have an after hours call service for urgent matters and there is a physician on-call for urgent questions. For any emergencies you know to call 911 or go to the nearest emergency room

## 2015-02-28 ENCOUNTER — Telehealth: Payer: Self-pay | Admitting: *Deleted

## 2015-02-28 NOTE — Telephone Encounter (Signed)
Called patient to let him know the Trokendi samples are ready to be picked up at the front. 2-25mg  Trokendi XR and 2-50mg  Trokendi XR. I told him to bring a valid photo ID and he will have to sign that he picked up samples. Pt verbalized understanding.

## 2015-05-13 ENCOUNTER — Other Ambulatory Visit: Payer: Self-pay | Admitting: Neurology

## 2015-05-13 DIAGNOSIS — G43001 Migraine without aura, not intractable, with status migrainosus: Secondary | ICD-10-CM

## 2015-05-13 MED ORDER — TOPIRAMATE ER 100 MG PO CAP24: 100.0000 mg | ORAL_CAPSULE | Freq: Every day | ORAL | Status: DC

## 2015-05-15 ENCOUNTER — Telehealth: Payer: Self-pay

## 2015-05-15 NOTE — Telephone Encounter (Signed)
[place] has approved the request for coverage on Trokendi effective until 37/62/8315, or until policy changes or is terminated Ref # [id number] I called the patient to advise.  He is aware.  I called the pharmacy.  Spoke with [employer].  She tried to process the claim, but it did not go through yet.  She says that sometimes it make take a while for the auth to update in the system.

## 2015-05-15 NOTE — Telephone Encounter (Signed)
I spoke with patient again.  He talked with pharmacy and says the pharmacy told him the ins said we have not contacted them regarding this med, although we have an approval response, with Ref #.  I called the ins, however, they were already closed for the day.

## 2015-05-16 MED ORDER — TOPIRAMATE 50 MG PO TABS: 50.0000 mg | ORAL_TABLET | Freq: Two times a day (BID) | ORAL | Status: DC

## 2015-05-16 NOTE — Telephone Encounter (Signed)
I called the pharmacy back.  They reprocessed the claim and said the Rx did go through without any issues.  Says, however, they are getting a message from ins saying the patient is responsible for the entire cost of this drug because it is a tier 4 non-formulary drug.  She said with the contracted discount and the co-pay card from [employer], the amount the patient is responsible for is $335 per month.  I called the patient and relayed info provided by pharmacy.  Says the co-pay is just too much, and he would like to be changed to generic Topamax twice daily if provider is agreeable to this.    I spoke with Dr Jaynee Eagles, and she is agreeable to changing the patient to 50mg  bid.  Rx has been updated and sent.  I spoke with Alyse Low at the pharmacy who verified this drug is covered, with an $8 co-pay.  I called the patient back to advise.  Got no answer.  Left message.

## 2015-06-06 ENCOUNTER — Ambulatory Visit
Admission: RE | Admit: 2015-06-06 | Discharge: 2015-06-06 | Disposition: A | Discharge status: Home / Self Care | Payer: BLUE CROSS/BLUE SHIELD | Source: Ambulatory Visit | Attending: Otolaryngology | Admitting: Otolaryngology

## 2015-06-06 ENCOUNTER — Other Ambulatory Visit: Payer: Self-pay | Admitting: Otolaryngology

## 2015-06-06 DIAGNOSIS — J320 Chronic maxillary sinusitis: Secondary | ICD-10-CM

## 2015-07-09 ENCOUNTER — Other Ambulatory Visit: Payer: Self-pay

## 2015-07-09 DIAGNOSIS — I10 Essential (primary) hypertension: Secondary | ICD-10-CM

## 2015-07-09 MED ORDER — LOSARTAN POTASSIUM 100 MG PO TABS: 100.0000 mg | ORAL_TABLET | Freq: Every day | ORAL | Status: DC

## 2015-07-09 MED ORDER — VERAPAMIL HCL ER 120 MG PO TBCR
EXTENDED_RELEASE_TABLET | ORAL | Status: DC
Start: 2015-07-09 — End: 2015-08-09

## 2015-08-07 ENCOUNTER — Other Ambulatory Visit: Payer: Self-pay | Admitting: Internal Medicine

## 2015-08-08 ENCOUNTER — Other Ambulatory Visit: Payer: Self-pay | Admitting: Internal Medicine

## 2015-08-08 ENCOUNTER — Other Ambulatory Visit: Payer: Self-pay | Admitting: Neurology

## 2015-08-08 MED ORDER — LOSARTAN POTASSIUM 100 MG PO TABS: 100.0000 mg | ORAL_TABLET | Freq: Every day | ORAL | Status: DC

## 2015-08-08 NOTE — Addendum Note (Signed)
Addended by: Levonne Lapping on: 08/08/2015 01:35 PM   Modules accepted: Orders

## 2015-08-09 ENCOUNTER — Other Ambulatory Visit: Payer: Self-pay

## 2015-08-09 DIAGNOSIS — I10 Essential (primary) hypertension: Secondary | ICD-10-CM

## 2015-08-09 MED ORDER — VERAPAMIL HCL ER 120 MG PO TBCR: EXTENDED_RELEASE_TABLET | ORAL | Status: DC

## 2015-09-04 ENCOUNTER — Other Ambulatory Visit: Payer: Self-pay | Admitting: Geriatric Medicine

## 2015-09-04 MED ORDER — LOSARTAN POTASSIUM 100 MG PO TABS: 100.0000 mg | ORAL_TABLET | Freq: Every day | ORAL | Status: DC

## 2015-09-09 ENCOUNTER — Encounter: Payer: Self-pay | Admitting: Internal Medicine

## 2015-09-09 ENCOUNTER — Ambulatory Visit (INDEPENDENT_AMBULATORY_CARE_PROVIDER_SITE_OTHER): Payer: BLUE CROSS/BLUE SHIELD | Admitting: Internal Medicine

## 2015-09-09 VITALS — BP 138/96 | HR 90 | Temp 97.7°F | Resp 16 | Ht 77.0 in | Wt >= 6400 oz

## 2015-09-09 DIAGNOSIS — Z0000 Encounter for general adult medical examination without abnormal findings: Secondary | ICD-10-CM

## 2015-09-09 DIAGNOSIS — M8949 Other hypertrophic osteoarthropathy, multiple sites: Secondary | ICD-10-CM

## 2015-09-09 DIAGNOSIS — K209 Esophagitis, unspecified without bleeding: Secondary | ICD-10-CM

## 2015-09-09 DIAGNOSIS — I10 Essential (primary) hypertension: Secondary | ICD-10-CM

## 2015-09-09 DIAGNOSIS — K219 Gastro-esophageal reflux disease without esophagitis: Secondary | ICD-10-CM | POA: Diagnosis not present

## 2015-09-09 DIAGNOSIS — J0100 Acute maxillary sinusitis, unspecified: Secondary | ICD-10-CM

## 2015-09-09 DIAGNOSIS — M150 Primary generalized (osteo)arthritis: Secondary | ICD-10-CM

## 2015-09-09 DIAGNOSIS — M159 Polyosteoarthritis, unspecified: Secondary | ICD-10-CM

## 2015-09-09 MED ORDER — VERAPAMIL HCL ER 120 MG PO TBCR: EXTENDED_RELEASE_TABLET | ORAL | Status: DC

## 2015-09-09 MED ORDER — OMEPRAZOLE 40 MG PO CPDR: DELAYED_RELEASE_CAPSULE | ORAL | Status: DC

## 2015-09-09 MED ORDER — BUPROPION HCL 75 MG PO TABS: 75.0000 mg | ORAL_TABLET | Freq: Every day | ORAL | Status: DC

## 2015-09-09 MED ORDER — AZITHROMYCIN 500 MG PO TABS
500.0000 mg | ORAL_TABLET | Freq: Every day | ORAL | Status: DC
Start: 2015-09-09 — End: 2016-02-24

## 2015-09-09 MED ORDER — CELECOXIB 200 MG PO CAPS: ORAL_CAPSULE | ORAL | Status: DC

## 2015-09-09 MED ORDER — LOSARTAN POTASSIUM 100 MG PO TABS: 100.0000 mg | ORAL_TABLET | Freq: Every day | ORAL | Status: DC

## 2015-09-09 NOTE — Progress Notes (Signed)
Subjective:  Patient ID: Alexis [name], male    DOB: September 02, 1986  Age: 29 y.o. MRN: [id number]  CC: Sinusitis; Hypertension; and Annual Exam   HPI HENG GOLDNER presents for a CPX and BP check but he complains of a 5 day hx of thick yellow/green nasal phlegm, nasal congestion, ST, and productive cough.  Outpatient Prescriptions Prior to Visit  Medication Sig Dispense Refill  . EPINEPHrine (EPI-PEN) 0.3 mg/0.3 mL DEVI Inject 0.3 mLs (0.3 mg total) into the muscle once. 1 Device prn  . Nutritional Supplements (JUICE PLUS FIBRE PO) Take 1 capsule by mouth daily.    Marland Kitchen topiramate (TOPAMAX) 50 MG tablet Take 1 tablet (50 mg total) by mouth 2 (two) times daily. 60 tablet 11  . ALPRAZolam (XANAX) 1 MG tablet Take 1 tablet (1 mg total) by mouth 3 (three) times daily as needed. For anxiety 65 tablet 3  . azithromycin (ZITHROMAX) 250 MG tablet Take 250 mg by mouth daily. 3-day course    . buPROPion (WELLBUTRIN) 75 MG tablet Take 75 mg by mouth daily.  3  . buPROPion (WELLBUTRIN) 75 MG tablet TAKE 1 TABLET BY MOUTH DAILY 90 tablet 3  . celecoxib (CELEBREX) 200 MG capsule TAKE ONE CAPSULE BY MOUTH EVERY DAY (Patient taking differently: Take 200 mg by mouth daily as needed for mild pain. TAKE ONE CAPSULE BY MOUTH EVERY DAY) 90 capsule 3  . indomethacin (INDOCIN SR) 75 MG CR capsule TAKE 1 CAPSULE (75 MG TOTAL) BY MOUTH 2 (TWO) TIMES DAILY WITH A MEAL. 60 capsule 0  . losartan (COZAAR) 100 MG tablet Take 1 tablet (100 mg total) by mouth daily. 30 tablet 3  . methylPREDNISolone (MEDROL DOSEPAK) 4 MG tablet follow package directions 21 tablet 0  . omeprazole (PRILOSEC) 40 MG capsule TAKE 1 CAPSULE BY MOUTH TWICE A DAY 180 capsule 3  . PROAIR HFA 108 (90 BASE) MCG/ACT inhaler 2 puffs as needed.  3  . SUMAtriptan-naproxen (TREXIMET) 85-500 MG per tablet Take 1 tablet by mouth every 2 (two) hours as needed for migraine. 10 tablet 6  . verapamil (CALAN-SR) 120 MG CR tablet TAKE ONE CAPSULE BY MOUTH EVERY DAY 30  tablet 0   Facility-Administered Medications Prior to Visit  Medication Dose Route Frequency Provider Last Rate Last Dose  . valproate (DEPACON) 1,000 mg in sodium chloride 0.9 % 100 mL IVPB  1,000 mg Intravenous Continuous Melvenia Beam, MD 110 mL/hr at 11/14/14 1530 1,000 mg at 11/14/14 1530  . methylPREDNISolone sodium succinate (SOLU-MEDROL) 500 mg in sodium chloride 0.9 % 100 mL IVPB  500 mg Intravenous Continuous Melvenia Beam, MD   Stopped at 11/14/14 1640    ROS Review of Systems  Constitutional: Negative.  Negative for fever, chills, diaphoresis, activity change, appetite change, fatigue and unexpected weight change.  HENT: Positive for congestion, postnasal drip, rhinorrhea, sinus pressure and sore throat. Negative for facial swelling, sneezing, trouble swallowing and voice change.   Eyes: Negative.   Respiratory: Negative.  Negative for cough, choking, chest tightness, shortness of breath and stridor.   Cardiovascular: Negative.  Negative for chest pain, palpitations and leg swelling.  Gastrointestinal: Negative.  Negative for nausea, vomiting, abdominal pain, diarrhea and constipation.  Endocrine: Negative.   Genitourinary: Negative.  Negative for urgency, discharge, scrotal swelling, difficulty urinating, penile pain and testicular pain.  Musculoskeletal: Positive for arthralgias (knees). Negative for myalgias, back pain and neck pain.  Skin: Negative.  Negative for rash.  Allergic/Immunologic: Negative.   Neurological:  Negative.  Negative for dizziness, tremors, syncope, light-headedness, numbness and headaches.  Hematological: Negative.  Negative for adenopathy. Does not bruise/bleed easily.  Psychiatric/Behavioral: Negative.     Objective:  BP 138/96 mmHg  Pulse 90  Temp(Src) 97.7 F (36.5 C) (Oral)  Resp 16  Ht 6\' 5"  (1.956 m)  Wt 404 lb (183.253 kg)  BMI 47.90 kg/m2  SpO2 98%  BP Readings from Last 3 Encounters:  09/09/15 138/96  02/27/15 151/91  11/13/14  117/82    Wt Readings from Last 3 Encounters:  09/09/15 404 lb (183.253 kg)  02/27/15 412 lb 3.2 oz (186.973 kg)  11/14/14 404 lb (183.253 kg)    Physical Exam  Constitutional: He is oriented to person, place, and time. He appears well-developed and well-nourished.  Non-toxic appearance. He does not have a sickly appearance. He does not appear ill. No distress.  HENT:  Nose: Rhinorrhea present. No mucosal edema or sinus tenderness.  No foreign bodies. Right sinus exhibits maxillary sinus tenderness. Right sinus exhibits no frontal sinus tenderness. Left sinus exhibits no maxillary sinus tenderness and no frontal sinus tenderness.  Mouth/Throat: Oropharynx is clear and moist and mucous membranes are normal. Mucous membranes are not pale, not dry and not cyanotic. No oral lesions. No trismus in the jaw. No uvula swelling. No oropharyngeal exudate, posterior oropharyngeal edema, posterior oropharyngeal erythema or tonsillar abscesses.  Eyes: Conjunctivae are normal. Right eye exhibits no discharge. Left eye exhibits no discharge. No scleral icterus.  Neck: Normal range of motion. Neck supple. No JVD present. No tracheal deviation present. No thyromegaly present.  Cardiovascular: Normal rate, regular rhythm, normal heart sounds and intact distal pulses.  Exam reveals no gallop and no friction rub.   No murmur heard. Pulmonary/Chest: Effort normal and breath sounds normal. No stridor. No respiratory distress. He has no wheezes. He has no rales. He exhibits no tenderness.  Abdominal: Soft. Bowel sounds are normal. He exhibits no distension and no mass. There is no tenderness. There is no rebound and no guarding.  Musculoskeletal: Normal range of motion. He exhibits no edema or tenderness.  Lymphadenopathy:    He has no cervical adenopathy.  Neurological: He is oriented to person, place, and time.  Skin: Skin is warm and dry. No rash noted. He is not diaphoretic. No erythema. No pallor.    Psychiatric: He has a normal mood and affect. His behavior is normal. Judgment and thought content normal.  Vitals reviewed.   Lab Results  Component Value Date   WBC 12.5* 03/06/2014   HGB 14.4 03/06/2014   HCT 42.1 03/06/2014   PLT 257 03/06/2014   GLUCOSE 88 03/06/2014   CHOL 188 04/25/2012   TRIG 97.0 04/25/2012   HDL 42.90 04/25/2012   LDLCALC 126* 04/25/2012   ALT 38 05/08/2013   AST 26 05/08/2013   NA 141 03/06/2014   K 4.1 03/06/2014   CL 102 03/06/2014   CREATININE 0.91 11/13/2014   BUN 12 03/06/2014   CO2 27 03/06/2014   TSH 1.99 05/08/2013   HGBA1C 5.5 03/08/2007    Dg Sinuses Complete  06/06/2015  CLINICAL DATA:  Chronic right maxillary and ethmoid sinusitis, worse x1 week EXAM: PARANASAL SINUSES - COMPLETE 3 + VIEW COMPARISON:  CT head dated 11/13/2014 FINDINGS: Visualized paranasal sinuses are essentially clear. No air-fluid levels are present. No gross fracture is seen. IMPRESSION: Negative. Electronically Signed   By: Julian Hy M.D.   On: 06/06/2015 14:41    Assessment & Plan:   [place]  was seen today for sinusitis, hypertension and annual exam.  Diagnoses and all orders for this visit:  Essential hypertension- his BP is not adequately well controlled, will restart his prior meds, he agrees to work on his lifestyle modifications, will recheck his BP in 2-3 months -     verapamil (CALAN-SR) 120 MG CR tablet; TAKE ONE CAPSULE BY MOUTH EVERY DAY  Esophagitis -     omeprazole (PRILOSEC) 40 MG capsule; TAKE 1 CAPSULE BY MOUTH TWICE A DAY  Primary osteoarthritis involving multiple joints -     celecoxib (CELEBREX) 200 MG capsule; TAKE ONE CAPSULE BY MOUTH EVERY DAY  Routine general medical examination at a health care facility- exam done, labs ordered, vaccines were reviewed and updated, pt ed material was given -     Lipid panel; Future -     Comprehensive metabolic panel; Future -     CBC with Differential/Platelet; Future -     TSH;  Future  Gastroesophageal reflux disease without esophagitis- symptoms are well controlled on the PPI  Acute maxillary sinusitis, recurrence not specified -     azithromycin (ZITHROMAX) 500 MG tablet; Take 1 tablet (500 mg total) by mouth daily.  Other orders -     losartan (COZAAR) 100 MG tablet; Take 1 tablet (100 mg total) by mouth daily. -     buPROPion (WELLBUTRIN) 75 MG tablet; Take 1 tablet (75 mg total) by mouth daily.   I have discontinued Mr. Farrel's ALPRAZolam, PROAIR HFA, buPROPion, methylPREDNISolone, azithromycin, SUMAtriptan-naproxen, and indomethacin. I have also changed his buPROPion. Additionally, I am having him start on azithromycin. Lastly, I am having him maintain his EPINEPHrine, Nutritional Supplements (JUICE PLUS FIBRE PO), topiramate, losartan, verapamil, omeprazole, and celecoxib. We will stop administering methylPREDNISolone sodium succinate (SOLU-MEDROL) 500 mg in sodium chloride 0.9 % 100 mL IVPB. Additionally, we will continue to administer (valproate (DEPACON) 1,000 mg in sodium chloride 0.9 % 100 mL IVPB).  Meds ordered this encounter  Medications  . losartan (COZAAR) 100 MG tablet    Sig: Take 1 tablet (100 mg total) by mouth daily.    Dispense:  90 tablet    Refill:  2  . verapamil (CALAN-SR) 120 MG CR tablet    Sig: TAKE ONE CAPSULE BY MOUTH EVERY DAY    Dispense:  90 tablet    Refill:  2  . omeprazole (PRILOSEC) 40 MG capsule    Sig: TAKE 1 CAPSULE BY MOUTH TWICE A DAY    Dispense:  180 capsule    Refill:  3  . buPROPion (WELLBUTRIN) 75 MG tablet    Sig: Take 1 tablet (75 mg total) by mouth daily.    Dispense:  90 tablet    Refill:  3  . celecoxib (CELEBREX) 200 MG capsule    Sig: TAKE ONE CAPSULE BY MOUTH EVERY DAY    Dispense:  90 capsule    Refill:  3  . azithromycin (ZITHROMAX) 500 MG tablet    Sig: Take 1 tablet (500 mg total) by mouth daily.    Dispense:  3 tablet    Refill:  0     Follow-up: Return in about 3 months (around  12/10/2015).  Scarlette Calico, MD

## 2015-09-09 NOTE — Progress Notes (Signed)
Pre visit review using our clinic review tool, if applicable. No additional management support is needed unless otherwise documented below in the visit note. 

## 2015-09-09 NOTE — Patient Instructions (Signed)
Hypertension Hypertension, commonly called high blood pressure, is when the force of blood pumping through your arteries is too strong. Your arteries are the blood vessels that carry blood from your heart throughout your body. A blood pressure reading consists of a higher number over a lower number, such as 110/72. The higher number (systolic) is the pressure inside your arteries when your heart pumps. The lower number (diastolic) is the pressure inside your arteries when your heart relaxes. Ideally you want your blood pressure below 120/80. Hypertension forces your heart to work harder to pump blood. Your arteries may become narrow or stiff. Having untreated or uncontrolled hypertension can cause heart attack, stroke, kidney disease, and other problems. RISK FACTORS Some risk factors for high blood pressure are controllable. Others are not.  Risk factors you cannot control include:   Race. You may be at higher risk if you are African American.  Age. Risk increases with age.  Gender. Men are at higher risk than women before age 45 years. After age 65, women are at higher risk than men. Risk factors you can control include:  Not getting enough exercise or physical activity.  Being overweight.  Getting too much fat, sugar, calories, or salt in your diet.  Drinking too much alcohol. SIGNS AND SYMPTOMS Hypertension does not usually cause signs or symptoms. Extremely high blood pressure (hypertensive crisis) may cause headache, anxiety, shortness of breath, and nosebleed. DIAGNOSIS To check if you have hypertension, your health care provider will measure your blood pressure while you are seated, with your arm held at the level of your heart. It should be measured at least twice using the same arm. Certain conditions can cause a difference in blood pressure between your right and left arms. A blood pressure reading that is higher than normal on one occasion does not mean that you need treatment. If  it is not clear whether you have high blood pressure, you may be asked to return on a different day to have your blood pressure checked again. Or, you may be asked to monitor your blood pressure at home for 1 or more weeks. TREATMENT Treating high blood pressure includes making lifestyle changes and possibly taking medicine. Living a healthy lifestyle can help lower high blood pressure. You may need to change some of your habits. Lifestyle changes may include:  Following the DASH diet. This diet is high in fruits, vegetables, and whole grains. It is low in salt, red meat, and added sugars.  Keep your sodium intake below 2,300 mg per day.  Getting at least 30-45 minutes of aerobic exercise at least 4 times per week.  Losing weight if necessary.  Not smoking.  Limiting alcoholic beverages.  Learning ways to reduce stress. Your health care provider may prescribe medicine if lifestyle changes are not enough to get your blood pressure under control, and if one of the following is true:  You are 18-59 years of age and your systolic blood pressure is above 140.  You are 60 years of age or older, and your systolic blood pressure is above 150.  Your diastolic blood pressure is above 90.  You have diabetes, and your systolic blood pressure is over 140 or your diastolic blood pressure is over 90.  You have kidney disease and your blood pressure is above 140/90.  You have heart disease and your blood pressure is above 140/90. Your personal target blood pressure may vary depending on your medical conditions, your age, and other factors. HOME CARE INSTRUCTIONS    Have your blood pressure rechecked as directed by your health care provider.   Take medicines only as directed by your health care provider. Follow the directions carefully. Blood pressure medicines must be taken as prescribed. The medicine does not work as well when you skip doses. Skipping doses also puts you at risk for  problems.  Do not smoke.   Monitor your blood pressure at home as directed by your health care provider. SEEK MEDICAL CARE IF:   You think you are having a reaction to medicines taken.  You have recurrent headaches or feel dizzy.  You have swelling in your ankles.  You have trouble with your vision. SEEK IMMEDIATE MEDICAL CARE IF:  You develop a severe headache or confusion.  You have unusual weakness, numbness, or feel faint.  You have severe chest or abdominal pain.  You vomit repeatedly.  You have trouble breathing. MAKE SURE YOU:   Understand these instructions.  Will watch your condition.  Will get help right away if you are not doing well or get worse.   This information is not intended to replace advice given to you by your health care provider. Make sure you discuss any questions you have with your health care provider.   Document Released: 09/28/2005 Document Revised: 02/12/2015 Document Reviewed: 07/21/2013 Elsevier Interactive Patient Education 2016 [employer]  

## 2015-09-12 ENCOUNTER — Encounter: Payer: Self-pay | Admitting: Internal Medicine

## 2015-09-12 ENCOUNTER — Other Ambulatory Visit (INDEPENDENT_AMBULATORY_CARE_PROVIDER_SITE_OTHER): Payer: BLUE CROSS/BLUE SHIELD

## 2015-09-12 DIAGNOSIS — Z0000 Encounter for general adult medical examination without abnormal findings: Secondary | ICD-10-CM | POA: Diagnosis not present

## 2015-09-12 LAB — CBC WITH DIFFERENTIAL/PLATELET
Basophils Absolute: 0.1 10*3/uL (ref 0.0–0.1)
Basophils Relative: 0.6 % (ref 0.0–3.0)
EOS PCT: 2.4 % (ref 0.0–5.0)
Eosinophils Absolute: 0.3 10*3/uL (ref 0.0–0.7)
HCT: 44.8 % (ref 39.0–52.0)
Hemoglobin: 14.7 g/dL (ref 13.0–17.0)
Lymphocytes Relative: 21.1 % (ref 12.0–46.0)
Lymphs Abs: 2.2 10*3/uL (ref 0.7–4.0)
MCHC: 32.8 g/dL (ref 30.0–36.0)
MCV: 80.5 fl (ref 78.0–100.0)
MONO ABS: 0.9 10*3/uL (ref 0.1–1.0)
MONOS PCT: 8.5 % (ref 3.0–12.0)
Neutro Abs: 6.9 10*3/uL (ref 1.4–7.7)
Neutrophils Relative %: 67.4 % (ref 43.0–77.0)
Platelets: 256 10*3/uL (ref 150.0–400.0)
RBC: 5.57 Mil/uL (ref 4.22–5.81)
RDW: 14.9 % (ref 11.5–15.5)
WBC: 10.3 10*3/uL (ref 4.0–10.5)

## 2015-09-12 LAB — LIPID PANEL
CHOLESTEROL: 172 mg/dL (ref 0–200)
HDL: 36.5 mg/dL — AB (ref >39.00)
LDL Cholesterol: 119 mg/dL — ABNORMAL HIGH (ref 0–99)
NonHDL: 135.56
Total CHOL/HDL Ratio: 5
Triglycerides: 83 mg/dL (ref 0.0–149.0)
VLDL: 16.6 mg/dL (ref 0.0–40.0)

## 2015-09-12 LAB — COMPREHENSIVE METABOLIC PANEL
ALBUMIN: 3.8 g/dL (ref 3.5–5.2)
ALK PHOS: 66 U/L (ref 39–117)
ALT: 24 U/L (ref 0–53)
AST: 17 U/L (ref 0–37)
BUN: 14 mg/dL (ref 6–23)
CO2: 25 mEq/L (ref 19–32)
Calcium: 9.2 mg/dL (ref 8.4–10.5)
Chloride: 106 mEq/L (ref 96–112)
Creatinine, Ser: 1.04 mg/dL (ref 0.40–1.50)
GFR: 89.48 mL/min (ref >60.00)
Glucose, Bld: 91 mg/dL (ref 70–99)
POTASSIUM: 3.8 meq/L (ref 3.5–5.1)
Sodium: 139 mEq/L (ref 135–145)
TOTAL PROTEIN: 7.6 g/dL (ref 6.0–8.3)
Total Bilirubin: 0.9 mg/dL (ref 0.2–1.2)

## 2015-09-12 LAB — TSH: TSH: 2.62 u[IU]/mL (ref 0.35–4.50)

## 2015-09-16 ENCOUNTER — Telehealth: Payer: Self-pay

## 2015-09-16 NOTE — Telephone Encounter (Signed)
PA approved pharmacy notified 

## 2015-09-16 NOTE — Telephone Encounter (Signed)
PA initiated via covermymeds. [id number]

## 2015-10-09 ENCOUNTER — Other Ambulatory Visit: Payer: Self-pay | Admitting: Neurology

## 2015-12-02 ENCOUNTER — Other Ambulatory Visit: Payer: Self-pay | Admitting: Neurology

## 2015-12-06 ENCOUNTER — Other Ambulatory Visit: Payer: Self-pay | Admitting: Neurology

## 2015-12-08 ENCOUNTER — Telehealth: Payer: BLUE CROSS/BLUE SHIELD | Admitting: Family

## 2015-12-08 DIAGNOSIS — R6889 Other general symptoms and signs: Secondary | ICD-10-CM

## 2015-12-08 NOTE — Progress Notes (Signed)
E visit for Flu like symptoms   We are sorry that you are not feeling well.  Here is how we plan to help! Based on what you have shared with me it looks like you may have a respiratory virus that may be influenza.  Influenza or "the flu" is   an infection caused by a respiratory virus. The flu virus is highly contagious and persons who did not receive their yearly flu vaccination may "catch" the flu from close contact.  We have anti-viral medications to treat the viruses that cause this infection. They are not a "cure" and only shorten the course of the infection. These prescriptions are most effective when they are given within the first 2 days of "flu" symptoms. Antiviral medication are indicated if you have a high risk of complications from the flu. You should  also consider an antiviral medication if you are in close contact with someone who is at risk. These medications can help patients avoid complications from the flu  but have side effects that you should know. Possible side effects from Tamiflu or oseltamivir include nausea, vomiting, diarrhea, dizziness, headaches, eye redness, sleep problems or other respiratory symptoms. You should not take Tamiflu if you have an allergy to oseltamivir or any to the ingredients in Tamiflu.  Based upon your symptoms and potential risk factors I recommend that you follow the flu symptoms recommendation that I have listed below. You are outside of the 48 hour window for Tamiful.  ANYONE WHO HAS FLU SYMPTOMS SHOULD: . Stay home. The flu is highly contagious and going out or to work exposes others! . Be sure to drink plenty of fluids. Water is fine as well as fruit juices, sodas and electrolyte beverages. You may want to stay away from caffeine or alcohol. If you are nauseated, try taking small sips of liquids. How do you know if you are getting enough fluid? Your urine should be a pale yellow or almost colorless. . Get rest. . Taking a steamy shower or using  a humidifier may help nasal congestion and ease sore throat pain. Using a saline nasal spray works much the same way. . Cough drops, hard candies and sore throat lozenges may ease your cough. . Line up a caregiver. Have someone check on you regularly.   GET HELP RIGHT AWAY IF: . You cannot keep down liquids or your medications. . You become short of breath . Your fell like you are going to pass out or loose consciousness. . Your symptoms persist after you have completed your treatment plan MAKE SURE YOU   Understand these instructions.  Will watch your condition.  Will get help right away if you are not doing well or get worse.  Your e-visit answers were reviewed by a board certified advanced clinical practitioner to complete your personal care plan.  Depending on the condition, your plan could have included both over the counter or prescription medications.  If there is a problem please reply  once you have received a response from your provider.  Your safety is important to [place].  If you have drug allergies check your prescription carefully.    You can use MyChart to ask questions about today's visit, request a non-urgent call back, or ask for a work or school excuse for 24 hours related to this e-Visit. If it has been greater than 24 hours you will need to follow up with your provider, or enter a new e-Visit to address those concerns.  You will  get an e-mail in the next two days asking about your experience.  I hope that your e-visit has been valuable and will speed your recovery. Thank you for using e-visits.

## 2015-12-10 NOTE — Telephone Encounter (Signed)
Patient called back and states he is taking Rx indomethacin 150 mg 2 times per day. Please call him on his cell if additional info needed.

## 2015-12-10 NOTE — Telephone Encounter (Signed)
LVM for pt to call back to clarify if he is taking indomethacin or not. If so, what dose he is taking. Gave [facility] phone number.

## 2015-12-11 ENCOUNTER — Telehealth: Payer: Self-pay | Admitting: Neurology

## 2015-12-11 NOTE — Telephone Encounter (Signed)
Called pt back. Advised per Dr Jaynee Eagles last office note, he was given samples of trokendi to trial and see if this helped with migraines. If so, she was going to wean off of indomethacin. He stated he took samples of trokendi and when he ran out, he continued to take indomethacin. He did not notice any difference taking trokendi. He said he tolerated medication ok and he was fine with trying this again if Dr Jaynee Eagles wants to have him do that. Advised I will ask Dr Jaynee Eagles and call him back to advise. He understands.

## 2015-12-11 NOTE — Telephone Encounter (Signed)
Pt called sts he rec'd text message from pharmacy medication has been refused again. He is inquiring why.

## 2015-12-11 NOTE — Telephone Encounter (Signed)
error 

## 2015-12-12 ENCOUNTER — Other Ambulatory Visit: Payer: Self-pay | Admitting: *Deleted

## 2015-12-12 ENCOUNTER — Other Ambulatory Visit: Payer: Self-pay | Admitting: Neurology

## 2015-12-12 MED ORDER — INDOMETHACIN ER 75 MG PO CPCR: 75.0000 mg | ORAL_CAPSULE | Freq: Two times a day (BID) | ORAL | Status: DC

## 2015-12-12 NOTE — Addendum Note (Signed)
Addended by: Hope Pigeon on: 12/12/2015 01:22 PM   Modules accepted: Medications

## 2015-12-12 NOTE — Telephone Encounter (Signed)
Emma, I will refill it and give him 3 refills, go ahead and give him refill sand I will talk to him about management. thanks

## 2015-12-12 NOTE — Telephone Encounter (Signed)
Per [facility], Indomethacin ER 75mg  #60 with 3 r/f escribed to [facility].  Pt. aware/fim

## 2015-12-12 NOTE — Telephone Encounter (Signed)
Indocin rx. escribed as requested by [facility].  I have spoken with Alexis [name] and let him know this has been done/fim

## 2016-01-11 DIAGNOSIS — G4730 Sleep apnea, unspecified: Secondary | ICD-10-CM | POA: Diagnosis not present

## 2016-02-05 DIAGNOSIS — M542 Cervicalgia: Secondary | ICD-10-CM | POA: Diagnosis not present

## 2016-02-05 DIAGNOSIS — M9903 Segmental and somatic dysfunction of lumbar region: Secondary | ICD-10-CM | POA: Diagnosis not present

## 2016-02-05 DIAGNOSIS — M545 Low back pain: Secondary | ICD-10-CM | POA: Diagnosis not present

## 2016-02-05 DIAGNOSIS — M9901 Segmental and somatic dysfunction of cervical region: Secondary | ICD-10-CM | POA: Diagnosis not present

## 2016-02-14 DIAGNOSIS — M542 Cervicalgia: Secondary | ICD-10-CM | POA: Diagnosis not present

## 2016-02-14 DIAGNOSIS — M9901 Segmental and somatic dysfunction of cervical region: Secondary | ICD-10-CM | POA: Diagnosis not present

## 2016-02-14 DIAGNOSIS — M9903 Segmental and somatic dysfunction of lumbar region: Secondary | ICD-10-CM | POA: Diagnosis not present

## 2016-02-14 DIAGNOSIS — M545 Low back pain: Secondary | ICD-10-CM | POA: Diagnosis not present

## 2016-02-18 ENCOUNTER — Telehealth: Payer: Self-pay | Admitting: Neurology

## 2016-02-18 NOTE — Telephone Encounter (Signed)
Terrence Dupont, I spoke to [facility]. Can you put him on my schedule at 10am Monday the 15th please? Thank you !

## 2016-02-18 NOTE — Telephone Encounter (Signed)
Placed pt on 02/24/16 at 10am per Dr Jaynee Eagles request.

## 2016-02-19 DIAGNOSIS — M545 Low back pain: Secondary | ICD-10-CM | POA: Diagnosis not present

## 2016-02-19 DIAGNOSIS — M9903 Segmental and somatic dysfunction of lumbar region: Secondary | ICD-10-CM | POA: Diagnosis not present

## 2016-02-19 DIAGNOSIS — M542 Cervicalgia: Secondary | ICD-10-CM | POA: Diagnosis not present

## 2016-02-19 DIAGNOSIS — M9901 Segmental and somatic dysfunction of cervical region: Secondary | ICD-10-CM | POA: Diagnosis not present

## 2016-02-24 ENCOUNTER — Encounter: Payer: Self-pay | Admitting: Neurology

## 2016-02-24 ENCOUNTER — Ambulatory Visit (INDEPENDENT_AMBULATORY_CARE_PROVIDER_SITE_OTHER): Payer: BLUE CROSS/BLUE SHIELD | Admitting: Neurology

## 2016-02-24 VITALS — BP 129/96 | HR 93 | Ht 78.0 in | Wt 398.4 lb

## 2016-02-24 DIAGNOSIS — G43109 Migraine with aura, not intractable, without status migrainosus: Secondary | ICD-10-CM

## 2016-02-24 DIAGNOSIS — G43809 Other migraine, not intractable, without status migrainosus: Secondary | ICD-10-CM | POA: Diagnosis not present

## 2016-02-24 NOTE — Patient Instructions (Signed)
Remember to drink plenty of fluid, eat healthy meals and do not skip any meals. Try to eat protein with a every meal and eat a healthy snack such as fruit or nuts in between meals. Try to keep a regular sleep-wake schedule and try to exercise daily, particularly in the form of walking, 20-30 minutes a day, if you can.   As far as your medications are concerned, I would like to suggest: At onset of migraine take Zembrace may repeat in one hour. Coral Ceo may repeat in  2 hours  As far as diagnostic testing: cmp, cbc  I would like to see you back in 1 year, sooner if we need to. Please call us with any interim questions, concerns, problems, updates or refill requests.   Our phone number is [phone]. We also have an after hours call service for urgent matters and there is a physician on-call for urgent questions. For any emergencies you know to call 911 or go to the nearest emergency room

## 2016-02-24 NOTE — Progress Notes (Signed)
[name]    Provider:  Dr Jaynee Eagles Referring Provider: No ref. provider found Primary Care Physician:  Scarlette Calico, MD  CC: Headache  Interval Update 02/27/2016:  He ws at work. Last Wednesday. He had acute onset of left eye vision loss. Then had a headache and lasted all day. Would not go away. Typical headache migraines, left side, throbbing, light sensitivity, sound sensitivity. He has a lot of light and sound sensitivity. No nausea or vomiting with the headaches. He has 4-5 headache days a month. Not typically any are migrainous. Had taken imitrex orally in the past. Migraines can be quick. He wakes up with a headache. He had a sleep study and he does not have sleep apnea. Did not need a cpap. They are not serious, they tend to go away quickly. Not often does he have the headaches. He is on Topamax and Verapamil and doing well.   Initial visit 11/14/2014 : Alexis [name] is a 30 y.o. male here as a referral from Dr. Ronnald Ramp for headache.   Migraines are well controlled if he takes the indomethacin. If he falls asleep and forgets the indomethacin on the couch he will wake up with a headache. He will wake up at 4am with a headache. He is on Verapamil and has not missed a dose. He does not have a lot of nausea with the migraines. No signs of OSA.   He is on Wellbutrin. He says that he has been well on Wellbutrin for many years but he is unsure if it is helping him anymore. He has decreased energy and decreased concentration and he has gained weight and that makes him depressed.  Discussed that people with headaches often have decreased serotonin levels and that if location is not helping maybe we should change to an SSRI.    11/14/2014: Patient is here as an add on for new onset severe headache. PMHx of occular migraines. He is on 120mg  verapamil and completely controlled. Friday night they were at the theatre. The most he laughed the pressure was building in his head. It is  on the left side. Throbbing is constant. He also feels stabbing in the brain in the temporal area. He has constant pain Extremely painful. The pain was so bad that his BP shot up when he was on telemetry in the ED. The pain is constant but then he has periods of elevated pain in the head, the pain stabs and holds for a minute. The worst pain he has ever felt in his life. Severe 10/10. Eye waters, keeps getting hot flashes, clammy feeling. +light sensitivity. It has been continuous. No weakness, vision. Has some pulsatile hearing changes. He has been sick recently and has a sore throat and a sinus infection.  Reviewed notes, labs and imaging from outside physicians, which showed: MRI brain w/wo contrast 04/2012 showed no acute intracranial abnormalities including mass lesion or mass effect, hydrocephalus, extra-axial fluid collection, midline shift, hemorrhage, or acute infarction, large ischemic events (personally reviewed images). BMP/CMP unremarkable.   Review of Systems: Patient complains of symptoms per HPI as well as the following symptoms: Eye itching, eye redness, blurred vision, environmental allergies, headache, trouble swallowing, murmur, agitation, decreased concentration, depression, anxiety. Pertinent negatives per HPI. All others negative.   Social History   Social History  . Marital Status: Single    Spouse Name: N/A  . Number of Children: 0  . Years of Education: College   Occupational History  . Paramedic  [employer]   Social History Main Topics  . Smoking status: Never Smoker   . Smokeless tobacco: Never Used  . Alcohol Use: No     Comment: socially  . Drug Use: No  . Sexual Activity: Not Currently   Other Topics Concern  . Not on file   Social History Narrative   Lives at home with parents.   Works at [employer].   Single.   Caffeine use: 3-4 cups daily.        Family History  Problem Relation Age of Onset  . Hypertension Father   . Diabetes Father      type 2   . Emphysema Maternal Grandmother     smoker  . Asthma Maternal Grandmother   . Lung cancer Maternal Grandmother   . Allergies      whole family-both sides of family  . Asthma Mother   . Clotting disorder Mother     Factor 5  . Rheum arthritis Mother   . Skin cancer Mother   . Asthma Paternal Grandmother   . Diabetes Paternal Grandmother     type 2  . Heart disease      MGM deceased with MI; both sides of grandparents  . Clotting disorder Maternal Uncle     Factor 5    Past Medical History  Diagnosis Date  . ALLERGIC RHINITIS 06/10/2009  . ADD 09/13/2007  . ASTHMA, UNSPECIFIED, UNSPECIFIED STATUS 07/19/2008  . DEPRESSIVE DISORDER 09/13/2007  . DYSMETABOLIC SYNDROME 0000000  . GERD 09/13/2007  . HYPERLIPIDEMIA 10/10/2009  . HYPERTENSION 09/13/2007  . IBS 03/20/2009  . Morbid obesity ([place]) 07/19/2008  . Skin cancer   . Acid reflux     Past Surgical History  Procedure Laterality Date  . Appendectomy  2005  . Ankle surgery  2007  . Skin cancer excision      teenager    Current Outpatient Prescriptions  Medication Sig Dispense Refill  . buPROPion (WELLBUTRIN) 75 MG tablet Take 1 tablet (75 mg total) by mouth daily. 90 tablet 3  . celecoxib (CELEBREX) 200 MG capsule TAKE ONE CAPSULE BY MOUTH EVERY DAY 90 capsule 3  . EPINEPHrine (EPI-PEN) 0.3 mg/0.3 mL DEVI Inject 0.3 mLs (0.3 mg total) into the muscle once. 1 Device prn  . losartan (COZAAR) 100 MG tablet Take 1 tablet (100 mg total) by mouth daily. 90 tablet 2  . Nutritional Supplements (JUICE PLUS FIBRE PO) Take 1 capsule by mouth daily.    Marland Kitchen omeprazole (PRILOSEC) 40 MG capsule TAKE 1 CAPSULE BY MOUTH TWICE A DAY 180 capsule 3  . topiramate (TOPAMAX) 50 MG tablet Take 1 tablet (50 mg total) by mouth 2 (two) times daily. 60 tablet 11  . verapamil (CALAN-SR) 120 MG CR tablet TAKE ONE CAPSULE BY MOUTH EVERY DAY 90 tablet 2   Current Facility-Administered Medications  Medication Dose Route Frequency Provider Last  Rate Last Dose  . valproate (DEPACON) 1,000 mg in sodium chloride 0.9 % 100 mL IVPB  1,000 mg Intravenous Continuous Melvenia Beam, MD 110 mL/hr at 11/14/14 1530 1,000 mg at 11/14/14 1530    Allergies as of 02/24/2016 - Review Complete 02/24/2016  Allergen Reaction Noted  . Citrus Anaphylaxis and Other (See Comments) 09/04/2012  . Amoxicillin-pot clavulanate    . Sulfamethoxazole-trimethoprim Other (See Comments)     Vitals: BP 129/96 mmHg  Pulse 93  Ht 6\' 6"  (1.981 m)  Wt 398 lb 6.4 oz (180.713 kg)  BMI 46.05 kg/m2 Last Weight:  Wt  Readings from Last 1 Encounters:  02/24/16 398 lb 6.4 oz (180.713 kg)   Last Height:   Ht Readings from Last 1 Encounters:  02/24/16 6\' 6"  (1.981 m)    Physical exam: Exam: Gen: NAD, conversant, well nourised, overweight, well groomed  CV: RRR, no MRG. No Carotid Bruits. No peripheral edema, warm, nontender Eyes: Conjunctivae clear without exudates or hemorrhage  Neuro: Detailed Neurologic Exam  Speech:  Speech is normal; fluent and spontaneous with normal comprehension.  Cognition:  The patient is oriented to person, place, and time;   recent and remote memory intact;   language fluent;   normal attention, concentration,   fund of knowledge Cranial Nerves:  The pupils are equal, round, and reactive to light. The fundi are normal and spontaneous venous pulsations are present. Visual fields are full to finger confrontation. Extraocular movements are intact. Trigeminal sensation is intact and the muscles of mastication are normal. The face is symmetric. The palate elevates in the midline. Hearing intact. Voice is normal. Shoulder shrug is normal. The tongue has normal motion without fasciculations.     Assessment/Plan: This is a lovely 30 year old male with migraines   Continue Topiramate for prevention Zembrace or Onzetra at onset of breakthrough migraine. Can repeat in 2 hours. Do not take more  than 2 in one day or more than 2 days a week   Sarina Ill, MD  [facility] [address] [place], [place] [address]  Phone [phone] Fax [phone]  A total of 30 minutes was spent face-to-face with this patient. Over half this time was spent on counseling patient on the migraine diagnosis and different diagnostic and therapeutic options available.

## 2016-02-26 DIAGNOSIS — M542 Cervicalgia: Secondary | ICD-10-CM | POA: Diagnosis not present

## 2016-02-26 DIAGNOSIS — M9901 Segmental and somatic dysfunction of cervical region: Secondary | ICD-10-CM | POA: Diagnosis not present

## 2016-02-26 DIAGNOSIS — M9903 Segmental and somatic dysfunction of lumbar region: Secondary | ICD-10-CM | POA: Diagnosis not present

## 2016-02-26 DIAGNOSIS — M545 Low back pain: Secondary | ICD-10-CM | POA: Diagnosis not present

## 2016-03-04 DIAGNOSIS — M545 Low back pain: Secondary | ICD-10-CM | POA: Diagnosis not present

## 2016-03-04 DIAGNOSIS — M9901 Segmental and somatic dysfunction of cervical region: Secondary | ICD-10-CM | POA: Diagnosis not present

## 2016-03-04 DIAGNOSIS — M9903 Segmental and somatic dysfunction of lumbar region: Secondary | ICD-10-CM | POA: Diagnosis not present

## 2016-03-04 DIAGNOSIS — M542 Cervicalgia: Secondary | ICD-10-CM | POA: Diagnosis not present

## 2016-03-25 ENCOUNTER — Other Ambulatory Visit: Payer: Self-pay | Admitting: Otolaryngology

## 2016-03-25 DIAGNOSIS — M9903 Segmental and somatic dysfunction of lumbar region: Secondary | ICD-10-CM | POA: Diagnosis not present

## 2016-03-25 DIAGNOSIS — M545 Low back pain: Secondary | ICD-10-CM | POA: Diagnosis not present

## 2016-03-25 DIAGNOSIS — M9901 Segmental and somatic dysfunction of cervical region: Secondary | ICD-10-CM | POA: Diagnosis not present

## 2016-03-25 DIAGNOSIS — M542 Cervicalgia: Secondary | ICD-10-CM | POA: Diagnosis not present

## 2016-03-26 ENCOUNTER — Other Ambulatory Visit: Payer: Self-pay | Admitting: Otolaryngology

## 2016-03-26 ENCOUNTER — Ambulatory Visit
Admission: RE | Admit: 2016-03-26 | Discharge: 2016-03-26 | Disposition: A | Discharge status: Home / Self Care | Payer: BLUE CROSS/BLUE SHIELD | Source: Ambulatory Visit | Attending: Otolaryngology | Admitting: Otolaryngology

## 2016-03-26 DIAGNOSIS — J019 Acute sinusitis, unspecified: Secondary | ICD-10-CM

## 2016-03-26 DIAGNOSIS — J323 Chronic sphenoidal sinusitis: Secondary | ICD-10-CM

## 2016-03-26 DIAGNOSIS — J322 Chronic ethmoidal sinusitis: Secondary | ICD-10-CM

## 2016-03-26 DIAGNOSIS — J0110 Acute frontal sinusitis, unspecified: Secondary | ICD-10-CM | POA: Diagnosis not present

## 2016-03-27 ENCOUNTER — Ambulatory Visit (INDEPENDENT_AMBULATORY_CARE_PROVIDER_SITE_OTHER): Payer: BLUE CROSS/BLUE SHIELD | Admitting: Infectious Diseases

## 2016-03-27 ENCOUNTER — Encounter: Payer: Self-pay | Admitting: Infectious Diseases

## 2016-03-27 ENCOUNTER — Other Ambulatory Visit: Payer: Self-pay | Admitting: Infectious Diseases

## 2016-03-27 ENCOUNTER — Encounter: Payer: Self-pay | Admitting: *Deleted

## 2016-03-27 ENCOUNTER — Telehealth: Payer: Self-pay | Admitting: Internal Medicine

## 2016-03-27 ENCOUNTER — Ambulatory Visit (HOSPITAL_COMMUNITY)
Admission: RE | Admit: 2016-03-27 | Discharge: 2016-03-27 | Disposition: A | Discharge status: Home / Self Care | Payer: BLUE CROSS/BLUE SHIELD | Source: Ambulatory Visit | Attending: Infectious Diseases | Admitting: Infectious Diseases

## 2016-03-27 VITALS — BP 142/110 | HR 89 | Temp 98.6°F | Wt 398.0 lb

## 2016-03-27 DIAGNOSIS — K047 Periapical abscess without sinus: Secondary | ICD-10-CM

## 2016-03-27 DIAGNOSIS — J0100 Acute maxillary sinusitis, unspecified: Secondary | ICD-10-CM

## 2016-03-27 DIAGNOSIS — Z452 Encounter for adjustment and management of vascular access device: Secondary | ICD-10-CM | POA: Diagnosis not present

## 2016-03-27 LAB — COMPREHENSIVE METABOLIC PANEL
ALK PHOS: 67 U/L (ref 40–115)
ALT: 24 U/L (ref 9–46)
AST: 21 U/L (ref 10–40)
Albumin: 3.9 g/dL (ref 3.6–5.1)
BILIRUBIN TOTAL: 0.8 mg/dL (ref 0.2–1.2)
BUN: 13 mg/dL (ref 7–25)
CALCIUM: 8.9 mg/dL (ref 8.6–10.3)
CO2: 21 mmol/L (ref 20–31)
Chloride: 106 mmol/L (ref 98–110)
Creat: 0.98 mg/dL (ref 0.60–1.35)
Glucose, Bld: 79 mg/dL (ref 65–99)
POTASSIUM: 4.3 mmol/L (ref 3.5–5.3)
Sodium: 138 mmol/L (ref 135–146)
TOTAL PROTEIN: 7.1 g/dL (ref 6.1–8.1)

## 2016-03-27 LAB — CBC
HEMATOCRIT: 42.5 % (ref 38.5–50.0)
Hemoglobin: 14.4 g/dL (ref 13.2–17.1)
MCH: 26.3 pg — ABNORMAL LOW (ref 27.0–33.0)
MCHC: 33.9 g/dL (ref 32.0–36.0)
MCV: 77.7 fL — AB (ref 80.0–100.0)
MPV: 10.2 fL (ref 7.5–12.5)
Platelets: 250 10*3/uL (ref 140–400)
RBC: 5.47 MIL/uL (ref 4.20–5.80)
RDW: 14.7 % (ref 11.0–15.0)
WBC: 7.3 10*3/uL (ref 3.8–10.8)

## 2016-03-27 LAB — C-REACTIVE PROTEIN: CRP: 1.6 mg/dL — ABNORMAL HIGH (ref <0.60)

## 2016-03-27 MED ORDER — HEPARIN SOD (PORK) LOCK FLUSH 100 UNIT/ML IV SOLN
INTRAVENOUS | Status: AC
  Administered 2016-03-27: 500 [IU]
  Filled 2016-03-27: qty 5

## 2016-03-27 MED ORDER — LIDOCAINE HCL 1 % IJ SOLN
INTRAMUSCULAR | Status: AC
  Administered 2016-03-27: 10 mL
  Filled 2016-03-27: qty 20

## 2016-03-27 NOTE — Telephone Encounter (Signed)
Patient had picc line placed today in IR, however, there may have been lapse in communication to where he was to get first dose of antibiotics (short stay ) vs. Home. [facility] has not received order thus mrs. Rumbaugh was worried that no abtx had been sent to their home. i have reached out to [facility] to arrange for antibiotics to be delivered over teh weekend.  Will investigate how to improve process/lapse of communication through clinic or short stay

## 2016-03-27 NOTE — Assessment & Plan Note (Signed)
See below

## 2016-03-27 NOTE — Progress Notes (Signed)
   Subjective:    Patient ID: Alexis [name], male    DOB: 1986-06-11, 30 y.o.   MRN: [id number]  HPI 30 yo M with hx of obesity, chronic sinusitis,root canal 8 yrs ago. States he has a dental root in one of his sinuses. He was to have crown (on this tooth) this year but needed "re-treatment". He was seen by periodontist (Dr Loyal Gambler) and had intervention in March 2017. Was started on doxy.  Facial fullness improved. Was ready for crown, had temp put on.  He had return of fullness and saw Dr Loyal Gambler again 6-6. Saw "shadow" on root of tooth.  Was started on clinda. He has remained on this.  Has had worsening sore throat since, cough congestion, cough with green sputum/blood tinged.  Roof of his mouth feels hypersensitive.  CT scan on 6-15 showed progression of mucosal edema in R maxillary sinus, progression of B ethmoid mucosal edema.  This Am woke up with his uvula swollen, laying on side of his tongue.   Review of Systems  Constitutional: Negative for fever, chills, appetite change and unexpected weight change.  HENT: Positive for dental problem and sinus pressure.   Respiratory: Positive for cough and wheezing.   Cardiovascular: Negative for chest pain.  Gastrointestinal: Negative for diarrhea and constipation.  Genitourinary: Negative for difficulty urinating.  pleuritic CP, occas wheeze.  Has had sweats followed by cold.      Objective:   Physical Exam  Constitutional: He appears well-developed and well-nourished.  HENT:  Mouth/Throat: No oropharyngeal exudate.  No sinus tenderness to percussion.  Mildly enlarged tonsils.  Mild softness in his upper palate adjacent to ~ 2nd molar.   Eyes: EOM are normal. Pupils are equal, round, and reactive to light.  Neck: Neck supple. No JVD present. No tracheal deviation present.  No evidence of jugular inflammation or tenderness.   Cardiovascular: Normal rate, regular rhythm and normal heart sounds.   Pulmonary/Chest: Effort normal. No  stridor. He has wheezes.  Mild wheezes  Abdominal: Soft. Bowel sounds are normal. There is no tenderness. There is no rebound.  Musculoskeletal: He exhibits no edema.  Lymphadenopathy:    He has no cervical adenopathy.       Assessment & Plan:

## 2016-03-27 NOTE — Procedures (Signed)
Successful placement of singl3 lumen PICC line to right basilic vein. Length 45 cm Tip at lower SVC/RA No complications Ready for use.   Phillip Maffei S Natanya Holecek PA-C 03/27/2016 1:27 PM

## 2016-03-27 NOTE — Assessment & Plan Note (Addendum)
His films and his story suggest that he has a dental space infection.  He does not have sx/si that would suggest lemiere's.  I offered to admit him to hospital but he does not have fever or chills in clinic, his BP is normal. Would suggest his cough is due to sinus issues.  He agrees to 21 days of invanz via pic.  I offered to send him to a new oral surgeon, will defer his f/u to this.  He will call clinic on Monday to let us konw how he is doing.  Will see him back 3 weeks.

## 2016-03-28 ENCOUNTER — Other Ambulatory Visit: Payer: Self-pay

## 2016-03-28 ENCOUNTER — Emergency Department (HOSPITAL_COMMUNITY): Payer: BLUE CROSS/BLUE SHIELD

## 2016-03-28 ENCOUNTER — Encounter (HOSPITAL_COMMUNITY): Payer: Self-pay | Admitting: *Deleted

## 2016-03-28 ENCOUNTER — Emergency Department (HOSPITAL_COMMUNITY)
Admission: EM | Admit: 2016-03-28 | Discharge: 2016-03-28 | Disposition: A | Discharge status: Home / Self Care | Payer: BLUE CROSS/BLUE SHIELD | Attending: Emergency Medicine | Admitting: Emergency Medicine

## 2016-03-28 DIAGNOSIS — R002 Palpitations: Secondary | ICD-10-CM | POA: Diagnosis not present

## 2016-03-28 DIAGNOSIS — I10 Essential (primary) hypertension: Secondary | ICD-10-CM | POA: Diagnosis not present

## 2016-03-28 DIAGNOSIS — Z452 Encounter for adjustment and management of vascular access device: Secondary | ICD-10-CM | POA: Diagnosis not present

## 2016-03-28 DIAGNOSIS — Z85828 Personal history of other malignant neoplasm of skin: Secondary | ICD-10-CM | POA: Diagnosis not present

## 2016-03-28 DIAGNOSIS — J45909 Unspecified asthma, uncomplicated: Secondary | ICD-10-CM | POA: Insufficient documentation

## 2016-03-28 DIAGNOSIS — K047 Periapical abscess without sinus: Secondary | ICD-10-CM | POA: Diagnosis not present

## 2016-03-28 LAB — CBC
HCT: 44.8 % (ref 39.0–52.0)
Hemoglobin: 15 g/dL (ref 13.0–17.0)
MCH: 26.5 pg (ref 26.0–34.0)
MCHC: 33.5 g/dL (ref 30.0–36.0)
MCV: 79.2 fL (ref 78.0–100.0)
PLATELETS: 228 10*3/uL (ref 150–400)
RBC: 5.66 MIL/uL (ref 4.22–5.81)
RDW: 14.1 % (ref 11.5–15.5)
WBC: 9.4 10*3/uL (ref 4.0–10.5)

## 2016-03-28 LAB — BASIC METABOLIC PANEL
Anion gap: 6 (ref 5–15)
BUN: 9 mg/dL (ref 6–20)
CHLORIDE: 110 mmol/L (ref 101–111)
CO2: 23 mmol/L (ref 22–32)
CREATININE: 1.09 mg/dL (ref 0.61–1.24)
Calcium: 9.2 mg/dL (ref 8.9–10.3)
GFR calc non Af Amer: >60 mL/min (ref >60)
Glucose, Bld: 94 mg/dL (ref 65–99)
Potassium: 3.7 mmol/L (ref 3.5–5.1)
SODIUM: 139 mmol/L (ref 135–145)

## 2016-03-28 LAB — SEDIMENTATION RATE: SED RATE: 8 mm/h (ref 0–15)

## 2016-03-28 LAB — I-STAT TROPONIN, ED: Troponin i, poc: 0 ng/mL (ref 0.00–0.08)

## 2016-03-28 MED ORDER — HEPARIN SOD (PORK) LOCK FLUSH 100 UNIT/ML IV SOLN
500.0000 [IU] | Freq: Once | INTRAVENOUS | Status: AC
  Administered 2016-03-28: 500 [IU]
  Filled 2016-03-28 (×2): qty 5

## 2016-03-28 MED ORDER — SODIUM CHLORIDE 0.9 % IV SOLN
1.0000 g | Freq: Once | INTRAVENOUS | Status: AC
  Administered 2016-03-28: 1 g via INTRAVENOUS
  Filled 2016-03-28: qty 1

## 2016-03-28 NOTE — ED Notes (Signed)
Pt c/o R arm pain since PICC line insertion yesterday, pt reports new onset palpitations last night, pt told to come here by home health RN when she came to his house to do abx in R arm, pt receiving abx for infection in sinuses, A&O x4, pt denies current CP & SOB, n/v/d

## 2016-03-28 NOTE — ED Notes (Signed)
Per Resident Marigene Ehlers, she would like patient to receive home antibiotic at this time.

## 2016-03-28 NOTE — ED Provider Notes (Signed)
CSN: [id number]     Arrival date & time 03/28/16  1533 History   First MD Initiated Contact with Patient 03/28/16 1805     Chief Complaint  Patient presents with  . Vascular Access Problem    HPI Comments: 30 y.o. male with a recently diagnosed dental/sinus infection, with a PICC line inserted yesterday, presents to the ED at the urging of his home health nurse due to palpitations. His home health nurse was concerned the PICC line may not be positioned appropriately. She told the patient that if the PICC line was not positioned appropriately, "the antibiotics could fry your brain". He had 2 episodes of palpitations yesterday. One lasted 90 seconds and the other lasted 2 hours. He has had no episodes of palpitations today. His home health nurse visited him today to administer his first dose of ertapenem. He has no other concerns today, and is otherwise feeling well now.  Patient is a 30 y.o. male presenting with palpitations. The history is provided by the patient.  Palpitations Palpitations quality:  Fast Onset quality:  Sudden Duration: 90 seconds yesterday afternoon, 2 hours last night. Timing:  Intermittent Progression:  Resolved Chronicity:  New Context comment:  Dental and sinus infection; PICC line inserted yesterday Relieved by:  Nothing Worsened by:  Nothing Ineffective treatments:  None tried Associated symptoms: no chest pain, no chest pressure, no lower extremity edema, no near-syncope, no shortness of breath, no syncope and no vomiting   Risk factors: no heart disease and no hx of atrial fibrillation     Past Medical History  Diagnosis Date  . ALLERGIC RHINITIS 06/10/2009  . ADD 09/13/2007  . ASTHMA, UNSPECIFIED, UNSPECIFIED STATUS 07/19/2008  . DEPRESSIVE DISORDER 09/13/2007  . DYSMETABOLIC SYNDROME 0000000  . GERD 09/13/2007  . HYPERLIPIDEMIA 10/10/2009  . HYPERTENSION 09/13/2007  . IBS 03/20/2009  . Morbid obesity ([place]) 07/19/2008  . Skin cancer   . Acid reflux    Past  Surgical History  Procedure Laterality Date  . Appendectomy  2005  . Ankle surgery  2007  . Skin cancer excision      teenager   Family History  Problem Relation Age of Onset  . Hypertension Father   . Diabetes Father     type 2   . Emphysema Maternal Grandmother     smoker  . Asthma Maternal Grandmother   . Lung cancer Maternal Grandmother   . Allergies      whole family-both sides of family  . Asthma Mother   . Clotting disorder Mother     Factor 5  . Rheum arthritis Mother   . Skin cancer Mother   . Asthma Paternal Grandmother   . Diabetes Paternal Grandmother     type 2  . Heart disease      MGM deceased with MI; both sides of grandparents  . Clotting disorder Maternal Uncle     Factor 5   Social History  Substance Use Topics  . Smoking status: Never Smoker   . Smokeless tobacco: Never Used  . Alcohol Use: No     Comment: socially    Review of Systems  Constitutional: Negative for fever and chills.  HENT: Negative for congestion.   Eyes: Negative for redness.  Respiratory: Negative for shortness of breath.   Cardiovascular: Positive for palpitations. Negative for chest pain, syncope and near-syncope.  Gastrointestinal: Negative for vomiting.  Genitourinary: Negative for flank pain.  Musculoskeletal: Negative for gait problem.  Skin: Negative for rash.  Neurological: Negative  for speech difficulty.  Psychiatric/Behavioral: Negative for confusion.      Allergies  Citrus; Amoxicillin-pot clavulanate; and Sulfamethoxazole-trimethoprim  Home Medications   Prior to Admission medications   Medication Sig Start Date End Date Taking? Authorizing Provider  buPROPion (WELLBUTRIN) 75 MG tablet Take 1 tablet (75 mg total) by mouth daily. 09/09/15   Janith Lima, MD  celecoxib (CELEBREX) 200 MG capsule TAKE ONE CAPSULE BY MOUTH EVERY DAY 09/09/15   Janith Lima, MD  EPINEPHrine (EPI-PEN) 0.3 mg/0.3 mL DEVI Inject 0.3 mLs (0.3 mg total) into the muscle once.  02/23/13   Deneise Lever, MD  losartan (COZAAR) 100 MG tablet Take 1 tablet (100 mg total) by mouth daily. 09/09/15   Janith Lima, MD  Nutritional Supplements (JUICE PLUS FIBRE PO) Take 1 capsule by mouth daily.    Historical Provider, MD  omeprazole (PRILOSEC) 40 MG capsule TAKE 1 CAPSULE BY MOUTH TWICE A DAY 09/09/15   Janith Lima, MD  topiramate (TOPAMAX) 50 MG tablet Take 1 tablet (50 mg total) by mouth 2 (two) times daily. 05/16/15   Melvenia Beam, MD   BP 161/109 mmHg  Pulse 104  Temp(Src) 98.5 F (36.9 C) (Oral)  Ht 6\' 6"  (1.981 m)  SpO2 99% Physical Exam  Constitutional: He is oriented to person, place, and time. He appears well-developed and well-nourished. No distress.  HENT:  Head: Normocephalic and atraumatic.  Right Ear: External ear normal.  Left Ear: External ear normal.  Mouth/Throat: Oropharynx is clear and moist.  Neck: Normal range of motion.  Cardiovascular: Normal rate, regular rhythm and intact distal pulses.   Pulmonary/Chest: Effort normal and breath sounds normal.  Abdominal: Soft. Bowel sounds are normal. He exhibits no distension. There is no tenderness.  Neurological: He is alert and oriented to person, place, and time.  Skin: Skin is warm and dry. No rash noted. He is not diaphoretic.  Skin surrounding picc is without erythema, tenderness, looks well  Psychiatric: He has a normal mood and affect. His behavior is normal.    ED Course  Procedures (including critical care time) [place], ED    Imaging Review Dg Chest 2 View  03/28/2016  CLINICAL DATA:  Status post PICC placement. EXAM: CHEST  2 VIEW COMPARISON:  Radiograph of October 10, 2014. FINDINGS: The heart size and mediastinal contours are within normal limits. Both lungs are clear. No pneumothorax or pleural effusion is noted. Interval placement of right-sided PICC line with distal tip in expected position of SVC. The  visualized skeletal structures are unremarkable. IMPRESSION: No active cardiopulmonary disease. Interval placement of right-sided PICC line with distal tip in expected position of SVC. Electronically Signed   By: Marijo Conception, M.D.   On: 03/28/2016 16:30   Ir Fluoro Guide Cv Line Right  03/27/2016  INDICATION: Dental infection. Need for central venous access for long term intravenous antibiotics. EXAM: RIGHT UPPER EXTREMITY PICC LINE PLACEMENT WITH ULTRASOUND AND FLUOROSCOPIC GUIDANCE MEDICATIONS: 1% Lidocaine. ANESTHESIA/SEDATION: No sedation medication given. FLUOROSCOPY TIME:  Fluoroscopy Time: 0 minutes 24 seconds. COMPLICATIONS: None immediate. PROCEDURE: The patient was advised of the possible risks and complications and agreed to undergo the procedure. The patient was then brought to the angiographic suite for the procedure. The right arm was prepped with chlorhexidine, draped in the usual sterile fashion using maximum barrier technique (cap and mask, sterile gown, sterile gloves, large sterile sheet, hand hygiene and  cutaneous antisepsis) and infiltrated locally with 1% Lidocaine. Ultrasound demonstrated patency of the right basilic vein, and this was documented with an image. Under real-time ultrasound guidance, this vein was accessed with a 21 gauge micropuncture needle and image documentation was performed. A 0.018 wire was introduced in to the vein. Over this, a 5 [place] single lumen power-injectable PICC was advanced to the lower SVC/right atrial junction. Fluoroscopy during the procedure and fluoro spot radiograph confirms appropriate catheter position. The catheter was flushed and covered with a sterile dressing. Catheter length:  45 cm IMPRESSION: Successful right arm Power PICC line placement with ultrasound and fluoroscopic guidance. The catheter is ready for use. Read by:  Gareth Eagle, PA-C Electronically Signed   By: Corrie Mckusick D.O.   On: 03/27/2016 13:26   Ir US Guide Vasc Access  Right  03/27/2016  INDICATION: Dental infection. Need for central venous access for long term intravenous antibiotics. EXAM: RIGHT UPPER EXTREMITY PICC LINE PLACEMENT WITH ULTRASOUND AND FLUOROSCOPIC GUIDANCE MEDICATIONS: 1% Lidocaine. ANESTHESIA/SEDATION: No sedation medication given. FLUOROSCOPY TIME:  Fluoroscopy Time: 0 minutes 24 seconds. COMPLICATIONS: None immediate. PROCEDURE: The patient was advised of the possible risks and complications and agreed to undergo the procedure. The patient was then brought to the angiographic suite for the procedure. The right arm was prepped with chlorhexidine, draped in the usual sterile fashion using maximum barrier technique (cap and mask, sterile gown, sterile gloves, large sterile sheet, hand hygiene and cutaneous antisepsis) and infiltrated locally with 1% Lidocaine. Ultrasound demonstrated patency of the right basilic vein, and this was documented with an image. Under real-time ultrasound guidance, this vein was accessed with a 21 gauge micropuncture needle and image documentation was performed. A 0.018 wire was introduced in to the vein. Over this, a 5 [place] single lumen power-injectable PICC was advanced to the lower SVC/right atrial junction. Fluoroscopy during the procedure and fluoro spot radiograph confirms appropriate catheter position. The catheter was flushed and covered with a sterile dressing. Catheter length:  45 cm IMPRESSION: Successful right arm Power PICC line placement with ultrasound and fluoroscopic guidance. The catheter is ready for use. Read by:  Gareth Eagle, PA-C Electronically Signed   By: Corrie Mckusick D.O.   On: 03/27/2016 13:26   I have personally reviewed and evaluated these images and lab results as part of my medical decision-making.   EKG Interpretation None      MDM   Final diagnoses:  Palpitations    30 y.o. male presents due to palpitations, and because his home health nurse advised him to be evaluated. Chest x-ray  shows that his PICC line is in satisfactory position. CBC, BMP and troponin are all unremarkable. His EKG shows no evidence of WPW, Brugada or any other concerning findings. He was given his dose of Invanz due to the fact that his home health nurse did not administer it today. He tolerated this well. He continued to maintain normal sinus rhythm while in the ED. He had no further episodes of palpitations.  Strict return precautions were discussed and given writing. He is to follow-up with his primary care physician regarding his palpitations.  Case managed in conjunction with my attending, Dr. Laneta Simmers.   Berenice Primas, MD 03/29/16 [id number]  Leo Grosser, MD 03/29/16 [phone]

## 2016-03-28 NOTE — ED Notes (Signed)
Spoke with pharmacy regarding administration of antibiotic. Per pharmacist it is not part of this hospital policy to allow patient to take home medication or administer IV home medication. Will notify MD batista.

## 2016-03-28 NOTE — Discharge Instructions (Signed)
Your EKG and labs were normal today. Follow up with your primary care physician regarding her palpitations. Your chest x-ray showed that her PICC line was in an appropriate position. Return to the ER for any new or worsening symptoms, such as passing out, chest pain, or if you have any other concerns.  Palpitations A palpitation is the feeling that your heartbeat is irregular or is faster than normal. It may feel like your heart is fluttering or skipping a beat. Palpitations are usually not a serious problem. However, in some cases, you may need further medical evaluation. CAUSES  Palpitations can be caused by:  Smoking.  Caffeine or other stimulants, such as diet pills or energy drinks.  Alcohol.  Stress and anxiety.  Strenuous physical activity.  Fatigue.  Certain medicines.  Heart disease, especially if you have a history of irregular heart rhythms (arrhythmias), such as atrial fibrillation, atrial flutter, or supraventricular tachycardia.  An improperly working pacemaker or defibrillator. DIAGNOSIS  To find the cause of your palpitations, your health care provider will take your medical history and perform a physical exam. Your health care provider may also have you take a test called an ambulatory electrocardiogram (ECG). An ECG records your heartbeat patterns over a 24-hour period. You may also have other tests, such as:  Transthoracic echocardiogram (TTE). During echocardiography, sound waves are used to evaluate how blood flows through your heart.  Transesophageal echocardiogram (TEE).  Cardiac monitoring. This allows your health care provider to monitor your heart rate and rhythm in real time.  Holter monitor. This is a portable device that records your heartbeat and can help diagnose heart arrhythmias. It allows your health care provider to track your heart activity for several days, if needed.  Stress tests by exercise or by giving medicine that makes the heart beat  faster. TREATMENT  Treatment of palpitations depends on the cause of your symptoms and can vary greatly. Most cases of palpitations do not require any treatment other than time, relaxation, and monitoring your symptoms. Other causes, such as atrial fibrillation, atrial flutter, or supraventricular tachycardia, usually require further treatment. HOME CARE INSTRUCTIONS   Avoid:  Caffeinated coffee, tea, soft drinks, diet pills, and energy drinks.  Chocolate.  Alcohol.  Stop smoking if you smoke.  Reduce your stress and anxiety. Things that can help you relax include:  A method of controlling things in your body, such as your heartbeats, with your mind (biofeedback).  Yoga.  Meditation.  Physical activity such as swimming, jogging, or walking.  Get plenty of rest and sleep. SEEK MEDICAL CARE IF:   You continue to have a fast or irregular heartbeat beyond 24 hours.  Your palpitations occur more often. SEEK IMMEDIATE MEDICAL CARE IF:  You have chest pain or shortness of breath.  You have a severe headache.  You feel dizzy or you faint. MAKE SURE YOU:  Understand these instructions.  Will watch your condition.  Will get help right away if you are not doing well or get worse.   This information is not intended to replace advice given to you by your health care provider. Make sure you discuss any questions you have with your health care provider.   Document Released: 09/25/2000 Document Revised: 10/03/2013 Document Reviewed: 11/27/2011 Elsevier Interactive Patient Education [employer].

## 2016-03-29 DIAGNOSIS — Z452 Encounter for adjustment and management of vascular access device: Secondary | ICD-10-CM | POA: Diagnosis not present

## 2016-03-31 ENCOUNTER — Encounter (HOSPITAL_COMMUNITY): Payer: Self-pay | Admitting: Dentistry

## 2016-03-31 ENCOUNTER — Ambulatory Visit (HOSPITAL_COMMUNITY): Payer: Self-pay | Admitting: Dentistry

## 2016-03-31 VITALS — BP 138/94 | HR 115 | Temp 98.9°F

## 2016-03-31 DIAGNOSIS — K030 Excessive attrition of teeth: Secondary | ICD-10-CM

## 2016-03-31 DIAGNOSIS — K045 Chronic apical periodontitis: Secondary | ICD-10-CM

## 2016-03-31 DIAGNOSIS — K08409 Partial loss of teeth, unspecified cause, unspecified class: Secondary | ICD-10-CM

## 2016-03-31 DIAGNOSIS — J0100 Acute maxillary sinusitis, unspecified: Secondary | ICD-10-CM

## 2016-03-31 NOTE — Progress Notes (Signed)
03/31/2016  Patient Name:   Alexis [name] Date of Birth:   [birth year] Medical Record Number: [id number]  BP 138/94 mmHg  Pulse 115  Temp(Src) 98.9 F (37.2 C) (Oral)  Alexis [name] is a 30 year old male that presents for limited oral examination and radiographs as indicated.   Chief complaint:  Patient referred by Dr. Ernesto Rutherford for a dental evaluation and to discuss possible dental extraction.  HPI:  Alexis [name] is a 30 year old with a history of acute on chronic maxillary sinusitis with questionable dental comorbidities.  Patient had previous root canal therapy associated with tooth #3 approximately 8-10 years ago with Dr. Mike Gip in [place], [place]. Patient indicates that he never had a permanent crown placed. Patient subsequently presented to Dr. Curlene Dolphin in [place], [place] in January 2017 for his exam and cleaning appointment. At that point time periapical pathology was noted to be affecting tooth #3 and patient was referred to Dr. Huntley Dec in [place], [place] for evaluation for retreatment of the previous root canal therapy. The retreatment was performed on 02/13/2016. Patient then followed up with Dr. Curlene Dolphin about 2 weeks later for crown preparation and impression for new crown and subsequent fabrication of a new temporary restoration. Patient was then reevaluated by Dr. Christiane Ha Mohorn approximately one day later with a history of "fullness" but was negative to percussion and palpation on tooth #3. Patient was subsequently placed on clindamycin oral antibiotic therapy taking 150 mg every 8 hours. Patient subsequently woke up the next day with a "sore throat".  Patient completed the antibiotic therapy but developed continued fullness in the right soft palate sinus area with possible uvula enlargement. Patient was then again seen by Dr. Loyal Gambler who did not feel that the tooth was contributing to these types of symptoms and recommended further follow-up with  possible infectious disease or ear, nose, and throat specialist. The patient saw Dr. Riki Altes who obtained a CT scan on 03/26/2016 that revealed ethmoid sinus and right maxillary sinus mucosal edema. The patient subsequently saw Dr. Bobby Rumpf on 03/27/2016 who suggested IV Invanz antibiotic therapy daily for 3 weeks.  An IV PICC line was placed and patient started his IV antibiotic therapy on 03/28/2016.  Since that time the patient's symptoms have improved. The "fullness" is better by patient report. The uvula swelling has resolved. There is no toothache symptoms or percussion or palpation sensitivity. His voice quality has returned to normal by his report. His cough is still present but is resolving and the patient indicates that he feels "better than I have been a long time ".   Medical Hx Update:  Past Medical History  Diagnosis Date  . ALLERGIC RHINITIS 06/10/2009  . ADD 09/13/2007  . ASTHMA, UNSPECIFIED, UNSPECIFIED STATUS 07/19/2008  . DEPRESSIVE DISORDER 09/13/2007  . DYSMETABOLIC SYNDROME 0000000  . GERD 09/13/2007  . HYPERLIPIDEMIA 10/10/2009  . HYPERTENSION 09/13/2007  . IBS 03/20/2009  . Morbid obesity ([place]) 07/19/2008  . Skin cancer   . Acid reflux   .  Past Surgical History  Procedure Laterality Date  . Appendectomy  2005  . Ankle surgery  2007  . Skin cancer excision      teenager    ALLERGIES/ADVERSE DRUG REACTIONS: Allergies  Allergen Reactions  . Citrus Anaphylaxis and Other (See Comments)    Lemon  . Amoxicillin-Pot Clavulanate     nausea  . Sulfamethoxazole-Trimethoprim Other (See Comments)    Does not remember, long ago  MEDICATIONS: Current Outpatient Prescriptions  Medication Sig Dispense Refill  . ALPRAZolam (XANAX) 1 MG tablet Take 1 mg by mouth 2 (two) times daily as needed for anxiety.    Marland Kitchen buPROPion (WELLBUTRIN) 75 MG tablet Take 1 tablet (75 mg total) by mouth daily. 90 tablet 3  . Ertapenem Sodium (INVANZ IV) Inject into the vein daily.  Thru PICC line    . losartan (COZAAR) 100 MG tablet Take 1 tablet (100 mg total) by mouth daily. 90 tablet 2  . omeprazole (PRILOSEC) 40 MG capsule TAKE 1 CAPSULE BY MOUTH TWICE A DAY 180 capsule 3  . topiramate (TOPAMAX) 50 MG tablet Take 1 tablet (50 mg total) by mouth 2 (two) times daily. 60 tablet 11  . verapamil (CALAN-SR) 120 MG CR tablet Take 1 tablet by mouth daily.  2  . celecoxib (CELEBREX) 200 MG capsule TAKE ONE CAPSULE BY MOUTH EVERY DAY 90 capsule 3  . EPINEPHrine (EPI-PEN) 0.3 mg/0.3 mL DEVI Inject 0.3 mLs (0.3 mg total) into the muscle once. 1 Device prn  . Nutritional Supplements (JUICE PLUS FIBRE PO) Take 1 capsule by mouth daily.     No current facility-administered medications for this visit.    DENTAL EXAM: General: The patient is a well-developed, well-nourished male in no acute distress. Vitals: BP 138/94 mmHg  Pulse 115  Temp(Src) 98.9 F (37.2 C) (Oral) Extraoral Exam: There is no palpable submandibular lymphadenopathy. The patient denies acute TMJ symptoms. Patient has a maximum interincisal opening of 40 mm. Intraoral  Exam: Patient has normal saliva. I do not see any evidence of oral abscess formation. Patient does have a 2x2 mm round erythematous ulceration near the right post dam area of the soft palate. The uvula appears to be normal. Dentition: Patient is missing tooth numbers 1, 16, 17, and 32. These were extracted when he was 60-24 years of age. Periodontal: Patient has chronic gingivitis with no significant tooth mobility noted. Caries:  No obvious dental caries are noted. Endodontic:  The patient currently denies acute pulpitis symptoms.  There is a persistent periapical radiolucency around the mesial root consistent with recent retreatment with expected bone changes to occur over the next 6 months. pulpitits symptoms.  C&B: Patient has a temporary crown on tooth #3 with definitive restoration to be placed this Thursday with Dr. Thereasa Parkin Prosthodontic: No  partial dentures. Occlusion:  Patient has a poor occlusal scheme but a stable occlusion at this time. Patient has a crossbite in the area of tooth numbers 10/22. There is evidence of occlusal attrition with questionable erosion component. Patient has been on proton pump inhibitors since age 89 by his report. Patient is unsure if he has a bruxism/clenching problem. Radiographic Interpretation: Orthopantogram was taken and supplemented the peri-per radius area #3. Patient is missing tooth numbers 1, 16, 17 and 32. Tooth #3 is a root canal therapy that appears to be acceptable. There is a persistent periapical radiolucency of the mesial root with expected bone changes to occur over the next 6 months with healing.  Multiple dental resin restorations are noted.  Assessments: 1. History of acute on chronic sinusitis 2. History of chronic apical periodontitis involving tooth #3 with retreatment on 02/13/2016 with Dr. Christiane Ha Mohorn (endodontist) 3. No evidence of percussion or palpation sensitivity involving tooth #3. No toothache symptoms. 4. Occlusal wear with possible erosion component 5. Questionable history of clenching/bruxism. 6. Missing wisdom teeth numbers 1, 16, 17 and 32.  7. 2 mm round erythematous ulceration involving the soft palate  dear the right post dam area - (questionable previous viral involvement)  Plan/recommendations:  1.  We discussed the complex history and probable multifactorial involvement of acute and chronic sinusitis, previous periapical pathology and endodontic retreatment, possible viral infection, or other possible infection process. We discussed current asymptomatic tooth #3 with percussion and palpation negative assessments and no toothache symptoms. We discussed need for approximately 6 months to radiographically visualize the healing involving recent endodontic retreatment of tooth #3. We discussed the plan to continue current IV antibiotic therapy with the Invanz for the  3 weeks duration as prescribed by Dr. Johnnye Sima.  We discussed the future insertion of the permanent crown on tooth #3 on 04/02/2016 with Dr. Thereasa Parkin if this is acceptable with Dr. Huntley Dec. We then discussed the extraction of tooth #3 and resultant need for bridge therapy from tooth numbers 2 through 4 OR implant therapy with crown after probable sinus lift procedure.  The patient currently wishes to defer having the tooth extracted at this time. Patient is aware that patient will need to be referred to an oral surgeon for this extraction if he does need this treatment in the future. Per discussion with Dr. Huntley Dec, it is acceptable to proceed with insertion of the final crown on tooth #3 at this time with special emphasis by Dr. Thereasa Parkin to ensure no hyperocclusion on #3 at the time of insertion.  Patient will also follow up with Dr. Thereasa Parkin for evaluation for occlusal splint therapy as indicated in the future. Patient is planning to leave for the beach this weekend and is aware that he needs to follow-up with medical and dental team members if acute problems arise during this vacation time. Patient may contact dental medicine as needed if additional questions arise.  2. Discussion of findings with medical and dental team members as indicated.   Lenn Cal, DDS

## 2016-03-31 NOTE — Patient Instructions (Signed)
Plan/recommendations:  1.  We discussed the complex history and probable multifactorial involvement of acute and chronic sinusitis, previous periapical pathology and endodontic retreatment, possible viral infection, or other possible infection process. We discussed current asymptomatic tooth #3 with percussion and palpation negative assessments and no toothache symptoms. We discussed need for approximately 6 months to radiographically visualize the healing involving recent endodontic retreatment of tooth #3. We discussed the plan to continue current IV antibiotic therapy with the Invanz for the 3 weeks duration as prescribed by Dr. Johnnye Sima.  We discussed the future insertion of the permanent crown on tooth #3 on 04/02/2016 with Dr. Thereasa Parkin if this is acceptable with Dr. Huntley Dec. We then discussed the extraction of tooth #3 and resultant need for bridge therapy from tooth numbers 2 through 4 OR implant therapy with crown after probable sinus lift procedure.  The patient currently wishes to defer having the tooth extracted at this time. Patient is aware that patient will need to be referred to an oral surgeon for this extraction if he does need this treatment in the future. Per discussion with Dr. Huntley Dec, it is acceptable to proceed with insertion of the final crown on tooth #3 at this time with special emphasis by Dr. Thereasa Parkin to ensure no hyperocclusion on #3 at the time of insertion.  Patient will also follow up with Dr. Thereasa Parkin for evaluation for occlusal splint therapy as indicated in the future. Patient is planning to leave for the beach this weekend and is aware that he needs to follow-up with medical and dental team members if acute problems arise during this vacation time. Patient may contact dental medicine as needed if additional questions arise.  Dr. Enrique Sack

## 2016-04-01 DIAGNOSIS — M542 Cervicalgia: Secondary | ICD-10-CM | POA: Diagnosis not present

## 2016-04-01 DIAGNOSIS — M545 Low back pain: Secondary | ICD-10-CM | POA: Diagnosis not present

## 2016-04-01 DIAGNOSIS — M9901 Segmental and somatic dysfunction of cervical region: Secondary | ICD-10-CM | POA: Diagnosis not present

## 2016-04-01 DIAGNOSIS — M9903 Segmental and somatic dysfunction of lumbar region: Secondary | ICD-10-CM | POA: Diagnosis not present

## 2016-04-02 ENCOUNTER — Telehealth: Payer: Self-pay | Admitting: *Deleted

## 2016-04-02 DIAGNOSIS — Z452 Encounter for adjustment and management of vascular access device: Secondary | ICD-10-CM | POA: Diagnosis not present

## 2016-04-02 DIAGNOSIS — K047 Periapical abscess without sinus: Secondary | ICD-10-CM | POA: Diagnosis not present

## 2016-04-02 LAB — CULTURE, BLOOD (SINGLE)
ORGANISM ID, BACTERIA: NO GROWTH
ORGANISM ID, BACTERIA: NO GROWTH

## 2016-04-02 NOTE — Telephone Encounter (Signed)
RN explained that culture results take longer than other results to become available.  Also, Dr. Johnnye Sima needs to review the results to make them visible or they become visible after approximately 4 days from the time they are complete.  The patient's mother verbalized understanding.  The patient has been trying to see these results in [place].

## 2016-04-03 ENCOUNTER — Telehealth: Payer: Self-pay | Admitting: *Deleted

## 2016-04-03 DIAGNOSIS — J329 Chronic sinusitis, unspecified: Secondary | ICD-10-CM | POA: Diagnosis not present

## 2016-04-03 NOTE — Telephone Encounter (Signed)
Kara from Dr. Minna Merritts called for path reports, office notes faxed to their office, (referring MD). Office note faxed

## 2016-04-06 DIAGNOSIS — R21 Rash and other nonspecific skin eruption: Secondary | ICD-10-CM | POA: Diagnosis not present

## 2016-04-06 DIAGNOSIS — T8090XA Unspecified complication following infusion and therapeutic injection, initial encounter: Secondary | ICD-10-CM | POA: Diagnosis not present

## 2016-04-06 DIAGNOSIS — F419 Anxiety disorder, unspecified: Secondary | ICD-10-CM | POA: Diagnosis not present

## 2016-04-06 DIAGNOSIS — K219 Gastro-esophageal reflux disease without esophagitis: Secondary | ICD-10-CM | POA: Diagnosis not present

## 2016-04-11 ENCOUNTER — Encounter (HOSPITAL_COMMUNITY): Payer: Self-pay | Admitting: *Deleted

## 2016-04-11 ENCOUNTER — Inpatient Hospital Stay (HOSPITAL_COMMUNITY)
Admission: EM | Admit: 2016-04-11 | Discharge: 2016-04-15 | DRG: 315 | Disposition: A | Discharge status: Home Health | Payer: BLUE CROSS/BLUE SHIELD | Attending: Internal Medicine | Admitting: Internal Medicine

## 2016-04-11 DIAGNOSIS — I10 Essential (primary) hypertension: Secondary | ICD-10-CM | POA: Diagnosis not present

## 2016-04-11 DIAGNOSIS — Z6841 Body Mass Index (BMI) 40.0 and over, adult: Secondary | ICD-10-CM | POA: Diagnosis not present

## 2016-04-11 DIAGNOSIS — F411 Generalized anxiety disorder: Secondary | ICD-10-CM | POA: Diagnosis not present

## 2016-04-11 DIAGNOSIS — T827XXA Infection and inflammatory reaction due to other cardiac and vascular devices, implants and grafts, initial encounter: Secondary | ICD-10-CM

## 2016-04-11 DIAGNOSIS — K219 Gastro-esophageal reflux disease without esophagitis: Secondary | ICD-10-CM | POA: Diagnosis present

## 2016-04-11 DIAGNOSIS — J0100 Acute maxillary sinusitis, unspecified: Secondary | ICD-10-CM | POA: Diagnosis not present

## 2016-04-11 DIAGNOSIS — E785 Hyperlipidemia, unspecified: Secondary | ICD-10-CM | POA: Diagnosis not present

## 2016-04-11 DIAGNOSIS — Z85828 Personal history of other malignant neoplasm of skin: Secondary | ICD-10-CM

## 2016-04-11 DIAGNOSIS — Y848 Other medical procedures as the cause of abnormal reaction of the patient, or of later complication, without mention of misadventure at the time of the procedure: Secondary | ICD-10-CM | POA: Diagnosis present

## 2016-04-11 DIAGNOSIS — R079 Chest pain, unspecified: Secondary | ICD-10-CM | POA: Diagnosis not present

## 2016-04-11 DIAGNOSIS — T80211A Bloodstream infection due to central venous catheter, initial encounter: Secondary | ICD-10-CM | POA: Diagnosis not present

## 2016-04-11 DIAGNOSIS — B9562 Methicillin resistant Staphylococcus aureus infection as the cause of diseases classified elsewhere: Secondary | ICD-10-CM | POA: Diagnosis present

## 2016-04-11 DIAGNOSIS — F329 Major depressive disorder, single episode, unspecified: Secondary | ICD-10-CM | POA: Diagnosis present

## 2016-04-11 DIAGNOSIS — Z79899 Other long term (current) drug therapy: Secondary | ICD-10-CM | POA: Diagnosis not present

## 2016-04-11 DIAGNOSIS — J45909 Unspecified asthma, uncomplicated: Secondary | ICD-10-CM | POA: Diagnosis present

## 2016-04-11 DIAGNOSIS — K047 Periapical abscess without sinus: Secondary | ICD-10-CM | POA: Diagnosis present

## 2016-04-11 DIAGNOSIS — R7881 Bacteremia: Secondary | ICD-10-CM | POA: Diagnosis present

## 2016-04-11 DIAGNOSIS — Z792 Long term (current) use of antibiotics: Secondary | ICD-10-CM

## 2016-04-11 DIAGNOSIS — A4901 Methicillin susceptible Staphylococcus aureus infection, unspecified site: Secondary | ICD-10-CM

## 2016-04-11 DIAGNOSIS — Z452 Encounter for adjustment and management of vascular access device: Secondary | ICD-10-CM | POA: Diagnosis not present

## 2016-04-11 LAB — CBC WITH DIFFERENTIAL/PLATELET
BASOS ABS: 0 10*3/uL (ref 0.0–0.1)
Basophils Relative: 0 %
EOS PCT: 4 %
Eosinophils Absolute: 0.3 10*3/uL (ref 0.0–0.7)
HEMATOCRIT: 42.2 % (ref 39.0–52.0)
Hemoglobin: 13.8 g/dL (ref 13.0–17.0)
LYMPHS ABS: 2.6 10*3/uL (ref 0.7–4.0)
LYMPHS PCT: 29 %
MCH: 26.5 pg (ref 26.0–34.0)
MCHC: 32.7 g/dL (ref 30.0–36.0)
MCV: 81 fL (ref 78.0–100.0)
MONO ABS: 0.7 10*3/uL (ref 0.1–1.0)
Monocytes Relative: 8 %
NEUTROS ABS: 5.3 10*3/uL (ref 1.7–7.7)
Neutrophils Relative %: 59 %
PLATELETS: 235 10*3/uL (ref 150–400)
RBC: 5.21 MIL/uL (ref 4.22–5.81)
RDW: 14.1 % (ref 11.5–15.5)
WBC: 8.9 10*3/uL (ref 4.0–10.5)

## 2016-04-11 LAB — COMPREHENSIVE METABOLIC PANEL
ALT: 34 U/L (ref 17–63)
ANION GAP: 6 (ref 5–15)
AST: 26 U/L (ref 15–41)
Albumin: 3.3 g/dL — ABNORMAL LOW (ref 3.5–5.0)
Alkaline Phosphatase: 65 U/L (ref 38–126)
BUN: 10 mg/dL (ref 6–20)
CHLORIDE: 108 mmol/L (ref 101–111)
CO2: 24 mmol/L (ref 22–32)
Calcium: 8.8 mg/dL — ABNORMAL LOW (ref 8.9–10.3)
Creatinine, Ser: 1.08 mg/dL (ref 0.61–1.24)
GFR calc Af Amer: >60 mL/min (ref >60)
GFR calc non Af Amer: >60 mL/min (ref >60)
GLUCOSE: 100 mg/dL — AB (ref 65–99)
POTASSIUM: 3.6 mmol/L (ref 3.5–5.1)
SODIUM: 138 mmol/L (ref 135–145)
Total Bilirubin: 1.1 mg/dL (ref 0.3–1.2)
Total Protein: 6.7 g/dL (ref 6.5–8.1)

## 2016-04-11 MED ORDER — FAMOTIDINE 20 MG PO TABS
20.0000 mg | ORAL_TABLET | Freq: Two times a day (BID) | ORAL | Status: DC
  Administered 2016-04-12 – 2016-04-14 (×6): 20 mg via ORAL
  Filled 2016-04-11 (×7): qty 1

## 2016-04-11 MED ORDER — LOSARTAN POTASSIUM 50 MG PO TABS
100.0000 mg | ORAL_TABLET | Freq: Every day | ORAL | Status: DC
  Administered 2016-04-12 – 2016-04-15 (×4): 100 mg via ORAL
  Filled 2016-04-11 (×4): qty 2

## 2016-04-11 MED ORDER — VANCOMYCIN HCL 10 G IV SOLR
2500.0000 mg | Freq: Once | INTRAVENOUS | Status: AC
  Administered 2016-04-11: 2500 mg via INTRAVENOUS
  Filled 2016-04-11: qty 2500

## 2016-04-11 MED ORDER — DIPHENHYDRAMINE HCL 25 MG PO CAPS
25.0000 mg | ORAL_CAPSULE | Freq: Two times a day (BID) | ORAL | Status: DC
  Administered 2016-04-12 – 2016-04-15 (×7): 25 mg via ORAL
  Filled 2016-04-11 (×7): qty 1

## 2016-04-11 MED ORDER — BUPROPION HCL 75 MG PO TABS
75.0000 mg | ORAL_TABLET | Freq: Every day | ORAL | Status: DC
  Administered 2016-04-12 – 2016-04-15 (×4): 75 mg via ORAL
  Filled 2016-04-11 (×5): qty 1

## 2016-04-11 MED ORDER — CELECOXIB 200 MG PO CAPS
200.0000 mg | ORAL_CAPSULE | Freq: Every day | ORAL | Status: DC | PRN
  Filled 2016-04-11: qty 1

## 2016-04-11 MED ORDER — PANTOPRAZOLE SODIUM 40 MG PO TBEC
80.0000 mg | DELAYED_RELEASE_TABLET | Freq: Every day | ORAL | Status: DC
  Administered 2016-04-12 – 2016-04-15 (×4): 80 mg via ORAL
  Filled 2016-04-11 (×5): qty 2

## 2016-04-11 MED ORDER — ALPRAZOLAM 0.5 MG PO TABS: 1.0000 mg | ORAL_TABLET | Freq: Two times a day (BID) | ORAL | Status: DC | PRN

## 2016-04-11 MED ORDER — EPINEPHRINE 0.3 MG/0.3ML IJ SOAJ
0.3000 mg | Freq: Once | INTRAMUSCULAR | Status: DC | PRN
  Filled 2016-04-11: qty 0.3

## 2016-04-11 MED ORDER — ENOXAPARIN SODIUM 100 MG/ML ~~LOC~~ SOLN
90.0000 mg | SUBCUTANEOUS | Status: DC
  Administered 2016-04-12 – 2016-04-15 (×4): 90 mg via SUBCUTANEOUS
  Filled 2016-04-11 (×4): qty 1

## 2016-04-11 MED ORDER — DIPHENHYDRAMINE HCL 50 MG/ML IJ SOLN
25.0000 mg | Freq: Once | INTRAMUSCULAR | Status: AC
  Administered 2016-04-11: 25 mg via INTRAVENOUS
  Filled 2016-04-11: qty 1

## 2016-04-11 MED ORDER — SODIUM CHLORIDE 0.9 % IV SOLN: 1.0000 g | INTRAVENOUS | Status: DC

## 2016-04-11 MED ORDER — VERAPAMIL HCL ER 240 MG PO TBCR
120.0000 mg | EXTENDED_RELEASE_TABLET | Freq: Every day | ORAL | Status: DC
  Administered 2016-04-12 – 2016-04-13 (×2): 120 mg via ORAL
  Filled 2016-04-11: qty 1
  Filled 2016-04-11: qty 0.5

## 2016-04-11 MED ORDER — VANCOMYCIN HCL 10 G IV SOLR
1500.0000 mg | Freq: Three times a day (TID) | INTRAVENOUS | Status: DC
  Administered 2016-04-12 – 2016-04-14 (×7): 1500 mg via INTRAVENOUS
  Filled 2016-04-11 (×12): qty 1500

## 2016-04-11 MED ORDER — FAMOTIDINE IN NACL 20-0.9 MG/50ML-% IV SOLN
20.0000 mg | Freq: Once | INTRAVENOUS | Status: AC
  Administered 2016-04-11: 20 mg via INTRAVENOUS
  Filled 2016-04-11: qty 50

## 2016-04-11 MED ORDER — TOPIRAMATE 25 MG PO TABS
50.0000 mg | ORAL_TABLET | Freq: Two times a day (BID) | ORAL | Status: DC
  Administered 2016-04-12 – 2016-04-14 (×6): 50 mg via ORAL
  Filled 2016-04-11 (×8): qty 2

## 2016-04-11 MED ORDER — SODIUM CHLORIDE 0.9 % IV SOLN
1.0000 g | Freq: Every day | INTRAVENOUS | Status: DC
  Administered 2016-04-11 – 2016-04-14 (×4): 1 g via INTRAVENOUS
  Filled 2016-04-11 (×5): qty 1

## 2016-04-11 NOTE — ED Notes (Signed)
Per DR. Haviland, to flush benadryl and start pepcid after vanc is finished.

## 2016-04-11 NOTE — ED Notes (Signed)
Per Dr. Alcario Drought to slow down vanc until patient is comfortable.

## 2016-04-11 NOTE — ED Provider Notes (Signed)
CSN: [id number]     Arrival date & time 04/11/16  1752 History   First MD Initiated Contact with Patient 04/11/16 1926     Chief Complaint  Patient presents with  . abnormal labs    PT IS A 30 YO WM WITH A HX OF A SEVERE SINUS INFECTION REQUIRING PICC LINE WITH INVANZ.  THE PT [place] THIS [place] AND NOTICED A RASH AROUND HIS PICC LINE.  HE WENT TO THE ED WHILE THERE AND THEY DID BLOOD CULTURES.  PT CAME HOME FROM THE BEACH TODAY AND A LETTER WAS IN THE MAIL SAYING THAT HE HAD + BLOOD CULTURES FROM 6/26.  PT IS UNDER THE CARE OF ID AND CALLED ID WHO TOLD HIM TO COME HERE.  (Consider location/radiation/quality/duration/timing/severity/associated sxs/prior Treatment) The history is provided by the patient.    Past Medical History  Diagnosis Date  . ALLERGIC RHINITIS 06/10/2009  . ADD 09/13/2007  . ASTHMA, UNSPECIFIED, UNSPECIFIED STATUS 07/19/2008  . DEPRESSIVE DISORDER 09/13/2007  . DYSMETABOLIC SYNDROME 0000000  . GERD 09/13/2007  . HYPERLIPIDEMIA 10/10/2009  . HYPERTENSION 09/13/2007  . IBS 03/20/2009  . Morbid obesity ([place]) 07/19/2008  . Skin cancer   . Acid reflux    Past Surgical History  Procedure Laterality Date  . Appendectomy  2005  . Ankle surgery  2007  . Skin cancer excision      teenager   Family History  Problem Relation Age of Onset  . Hypertension Father   . Diabetes Father     type 2   . Emphysema Maternal Grandmother     smoker  . Asthma Maternal Grandmother   . Lung cancer Maternal Grandmother   . Allergies      whole family-both sides of family  . Asthma Mother   . Clotting disorder Mother     Factor 5  . Rheum arthritis Mother   . Skin cancer Mother   . Asthma Paternal Grandmother   . Diabetes Paternal Grandmother     type 2  . Heart disease      MGM deceased with MI; both sides of grandparents  . Clotting disorder Maternal Uncle     Factor 5   Social History  Substance Use Topics  . Smoking status: Never Smoker   . Smokeless tobacco:  Never Used  . Alcohol Use: No     Comment: socially    Review of Systems  Skin: Positive for rash.  All other systems reviewed and are negative.     Allergies  Citrus; Amoxicillin-pot clavulanate; Sulfamethoxazole-trimethoprim; and Vancomycin  Home Medications   Prior to Admission medications   Medication Sig Start Date End Date Taking? Authorizing Provider  ALPRAZolam Duanne Moron) 1 MG tablet Take 1 mg by mouth 2 (two) times daily as needed for anxiety.   Yes Historical Provider, MD  buPROPion (WELLBUTRIN) 75 MG tablet Take 1 tablet (75 mg total) by mouth daily. 09/09/15  Yes Janith Lima, MD  celecoxib (CELEBREX) 200 MG capsule TAKE ONE CAPSULE BY MOUTH EVERY DAY Patient taking differently: Take 200 mg by mouth daily as needed for moderate pain.  09/09/15  Yes Janith Lima, MD  diphenhydrAMINE (BENADRYL) 25 MG tablet Take 25 mg by mouth 2 (two) times daily.   Yes Historical Provider, MD  famotidine (PEPCID) 20 MG tablet Take 20 mg by mouth 2 (two) times daily. 04/07/16  Yes Historical Provider, MD  losartan (COZAAR) 100 MG tablet Take 1 tablet (100 mg total) by mouth daily.  09/09/15  Yes Janith Lima, MD  Nutritional Supplements (JUICE PLUS FIBRE PO) Take 1 capsule by mouth daily.   Yes Historical Provider, MD  omeprazole (PRILOSEC) 40 MG capsule TAKE 1 CAPSULE BY MOUTH TWICE A DAY 09/09/15  Yes Janith Lima, MD  topiramate (TOPAMAX) 50 MG tablet Take 1 tablet (50 mg total) by mouth 2 (two) times daily. 05/16/15  Yes Melvenia Beam, MD  verapamil (CALAN-SR) 120 MG CR tablet Take 1 tablet by mouth daily. 03/12/16  Yes Historical Provider, MD  EPINEPHrine (EPI-PEN) 0.3 mg/0.3 mL DEVI Inject 0.3 mLs (0.3 mg total) into the muscle once. 02/23/13   Deneise Lever, MD  Ertapenem Sodium (INVANZ IV) Inject into the vein daily. Reported on 04/11/2016 03/28/16 04/18/16  Historical Provider, MD   BP 123/80 mmHg  Pulse 86  Temp(Src) 98.8 F (37.1 C) (Oral)  Resp 17  Ht 6\' 6"  (1.981 m)  Wt 405  lb (183.707 kg)  BMI 46.81 kg/m2  SpO2 98% Physical Exam  Constitutional: He is oriented to person, place, and time. He appears well-developed and well-nourished.  HENT:  Head: Normocephalic and atraumatic.  Right Ear: External ear normal.  Left Ear: External ear normal.  Nose: Nose normal.  Mouth/Throat: Oropharynx is clear and moist.  Eyes: Conjunctivae and EOM are normal. Pupils are equal, round, and reactive to light.  Neck: Normal range of motion. Neck supple.  Cardiovascular: Normal rate, regular rhythm, normal heart sounds and intact distal pulses.   Pulmonary/Chest: Effort normal and breath sounds normal.  Abdominal: Soft. Bowel sounds are normal.  Musculoskeletal: Normal range of motion.  Neurological: He is alert and oriented to person, place, and time.  Skin: Rash noted.     Psychiatric: He has a normal mood and affect. His behavior is normal. Judgment and thought content normal.  Nursing note and vitals reviewed.   ED Course  Procedures (including critical care time) Labs Review Labs Reviewed  COMPREHENSIVE METABOLIC PANEL - Abnormal; Notable for the following:    Glucose, Bld 100 (*)    Calcium 8.8 (*)    Albumin 3.3 (*)    All other components within normal limits  CULTURE, BLOOD (ROUTINE X 2)  CULTURE, BLOOD (ROUTINE X 2)  CBC WITH DIFFERENTIAL/PLATELET    Imaging Review No results found. I have personally reviewed and evaluated these images and lab results as part of my medical decision-making.   EKG Interpretation None      MDM  PT D/W DR. COMER (ID) ON FOR DR. HATCHER.  HE REC THAT WE START VANCO, PULL THE PICC LINE, RECULTURE, ECHO, AND ADMIT.  PT D/W DR. GARDNER ([facility]) WHO WILL ADMIT PT.  Final diagnoses:  Bacteremia      Isla Pence, MD 04/12/16 1601

## 2016-04-11 NOTE — ED Notes (Signed)
The pt is here with  A rash from the picc line in his rt a-c.  He has a picc line for dental abscess.  He was at the beach and wnet to an ed for the rash around the picc line  He had blood cultures and he was notified that he had positive blood culture with mersa.  He just got back from the beach today and found the letter about his pos blood cultures.

## 2016-04-11 NOTE — Progress Notes (Signed)
ANTIBIOTIC CONSULT NOTE - INITIAL  Pharmacy Consult for Vancomycin Indication: bacteremia  Allergies  Allergen Reactions  . Citrus Anaphylaxis and Other (See Comments)    Lemon  . Amoxicillin-Pot Clavulanate Nausea Only  . Sulfamethoxazole-Trimethoprim Other (See Comments)    Does not remember, long ago    Patient Measurements: Height: 6\' 6"  (198.1 cm) Weight: (!) 403 lb (182.8 kg) IBW/kg (Calculated) : 91.4 Adjusted Body Weight:    Vital Signs: Temp: 98.1 F (36.7 C) (07/01 1803) BP: 129/92 mmHg (07/01 2003) Pulse Rate: 85 (07/01 2003) Intake/Output from previous day:   Intake/Output from this shift:    Labs:  Recent Labs  04/11/16 1954  WBC 8.9  HGB 13.8  PLT 235  CREATININE 1.08   Estimated Creatinine Clearance: 181.1 mL/min (by C-G formula based on Cr of 1.08). No results for input(s): VANCOTROUGH, VANCOPEAK, VANCORANDOM, GENTTROUGH, GENTPEAK, GENTRANDOM, TOBRATROUGH, TOBRAPEAK, TOBRARND, AMIKACINPEAK, AMIKACINTROU, AMIKACIN in the last 72 hours.   Microbiology: Recent Results (from the past 720 hour(s))  Culture, blood (single) w Reflex to ID Panel     Status: None   Collection Time: 03/27/16 11:00 AM  Result Value Ref Range Status   Organism ID, Bacteria NO GROWTH 5 DAYS  Final    Comment: Culture results may be compromised due to an excessive volume of blood received in culture bottles.   Culture, blood (single) w Reflex to ID Panel     Status: None   Collection Time: 03/27/16 11:01 AM  Result Value Ref Range Status   Organism ID, Bacteria NO GROWTH 5 DAYS  Final    Comment: Culture results may be compromised due to an excessive volume of blood received in culture bottles.     Medical History: Past Medical History  Diagnosis Date  . ALLERGIC RHINITIS 06/10/2009  . ADD 09/13/2007  . ASTHMA, UNSPECIFIED, UNSPECIFIED STATUS 07/19/2008  . DEPRESSIVE DISORDER 09/13/2007  . DYSMETABOLIC SYNDROME 0000000  . GERD 09/13/2007  . HYPERLIPIDEMIA  10/10/2009  . HYPERTENSION 09/13/2007  . IBS 03/20/2009  . Morbid obesity ([place]) 07/19/2008  . Skin cancer   . Acid reflux     Medications:  F/u MAR  Assessment: 30 yo presenting with redness around PICC line (Picc line for dental abscess, ertapenem x 3 weeks per [facility]cher, started 6/17) - rash at beach around PICC line and he had + Bc with MRSA. Baseline labs WNL  PMH: asthma, chronic maxillary sinusitis, poor dental hygiene  ID: abx for MRSA bacteremia. WBC 8.9. Plan PICC removal, TTE, repeat BC  Renal: Scr 1.08   Goal of Therapy:  Vancomycin trough level 15-20 mcg/ml  Plan:  Vancomycin 2500 g x 1>>Redman's reaction Vancomycin 1500mg  IV q8hr Check Vanco trough after 3-5 doses at steady state.   Casson Catena S. Alford Highland, PharmD, BCPS Clinical Staff Pharmacist Pager [place], [place] 04/11/2016,9:05 PM

## 2016-04-11 NOTE — Consult Note (Signed)
CHAMP autoconsult note:  He has a report of MRSA blood cultures (report not available to me but called for verbal result to home health nurse) at an outside lab at a [facility] facility [place].  He received a letter about GPC in blood.  Therefore, I had patient sent in for admission and work up to include picc line removal, vancomycin, TTE, repeat blood cultures now (may still be positive) and after 48 hours.   Full consult to follow tomorrow.   Thanks  Alexis [name], [employer]

## 2016-04-11 NOTE — H&P (Signed)
History and Physical    PROSPERO PIERSALL [id number] DOB: [birth year] DOA: 04/11/2016   PCP: Scarlette Calico, MD Chief Complaint:  Chief Complaint  Patient presents with  . abnormal labs     HPI: Alexis [name] is a 30 y.o. male with medical history significant of dental abscess being treated with 3 weeks of invanz scheduled to end on 7/8.  Patient had rash around PICC line [place] at beach [place], got seen in ED there.  Cultures drawn from PICC.  Patient got home today to mailbox and had letter that cultures were positive for GPC in C.  Dr. Linus Salmons called hospital [place] and confirmed that cultures were growing MRSA.  Patient sent in to ED.  ED Course: PICC line has already been pulled in ED (unable to culture tip), BCx x2 from different sites done.  Started on vancomycin.  Dr. Linus Salmons wants admit, 2d echo, and he will put consult on patient tomorrow.  Review of Systems: As per HPI otherwise 10 point review of systems negative.    Past Medical History  Diagnosis Date  . ALLERGIC RHINITIS 06/10/2009  . ADD 09/13/2007  . ASTHMA, UNSPECIFIED, UNSPECIFIED STATUS 07/19/2008  . DEPRESSIVE DISORDER 09/13/2007  . DYSMETABOLIC SYNDROME 0000000  . GERD 09/13/2007  . HYPERLIPIDEMIA 10/10/2009  . HYPERTENSION 09/13/2007  . IBS 03/20/2009  . Morbid obesity ([place]) 07/19/2008  . Skin cancer   . Acid reflux     Past Surgical History  Procedure Laterality Date  . Appendectomy  2005  . Ankle surgery  2007  . Skin cancer excision      teenager     reports that he has never smoked. He has never used smokeless tobacco. He reports that he does not drink alcohol or use illicit drugs.  Allergies  Allergen Reactions  . Citrus Anaphylaxis and Other (See Comments)    Lemon  . Amoxicillin-Pot Clavulanate Nausea Only  . Sulfamethoxazole-Trimethoprim Other (See Comments)    Does not remember, long ago    Family History  Problem Relation Age of Onset  . Hypertension Father   . Diabetes  Father     type 2   . Emphysema Maternal Grandmother     smoker  . Asthma Maternal Grandmother   . Lung cancer Maternal Grandmother   . Allergies      whole family-both sides of family  . Asthma Mother   . Clotting disorder Mother     Factor 5  . Rheum arthritis Mother   . Skin cancer Mother   . Asthma Paternal Grandmother   . Diabetes Paternal Grandmother     type 2  . Heart disease      MGM deceased with MI; both sides of grandparents  . Clotting disorder Maternal Uncle     Factor 5      Prior to Admission medications   Medication Sig Start Date End Date Taking? Authorizing Provider  ALPRAZolam Duanne Moron) 1 MG tablet Take 1 mg by mouth 2 (two) times daily as needed for anxiety.   Yes Historical Provider, MD  buPROPion (WELLBUTRIN) 75 MG tablet Take 1 tablet (75 mg total) by mouth daily. 09/09/15  Yes Janith Lima, MD  celecoxib (CELEBREX) 200 MG capsule TAKE ONE CAPSULE BY MOUTH EVERY DAY Patient taking differently: Take 200 mg by mouth daily as needed for moderate pain.  09/09/15  Yes Janith Lima, MD  diphenhydrAMINE (BENADRYL) 25 MG tablet Take 25 mg by mouth 2 (  two) times daily.   Yes Historical Provider, MD  famotidine (PEPCID) 20 MG tablet Take 20 mg by mouth 2 (two) times daily. 04/07/16  Yes Historical Provider, MD  losartan (COZAAR) 100 MG tablet Take 1 tablet (100 mg total) by mouth daily. 09/09/15  Yes Janith Lima, MD  Nutritional Supplements (JUICE PLUS FIBRE PO) Take 1 capsule by mouth daily.   Yes Historical Provider, MD  omeprazole (PRILOSEC) 40 MG capsule TAKE 1 CAPSULE BY MOUTH TWICE A DAY 09/09/15  Yes Janith Lima, MD  topiramate (TOPAMAX) 50 MG tablet Take 1 tablet (50 mg total) by mouth 2 (two) times daily. 05/16/15  Yes Melvenia Beam, MD  verapamil (CALAN-SR) 120 MG CR tablet Take 1 tablet by mouth daily. 03/12/16  Yes Historical Provider, MD  EPINEPHrine (EPI-PEN) 0.3 mg/0.3 mL DEVI Inject 0.3 mLs (0.3 mg total) into the muscle once. 02/23/13   Deneise Lever, MD  Ertapenem Sodium (INVANZ IV) Inject into the vein daily. Reported on 04/11/2016 03/28/16 04/18/16  Historical Provider, MD    Physical Exam: Filed Vitals:   04/11/16 1803 04/11/16 2003 04/11/16 2105  BP: 136/100 129/92 140/88  Pulse: 82 85 85  Temp: 98.1 F (36.7 C)    Resp: 18 16 18   Height: 6\' 6"  (1.981 m)    Weight: 182.8 kg (403 lb)    SpO2: 98% 99% 97%      Constitutional: NAD, calm, comfortable Eyes: PERRL, lids and conjunctivae normal ENMT: Mucous membranes are moist. Posterior pharynx clear of any exudate or lesions.Normal dentition.  Neck: normal, supple, no masses, no thyromegaly Respiratory: clear to auscultation bilaterally, no wheezing, no crackles. Normal respiratory effort. No accessory muscle use.  Cardiovascular: Regular rate and rhythm, no murmurs / rubs / gallops. No extremity edema. 2+ pedal pulses. No carotid bruits.  Abdomen: no tenderness, no masses palpated. No hepatosplenomegaly. Bowel sounds positive.  Musculoskeletal: no clubbing / cyanosis. No joint deformity upper and lower extremities. Good ROM, no contractures. Normal muscle tone.  Skin: no rashes, lesions, ulcers. No induration Neurologic: CN 2-12 grossly intact. Sensation intact, DTR normal. Strength 5/5 in all 4.  Psychiatric: Normal judgment and insight. Alert and oriented x 3. Normal mood.    Labs on Admission: I have personally reviewed following labs and imaging studies  CBC:  Recent Labs Lab 04/11/16 1954  WBC 8.9  NEUTROABS 5.3  HGB 13.8  HCT 42.2  MCV 81.0  PLT [id number]   Basic Metabolic Panel:  Recent Labs Lab 04/11/16 1954  NA 138  K 3.6  CL 108  CO2 24  GLUCOSE 100*  BUN 10  CREATININE 1.08  CALCIUM 8.8*   GFR: Estimated Creatinine Clearance: 181.1 mL/min (by C-G formula based on Cr of 1.08). Liver Function Tests:  Recent Labs Lab 04/11/16 1954  AST 26  ALT 34  ALKPHOS 65  BILITOT 1.1  PROT 6.7  ALBUMIN 3.3*   No results for input(s): LIPASE,  AMYLASE in the last 168 hours. No results for input(s): AMMONIA in the last 168 hours. Coagulation Profile: No results for input(s): INR, PROTIME in the last 168 hours. Cardiac Enzymes: No results for input(s): CKTOTAL, CKMB, CKMBINDEX, TROPONINI in the last 168 hours. BNP (last 3 results) No results for input(s): PROBNP in the last 8760 hours. HbA1C: No results for input(s): HGBA1C in the last 72 hours. CBG: No results for input(s): GLUCAP in the last 168 hours. Lipid Profile: No results for input(s): CHOL, HDL, LDLCALC, TRIG, CHOLHDL, LDLDIRECT in  the last 72 hours. Thyroid Function Tests: No results for input(s): TSH, T4TOTAL, FREET4, T3FREE, THYROIDAB in the last 72 hours. Anemia Panel: No results for input(s): VITAMINB12, FOLATE, FERRITIN, TIBC, IRON, RETICCTPCT in the last 72 hours. Urine analysis:    Component Value Date/Time   COLORURINE YELLOW 12/02/2009 2207   APPEARANCEUR CLEAR 12/02/2009 2207   LABSPEC 1.023 12/02/2009 2207   PHURINE 5.5 12/02/2009 2207   GLUCOSEU NEGATIVE 12/02/2009 2207   HGBUR NEGATIVE 12/02/2009 2207   BILIRUBINUR negative 12/01/2012 1432   BILIRUBINUR NEGATIVE 12/02/2009 2207   KETONESUR NEGATIVE 12/02/2009 2207   PROTEINUR negative 12/01/2012 1432   PROTEINUR NEGATIVE 12/02/2009 2207   UROBILINOGEN 0.2 12/01/2012 1432   UROBILINOGEN 0.2 12/02/2009 2207   NITRITE negative 12/01/2012 1432   NITRITE NEGATIVE 12/02/2009 2207   LEUKOCYTESUR Negative 12/01/2012 1432   Sepsis Labs: @LABRCNTIP (procalcitonin:4,lacticidven:4) )No results found for this or any previous visit (from the past 240 hour(s)).   Radiological Exams on Admission: No results found.  EKG: Independently reviewed.  Assessment/Plan Principal Problem:   Bacteremia associated with intravascular line ([place]) Active Problems:   Dental abscess   MRSA bacteremia    MRSA bacteremia with PICC line -  PICC line out  On vancomycin  Repeat BCx x2 drawn today  Repeat BCx x1  ordered for Monday  2d echo ordered for AM  Dr. Linus Salmons to consult tomorrow  Dental abscess -  Continue Invanz for now until ID says otherwise   DVT prophylaxis: Lovenox Code Status: Full Family Communication: Parents at bedside Consults called: EDP spoke with Dr. Linus Salmons who had sent the patient in to the ED for admit, see his note in epic. Admission status: Admit to inpatient.   Etta Quill DO [facility] Pager [phone] from 7PM-7AM  If 7AM-7PM, please contact the day physician for the patient [email or web address] Password [email or web address]  04/11/2016, 9:08 PM

## 2016-04-12 ENCOUNTER — Inpatient Hospital Stay (HOSPITAL_COMMUNITY): Payer: BLUE CROSS/BLUE SHIELD

## 2016-04-12 DIAGNOSIS — R7881 Bacteremia: Secondary | ICD-10-CM

## 2016-04-12 DIAGNOSIS — T827XXA Infection and inflammatory reaction due to other cardiac and vascular devices, implants and grafts, initial encounter: Secondary | ICD-10-CM

## 2016-04-12 DIAGNOSIS — B9562 Methicillin resistant Staphylococcus aureus infection as the cause of diseases classified elsewhere: Secondary | ICD-10-CM

## 2016-04-12 LAB — ECHOCARDIOGRAM COMPLETE
E/e' ratio: 6.2
FS: 38 % (ref 28–44)
Height: 78 in
IVS/LV PW RATIO, ED: 1.59
LA diam end sys: 42 mm
LA vol index: 14.2 mL/m2
LADIAMINDEX: 1.29 cm/m2
LASIZE: 42 mm
LAVOL: 46.2 mL
LAVOLA4C: 41 mL
LDCA: 6.16 cm2
LV TDI E'MEDIAL: 11.9
LV e' LATERAL: 12.2 cm/s
LVEEAVG: 6.2
LVEEMED: 6.2
LVOTD: 28 mm
Lateral S' vel: 14.8 cm/s
MV Peak grad: 2 mmHg
MVPKAVEL: 104 m/s
MVPKEVEL: 75.7 m/s
PW: 7.5 mm — AB (ref 0.6–1.1)
RV TAPSE: 23.6 mm
RV sys press: 27 mmHg
Reg peak vel: 246 cm/s
TDI e' lateral: 12.2
TRMAXVEL: 246 cm/s
Weight: 6480 oz

## 2016-04-12 MED ORDER — CLOTRIMAZOLE 10 MG MT TROC
10.0000 mg | Freq: Every day | OROMUCOSAL | Status: DC
  Administered 2016-04-12 – 2016-04-15 (×14): 10 mg via ORAL
  Filled 2016-04-12 (×21): qty 1

## 2016-04-12 MED ORDER — DIPHENHYDRAMINE HCL 25 MG PO CAPS
25.0000 mg | ORAL_CAPSULE | Freq: Once | ORAL | Status: AC
  Administered 2016-04-12: 25 mg via ORAL
  Filled 2016-04-12: qty 1

## 2016-04-12 NOTE — Progress Notes (Signed)
PROGRESS NOTE  Alexis [name]  [id number] DOB: 02-20-1986 DOA: 04/11/2016 PCP: Scarlette Calico, MD  Brief Narrative:   Alexis [name] is a 30 y.o. male with medical history significant of dental abscess being treated with 3 weeks of invanz scheduled to end on 7/8. Patient had rash around PICC [place], got seen in ED there. Cultures drawn from PICC. Patient got home today to mailbox and had letter that cultures were positive for GPC in C. Dr. Linus Salmons [place] and confirmed that cultures were growing MRSA. Patient sent in to ED. Assessment & Plan:   Principal Problem:   Bacteremia associated with intravascular line ([place]) Active Problems:   Dental abscess   MRSA bacteremia   MRSA bacteremia with PICC line -  PICC line was removed on 7/1 -  Continue vancomycin -  Blood cultures pending -  Echocardiogram without evidence of vegetation -  Appreciate infectious disease assistance  Dental abscess, improving, continue Invanz  DVT prophylaxis:  Lovenox Code Status:  Full code Family Communication:  Patient and his mother Disposition Plan:  Pending results of follow-up blood cultures. Okay to insert new PICC line once blood cultures are clear for at least 48 hours to continue home antibiotics, this time to include vancomycin for at least 4-6 weeks   Consultants:   Infectious disease  Procedures:  PICC line removal on 7/1  Antimicrobials:   Invanz started on 6/16  Vancomycin started on 7/1   Subjective:  Tooth is feeling better.   He had a papular rash under the dressing around his PICC line on his right arm that changed to include some diffuse area of pinkness that originated at the area where the line was inserted through the skin. Both rashes have improved since coming to the hospital and PICC line being removed.  He is still fatigued for the last week, but denies obvious fevers  Objective: Filed Vitals:   04/12/16  0032 04/12/16 0105 04/12/16 0642 04/12/16 1319  BP:  149/98 133/72 123/80  Pulse:  88 79 86  Temp: 97.8 F (36.6 C) 99 F (37.2 C) 98.5 F (36.9 C) 98.8 F (37.1 C)  TempSrc: Oral Oral  Oral  Resp:  19 20 17   Height:  6\' 6"  (1.981 m)    Weight:  183.707 kg (405 lb)    SpO2:  100% 100% 98%    Intake/Output Summary (Last 24 hours) at 04/12/16 1833 Last data filed at 04/12/16 1500  Gross per 24 hour  Intake   2198 ml  Output      0 ml  Net   2198 ml   Filed Weights   04/11/16 1803 04/12/16 0105  Weight: 182.8 kg (403 lb) 183.707 kg (405 lb)    Examination:  General exam:  Adult Male.  No acute distress.  HEENT:  NCAT, MMM Respiratory system: Clear to auscultation bilaterally Cardiovascular system: Regular rate and rhythm, normal S1/S2. No murmurs, rubs, gallops or clicks.  Warm extremities Gastrointestinal system: Normal active bowel sounds, soft, nondistended, nontender. MSK:  Normal tone and bulk, no lower extremity edema Neuro:  Grossly intact Skin: Right arm with an area of folliculitis, 1-2 mm erythematous papules in the distribution of the dressing. No obvious residual cellulitis   Data Reviewed: I have personally reviewed following labs and imaging studies  CBC:  Recent Labs Lab 04/11/16 1954  WBC 8.9  NEUTROABS 5.3  HGB 13.8  HCT 42.2  MCV 81.0  PLT [id number]   Basic Metabolic Panel:  Recent Labs Lab 04/11/16 1954  NA 138  K 3.6  CL 108  CO2 24  GLUCOSE 100*  BUN 10  CREATININE 1.08  CALCIUM 8.8*   GFR: Estimated Creatinine Clearance: 181.5 mL/min (by C-G formula based on Cr of 1.08). Liver Function Tests:  Recent Labs Lab 04/11/16 1954  AST 26  ALT 34  ALKPHOS 65  BILITOT 1.1  PROT 6.7  ALBUMIN 3.3*   No results for input(s): LIPASE, AMYLASE in the last 168 hours. No results for input(s): AMMONIA in the last 168 hours. Coagulation Profile: No results for input(s): INR, PROTIME in the last 168 hours. Cardiac Enzymes: No results for  input(s): CKTOTAL, CKMB, CKMBINDEX, TROPONINI in the last 168 hours. BNP (last 3 results) No results for input(s): PROBNP in the last 8760 hours. HbA1C: No results for input(s): HGBA1C in the last 72 hours. CBG: No results for input(s): GLUCAP in the last 168 hours. Lipid Profile: No results for input(s): CHOL, HDL, LDLCALC, TRIG, CHOLHDL, LDLDIRECT in the last 72 hours. Thyroid Function Tests: No results for input(s): TSH, T4TOTAL, FREET4, T3FREE, THYROIDAB in the last 72 hours. Anemia Panel: No results for input(s): VITAMINB12, FOLATE, FERRITIN, TIBC, IRON, RETICCTPCT in the last 72 hours. Urine analysis:    Component Value Date/Time   COLORURINE YELLOW 12/02/2009 2207   APPEARANCEUR CLEAR 12/02/2009 2207   LABSPEC 1.023 12/02/2009 2207   PHURINE 5.5 12/02/2009 2207   GLUCOSEU NEGATIVE 12/02/2009 2207   HGBUR NEGATIVE 12/02/2009 2207   BILIRUBINUR negative 12/01/2012 1432   BILIRUBINUR NEGATIVE 12/02/2009 2207   KETONESUR NEGATIVE 12/02/2009 2207   PROTEINUR negative 12/01/2012 1432   PROTEINUR NEGATIVE 12/02/2009 2207   UROBILINOGEN 0.2 12/01/2012 1432   UROBILINOGEN 0.2 12/02/2009 2207   NITRITE negative 12/01/2012 1432   NITRITE NEGATIVE 12/02/2009 2207   LEUKOCYTESUR Negative 12/01/2012 1432   Sepsis Labs: @LABRCNTIP (procalcitonin:4,lacticidven:4)  ) Recent Results (from the past 240 hour(s))  Culture, blood (routine x 2)     Status: None (Preliminary result)   Collection Time: 04/11/16  7:45 PM  Result Value Ref Range Status   Specimen Description BLOOD RIGHT PICC LINE  Final   Special Requests BOTTLES DRAWN AEROBIC AND ANAEROBIC 5CC  Final   Culture NO GROWTH < 24 HOURS  Final   Report Status PENDING  Incomplete  Culture, blood (routine x 2)     Status: None (Preliminary result)   Collection Time: 04/11/16  7:53 PM  Result Value Ref Range Status   Specimen Description BLOOD LEFT FOREARM  Final   Special Requests BOTTLES DRAWN AEROBIC AND ANAEROBIC 5CC  Final     Culture NO GROWTH < 24 HOURS  Final   Report Status PENDING  Incomplete      Radiology Studies: No results found.   Scheduled Meds: . buPROPion  75 mg Oral Daily  . clotrimazole  10 mg Oral 5 X Daily  . diphenhydrAMINE  25 mg Oral BID  . enoxaparin (LOVENOX) injection  90 mg Subcutaneous Q24H  . ertapenem  1 g Intravenous QHS  . famotidine  20 mg Oral BID  . losartan  100 mg Oral Daily  . pantoprazole  80 mg Oral Daily  . topiramate  50 mg Oral BID  . vancomycin  1,500 mg Intravenous Q8H  . verapamil  120 mg Oral Daily   Continuous Infusions:    LOS: 1 day    Time spent: 30 min  Alexis Canterbury, MD [facility] Pager [phone]  If 7PM-7AM, please contact night-coverage [email or web address] Password [facility] 04/12/2016, 6:33 PM

## 2016-04-12 NOTE — Progress Notes (Signed)
  Echocardiogram 2D Echocardiogram has been performed.  Alexis [name] 04/12/2016, 9:11 AM

## 2016-04-12 NOTE — Consult Note (Signed)
[facility] for Infectious Disease       Reason for Consult: MRSA bacteremia    Referring Physician: Dr. Sheran Fava  Principal Problem:   Bacteremia associated with intravascular line [facility]) Active Problems:   Dental abscess   MRSA bacteremia   . buPROPion  75 mg Oral Daily  . clotrimazole  10 mg Oral 5 X Daily  . diphenhydrAMINE  25 mg Oral BID  . enoxaparin (LOVENOX) injection  90 mg Subcutaneous Q24H  . ertapenem  1 g Intravenous QHS  . famotidine  20 mg Oral BID  . losartan  100 mg Oral Daily  . pantoprazole  80 mg Oral Daily  . topiramate  50 mg Oral BID  . vancomycin  1,500 mg Intravenous Q8H  . verapamil  120 mg Oral Daily    Recommendations: Vancomycin Repeat blood cultures tomorrow on therapy TEE picc when blood cultures clear at least 48 hours Can stop Invanz at discharge  Routine HIV  Assessment: He has MRSA bacteremia from [facility] ED in [place], [place].  I have asked for a copy of the report to be sent to [facility].  The blood culture was drawn on 6/26 and reported 6/28 (by mail to patient who was out of town).  TTE without vegetation.  Also with sinus infection on Invanz per Dr. Johnnye Sima.    Antibiotics: Invanz since 6/16 Vancomycin day 2  HPI: Alexis [name] is a 29 y.o. male with history of dental space infection on CT following dental procedure and on ertapenem for this via picc line who was feeling poorly last week with redness surrounding his picc line and went [place] a local ED [place] the [place] who [place] him but subsequent blood cultures reportedly grew MRSA.  He was sent a letter to his home he received yesterday and I was called and notified it was MRSA and had him admitted here.  Picc line pulled.   Called ED to verify MRSA.  Sending report once medical release sent.   Review of Systems:  Constitutional: positive for fatigue and malaise or negative for fevers and anorexia Cardiovascular: negative for chest pain Musculoskeletal: negative for myalgias  and arthralgias All other systems reviewed and are negative   Past Medical History  Diagnosis Date  . ALLERGIC RHINITIS 06/10/2009  . ADD 09/13/2007  . ASTHMA, UNSPECIFIED, UNSPECIFIED STATUS 07/19/2008  . DEPRESSIVE DISORDER 09/13/2007  . DYSMETABOLIC SYNDROME 0000000  . GERD 09/13/2007  . HYPERLIPIDEMIA 10/10/2009  . HYPERTENSION 09/13/2007  . IBS 03/20/2009  . Morbid obesity ([place]) 07/19/2008  . Skin cancer   . Acid reflux     Social History  Substance Use Topics  . Smoking status: Never Smoker   . Smokeless tobacco: Never Used  . Alcohol Use: No     Comment: socially    Family History  Problem Relation Age of Onset  . Hypertension Father   . Diabetes Father     type 2   . Emphysema Maternal Grandmother     smoker  . Asthma Maternal Grandmother   . Lung cancer Maternal Grandmother   . Allergies      whole family-both sides of family  . Asthma Mother   . Clotting disorder Mother     Factor 5  . Rheum arthritis Mother   . Skin cancer Mother   . Asthma Paternal Grandmother   . Diabetes Paternal Grandmother     type 2  . Heart disease      MGM  deceased with MI; both sides of grandparents  . Clotting disorder Maternal Uncle     Factor 5    Allergies  Allergen Reactions  . Citrus Anaphylaxis and Other (See Comments)    Lemon  . Amoxicillin-Pot Clavulanate Nausea Only  . Sulfamethoxazole-Trimethoprim Other (See Comments)    Does not remember, long ago  . Vancomycin Other (See Comments)    Mild red mans syndrome, infuse more slowly.    Physical Exam: Constitutional: in no apparent distress and alert ; not ill appearing Filed Vitals:   04/12/16 0105 04/12/16 0642  BP: 149/98 133/72  Pulse: 88 79  Temp: 99 F (37.2 C) 98.5 F (36.9 C)  Resp: 19 20   EYES: anicteric ENMT: no thrush Cardiovascular: Cor RRR and No murmurs Respiratory: CTA B; normal respiratory effort GI: Bowel sounds are normal, liver is not enlarged, spleen is not  enlarged Musculoskeletal: no pedal edema noted Skin: negatives: no rash Neuro: non focal  Lab Results  Component Value Date   WBC 8.9 04/11/2016   HGB 13.8 04/11/2016   HCT 42.2 04/11/2016   MCV 81.0 04/11/2016   PLT 235 04/11/2016    Lab Results  Component Value Date   CREATININE 1.08 04/11/2016   BUN 10 04/11/2016   NA 138 04/11/2016   K 3.6 04/11/2016   CL 108 04/11/2016   CO2 24 04/11/2016    Lab Results  Component Value Date   ALT 34 04/11/2016   AST 26 04/11/2016   ALKPHOS 65 04/11/2016     Microbiology: No results found for this or any previous visit (from the past 240 hour(s)).  Scharlene Gloss, [place] for Infectious Disease [place] [email or web address] [phone] pager  [phone] cell 04/12/2016, 11:38 AM

## 2016-04-13 DIAGNOSIS — R7881 Bacteremia: Secondary | ICD-10-CM

## 2016-04-13 LAB — HIV ANTIBODY (ROUTINE TESTING W REFLEX): HIV Screen 4th Generation wRfx: NONREACTIVE

## 2016-04-13 LAB — VANCOMYCIN, TROUGH: Vancomycin Tr: 24 ug/mL (ref 15–20)

## 2016-04-13 MED ORDER — VERAPAMIL HCL ER 120 MG PO TBCR
120.0000 mg | EXTENDED_RELEASE_TABLET | Freq: Every day | ORAL | Status: DC
  Administered 2016-04-14 – 2016-04-15 (×2): 120 mg via ORAL
  Filled 2016-04-13 (×4): qty 1

## 2016-04-13 MED ORDER — SACCHAROMYCES BOULARDII 250 MG PO CAPS
250.0000 mg | ORAL_CAPSULE | Freq: Two times a day (BID) | ORAL | Status: DC
  Administered 2016-04-13 – 2016-04-14 (×4): 250 mg via ORAL
  Filled 2016-04-13 (×5): qty 1

## 2016-04-13 NOTE — Progress Notes (Signed)
Notified pharmacist of patient's critical vancomycin trough value. Also notified pharmacist rate of vancomycin running slower at 153ml/hr instead of 233ml/hr due to patient having slight reaction to vancomycin yesterday. Pharmacist acknowledged and stated okay to continue infusing current vancomycin.

## 2016-04-13 NOTE — Progress Notes (Signed)
Patient ID: Alexis [name], male   DOB: 1986/01/27, 30 y.o.   MRN: [id number]         [facility]    Date of Admission:  04/11/2016           Day 17 ertapenem        Day 3 vancomycin  Principal Problem:   MRSA bacteremia Active Problems:   Acute maxillary sinusitis   Dental abscess   . buPROPion  75 mg Oral Daily  . clotrimazole  10 mg Oral 5 X Daily  . diphenhydrAMINE  25 mg Oral BID  . enoxaparin (LOVENOX) injection  90 mg Subcutaneous Q24H  . ertapenem  1 g Intravenous QHS  . famotidine  20 mg Oral BID  . losartan  100 mg Oral Daily  . pantoprazole  80 mg Oral Daily  . saccharomyces boulardii  250 mg Oral BID  . topiramate  50 mg Oral BID  . vancomycin  1,500 mg Intravenous Q8H  . verapamil  120 mg Oral Daily    SUBJECTIVE: He is feeling much better. The pressure over his right maxillary sinus has resolved.  Review of Systems: Review of Systems  Constitutional: Negative for fever, chills, weight loss, malaise/fatigue and diaphoresis.  HENT: Negative for sore throat.   Respiratory: Negative for cough, sputum production and shortness of breath.   Cardiovascular: Negative for chest pain.  Gastrointestinal: Negative for nausea, vomiting, abdominal pain and diarrhea.  Genitourinary: Negative for dysuria and frequency.  Musculoskeletal: Negative for myalgias and joint pain.  Skin: Negative for itching and rash.  Neurological: Negative for headaches.    Past Medical History  Diagnosis Date  . ALLERGIC RHINITIS 06/10/2009  . ADD 09/13/2007  . ASTHMA, UNSPECIFIED, UNSPECIFIED STATUS 07/19/2008  . DEPRESSIVE DISORDER 09/13/2007  . DYSMETABOLIC SYNDROME 0000000  . GERD 09/13/2007  . HYPERLIPIDEMIA 10/10/2009  . HYPERTENSION 09/13/2007  . IBS 03/20/2009  . Morbid obesity ([place]) 07/19/2008  . Skin cancer   . Acid reflux     Social History  Substance Use Topics  . Smoking status: Never Smoker   . Smokeless tobacco: Never Used  . Alcohol Use: No       Comment: socially    Family History  Problem Relation Age of Onset  . Hypertension Father   . Diabetes Father     type 2   . Emphysema Maternal Grandmother     smoker  . Asthma Maternal Grandmother   . Lung cancer Maternal Grandmother   . Allergies      whole family-both sides of family  . Asthma Mother   . Clotting disorder Mother     Factor 5  . Rheum arthritis Mother   . Skin cancer Mother   . Asthma Paternal Grandmother   . Diabetes Paternal Grandmother     type 2  . Heart disease      MGM deceased with MI; both sides of grandparents  . Clotting disorder Maternal Uncle     Factor 5   Allergies  Allergen Reactions  . Citrus Anaphylaxis and Other (See Comments)    Lemon  . Amoxicillin-Pot Clavulanate Nausea Only  . Sulfamethoxazole-Trimethoprim Other (See Comments)    Does not remember, long ago  . Vancomycin Other (See Comments)    Mild red mans syndrome, infuse more slowly.    OBJECTIVE: Filed Vitals:   04/12/16 0642 04/12/16 1319 04/12/16 2206 04/13/16 0539  BP: 133/72 123/80 126/78 135/73  Pulse: 79 86 83 69  Temp: 98.5 F (36.9 C) 98.8 F (37.1 C) 98.8 F (37.1 C) 97.8 F (36.6 C)  TempSrc:  Oral Oral Oral  Resp: 20 17 18 18   Height:      Weight:      SpO2: 100% 98% 100% 98%   Body mass index is 46.81 kg/(m^2).  Physical Exam  Constitutional: He is oriented to person, place, and time.  He is alert and comfortable.  HENT:  Mouth/Throat: No oropharyngeal exudate.  Cardiovascular: Normal rate and regular rhythm.   No murmur heard. Pulmonary/Chest: Effort normal and breath sounds normal. He has no wheezes. He has no rales.  Abdominal: Soft. There is no tenderness.  Neurological: He is alert and oriented to person, place, and time.  Skin: No rash noted.  His right arm rash has resolved. His right arm PICC has been removed.  Psychiatric: Mood and affect normal.    Lab Results Lab Results  Component Value Date   WBC 8.9 04/11/2016   HGB  13.8 04/11/2016   HCT 42.2 04/11/2016   MCV 81.0 04/11/2016   PLT 235 04/11/2016    Lab Results  Component Value Date   CREATININE 1.08 04/11/2016   BUN 10 04/11/2016   NA 138 04/11/2016   K 3.6 04/11/2016   CL 108 04/11/2016   CO2 24 04/11/2016    Lab Results  Component Value Date   ALT 34 04/11/2016   AST 26 04/11/2016   ALKPHOS 65 04/11/2016   BILITOT 1.1 04/11/2016     Microbiology: Recent Results (from the past 240 hour(s))  Culture, blood (routine x 2)     Status: None (Preliminary result)   Collection Time: 04/11/16  7:45 PM  Result Value Ref Range Status   Specimen Description BLOOD RIGHT PICC LINE  Final   Special Requests BOTTLES DRAWN AEROBIC AND ANAEROBIC 5CC  Final   Culture NO GROWTH 2 DAYS  Final   Report Status PENDING  Incomplete  Culture, blood (routine x 2)     Status: None (Preliminary result)   Collection Time: 04/11/16  7:53 PM  Result Value Ref Range Status   Specimen Description BLOOD LEFT FOREARM  Final   Special Requests BOTTLES DRAWN AEROBIC AND ANAEROBIC 5CC  Final   Culture NO GROWTH 2 DAYS  Final   Report Status PENDING  Incomplete     ASSESSMENT: His sinusitis and dental infection appear to have resolved. I agree with Dr. Henreitta Leber plan to discontinue ertapenem on discharge. He tells me that both sets of blood cultures done at the outside emergency department were drawn through his PICC. It does sound like he had a PICC infection. Outside blood cultures grew MRSA. Blood cultures obtained here from peripheral sticks on 04/11/2016 are negative. If they remain negative a PICC can be placed tomorrow morning. Optimal duration of vancomycin will depend on final blood culture results and TEE. I will follow-up on Wednesday, 04/15/2016.   PLAN: 1. Continue vancomycin and ertapenem for now 2. PICC placement tomorrow if blood cultures remain negative 3. TEE  Michel Bickers, MD [facility]  Group [phone] pager   [phone] cell 04/13/2016, 2:06 PM

## 2016-04-13 NOTE — Progress Notes (Signed)
[facility]  Patient Status: Active (receiving services up to time of hospitalization)  [facility] is providing the following services: RN  If patient discharges after hours, please call [phone].   Alexis [name] 04/13/2016, 9:03 AM

## 2016-04-13 NOTE — Progress Notes (Signed)
PROGRESS NOTE  Alexis [name]  [id number] DOB: Mar 23, 1986 DOA: 04/11/2016 PCP: Scarlette Calico, MD  Brief Narrative:   Alexis [name] is a 30 y.o. male with medical history significant of dental abscess being treated with 3 weeks of invanz scheduled to end on 7/8. Patient [place] around [place] got seen in ED there. Cultures drawn from PICC. Patient got home today to mailbox and had letter that cultures were positive for GPC in C. Dr. Linus Salmons [place] and confirmed that cultures were growing MRSA. Patient sent in to ED.  Assessment & Plan:   Principal Problem:   Bacteremia associated with intravascular line ([place]) Active Problems:   Dental abscess   MRSA bacteremia   MRSA bacteremia with PICC line -  PICC line was removed on 7/1 -  Continue vancomycin -  Blood cultures from admission NGTD -  Echocardiogram without evidence of vegetation -  Plan for TEE.  Ordered and notified cardiology -  Appreciate infectious disease assistance  Dental abscess, improving, continue Invanz  DVT prophylaxis:  Lovenox Code Status:  Full code Family Communication:  Patient and his mother Disposition Plan:  Pending results of follow-up blood cultures. Okay to insert new PICC line once blood cultures are clear for at least 48 hours to continue home antibiotics.  Awaiting TEE   Consultants:   Infectious disease  Procedures:  PICC line removal on 7/1  Antimicrobials:   Invanz started on 6/16  Vancomycin started on 7/1   Subjective:  Tooth is feeling better.  Rashes on both arms are much better.  Was able to ambulate the halls without difficulty.  Denies fevers, chills.  Still tired.    Objective: Filed Vitals:   04/12/16 0642 04/12/16 1319 04/12/16 2206 04/13/16 0539  BP: 133/72 123/80 126/78 135/73  Pulse: 79 86 83 69  Temp: 98.5 F (36.9 C) 98.8 F (37.1 C) 98.8 F (37.1 C) 97.8 F (36.6 C)  TempSrc:  Oral Oral Oral    Resp: 20 17 18 18   Height:      Weight:      SpO2: 100% 98% 100% 98%    Intake/Output Summary (Last 24 hours) at 04/13/16 1136 Last data filed at 04/13/16 0900  Gross per 24 hour  Intake   2628 ml  Output      0 ml  Net   2628 ml   Filed Weights   04/11/16 1803 04/12/16 0105  Weight: 182.8 kg (403 lb) 183.707 kg (405 lb)    Examination:  General exam:  Adult Male.  No acute distress.  HEENT:  NCAT, MMM Respiratory system: Clear to auscultation bilaterally Cardiovascular system: Regular rate and rhythm, normal S1/S2. No murmurs, rubs, gallops or clicks.  Warm extremities Gastrointestinal system: Normal active bowel sounds, soft, nondistended, nontender. MSK:  Normal tone and bulk, no lower extremity edema Neuro:  Grossly intact Skin:  Follicular rash almost completely resolved.  No obvious residual cellulitis on right arm but had some pus draining from catheter insertion site.  Markedly improved on left arm where previous PIV was in place.     Data Reviewed: I have personally reviewed following labs and imaging studies  CBC:  Recent Labs Lab 04/11/16 1954  WBC 8.9  NEUTROABS 5.3  HGB 13.8  HCT 42.2  MCV 81.0  PLT [id number]   Basic Metabolic Panel:  Recent Labs Lab 04/11/16 1954  NA 138  K 3.6  CL 108  CO2 24  GLUCOSE 100*  BUN 10  CREATININE 1.08  CALCIUM 8.8*   GFR: Estimated Creatinine Clearance: 181.5 mL/min (by C-G formula based on Cr of 1.08). Liver Function Tests:  Recent Labs Lab 04/11/16 1954  AST 26  ALT 34  ALKPHOS 65  BILITOT 1.1  PROT 6.7  ALBUMIN 3.3*   No results for input(s): LIPASE, AMYLASE in the last 168 hours. No results for input(s): AMMONIA in the last 168 hours. Coagulation Profile: No results for input(s): INR, PROTIME in the last 168 hours. Cardiac Enzymes: No results for input(s): CKTOTAL, CKMB, CKMBINDEX, TROPONINI in the last 168 hours. BNP (last 3 results) No results for input(s): PROBNP in the last 8760  hours. HbA1C: No results for input(s): HGBA1C in the last 72 hours. CBG: No results for input(s): GLUCAP in the last 168 hours. Lipid Profile: No results for input(s): CHOL, HDL, LDLCALC, TRIG, CHOLHDL, LDLDIRECT in the last 72 hours. Thyroid Function Tests: No results for input(s): TSH, T4TOTAL, FREET4, T3FREE, THYROIDAB in the last 72 hours. Anemia Panel: No results for input(s): VITAMINB12, FOLATE, FERRITIN, TIBC, IRON, RETICCTPCT in the last 72 hours. Urine analysis:    Component Value Date/Time   COLORURINE YELLOW 12/02/2009 2207   APPEARANCEUR CLEAR 12/02/2009 2207   LABSPEC 1.023 12/02/2009 2207   PHURINE 5.5 12/02/2009 2207   GLUCOSEU NEGATIVE 12/02/2009 2207   HGBUR NEGATIVE 12/02/2009 2207   BILIRUBINUR negative 12/01/2012 1432   BILIRUBINUR NEGATIVE 12/02/2009 2207   KETONESUR NEGATIVE 12/02/2009 2207   PROTEINUR negative 12/01/2012 1432   PROTEINUR NEGATIVE 12/02/2009 2207   UROBILINOGEN 0.2 12/01/2012 1432   UROBILINOGEN 0.2 12/02/2009 2207   NITRITE negative 12/01/2012 1432   NITRITE NEGATIVE 12/02/2009 2207   LEUKOCYTESUR Negative 12/01/2012 1432   Sepsis Labs: @LABRCNTIP (procalcitonin:4,lacticidven:4)  ) Recent Results (from the past 240 hour(s))  Culture, blood (routine x 2)     Status: None (Preliminary result)   Collection Time: 04/11/16  7:45 PM  Result Value Ref Range Status   Specimen Description BLOOD RIGHT PICC LINE  Final   Special Requests BOTTLES DRAWN AEROBIC AND ANAEROBIC 5CC  Final   Culture NO GROWTH 2 DAYS  Final   Report Status PENDING  Incomplete  Culture, blood (routine x 2)     Status: None (Preliminary result)   Collection Time: 04/11/16  7:53 PM  Result Value Ref Range Status   Specimen Description BLOOD LEFT FOREARM  Final   Special Requests BOTTLES DRAWN AEROBIC AND ANAEROBIC 5CC  Final   Culture NO GROWTH 2 DAYS  Final   Report Status PENDING  Incomplete      Radiology Studies: No results found.   Scheduled Meds: .  buPROPion  75 mg Oral Daily  . clotrimazole  10 mg Oral 5 X Daily  . diphenhydrAMINE  25 mg Oral BID  . enoxaparin (LOVENOX) injection  90 mg Subcutaneous Q24H  . ertapenem  1 g Intravenous QHS  . famotidine  20 mg Oral BID  . losartan  100 mg Oral Daily  . pantoprazole  80 mg Oral Daily  . saccharomyces boulardii  250 mg Oral BID  . topiramate  50 mg Oral BID  . vancomycin  1,500 mg Intravenous Q8H  . verapamil  120 mg Oral Daily   Continuous Infusions:    LOS: 2 days    Time spent: 30 min    Janece Canterbury, MD [facility] Pager [phone]  If 7PM-7AM, please contact night-coverage [email or web address] Password [facility] 04/13/2016,  11:36 AM

## 2016-04-13 NOTE — Progress Notes (Signed)
Signed consent for release of medical records faxed to [facility] ED in [place], [place]. Awaiting receipt of medical records.

## 2016-04-13 NOTE — Progress Notes (Signed)
Pharmacy Antibiotic Note  Alexis [name] is a 30 y.o. male admitted on 04/11/2016 with bacteremia.  Pharmacy has been consulted for vancomycin dosing.  Vanc trough 24 but last dose was given 1hr late and lab was drawn 1hr early, extrapolates to a trough of ~19, within goal.  Of note pt had a skin reaction with higher infusion rate, so current dose is being infused over ~3.5hr.  Plan: This patient's current antibiotics will be continued without adjustments.  Height: 6\' 6"  (198.1 cm) Weight: (!) 405 lb (183.707 kg) IBW/kg (Calculated) : 91.4  Temp (24hrs), Avg:98.5 F (36.9 C), Min:97.8 F (36.6 C), Max:98.8 F (37.1 C)   Recent Labs Lab 04/11/16 1954 04/13/16 0449  WBC 8.9  --   CREATININE 1.08  --   VANCOTROUGH  --  24*    Estimated Creatinine Clearance: 181.5 mL/min (by C-G formula based on Cr of 1.08).    Allergies  Allergen Reactions  . Citrus Anaphylaxis and Other (See Comments)    Lemon  . Amoxicillin-Pot Clavulanate Nausea Only  . Sulfamethoxazole-Trimethoprim Other (See Comments)    Does not remember, long ago  . Vancomycin Other (See Comments)    Mild red mans syndrome, infuse more slowly.    Antimicrobials this admission: Ertapenem PTA 6/17 >> (planned 7/7)  Vanc 7/1 >>   Dose adjustments this admission: n/a  Microbiology results: 7/1 BCx: IP  Thank you for allowing pharmacy to be a part of this patient's care.  Wynona Neat, PharmD, BCPS  04/13/2016 6:53 AM

## 2016-04-13 NOTE — Progress Notes (Signed)
Medical records from [facility] ED received via fax. Records placed in patient's chart.

## 2016-04-13 NOTE — Care Management Note (Signed)
Case Management Note  Patient Details  Name: Alexis [name] MRN: [id number] Date of Birth: [birth year]  Subjective/Objective:  Pt admitted on 04/11/16 with MRSA bacteremia.  PTA, pt receiving home health RN services for home IV antibiotics through [facility].                   Action/Plan:  Will follow for resumption of care services upon dc, should IV abx need continuation upon dc.  [facility] agency aware of admission.    Expected Discharge Date:       Expected Discharge Plan:  [name]  In-House Referral:     Discharge planning Services  CM Consult  Post Acute Care Choice:  Resumption of Svcs/PTA Provider Choice offered to:     DME Arranged:    DME Agency:     HH Arranged:    Cash Agency:  [place]  Status of Service:  In process, will continue to follow  If discussed at Long Length of Stay Meetings, dates discussed:    Additional Comments:  Reinaldo Raddle, RN, BSN  Trauma/Neuro ICU Case Manager [phone]

## 2016-04-13 NOTE — Progress Notes (Signed)
    [facility] has been requested to perform a transesophageal echocardiogram on JOEPH VIDAL for 04/15/16.  After careful review of history and examination, the risks and benefits of transesophageal echocardiogram have been explained including risks of esophageal damage, perforation (1:10,000 risk), bleeding, pharyngeal hematoma as well as other potential complications associated with conscious sedation including aspiration, arrhythmia, respiratory failure and death. Alternatives to treatment were discussed, questions were answered. Patient is willing to proceed. NPO after midnight tomorrow.   Angelena Form PA-C 04/13/2016 4:38 PM

## 2016-04-14 DIAGNOSIS — J0100 Acute maxillary sinusitis, unspecified: Secondary | ICD-10-CM

## 2016-04-14 LAB — BASIC METABOLIC PANEL
Anion gap: 5 (ref 5–15)
BUN: 10 mg/dL (ref 6–20)
CHLORIDE: 109 mmol/L (ref 101–111)
CO2: 24 mmol/L (ref 22–32)
CREATININE: 1.07 mg/dL (ref 0.61–1.24)
Calcium: 8.9 mg/dL (ref 8.9–10.3)
GFR calc Af Amer: >60 mL/min (ref >60)
GFR calc non Af Amer: >60 mL/min (ref >60)
GLUCOSE: 84 mg/dL (ref 65–99)
Potassium: 3.7 mmol/L (ref 3.5–5.1)
SODIUM: 138 mmol/L (ref 135–145)

## 2016-04-14 LAB — VANCOMYCIN, TROUGH: VANCOMYCIN TR: 21 ug/mL — AB (ref 15–20)

## 2016-04-14 MED ORDER — VANCOMYCIN HCL 10 G IV SOLR
1250.0000 mg | Freq: Three times a day (TID) | INTRAVENOUS | Status: DC
  Administered 2016-04-14 – 2016-04-15 (×4): 1250 mg via INTRAVENOUS
  Filled 2016-04-14 (×6): qty 1250

## 2016-04-14 MED ORDER — SODIUM CHLORIDE 0.9 % IV SOLN
INTRAVENOUS | Status: DC
  Administered 2016-04-14: 22:00:00 via INTRAVENOUS

## 2016-04-14 NOTE — Progress Notes (Signed)
Patient refused to let IV team place picc at bedside. He requests it be placed in radiology to minimize risk of infection.

## 2016-04-14 NOTE — Progress Notes (Signed)
PROGRESS NOTE  Alexis [name]  [id number] DOB: 1986/05/15 DOA: 04/11/2016 PCP: Scarlette Calico, MD  Brief Narrative:   Alexis [name] is a 30 y.o. male with medical history significant of dental abscess being treated with 3 weeks of invanz scheduled to end on 7/8. Patient [place] around [place]ICC [place] got seen in ED there. Cultures drawn from PICC. Patient got home today to mailbox and had letter that cultures were positive for GPC in C. Dr. Linus Salmons [place] and confirmed that cultures were growing MRSA. Patient sent in to ED.  Assessment & Plan:   Principal Problem:   MRSA bacteremia Active Problems:   Acute maxillary sinusitis   Dental abscess   MRSA bacteremia secondary to PICC line infection -  PICC line was removed on 7/1 -  Continue vancomycin -  Blood cultures from admission NGTD -  Echocardiogram without evidence of vegetation -  Plan for TEE 7/5 -  Appreciate infectious disease assistance  Dental abscess, improving, continue Invanz  DVT prophylaxis:  Lovenox Code Status:  Full code Family Communication:  Patient and his mother Disposition Plan:  Likely home on Wednesday after TEE completed  Consultants:   Infectious disease  Procedures:  PICC line removal on 7/1  Antimicrobials:   Invanz started on 6/16  Vancomycin started on 7/1   Subjective:  Has had several PIVs since admission, keep infiltrating. Having more energy today and ambulating the halls  Objective: Filed Vitals:   04/13/16 1352 04/13/16 2104 04/14/16 0433 04/14/16 1418  BP: 128/75 119/90 110/68 100/63  Pulse: 70 84 71 83  Temp: 97.5 F (36.4 C) 99.3 F (37.4 C) 98.7 F (37.1 C) 99.1 F (37.3 C)  TempSrc: Oral Oral Oral Oral  Resp: 18 18 19 18   Height:      Weight:      SpO2: 99% 98% 99% 99%    Intake/Output Summary (Last 24 hours) at 04/14/16 1620 Last data filed at 04/14/16 0900  Gross per 24 hour  Intake   1690 ml    Output      0 ml  Net   1690 ml   Filed Weights   04/11/16 1803 04/12/16 0105  Weight: 182.8 kg (403 lb) 183.707 kg (405 lb)    Examination:  General exam:  Adult Male.  No acute distress.  HEENT:  NCAT, MMM Respiratory system: Clear to auscultation bilaterally Cardiovascular system: Regular rate and rhythm, normal S1/S2. No murmurs, rubs, gallops or clicks.  Warm extremities Gastrointestinal system: Normal active bowel sounds, soft, nondistended, nontender. MSK:  Normal tone and bulk, no lower extremity edema Neuro:  Grossly intact Skin:  Follicular rash on right upper arm completely resolved.   Left forearm has an area of pink erythema around where his previous PIV was inserted  Data Reviewed: I have personally reviewed following labs and imaging studies  CBC:  Recent Labs Lab 04/11/16 1954  WBC 8.9  NEUTROABS 5.3  HGB 13.8  HCT 42.2  MCV 81.0  PLT [id number]   Basic Metabolic Panel:  Recent Labs Lab 04/11/16 1954 04/14/16 0447  NA 138 138  K 3.6 3.7  CL 108 109  CO2 24 24  GLUCOSE 100* 84  BUN 10 10  CREATININE 1.08 1.07  CALCIUM 8.8* 8.9   GFR: Estimated Creatinine Clearance: 183.2 mL/min (by C-G formula based on Cr of 1.07). Liver Function Tests:  Recent Labs Lab 04/11/16 1954  AST  26  ALT 34  ALKPHOS 65  BILITOT 1.1  PROT 6.7  ALBUMIN 3.3*   No results for input(s): LIPASE, AMYLASE in the last 168 hours. No results for input(s): AMMONIA in the last 168 hours. Coagulation Profile: No results for input(s): INR, PROTIME in the last 168 hours. Cardiac Enzymes: No results for input(s): CKTOTAL, CKMB, CKMBINDEX, TROPONINI in the last 168 hours. BNP (last 3 results) No results for input(s): PROBNP in the last 8760 hours. HbA1C: No results for input(s): HGBA1C in the last 72 hours. CBG: No results for input(s): GLUCAP in the last 168 hours. Lipid Profile: No results for input(s): CHOL, HDL, LDLCALC, TRIG, CHOLHDL, LDLDIRECT in the last 72  hours. Thyroid Function Tests: No results for input(s): TSH, T4TOTAL, FREET4, T3FREE, THYROIDAB in the last 72 hours. Anemia Panel: No results for input(s): VITAMINB12, FOLATE, FERRITIN, TIBC, IRON, RETICCTPCT in the last 72 hours. Urine analysis:    Component Value Date/Time   COLORURINE YELLOW 12/02/2009 2207   APPEARANCEUR CLEAR 12/02/2009 2207   LABSPEC 1.023 12/02/2009 2207   PHURINE 5.5 12/02/2009 2207   GLUCOSEU NEGATIVE 12/02/2009 2207   HGBUR NEGATIVE 12/02/2009 2207   BILIRUBINUR negative 12/01/2012 1432   BILIRUBINUR NEGATIVE 12/02/2009 2207   KETONESUR NEGATIVE 12/02/2009 2207   PROTEINUR negative 12/01/2012 1432   PROTEINUR NEGATIVE 12/02/2009 2207   UROBILINOGEN 0.2 12/01/2012 1432   UROBILINOGEN 0.2 12/02/2009 2207   NITRITE negative 12/01/2012 1432   NITRITE NEGATIVE 12/02/2009 2207   LEUKOCYTESUR Negative 12/01/2012 1432   Sepsis Labs: @LABRCNTIP (procalcitonin:4,lacticidven:4)  ) Recent Results (from the past 240 hour(s))  Culture, blood (routine x 2)     Status: None (Preliminary result)   Collection Time: 04/11/16  7:45 PM  Result Value Ref Range Status   Specimen Description BLOOD RIGHT PICC LINE  Final   Special Requests BOTTLES DRAWN AEROBIC AND ANAEROBIC 5CC  Final   Culture NO GROWTH 3 DAYS  Final   Report Status PENDING  Incomplete  Culture, blood (routine x 2)     Status: None (Preliminary result)   Collection Time: 04/11/16  7:53 PM  Result Value Ref Range Status   Specimen Description BLOOD LEFT FOREARM  Final   Special Requests BOTTLES DRAWN AEROBIC AND ANAEROBIC 5CC  Final   Culture NO GROWTH 3 DAYS  Final   Report Status PENDING  Incomplete  Culture, blood (single)     Status: None (Preliminary result)   Collection Time: 04/13/16  7:45 AM  Result Value Ref Range Status   Specimen Description BLOOD LEFT HAND  Final   Special Requests IN PEDIATRIC BOTTLE 3CC  Final   Culture NO GROWTH 1 DAY  Final   Report Status PENDING  Incomplete       Radiology Studies: No results found.   Scheduled Meds: . buPROPion  75 mg Oral Daily  . clotrimazole  10 mg Oral 5 X Daily  . diphenhydrAMINE  25 mg Oral BID  . enoxaparin (LOVENOX) injection  90 mg Subcutaneous Q24H  . ertapenem  1 g Intravenous QHS  . famotidine  20 mg Oral BID  . losartan  100 mg Oral Daily  . pantoprazole  80 mg Oral Daily  . saccharomyces boulardii  250 mg Oral BID  . topiramate  50 mg Oral BID  . vancomycin  1,250 mg Intravenous Q8H  . verapamil  120 mg Oral Daily   Continuous Infusions:    LOS: 3 days    Time spent: 30 min  Janece Canterbury, MD [facility] Pager [phone]  If 7PM-7AM, please contact night-coverage [email or web address] Password [email or web address] 04/14/2016, 4:20 PM

## 2016-04-14 NOTE — Progress Notes (Signed)
Pharmacy Antibiotic Note  Alexis [name] is a 30 y.o. male admitted on 04/11/2016 with MRSA bacteremia.    A Vancomycin trough this afternoon resulted as slightly SUPRAtherapeutic (VT 21 mcg/ml, goal of 15-20 mcg/ml). 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 Renal function stable - will reduce the Vancomycin dose slightly today.  Plan: 1. Adjust Vancomycin to 1250 mg IV every 8 hours 2. Will continue to follow renal function, culture results, LOT, and antibiotic de-escalation plans   Height: 6\' 6"  (198.1 cm) Weight: (!) 405 lb (183.707 kg) IBW/kg (Calculated) : 91.4  Temp (24hrs), Avg:99 F (37.2 C), Min:98.7 F (37.1 C), Max:99.3 F (37.4 C)   Recent Labs Lab 04/11/16 1954 04/13/16 0449 04/14/16 0447 04/14/16 1344  WBC 8.9  --   --   --   CREATININE 1.08  --  1.07  --   VANCOTROUGH  --  24*  --  21*    Estimated Creatinine Clearance: 183.2 mL/min (by C-G formula based on Cr of 1.07).    Allergies  Allergen Reactions  . Citrus Anaphylaxis and Other (See Comments)    Lemon  . Amoxicillin-Pot Clavulanate Nausea Only  . Sulfamethoxazole-Trimethoprim Other (See Comments)    Does not remember, long ago  . Vancomycin Other (See Comments)    Mild red mans syndrome, infuse more slowly.    Antimicrobials this admission: Ertapenem PTA 6/17 >> (planned 7/7)  Vanc 7/1 >>  Dose adjustments this admission: * 7/3 VT: 24 mcg/ml - corrects to ~20 mcg/ml >> no changes made, will repeat * 7/4 VT: 21 mcg/ml on 1500 mg/8h >> will adjust to 1250 mg/8h  Microbiology results: Outside BCx >> 2/2 MRSA (drawn via PICC) 7/1 BCx >> ngtd 7/3 BCx x 1 >> ngtd (No PICC tip cx noted as being done)  Thank you for allowing pharmacy to be a part of this patient's care.  Alycia Rossetti, PharmD, BCPS Clinical Pharmacist Pager: [phone] 04/14/2016 2:47 PM

## 2016-04-14 NOTE — Progress Notes (Signed)
Pt states he wants the PICC line placed by IR. States IR placed his last PICC and there were some complications but he would feel more comfortable with IR placing the PICC tomorrow. Arbie Cookey RN notified of this patients request. [place]

## 2016-04-15 ENCOUNTER — Inpatient Hospital Stay (HOSPITAL_COMMUNITY): Payer: BLUE CROSS/BLUE SHIELD

## 2016-04-15 ENCOUNTER — Encounter (HOSPITAL_COMMUNITY): Payer: Self-pay

## 2016-04-15 ENCOUNTER — Encounter (HOSPITAL_COMMUNITY)
Admission: EM | Disposition: A | Discharge status: Home Health | Payer: Self-pay | Source: Home / Self Care | Attending: Internal Medicine

## 2016-04-15 DIAGNOSIS — R079 Chest pain, unspecified: Secondary | ICD-10-CM

## 2016-04-15 DIAGNOSIS — I10 Essential (primary) hypertension: Secondary | ICD-10-CM

## 2016-04-15 DIAGNOSIS — K047 Periapical abscess without sinus: Secondary | ICD-10-CM

## 2016-04-15 HISTORY — PX: TEE WITHOUT CARDIOVERSION: SHX5443

## 2016-04-15 LAB — ECHO TEE
CHL CUP TV REG PEAK VELOCITY: 198 cm/s
TRMAXVEL: 198 cm/s

## 2016-04-15 SURGERY — ECHOCARDIOGRAM, TRANSESOPHAGEAL
Anesthesia: Moderate Sedation

## 2016-04-15 MED ORDER — DIPHENHYDRAMINE HCL 50 MG/ML IJ SOLN
INTRAMUSCULAR | Status: AC
Start: 2016-04-15 — End: 2016-04-15
  Filled 2016-04-15: qty 1

## 2016-04-15 MED ORDER — VANCOMYCIN HCL 10 G IV SOLR: 1250.0000 mg | Freq: Three times a day (TID) | INTRAVENOUS | Status: DC

## 2016-04-15 MED ORDER — MIDAZOLAM HCL 10 MG/2ML IJ SOLN
INTRAMUSCULAR | Status: DC | PRN
Start: 2016-04-15 — End: 2016-04-15
  Administered 2016-04-15: 1 mg via INTRAVENOUS
  Administered 2016-04-15 (×2): 2 mg via INTRAVENOUS

## 2016-04-15 MED ORDER — MIDAZOLAM HCL 5 MG/ML IJ SOLN
INTRAMUSCULAR | Status: AC
  Filled 2016-04-15: qty 2

## 2016-04-15 MED ORDER — SODIUM CHLORIDE 0.9 % IV SOLN: 1250.0000 mg | Freq: Three times a day (TID) | INTRAVENOUS | Status: DC

## 2016-04-15 MED ORDER — LIDOCAINE HCL 1 % IJ SOLN
INTRAMUSCULAR | Status: AC
  Administered 2016-04-15: 2 mL
  Filled 2016-04-15: qty 20

## 2016-04-15 MED ORDER — HEPARIN SOD (PORK) LOCK FLUSH 100 UNIT/ML IV SOLN
INTRAVENOUS | Status: AC
Start: 2016-04-15 — End: 2016-04-15
  Administered 2016-04-15: 500 [IU]
  Filled 2016-04-15: qty 5

## 2016-04-15 MED ORDER — DIPHENHYDRAMINE HCL 50 MG/ML IJ SOLN
INTRAMUSCULAR | Status: DC | PRN
  Administered 2016-04-15 (×2): 25 mg via INTRAVENOUS

## 2016-04-15 MED ORDER — HEPARIN SOD (PORK) LOCK FLUSH 100 UNIT/ML IV SOLN
250.0000 [IU] | INTRAVENOUS | Status: DC | PRN
Start: 2016-04-15 — End: 2016-04-15

## 2016-04-15 MED ORDER — FENTANYL CITRATE (PF) 100 MCG/2ML IJ SOLN
INTRAMUSCULAR | Status: AC
  Filled 2016-04-15: qty 4

## 2016-04-15 MED ORDER — FENTANYL CITRATE (PF) 100 MCG/2ML IJ SOLN
INTRAMUSCULAR | Status: DC | PRN
  Administered 2016-04-15: 25 ug via INTRAVENOUS

## 2016-04-15 MED ORDER — SACCHAROMYCES BOULARDII 250 MG PO CAPS: 250.0000 mg | ORAL_CAPSULE | Freq: Two times a day (BID) | ORAL | Status: DC

## 2016-04-15 MED ORDER — CLOTRIMAZOLE 10 MG MT TROC: 10.0000 mg | Freq: Every day | OROMUCOSAL | Status: DC

## 2016-04-15 NOTE — Op Note (Signed)
INDICATIONS: MRSA bacteremia  PROCEDURE:   Informed consent was obtained prior to the procedure. The risks, benefits and alternatives for the procedure were discussed and the patient comprehended these risks.  Risks include, but are not limited to, cough, sore throat, vomiting, nausea, somnolence, esophageal and stomach trauma or perforation, bleeding, low blood pressure, aspiration, pneumonia, infection, trauma to the teeth and death.    After a procedural time-out, the oropharynx was anesthetized with 20% benzocaine spray.   During this procedure the patient was administered a total of Benadryl 25 mg, Versed 5 mg and Fentanyl 50 mcg IV to achieve and maintain moderate conscious sedation.  The patient's heart rate, blood pressure, and oxygen saturationweare monitored continuously during the procedure. The period of conscious sedation was 10 minutes, of which I was present face-to-face 100% of this time.  The transesophageal probe was inserted in the esophagus and stomach without difficulty and multiple views were obtained.  The patient was kept under observation until the patient left the procedure room.  The patient left the procedure room in stable condition.   Agitated microbubble saline contrast was not administered.  COMPLICATIONS:    There were no immediate complications.  FINDINGS:  Normal TEE. No vegetations seen.    Time Spent Directly with the Patient:  30 minutes   Shawnee Gambone 04/15/2016, 11:35 AM

## 2016-04-15 NOTE — Procedures (Signed)
Interventional Radiology Procedure Note  Procedure: Placement of a right basilic vein approach single lumen PowerPicc.  Tip is positioned at the superior cavoatrial junction and catheter is ready for immediate use.  Complications: None Recommendations:  - ok to use - Do not submerge - Routine line care   Signed,  Dulcy Fanny. Earleen Newport, DO

## 2016-04-15 NOTE — Care Management Note (Addendum)
Case Management Note  Patient Details  Name: Alexis [name] MRN: [id number] Date of Birth: Jan 04, 1986  Subjective/Objective:                    Action/Plan: Called IR regarding PICC placement for discharge to home today , left two voice mails , awaiting call back.   Dr Posey Pronto has spoken to someone in IR and they are aware patient can be discharged today if PICC line placed.   Expected Discharge Date:  04/14/16               Expected Discharge Plan:  [place]  In-House Referral:     Discharge planning Services  CM Consult  Post Acute Care Choice:  Resumption of Svcs/PTA Provider, Home Health Choice offered to:  Patient  DME Arranged:    DME Agency:     HH Arranged:  RN [place] Agency:  [facility]  Status of Service:  Completed, signed off  If discussed at [place] of Stay Meetings, dates discussed:    Additional Comments:  Marilu Favre, RN 04/15/2016, 2:58 PM

## 2016-04-15 NOTE — Progress Notes (Signed)
Patient ID: Alexis [name], male   DOB: [birth year], 30 y.o.   MRN: [id number]         [facility]    Date of Admission:  04/11/2016           Day 19 ertapenem        Day 5 vancomycin  Principal Problem:   MRSA bacteremia Active Problems:   Acute maxillary sinusitis   Dental abscess   . buPROPion  75 mg Oral Daily  . clotrimazole  10 mg Oral 5 X Daily  . diphenhydrAMINE  25 mg Oral BID  . enoxaparin (LOVENOX) injection  90 mg Subcutaneous Q24H  . ertapenem  1 g Intravenous QHS  . famotidine  20 mg Oral BID  . losartan  100 mg Oral Daily  . pantoprazole  80 mg Oral Daily  . saccharomyces boulardii  250 mg Oral BID  . topiramate  50 mg Oral BID  . vancomycin  1,250 mg Intravenous Q8H  . verapamil  120 mg Oral Daily    SUBJECTIVE: He is feeling much better.   Review of Systems: Review of Systems  Constitutional: Negative for fever, chills, weight loss, malaise/fatigue and diaphoresis.  HENT: Negative for sore throat.   Respiratory: Negative for cough, sputum production and shortness of breath.   Cardiovascular: Negative for chest pain.  Gastrointestinal: Negative for nausea, vomiting, abdominal pain and diarrhea.  Genitourinary: Negative for dysuria and frequency.  Musculoskeletal: Negative for myalgias and joint pain.  Skin: Negative for itching and rash.  Neurological: Negative for headaches.    Past Medical History  Diagnosis Date  . ALLERGIC RHINITIS 06/10/2009  . ADD 09/13/2007  . ASTHMA, UNSPECIFIED, UNSPECIFIED STATUS 07/19/2008  . DEPRESSIVE DISORDER 09/13/2007  . DYSMETABOLIC SYNDROME 0000000  . GERD 09/13/2007  . HYPERLIPIDEMIA 10/10/2009  . HYPERTENSION 09/13/2007  . IBS 03/20/2009  . Morbid obesity ([place]) 07/19/2008  . Skin cancer   . Acid reflux     Social History  Substance Use Topics  . Smoking status: Never Smoker   . Smokeless tobacco: Never Used  . Alcohol Use: No     Comment: socially    Family History  Problem  Relation Age of Onset  . Hypertension Father   . Diabetes Father     type 2   . Emphysema Maternal Grandmother     smoker  . Asthma Maternal Grandmother   . Lung cancer Maternal Grandmother   . Allergies      whole family-both sides of family  . Asthma Mother   . Clotting disorder Mother     Factor 5  . Rheum arthritis Mother   . Skin cancer Mother   . Asthma Paternal Grandmother   . Diabetes Paternal Grandmother     type 2  . Heart disease      MGM deceased with MI; both sides of grandparents  . Clotting disorder Maternal Uncle     Factor 5   Allergies  Allergen Reactions  . Citrus Anaphylaxis and Other (See Comments)    Lemon  . Amoxicillin-Pot Clavulanate Nausea Only  . Sulfamethoxazole-Trimethoprim Other (See Comments)    Does not remember, long ago  . Vancomycin Other (See Comments)    Mild red mans syndrome, infuse more slowly.    OBJECTIVE: Filed Vitals:   04/15/16 1140 04/15/16 1150 04/15/16 1159 04/15/16 1210  BP: 111/67 119/65 116/65 112/80  Pulse: 90 90 81 99  Temp:   98.6 F (37 C)  TempSrc:   Oral   Resp: 12 24 23 22   Height:      Weight:      SpO2: 97% 95% 95% 95%   Body mass index is 46.81 kg/(m^2).  Physical Exam  Constitutional: He is oriented to person, place, and time.  He is a little sleepy after his TEE this morning.  HENT:  Mouth/Throat: No oropharyngeal exudate.  Cardiovascular: Normal rate and regular rhythm.   No murmur heard. Pulmonary/Chest: Effort normal and breath sounds normal. He has no wheezes. He has no rales.  Abdominal: Soft. There is no tenderness.  Neurological: He is alert and oriented to person, place, and time.  Skin: No rash noted.  Psychiatric: Mood and affect normal.    Lab Results Lab Results  Component Value Date   WBC 8.9 04/11/2016   HGB 13.8 04/11/2016   HCT 42.2 04/11/2016   MCV 81.0 04/11/2016   PLT 235 04/11/2016    Lab Results  Component Value Date   CREATININE 1.07 04/14/2016   BUN 10  04/14/2016   NA 138 04/14/2016   K 3.7 04/14/2016   CL 109 04/14/2016   CO2 24 04/14/2016    Lab Results  Component Value Date   ALT 34 04/11/2016   AST 26 04/11/2016   ALKPHOS 65 04/11/2016   BILITOT 1.1 04/11/2016     Microbiology: Recent Results (from the past 240 hour(s))  Culture, blood (routine x 2)     Status: None (Preliminary result)   Collection Time: 04/11/16  7:45 PM  Result Value Ref Range Status   Specimen Description BLOOD RIGHT PICC LINE  Final   Special Requests BOTTLES DRAWN AEROBIC AND ANAEROBIC 5CC  Final   Culture NO GROWTH 3 DAYS  Final   Report Status PENDING  Incomplete  Culture, blood (routine x 2)     Status: None (Preliminary result)   Collection Time: 04/11/16  7:53 PM  Result Value Ref Range Status   Specimen Description BLOOD LEFT FOREARM  Final   Special Requests BOTTLES DRAWN AEROBIC AND ANAEROBIC 5CC  Final   Culture NO GROWTH 3 DAYS  Final   Report Status PENDING  Incomplete  Culture, blood (single)     Status: None (Preliminary result)   Collection Time: 04/13/16  7:45 AM  Result Value Ref Range Status   Specimen Description BLOOD LEFT HAND  Final   Special Requests IN PEDIATRIC BOTTLE 3CC  Final   Culture NO GROWTH 1 DAY  Final   Report Status PENDING  Incomplete     ASSESSMENT: His sinusitis and dental infection appear to have resolved. I will stop ertapenem now. He developed PICC associated MRSA bacteremia. Repeat blood cultures here are negative and there is no evidence of endocarditis by TEE. I recommend a total of 2 weeks of vancomycin therapy.  PLAN: 1. Continue vancomycin 9 more days through 04/24/2016 2. Discontinue ertapenem 3. Okay for discharge after PICC placement 4. Follow-up in our clinic with Dr. Johnnye [name] on 05/01/2016 5. I will sign off now  Alexis Bickers, MD [facility] [place] [phone] pager   [phone] cell 04/15/2016, 2:05 PM

## 2016-04-15 NOTE — Progress Notes (Signed)
Discussed discharge summaries to pt. Meds reviewed with no questions asked. Pt sent home with PICC line for home IV ABT.

## 2016-04-15 NOTE — Progress Notes (Signed)
  Echocardiogram Echocardiogram Transesophageal has been performed.  Bobbye Charleston 04/15/2016, 11:41 AM

## 2016-04-16 ENCOUNTER — Encounter (HOSPITAL_COMMUNITY): Payer: Self-pay | Admitting: Cardiovascular Disease

## 2016-04-16 ENCOUNTER — Other Ambulatory Visit (HOSPITAL_COMMUNITY)
Admission: RE | Admit: 2016-04-16 | Discharge: 2016-04-16 | Disposition: A | Discharge status: Home / Self Care | Payer: BLUE CROSS/BLUE SHIELD | Source: Other Acute Inpatient Hospital | Attending: Infectious Diseases | Admitting: Infectious Diseases

## 2016-04-16 DIAGNOSIS — Z452 Encounter for adjustment and management of vascular access device: Secondary | ICD-10-CM | POA: Diagnosis not present

## 2016-04-16 DIAGNOSIS — Z5181 Encounter for therapeutic drug level monitoring: Secondary | ICD-10-CM | POA: Diagnosis not present

## 2016-04-16 DIAGNOSIS — J0100 Acute maxillary sinusitis, unspecified: Secondary | ICD-10-CM | POA: Insufficient documentation

## 2016-04-16 DIAGNOSIS — K047 Periapical abscess without sinus: Secondary | ICD-10-CM | POA: Diagnosis not present

## 2016-04-16 LAB — CULTURE, BLOOD (ROUTINE X 2)
CULTURE: NO GROWTH
Culture: NO GROWTH

## 2016-04-16 LAB — VANCOMYCIN, TROUGH: VANCOMYCIN TR: 7 ug/mL — AB (ref 15–20)

## 2016-04-16 NOTE — Discharge Summary (Signed)
[facility] Discharge Summary   Patient: Alexis [name] [id number]   PCP: Scarlette Calico, MD DOB: [birth year]   Date of admission: 04/11/2016   Date of discharge: 04/15/2016     Discharge Diagnoses:  Principal Problem:   MRSA bacteremia Active Problems:   GAD (generalized anxiety disorder)   Essential hypertension   Acute maxillary sinusitis   Dental abscess  Admitted From: Home Disposition:  Home health  Recommendations for Outpatient Follow-up:  1. Please follow-up with infectious disease as recommended. 2. Please follow-up with PCP   Follow-up Information    Follow up with Scarlette Calico, MD. Schedule an appointment as soon as possible for a visit in 1 week.   Specialty:  Internal Medicine   Contact information:   [address] [address] [phone]       Follow up with Scharlene Gloss, MD. Schedule an appointment as soon as possible for a visit on 05/01/2016.   Specialty:  Infectious Diseases   Contact information:   [address] [address] [phone]      Diet recommendation: Regular diet  Activity: The patient is advised to gradually reintroduce usual activities.  Discharge Condition: good  Code Status: Full code  History of present illness: As per the H and P dictated on admission, "Alexis [name] is a 30 y.o. male with medical history significant of dental abscess being treated with 3 weeks of invanz scheduled to end on 7/8. Patient [place] around [place] got seen in ED there. Cultures drawn from PICC. Patient got home today to mailbox and had letter that cultures were positive for GPC in C. Dr. Linus Salmons [place] and confirmed that cultures were growing MRSA. Patient sent in to ED"  Hospital Course:  Summary of his active problems in the hospital is as following. MRSA bacteremia secondary to PICC line infection -Old PICC line was removed on  7/1 -Continue vancomycin, 2 weeks treatment course, last dose will be on 04/24/2016. Patient will follow-up with ID on 05/01/2016. - Blood cultures from admission NGTD - Echocardiogram and TEE without evidence of vegetation - New PICC line placed on 04/15/2016. - Appreciate infectious disease assistance  Dental abscess, - improving, completed course with Invanz  Essential hypertension. Continue home medication.   All other chronic medical condition were stable during the hospitalization.  Patient was ambulatory without any assistance. On the day of the discharge the patient's vitals are stable, and no other acute medical condition were reported by patient. the patient was felt safe to be discharge at home with home health.  Procedures and Results:  PICC line removal 04/11/2016  PICC line placement 04/15/2016  Echocardiogram Study Conclusions  - Left ventricle: The cavity size was mildly dilated. There was  mild focal basal hypertrophy of the septum. Systolic function was  normal. The estimated ejection fraction was in the range of 55%  to 60%. Wall motion was normal; there were no regional wall  motion abnormalities. Doppler parameters are consistent with  abnormal left ventricular relaxation (grade 1 diastolic  dysfunction). - Aortic root: The aortic root was normal in size.  Impressions:  - Normal LV systolic function; grade 1 diastolic dysfunction;  trace TR.   TEE  Study Conclusions  - Left ventricle: Systolic function was normal. The estimated  ejection fraction was in the range of 60% to 65%. Wall motion was  normal; there were  no regional wall motion abnormalities. - Left atrium: No evidence of thrombus in the atrial cavity or  appendage. - Right atrium: No evidence of thrombus in the atrial cavity or  appendage.  Impressions:  - No evidence of endocarditis. There was no evidence of a  vegetation  Consultations:  Infectious  disease  Cardiology  Interventional radiology  DISCHARGE MEDICATION: Discharge Medication List as of 04/15/2016  7:09 PM    START taking these medications   Details  clotrimazole (MYCELEX) 10 MG troche Take 1 tablet (10 mg total) by mouth 5 (five) times daily., Starting 04/15/2016, Until Fri 04/24/16, Normal    saccharomyces boulardii (FLORASTOR) 250 MG capsule Take 1 capsule (250 mg total) by mouth 2 (two) times daily., Starting 04/15/2016, Until Discontinued, Normal      CONTINUE these medications which have CHANGED   Details  vancomycin 1,250 mg in sodium chloride 0.9 % 250 mL Inject 1,250 mg into the vein every 8 (eight) hours., Starting 04/15/2016, Until Fri 04/24/16, Print      CONTINUE these medications which have NOT CHANGED   Details  ALPRAZolam (XANAX) 1 MG tablet Take 1 mg by mouth 2 (two) times daily as needed for anxiety., Until Discontinued, Historical Med    buPROPion (WELLBUTRIN) 75 MG tablet Take 1 tablet (75 mg total) by mouth daily., Starting 09/09/2015, Until Discontinued, Normal    celecoxib (CELEBREX) 200 MG capsule TAKE ONE CAPSULE BY MOUTH EVERY DAY, Normal    diphenhydrAMINE (BENADRYL) 25 MG tablet Take 25 mg by mouth 2 (two) times daily., Until Discontinued, Historical Med    famotidine (PEPCID) 20 MG tablet Take 20 mg by mouth 2 (two) times daily., Starting 04/07/2016, Until Discontinued, Historical Med    losartan (COZAAR) 100 MG tablet Take 1 tablet (100 mg total) by mouth daily., Starting 09/09/2015, Until Discontinued, Normal    Nutritional Supplements (JUICE PLUS FIBRE PO) Take 1 capsule by mouth daily., Until Discontinued, Historical Med    omeprazole (PRILOSEC) 40 MG capsule TAKE 1 CAPSULE BY MOUTH TWICE A DAY, Normal    topiramate (TOPAMAX) 50 MG tablet Take 1 tablet (50 mg total) by mouth 2 (two) times daily., Starting 05/16/2015, Until Discontinued, Normal    verapamil (CALAN-SR) 120 MG CR tablet Take 1 tablet by mouth daily., Starting 03/12/2016, Until  Discontinued, Historical Med    EPINEPHrine (EPI-PEN) 0.3 mg/0.3 mL DEVI Inject 0.3 mLs (0.3 mg total) into the muscle once., Starting 02/23/2013, Print      STOP taking these medications     Ertapenem Sodium (INVANZ IV)        Allergies  Allergen Reactions  . Citrus Anaphylaxis and Other (See Comments)    Lemon  . Amoxicillin-Pot Clavulanate Nausea Only  . Sulfamethoxazole-Trimethoprim Other (See Comments)    Does not remember, long ago  . Vancomycin Other (See Comments)    Mild red mans syndrome, infuse more slowly.   Discharge Instructions    Diet - low sodium heart healthy    Complete by:  As directed      Discharge instructions    Complete by:  As directed   It is important that you read following instructions as well as go over your medication list with RN to help you understand your care after this hospitalization.  Discharge Instructions: Please follow-up with PCP in one week  Please request your primary care physician to go over all Hospital Tests and Procedure/Radiological results at the follow up,  Please get all Hospital records sent to your  PCP by signing hospital release before you go home.   Do not take more than prescribed Pain, Sleep and Anxiety Medications. You were cared for by a hospitalist during your hospital stay. If you have any questions about your discharge medications or the care you received while you were in the hospital after you are discharged, you can call the unit and ask to speak with the hospitalist on call if the hospitalist that took care of you is not available.  Once you are discharged, your primary care physician will handle any further medical issues. Please note that NO REFILLS for any discharge medications will be authorized once you are discharged, as it is imperative that you return to your primary care physician (or establish a relationship with a primary care physician if you do not have one) for your aftercare needs so that they can  reassess your need for medications and monitor your lab values. You Must read complete instructions/literature along with all the possible adverse reactions/side effects for all the Medicines you take and that have been prescribed to you. Take any new Medicines after you have completely understood and accept all the possible adverse reactions/side effects. Wear Seat belts while driving. If you have smoked or chewed Tobacco in the last 2 yrs please stop smoking and/or stop any Recreational drug use.     Increase activity slowly    Complete by:  As directed           Discharge Exam: Filed Weights   04/11/16 1803 04/12/16 0105  Weight: 182.8 kg (403 lb) 183.707 kg (405 lb)   Filed Vitals:   04/15/16 1557 04/15/16 1839  BP: 120/72 138/97  Pulse: 90 90  Temp: 99.6 F (37.6 C) 98.8 F (37.1 C)  Resp: 20 20   General: Appear in no distress, no Rash; Oral Mucosa moist. Cardiovascular: S1 and S2 Present, no Murmur, no JVD Respiratory: Bilateral Air entry present and Clear to Auscultation, no Crackles, no wheezes Abdomen: Bowel Sound present, Soft and no tenderness Extremities: no Pedal edema, no calf tenderness Neurology: Grossly no focal neuro deficit.  The results of significant diagnostics from this hospitalization (including imaging, microbiology, ancillary and laboratory) are listed below for reference.    Significant Diagnostic Studies: Dg Chest 2 View  03/28/2016  CLINICAL DATA:  Status post PICC placement. EXAM: CHEST  2 VIEW COMPARISON:  Radiograph of October 10, 2014. FINDINGS: The heart size and mediastinal contours are within normal limits. Both lungs are clear. No pneumothorax or pleural effusion is noted. Interval placement of right-sided PICC line with distal tip in expected position of SVC. The visualized skeletal structures are unremarkable. IMPRESSION: No active cardiopulmonary disease. Interval placement of right-sided PICC line with distal tip in expected position of  SVC. Electronically Signed   By: Marijo Conception, M.D.   On: 03/28/2016 16:30   Ir Fluoro Guide Cv Line Right  04/15/2016  INDICATION: 30 year old male with a history of bacteremia. He presents for PICC placement EXAM: RIGHT PICC LINE PLACEMENT WITH ULTRASOUND AND FLUOROSCOPIC GUIDANCE MEDICATIONS: None ANESTHESIA/SEDATION: None FLUOROSCOPY TIME:  Fluoroscopy Time: 0 minutes 24 seconds (10.0 mGy). COMPLICATIONS: None PROCEDURE: The patient was advised of the possible risks and complications and agreed to undergo the procedure. The patient was then brought to the angiographic suite for the procedure. The right arm was prepped with chlorhexidine, draped in the usual sterile fashion using maximum barrier technique (cap and mask, sterile gown, sterile gloves, large sterile sheet, hand hygiene and cutaneous  antisepsis) and infiltrated locally with 1% Lidocaine. Ultrasound demonstrated patency of the right basilic vein, and this was documented with an image. Under real-time ultrasound guidance, this vein was accessed with a 21 gauge micropuncture needle and image documentation was performed. A 0.018 wire was introduced in to the vein. Over this, a 5 [place] single lumen power injectable PICC was advanced to the lower SVC/right atrial junction. Fluoroscopy during the procedure and fluoro spot radiograph confirms appropriate catheter position. The catheter was flushed and covered with asterile dressing. Catheter length: 39 cm IMPRESSION: Status post placement of right basilic vein single-lumen power injectable PICC, measuring 39 cm. Catheter ready for use. Signed, Dulcy Fanny. Earleen Newport, DO [facility] Electronically Signed   By: Corrie Mckusick D.O.   On: 04/15/2016 15:47   Ir Fluoro Guide Cv Line Right  03/27/2016  INDICATION: Dental infection. Need for central venous access for long term intravenous antibiotics. EXAM: RIGHT UPPER EXTREMITY PICC LINE PLACEMENT WITH  ULTRASOUND AND FLUOROSCOPIC GUIDANCE MEDICATIONS: 1% Lidocaine. ANESTHESIA/SEDATION: No sedation medication given. FLUOROSCOPY TIME:  Fluoroscopy Time: 0 minutes 24 seconds. COMPLICATIONS: None immediate. PROCEDURE: The patient was advised of the possible risks and complications and agreed to undergo the procedure. The patient was then brought to the angiographic suite for the procedure. The right arm was prepped with chlorhexidine, draped in the usual sterile fashion using maximum barrier technique (cap and mask, sterile gown, sterile gloves, large sterile sheet, hand hygiene and cutaneous antisepsis) and infiltrated locally with 1% Lidocaine. Ultrasound demonstrated patency of the right basilic vein, and this was documented with an image. Under real-time ultrasound guidance, this vein was accessed with a 21 gauge micropuncture needle and image documentation was performed. A 0.018 wire was introduced in to the vein. Over this, a 5 [place] single lumen power-injectable PICC was advanced to the lower SVC/right atrial junction. Fluoroscopy during the procedure and fluoro spot radiograph confirms appropriate catheter position. The catheter was flushed and covered with a sterile dressing. Catheter length:  45 cm IMPRESSION: Successful right arm Power PICC line placement with ultrasound and fluoroscopic guidance. The catheter is ready for use. Read by:  Gareth Eagle, PA-C Electronically Signed   By: Corrie Mckusick D.O.   On: 03/27/2016 13:26   Ir US Guide Vasc Access Right  04/15/2016  INDICATION: 30 year old male with a history of bacteremia. He presents for PICC placement EXAM: RIGHT PICC LINE PLACEMENT WITH ULTRASOUND AND FLUOROSCOPIC GUIDANCE MEDICATIONS: None ANESTHESIA/SEDATION: None FLUOROSCOPY TIME:  Fluoroscopy Time: 0 minutes 24 seconds (10.0 mGy). COMPLICATIONS: None PROCEDURE: The patient was advised of the possible risks and complications and agreed to undergo the procedure. The patient was then brought to  the angiographic suite for the procedure. The right arm was prepped with chlorhexidine, draped in the usual sterile fashion using maximum barrier technique (cap and mask, sterile gown, sterile gloves, large sterile sheet, hand hygiene and cutaneous antisepsis) and infiltrated locally with 1% Lidocaine. Ultrasound demonstrated patency of the right basilic vein, and this was documented with an image. Under real-time ultrasound guidance, this vein was accessed with a 21 gauge micropuncture needle and image documentation was performed. A 0.018 wire was introduced in to the vein. Over this, a 5 [place] single lumen power injectable PICC was advanced to the lower SVC/right atrial junction. Fluoroscopy during the procedure and fluoro spot radiograph confirms appropriate catheter position. The catheter was flushed and covered with asterile dressing. Catheter length: 39 cm IMPRESSION: Status post placement of right basilic  vein single-lumen power injectable PICC, measuring 39 cm. Catheter ready for use. Signed, Dulcy Fanny. Earleen Newport, DO [facility] Electronically Signed   By: Corrie Mckusick D.O.   On: 04/15/2016 15:47   Ir US Guide Vasc Access Right  03/27/2016  INDICATION: Dental infection. Need for central venous access for long term intravenous antibiotics. EXAM: RIGHT UPPER EXTREMITY PICC LINE PLACEMENT WITH ULTRASOUND AND FLUOROSCOPIC GUIDANCE MEDICATIONS: 1% Lidocaine. ANESTHESIA/SEDATION: No sedation medication given. FLUOROSCOPY TIME:  Fluoroscopy Time: 0 minutes 24 seconds. COMPLICATIONS: None immediate. PROCEDURE: The patient was advised of the possible risks and complications and agreed to undergo the procedure. The patient was then brought to the angiographic suite for the procedure. The right arm was prepped with chlorhexidine, draped in the usual sterile fashion using maximum barrier technique (cap and mask, sterile gown, sterile gloves, large sterile sheet,  hand hygiene and cutaneous antisepsis) and infiltrated locally with 1% Lidocaine. Ultrasound demonstrated patency of the right basilic vein, and this was documented with an image. Under real-time ultrasound guidance, this vein was accessed with a 21 gauge micropuncture needle and image documentation was performed. A 0.018 wire was introduced in to the vein. Over this, a 5 [place] single lumen power-injectable PICC was advanced to the lower SVC/right atrial junction. Fluoroscopy during the procedure and fluoro spot radiograph confirms appropriate catheter position. The catheter was flushed and covered with a sterile dressing. Catheter length:  45 cm IMPRESSION: Successful right arm Power PICC line placement with ultrasound and fluoroscopic guidance. The catheter is ready for use. Read by:  Gareth Eagle, PA-C Electronically Signed   By: Corrie Mckusick D.O.   On: 03/27/2016 13:26   Ct Maxillofacial Wo Cm  03/26/2016  CLINICAL DATA:  Acute sinusitis.  Ethmoid and sphenoid sinusitis EXAM: CT MAXILLOFACIAL WITHOUT CONTRAST TECHNIQUE: Multidetector CT imaging of the maxillofacial structures was performed. Multiplanar CT image reconstructions were also generated. A small metallic BB was placed on the right temple in order to reliably differentiate right from left. COMPARISON:  CT head 11/13/2014 FINDINGS: Moderate mucosal edema right maxillary sinus. Obstruction of the ostium due to mucosal edema on the right. No air-fluid level Mild mucosal edema left maxillary sinus with narrowing of the ostiomeatal complex due to mucosal edema. Moderate mucosal edema in the ethmoid sinuses bilaterally has progressed. Mild mucosal edema in the frontal sinuses. Sphenoid sinus now clear. Air-fluid level noted previously in the left sphenoid sinus. Nasal septum mildly deviated to the right. No acute bony abnormality. Mastoid sinus and middle ear clear bilaterally Regional soft tissues demonstrate no significant abnormality IMPRESSION:  Resolution of air-fluid level left sphenoid sinus Progression of mucosal edema in the right maxillary sinus since the prior study. Progression of bilateral ethmoid mucosal edema. Electronically Signed   By: Franchot Gallo M.D.   On: 03/26/2016 16:06    Microbiology: Recent Results (from the past 240 hour(s))  Culture, blood (routine x 2)     Status: None (Preliminary result)   Collection Time: 04/11/16  7:45 PM  Result Value Ref Range Status   Specimen Description BLOOD RIGHT PICC LINE  Final   Special Requests BOTTLES DRAWN AEROBIC AND ANAEROBIC 5CC  Final   Culture NO GROWTH 4 DAYS  Final   Report Status PENDING  Incomplete  Culture, blood (routine x 2)     Status: None (Preliminary result)   Collection Time: 04/11/16  7:53 PM  Result Value Ref Range Status   Specimen Description BLOOD LEFT FOREARM  Final  Special Requests BOTTLES DRAWN AEROBIC AND ANAEROBIC 5CC  Final   Culture NO GROWTH 4 DAYS  Final   Report Status PENDING  Incomplete  Culture, blood (single)     Status: None (Preliminary result)   Collection Time: 04/13/16  7:45 AM  Result Value Ref Range Status   Specimen Description BLOOD LEFT HAND  Final   Special Requests IN PEDIATRIC BOTTLE 3CC  Final   Culture NO GROWTH 2 DAYS  Final   Report Status PENDING  Incomplete     Labs: CBC:  Recent Labs Lab 04/11/16 1954  WBC 8.9  NEUTROABS 5.3  HGB 13.8  HCT 42.2  MCV 81.0  PLT [id number]   Basic Metabolic Panel:  Recent Labs Lab 04/11/16 1954 04/14/16 0447  NA 138 138  K 3.6 3.7  CL 108 109  CO2 24 24  GLUCOSE 100* 84  BUN 10 10  CREATININE 1.08 1.07  CALCIUM 8.8* 8.9   Liver Function Tests:  Recent Labs Lab 04/11/16 1954  AST 26  ALT 34  ALKPHOS 65  BILITOT 1.1  PROT 6.7  ALBUMIN 3.3*   Time spent: 30 minutes  Signed:  Brydon Spahr  [facility] 04/15/2016   , 8:03 AM

## 2016-04-18 LAB — CULTURE, BLOOD (SINGLE): Culture: NO GROWTH

## 2016-04-19 ENCOUNTER — Other Ambulatory Visit (HOSPITAL_COMMUNITY)
Admission: RE | Admit: 2016-04-19 | Discharge: 2016-04-19 | Disposition: A | Discharge status: Home / Self Care | Payer: BLUE CROSS/BLUE SHIELD | Source: Other Acute Inpatient Hospital | Attending: Internal Medicine | Admitting: Internal Medicine

## 2016-04-19 DIAGNOSIS — Z5181 Encounter for therapeutic drug level monitoring: Secondary | ICD-10-CM | POA: Insufficient documentation

## 2016-04-19 DIAGNOSIS — Z452 Encounter for adjustment and management of vascular access device: Secondary | ICD-10-CM | POA: Diagnosis not present

## 2016-04-19 LAB — CBC
HEMATOCRIT: 41.4 % (ref 39.0–52.0)
HEMOGLOBIN: 13.5 g/dL (ref 13.0–17.0)
MCH: 26.7 pg (ref 26.0–34.0)
MCHC: 32.6 g/dL (ref 30.0–36.0)
MCV: 82 fL (ref 78.0–100.0)
Platelets: 230 10*3/uL (ref 150–400)
RBC: 5.05 MIL/uL (ref 4.22–5.81)
RDW: 14.3 % (ref 11.5–15.5)
WBC: 8 10*3/uL (ref 4.0–10.5)

## 2016-04-19 LAB — BASIC METABOLIC PANEL
ANION GAP: 5 (ref 5–15)
BUN: 13 mg/dL (ref 6–20)
CALCIUM: 8.4 mg/dL — AB (ref 8.9–10.3)
CO2: 23 mmol/L (ref 22–32)
CREATININE: 1.07 mg/dL (ref 0.61–1.24)
Chloride: 108 mmol/L (ref 101–111)
GFR calc non Af Amer: >60 mL/min (ref >60)
Glucose, Bld: 112 mg/dL — ABNORMAL HIGH (ref 65–99)
Potassium: 4 mmol/L (ref 3.5–5.1)
SODIUM: 136 mmol/L (ref 135–145)

## 2016-04-19 LAB — C-REACTIVE PROTEIN: CRP: 2 mg/dL — AB (ref <1.0)

## 2016-04-19 LAB — VANCOMYCIN, TROUGH: VANCOMYCIN TR: 10 ug/mL — AB (ref 15–20)

## 2016-04-21 ENCOUNTER — Telehealth: Payer: Self-pay | Admitting: *Deleted

## 2016-04-21 DIAGNOSIS — M9901 Segmental and somatic dysfunction of cervical region: Secondary | ICD-10-CM | POA: Diagnosis not present

## 2016-04-21 DIAGNOSIS — M545 Low back pain: Secondary | ICD-10-CM | POA: Diagnosis not present

## 2016-04-21 DIAGNOSIS — M9903 Segmental and somatic dysfunction of lumbar region: Secondary | ICD-10-CM | POA: Diagnosis not present

## 2016-04-21 DIAGNOSIS — M542 Cervicalgia: Secondary | ICD-10-CM | POA: Diagnosis not present

## 2016-04-21 NOTE — Telephone Encounter (Signed)
Patient's mother called to notify that his end date for IV medication is Saturday 04/25/16 and would like an order to pull the picc line. His office visit with Dr. Johnnye Sima is 05/01/16. Patient's Mom states his skin is "breaking down at picc site" and it is very itchy. Spoke to [place] with [place] and a shipment of vancomycin was sent out today and right now he is getting 2250 grams every twelve hours. Please advise on order to pull picc at end of treatment.

## 2016-04-21 NOTE — Telephone Encounter (Addendum)
Verbal order per Dr. Johnnye Sima given to [facility] at [place] to pull patient's picc line on 04/25/16 after last dose. Mother notified

## 2016-04-22 ENCOUNTER — Encounter (HOSPITAL_COMMUNITY): Payer: Self-pay

## 2016-04-22 ENCOUNTER — Telehealth: Payer: Self-pay | Admitting: *Deleted

## 2016-04-22 ENCOUNTER — Inpatient Hospital Stay (HOSPITAL_COMMUNITY)
Admission: EM | Admit: 2016-04-22 | Discharge: 2016-04-25 | DRG: 683 | Disposition: A | Discharge status: Home Health | Payer: BLUE CROSS/BLUE SHIELD | Attending: Internal Medicine | Admitting: Internal Medicine

## 2016-04-22 ENCOUNTER — Emergency Department (HOSPITAL_COMMUNITY): Payer: BLUE CROSS/BLUE SHIELD

## 2016-04-22 DIAGNOSIS — T39395A Adverse effect of other nonsteroidal anti-inflammatory drugs [NSAID], initial encounter: Secondary | ICD-10-CM | POA: Diagnosis present

## 2016-04-22 DIAGNOSIS — T368X5A Adverse effect of other systemic antibiotics, initial encounter: Secondary | ICD-10-CM | POA: Diagnosis present

## 2016-04-22 DIAGNOSIS — N179 Acute kidney failure, unspecified: Secondary | ICD-10-CM | POA: Diagnosis present

## 2016-04-22 DIAGNOSIS — T465X5A Adverse effect of other antihypertensive drugs, initial encounter: Secondary | ICD-10-CM | POA: Diagnosis present

## 2016-04-22 DIAGNOSIS — R6510 Systemic inflammatory response syndrome (SIRS) of non-infectious origin without acute organ dysfunction: Secondary | ICD-10-CM | POA: Diagnosis not present

## 2016-04-22 DIAGNOSIS — E785 Hyperlipidemia, unspecified: Secondary | ICD-10-CM | POA: Diagnosis present

## 2016-04-22 DIAGNOSIS — Z880 Allergy status to penicillin: Secondary | ICD-10-CM | POA: Diagnosis not present

## 2016-04-22 DIAGNOSIS — Z6841 Body Mass Index (BMI) 40.0 and over, adult: Secondary | ICD-10-CM

## 2016-04-22 DIAGNOSIS — Z79899 Other long term (current) drug therapy: Secondary | ICD-10-CM

## 2016-04-22 DIAGNOSIS — Z882 Allergy status to sulfonamides status: Secondary | ICD-10-CM

## 2016-04-22 DIAGNOSIS — K047 Periapical abscess without sinus: Secondary | ICD-10-CM | POA: Diagnosis not present

## 2016-04-22 DIAGNOSIS — R7881 Bacteremia: Secondary | ICD-10-CM | POA: Diagnosis present

## 2016-04-22 DIAGNOSIS — J4530 Mild persistent asthma, uncomplicated: Secondary | ICD-10-CM | POA: Diagnosis present

## 2016-04-22 DIAGNOSIS — Z91018 Allergy to other foods: Secondary | ICD-10-CM | POA: Diagnosis not present

## 2016-04-22 DIAGNOSIS — R110 Nausea: Secondary | ICD-10-CM | POA: Diagnosis not present

## 2016-04-22 DIAGNOSIS — K219 Gastro-esophageal reflux disease without esophagitis: Secondary | ICD-10-CM | POA: Diagnosis present

## 2016-04-22 DIAGNOSIS — I10 Essential (primary) hypertension: Secondary | ICD-10-CM | POA: Diagnosis not present

## 2016-04-22 DIAGNOSIS — R509 Fever, unspecified: Secondary | ICD-10-CM | POA: Diagnosis present

## 2016-04-22 DIAGNOSIS — B9562 Methicillin resistant Staphylococcus aureus infection as the cause of diseases classified elsewhere: Secondary | ICD-10-CM | POA: Diagnosis not present

## 2016-04-22 DIAGNOSIS — R05 Cough: Secondary | ICD-10-CM | POA: Diagnosis not present

## 2016-04-22 LAB — COMPREHENSIVE METABOLIC PANEL
ALBUMIN: 3.5 g/dL (ref 3.5–5.0)
ALK PHOS: 67 U/L (ref 38–126)
ALT: 40 U/L (ref 17–63)
ANION GAP: 9 (ref 5–15)
AST: 24 U/L (ref 15–41)
BUN: 6 mg/dL (ref 6–20)
CALCIUM: 8.9 mg/dL (ref 8.9–10.3)
CO2: 22 mmol/L (ref 22–32)
CREATININE: 1.27 mg/dL — AB (ref 0.61–1.24)
Chloride: 106 mmol/L (ref 101–111)
GFR calc Af Amer: >60 mL/min (ref >60)
GFR calc non Af Amer: >60 mL/min (ref >60)
Glucose, Bld: 92 mg/dL (ref 65–99)
POTASSIUM: 3.6 mmol/L (ref 3.5–5.1)
SODIUM: 137 mmol/L (ref 135–145)
Total Bilirubin: 1.1 mg/dL (ref 0.3–1.2)
Total Protein: 7.1 g/dL (ref 6.5–8.1)

## 2016-04-22 LAB — CBC WITH DIFFERENTIAL/PLATELET
Basophils Absolute: 0.1 10*3/uL (ref 0.0–0.1)
Basophils Relative: 1 %
EOS ABS: 0.2 10*3/uL (ref 0.0–0.7)
EOS PCT: 2 %
HCT: 44.1 % (ref 39.0–52.0)
Hemoglobin: 14.2 g/dL (ref 13.0–17.0)
LYMPHS ABS: 1.7 10*3/uL (ref 0.7–4.0)
Lymphocytes Relative: 21 %
MCH: 26.2 pg (ref 26.0–34.0)
MCHC: 32.2 g/dL (ref 30.0–36.0)
MCV: 81.4 fL (ref 78.0–100.0)
Monocytes Absolute: 1.1 10*3/uL — ABNORMAL HIGH (ref 0.1–1.0)
Monocytes Relative: 13 %
Neutro Abs: 5.1 10*3/uL (ref 1.7–7.7)
Neutrophils Relative %: 63 %
PLATELETS: 228 10*3/uL (ref 150–400)
RBC: 5.42 MIL/uL (ref 4.22–5.81)
RDW: 14.3 % (ref 11.5–15.5)
WBC: 8.1 10*3/uL (ref 4.0–10.5)

## 2016-04-22 LAB — I-STAT CG4 LACTIC ACID, ED: Lactic Acid, Venous: 1.33 mmol/L (ref 0.5–1.9)

## 2016-04-22 MED ORDER — ONDANSETRON HCL 4 MG/2ML IJ SOLN
4.0000 mg | Freq: Once | INTRAMUSCULAR | Status: DC
  Filled 2016-04-22 (×2): qty 2

## 2016-04-22 MED ORDER — SODIUM CHLORIDE 0.9 % IV SOLN: 2250.0000 mg | Freq: Once | INTRAVENOUS | Status: DC

## 2016-04-22 MED ORDER — ACETAMINOPHEN 325 MG PO TABS
ORAL_TABLET | ORAL | Status: AC
  Filled 2016-04-22: qty 2

## 2016-04-22 MED ORDER — DIPHENHYDRAMINE HCL 50 MG/ML IJ SOLN
25.0000 mg | Freq: Once | INTRAMUSCULAR | Status: AC
  Administered 2016-04-22: 25 mg via INTRAVENOUS
  Filled 2016-04-22: qty 1

## 2016-04-22 MED ORDER — ACETAMINOPHEN 325 MG PO TABS
650.0000 mg | ORAL_TABLET | Freq: Once | ORAL | Status: AC
  Administered 2016-04-22: 650 mg via ORAL

## 2016-04-22 MED ORDER — PIPERACILLIN-TAZOBACTAM 3.375 G IVPB 30 MIN
3.3750 g | Freq: Once | INTRAVENOUS | Status: AC
  Administered 2016-04-22: 3.375 g via INTRAVENOUS
  Filled 2016-04-22: qty 50

## 2016-04-22 MED ORDER — VANCOMYCIN HCL 10 G IV SOLR
2250.0000 mg | Freq: Once | INTRAVENOUS | Status: AC
  Administered 2016-04-22: 2250 mg via INTRAVENOUS
  Filled 2016-04-22: qty 2250

## 2016-04-22 NOTE — ED Provider Notes (Signed)
CSN: [id number]     Arrival date & time 04/22/16  1734 History   First MD Initiated Contact with Patient 04/22/16 2033     No chief complaint on file.    (Consider location/radiation/quality/duration/timing/severity/associated sxs/prior Treatment) HPI Patient's a 30 year old male with a complicated history. He was diagnosed with a tooth/maxillary sinus infection in early June and was started on clindamycin. After a week of clindamycin the infection seemed to be worsening and he was placed on ertapenem at the recommendation of infectious disease. After 2.5 on Ertapenem, he was found to have a PICC infection and MRSA bacteremia. He was then started on daily vancomycin on July 1. He has been feeling well until today when he awoke with chills, fatigue, nausea, and muscle aches and reports a temperature of 101.7 at home. He has been taking his vancomycin as prescribed. No specific exacerbating or relieving factors.  Past Medical History  Diagnosis Date  . ALLERGIC RHINITIS 06/10/2009  . ADD 09/13/2007  . ASTHMA, UNSPECIFIED, UNSPECIFIED STATUS 07/19/2008  . DEPRESSIVE DISORDER 09/13/2007  . DYSMETABOLIC SYNDROME 0000000  . GERD 09/13/2007  . HYPERLIPIDEMIA 10/10/2009  . HYPERTENSION 09/13/2007  . IBS 03/20/2009  . Morbid obesity ([place]) 07/19/2008  . Skin cancer   . Acid reflux    Past Surgical History  Procedure Laterality Date  . Appendectomy  2005  . Ankle surgery  2007  . Skin cancer excision      teenager  . Tee without cardioversion N/A 04/15/2016    Procedure: TRANSESOPHAGEAL ECHOCARDIOGRAM (TEE);  Surgeon: Sanda Klein, MD;  Location: [facility] ENDOSCOPY;  Service: Cardiovascular;  Laterality: N/A;   Family History  Problem Relation Age of Onset  . Hypertension Father   . Diabetes Father     type 2   . Emphysema Maternal Grandmother     smoker  . Asthma Maternal Grandmother   . Lung cancer Maternal Grandmother   . Allergies      whole family-both sides of family  . Asthma Mother   .  Clotting disorder Mother     Factor 5  . Rheum arthritis Mother   . Skin cancer Mother   . Asthma Paternal Grandmother   . Diabetes Paternal Grandmother     type 2  . Heart disease      MGM deceased with MI; both sides of grandparents  . Clotting disorder Maternal Uncle     Factor 5   Social History  Substance Use Topics  . Smoking status: Never Smoker   . Smokeless tobacco: Never Used  . Alcohol Use: No     Comment: socially    Review of Systems  Constitutional: Positive for chills, diaphoresis and fatigue. Negative for fever.  HENT: Negative for congestion.   Eyes: Negative for visual disturbance.  Respiratory: Positive for cough (chronic, dry, sputtering). Negative for chest tightness and shortness of breath.   Cardiovascular: Negative for chest pain.  Gastrointestinal: Positive for nausea. Negative for vomiting, abdominal pain, diarrhea and constipation.  Genitourinary: Negative for dysuria and flank pain.  Musculoskeletal: Negative for neck pain.  Skin: Negative for rash.  Neurological: Negative for weakness, numbness and headaches.  Psychiatric/Behavioral: Negative for confusion and agitation.      Allergies  Citrus; Amoxicillin-pot clavulanate; Sulfamethoxazole-trimethoprim; and Vancomycin  Home Medications   Prior to Admission medications   Medication Sig Start Date End Date Taking? Authorizing Provider  ALPRAZolam Duanne Moron) 1 MG tablet Take 1 mg by mouth 2 (two) times daily as needed for anxiety.   Yes  Historical Provider, MD  buPROPion (WELLBUTRIN) 75 MG tablet Take 1 tablet (75 mg total) by mouth daily. 09/09/15  Yes Janith Lima, MD  celecoxib (CELEBREX) 200 MG capsule TAKE ONE CAPSULE BY MOUTH EVERY DAY Patient taking differently: Take 200 mg by mouth daily as needed for moderate pain.  09/09/15  Yes Janith Lima, MD  clotrimazole (MYCELEX) 10 MG troche Take 1 tablet (10 mg total) by mouth 5 (five) times daily. 04/15/16 04/24/16 Yes Lavina Hamman, MD   diphenhydrAMINE (BENADRYL) 25 MG tablet Take 25 mg by mouth 2 (two) times daily.   Yes Historical Provider, MD  EPINEPHrine (EPI-PEN) 0.3 mg/0.3 mL DEVI Inject 0.3 mLs (0.3 mg total) into the muscle once. 02/23/13  Yes Deneise Lever, MD  famotidine (PEPCID) 20 MG tablet Take 20 mg by mouth 2 (two) times daily. 04/07/16  Yes Historical Provider, MD  losartan (COZAAR) 100 MG tablet Take 1 tablet (100 mg total) by mouth daily. 09/09/15  Yes Janith Lima, MD  Nutritional Supplements (JUICE PLUS FIBRE PO) Take 1 capsule by mouth daily.   Yes Historical Provider, MD  omeprazole (PRILOSEC) 40 MG capsule TAKE 1 CAPSULE BY MOUTH TWICE A DAY 09/09/15  Yes Janith Lima, MD  saccharomyces boulardii (FLORASTOR) 250 MG capsule Take 1 capsule (250 mg total) by mouth 2 (two) times daily. 04/15/16  Yes Lavina Hamman, MD  topiramate (TOPAMAX) 50 MG tablet Take 1 tablet (50 mg total) by mouth 2 (two) times daily. 05/16/15  Yes Melvenia Beam, MD  vancomycin 1,250 mg in sodium chloride 0.9 % 250 mL Inject 1,250 mg into the vein every 8 (eight) hours. Patient taking differently: Inject 1,250 mg into the vein every 12 (twelve) hours.  04/15/16 04/24/16 Yes Lavina Hamman, MD  verapamil (CALAN-SR) 120 MG CR tablet Take 1 tablet by mouth daily. 03/12/16  Yes Historical Provider, MD   BP 116/72 mmHg  Pulse 83  Temp(Src) 98.7 F (37.1 C) (Oral)  Resp 20  Ht 6\' 6"  (1.981 m)  Wt 178.1 kg  BMI 45.38 kg/m2  SpO2 100% Physical Exam  Constitutional: He is oriented to person, place, and time. He appears well-developed and well-nourished. No distress.  HENT:  Head: Normocephalic and atraumatic.  Eyes: Conjunctivae are normal.  Cardiovascular: Normal rate and normal heart sounds.   No murmur heard. Pulmonary/Chest: Effort normal and breath sounds normal.  Abdominal: Soft. There is no tenderness.  Musculoskeletal: He exhibits no edema.  Neurological: He is alert and oriented to person, place, and time.  Skin: Skin is  warm. He is not diaphoretic.  PICC in RUE with 78mm surrounding erythema. Mild ttp.   Psychiatric: He has a normal mood and affect. His behavior is normal.  Nursing note and vitals reviewed.   ED Course  Procedures (including critical care time) Labs Review Labs Reviewed  COMPREHENSIVE METABOLIC PANEL - Abnormal; Notable for the following:    Creatinine, Ser 1.27 (*)    All other components within normal limits  CBC WITH DIFFERENTIAL/PLATELET - Abnormal; Notable for the following:    Monocytes Absolute 1.1 (*)    All other components within normal limits  COMPREHENSIVE METABOLIC PANEL - Abnormal; Notable for the following:    Glucose, Bld 115 (*)    Calcium 8.8 (*)    Albumin 3.3 (*)    All other components within normal limits  MRSA PCR SCREENING  CULTURE, BLOOD (ROUTINE X 2)  CULTURE, BLOOD (ROUTINE X 2)  RESPIRATORY PANEL  BY PCR  URINALYSIS, ROUTINE W REFLEX MICROSCOPIC (NOT AT [facility])  CREATININE, URINE, RANDOM  SODIUM, URINE, RANDOM  MAGNESIUM  PHOSPHORUS  TSH  CBC  I-STAT CG4 LACTIC ACID, ED    Imaging Review Dg Chest 2 View  04/22/2016  CLINICAL DATA:  Fever. EXAM: CHEST  2 VIEW COMPARISON:  Chest radiograph March 28, 2016 FINDINGS: Cardiomediastinal silhouette is normal. RIGHT PICC distal tip projects and mid superior vena cava. The lungs are clear without pleural effusions or focal consolidations. Trachea projects midline and there is no pneumothorax. Soft tissue planes and included osseous structures are non-suspicious. Large body habitus. IMPRESSION: No acute cardiopulmonary process. RIGHT PICC distal tip projects in mid superior vena cava, unchanged. Electronically Signed   By: Elon Alas M.D.   On: 04/22/2016 23:54   I have personally reviewed and evaluated these images and lab results as part of my medical decision-making.   EKG Interpretation None      MDM   Final diagnoses:  None   Labs and cultures drawn in ED. CXR and urine normal. Scheduled  vanc dose given in ED. Patient stable with normal vitals. Admitted to hospitalist for observation.    Allie Bossier, MD 04/23/16 1400  Gareth Morgan, MD 04/27/16 1510

## 2016-04-22 NOTE — ED Notes (Signed)
Patient presents from home stating he thinks his PICC may be infected again.  Patient has been on Vanc for a couple of weeks for a sinus/tooth infection.  Was just in the hospital to have the PICC changed due to being infected.  PICC area is red in areas but do not notice any drainage from the site.  Patient stated he was running a fever at home and did not run a fever the last time he was here to have his PICC changed out.

## 2016-04-22 NOTE — Telephone Encounter (Signed)
Patient called for advice. Pt reports chills, weakness, general feeling of being run down. Patient's temperature this morning was 99, is now 100.0.  Pt reports a yellow crust at the PICC insertion site, states his homehealth RN advised it "didn't look good." Per Dr. Algis Downs advice, patient sent to ED for evaluation.  He was recently hospitalized for PICC line infection.  He verbalized understanding, agreement. Landis Gandy, RN

## 2016-04-22 NOTE — ED Notes (Addendum)
Patient here with feeling fatigued and fever since am. Patient has had ongoing issues with infected picc that has been replaced and here to be evaluated for same. Fever this am and highest 101.7 after lunch. Mild redness to site. Alert and oriented. NAD

## 2016-04-22 NOTE — ED Notes (Signed)
Patient stated he did  Not need the Zofran at this time.

## 2016-04-22 NOTE — ED Notes (Signed)
Patient stated he was feeling good yesterday and went to work to see his co workers.  Today he stated he felt like he could not get up out of the bed just felt bad all over

## 2016-04-23 ENCOUNTER — Encounter (HOSPITAL_COMMUNITY): Payer: Self-pay | Admitting: Internal Medicine

## 2016-04-23 DIAGNOSIS — T39395A Adverse effect of other nonsteroidal anti-inflammatory drugs [NSAID], initial encounter: Secondary | ICD-10-CM | POA: Diagnosis present

## 2016-04-23 DIAGNOSIS — Z6841 Body Mass Index (BMI) 40.0 and over, adult: Secondary | ICD-10-CM | POA: Diagnosis not present

## 2016-04-23 DIAGNOSIS — R7881 Bacteremia: Secondary | ICD-10-CM

## 2016-04-23 DIAGNOSIS — T465X5A Adverse effect of other antihypertensive drugs, initial encounter: Secondary | ICD-10-CM | POA: Diagnosis present

## 2016-04-23 DIAGNOSIS — I10 Essential (primary) hypertension: Secondary | ICD-10-CM | POA: Diagnosis not present

## 2016-04-23 DIAGNOSIS — R509 Fever, unspecified: Secondary | ICD-10-CM | POA: Diagnosis not present

## 2016-04-23 DIAGNOSIS — Z91018 Allergy to other foods: Secondary | ICD-10-CM | POA: Diagnosis not present

## 2016-04-23 DIAGNOSIS — B9562 Methicillin resistant Staphylococcus aureus infection as the cause of diseases classified elsewhere: Secondary | ICD-10-CM | POA: Diagnosis present

## 2016-04-23 DIAGNOSIS — N179 Acute kidney failure, unspecified: Principal | ICD-10-CM

## 2016-04-23 DIAGNOSIS — J4530 Mild persistent asthma, uncomplicated: Secondary | ICD-10-CM | POA: Diagnosis not present

## 2016-04-23 DIAGNOSIS — K219 Gastro-esophageal reflux disease without esophagitis: Secondary | ICD-10-CM | POA: Diagnosis present

## 2016-04-23 DIAGNOSIS — Z79899 Other long term (current) drug therapy: Secondary | ICD-10-CM | POA: Diagnosis not present

## 2016-04-23 DIAGNOSIS — E785 Hyperlipidemia, unspecified: Secondary | ICD-10-CM | POA: Diagnosis present

## 2016-04-23 DIAGNOSIS — R6510 Systemic inflammatory response syndrome (SIRS) of non-infectious origin without acute organ dysfunction: Secondary | ICD-10-CM | POA: Diagnosis present

## 2016-04-23 DIAGNOSIS — K047 Periapical abscess without sinus: Secondary | ICD-10-CM | POA: Diagnosis not present

## 2016-04-23 DIAGNOSIS — Z882 Allergy status to sulfonamides status: Secondary | ICD-10-CM | POA: Diagnosis not present

## 2016-04-23 DIAGNOSIS — Z880 Allergy status to penicillin: Secondary | ICD-10-CM | POA: Diagnosis not present

## 2016-04-23 DIAGNOSIS — T368X5A Adverse effect of other systemic antibiotics, initial encounter: Secondary | ICD-10-CM | POA: Diagnosis present

## 2016-04-23 LAB — RESPIRATORY PANEL BY PCR
Adenovirus: NOT DETECTED
Bordetella pertussis: NOT DETECTED
CORONAVIRUS 229E-RVPPCR: NOT DETECTED
CORONAVIRUS NL63-RVPPCR: NOT DETECTED
CORONAVIRUS OC43-RVPPCR: NOT DETECTED
Chlamydophila pneumoniae: NOT DETECTED
Coronavirus HKU1: NOT DETECTED
INFLUENZA A-RVPPCR: NOT DETECTED
INFLUENZA B-RVPPCR: NOT DETECTED
Influenza A H1 2009: NOT DETECTED
Influenza A H1: NOT DETECTED
Influenza A H3: NOT DETECTED
Metapneumovirus: NOT DETECTED
Mycoplasma pneumoniae: NOT DETECTED
PARAINFLUENZA VIRUS 1-RVPPCR: NOT DETECTED
PARAINFLUENZA VIRUS 3-RVPPCR: NOT DETECTED
PARAINFLUENZA VIRUS 4-RVPPCR: NOT DETECTED
Parainfluenza Virus 2: NOT DETECTED
RHINOVIRUS / ENTEROVIRUS - RVPPCR: NOT DETECTED
Respiratory Syncytial Virus: NOT DETECTED

## 2016-04-23 LAB — URINALYSIS, ROUTINE W REFLEX MICROSCOPIC
Bilirubin Urine: NEGATIVE
Glucose, UA: NEGATIVE mg/dL
Hgb urine dipstick: NEGATIVE
Ketones, ur: NEGATIVE mg/dL
LEUKOCYTES UA: NEGATIVE
NITRITE: NEGATIVE
Protein, ur: NEGATIVE mg/dL
SPECIFIC GRAVITY, URINE: 1.016 (ref 1.005–1.030)
pH: 6.5 (ref 5.0–8.0)

## 2016-04-23 LAB — COMPREHENSIVE METABOLIC PANEL
ALBUMIN: 3.3 g/dL — AB (ref 3.5–5.0)
ALK PHOS: 58 U/L (ref 38–126)
ALT: 34 U/L (ref 17–63)
ANION GAP: 6 (ref 5–15)
AST: 21 U/L (ref 15–41)
BUN: 9 mg/dL (ref 6–20)
CALCIUM: 8.8 mg/dL — AB (ref 8.9–10.3)
CO2: 24 mmol/L (ref 22–32)
Chloride: 108 mmol/L (ref 101–111)
Creatinine, Ser: 1.11 mg/dL (ref 0.61–1.24)
GFR calc Af Amer: >60 mL/min (ref >60)
GFR calc non Af Amer: >60 mL/min (ref >60)
GLUCOSE: 115 mg/dL — AB (ref 65–99)
Potassium: 3.6 mmol/L (ref 3.5–5.1)
SODIUM: 138 mmol/L (ref 135–145)
Total Bilirubin: 1.1 mg/dL (ref 0.3–1.2)
Total Protein: 6.9 g/dL (ref 6.5–8.1)

## 2016-04-23 LAB — CBC
HCT: 40.5 % (ref 39.0–52.0)
Hemoglobin: 13.2 g/dL (ref 13.0–17.0)
MCH: 26.5 pg (ref 26.0–34.0)
MCHC: 32.6 g/dL (ref 30.0–36.0)
MCV: 81.2 fL (ref 78.0–100.0)
PLATELETS: 188 10*3/uL (ref 150–400)
RBC: 4.99 MIL/uL (ref 4.22–5.81)
RDW: 14.3 % (ref 11.5–15.5)
WBC: 6.5 10*3/uL (ref 4.0–10.5)

## 2016-04-23 LAB — CREATININE, URINE, RANDOM: Creatinine, Urine: 253.57 mg/dL

## 2016-04-23 LAB — MRSA PCR SCREENING: MRSA BY PCR: NEGATIVE

## 2016-04-23 LAB — PHOSPHORUS: Phosphorus: 3 mg/dL (ref 2.5–4.6)

## 2016-04-23 LAB — VANCOMYCIN, TROUGH: Vancomycin Tr: 20 ug/mL (ref 15–20)

## 2016-04-23 LAB — SODIUM, URINE, RANDOM: Sodium, Ur: 152 mmol/L

## 2016-04-23 LAB — TSH: TSH: 2.027 u[IU]/mL (ref 0.350–4.500)

## 2016-04-23 LAB — MAGNESIUM: MAGNESIUM: 2 mg/dL (ref 1.7–2.4)

## 2016-04-23 MED ORDER — VERAPAMIL HCL ER 120 MG PO TBCR
120.0000 mg | EXTENDED_RELEASE_TABLET | Freq: Every day | ORAL | Status: DC
  Administered 2016-04-23 – 2016-04-25 (×3): 120 mg via ORAL
  Filled 2016-04-23 (×3): qty 1

## 2016-04-23 MED ORDER — DIPHENHYDRAMINE HCL 25 MG PO CAPS
25.0000 mg | ORAL_CAPSULE | Freq: Two times a day (BID) | ORAL | Status: DC
  Administered 2016-04-23 – 2016-04-25 (×5): 25 mg via ORAL
  Filled 2016-04-23 (×5): qty 1

## 2016-04-23 MED ORDER — ENOXAPARIN SODIUM 80 MG/0.8ML ~~LOC~~ SOLN
80.0000 mg | SUBCUTANEOUS | Status: DC
  Administered 2016-04-23 – 2016-04-24 (×2): 80 mg via SUBCUTANEOUS
  Administered 2016-04-25: 60 mg via SUBCUTANEOUS
  Filled 2016-04-23 (×3): qty 0.8

## 2016-04-23 MED ORDER — BUPROPION HCL 75 MG PO TABS
75.0000 mg | ORAL_TABLET | Freq: Every day | ORAL | Status: DC
  Administered 2016-04-23 – 2016-04-25 (×3): 75 mg via ORAL
  Filled 2016-04-23 (×3): qty 1

## 2016-04-23 MED ORDER — ONDANSETRON HCL 4 MG PO TABS: 4.0000 mg | ORAL_TABLET | Freq: Four times a day (QID) | ORAL | Status: DC | PRN

## 2016-04-23 MED ORDER — DIPHENHYDRAMINE HCL 50 MG/ML IJ SOLN: 25.0000 mg | Freq: Two times a day (BID) | INTRAMUSCULAR | Status: DC

## 2016-04-23 MED ORDER — SACCHAROMYCES BOULARDII 250 MG PO CAPS
250.0000 mg | ORAL_CAPSULE | Freq: Two times a day (BID) | ORAL | Status: DC
  Administered 2016-04-23 – 2016-04-25 (×5): 250 mg via ORAL
  Filled 2016-04-23 (×5): qty 1

## 2016-04-23 MED ORDER — DEXTROSE 5 % IV SOLN
1.0000 g | INTRAVENOUS | Status: DC
  Administered 2016-04-23 – 2016-04-25 (×3): 1 g via INTRAVENOUS
  Filled 2016-04-23 (×3): qty 10

## 2016-04-23 MED ORDER — ALBUTEROL SULFATE (2.5 MG/3ML) 0.083% IN NEBU: 2.5000 mg | INHALATION_SOLUTION | RESPIRATORY_TRACT | Status: DC | PRN

## 2016-04-23 MED ORDER — CLOTRIMAZOLE 10 MG MT TROC
10.0000 mg | Freq: Every day | OROMUCOSAL | Status: DC
  Administered 2016-04-23 – 2016-04-25 (×12): 10 mg via ORAL
  Filled 2016-04-23 (×15): qty 1

## 2016-04-23 MED ORDER — HYDROCODONE-ACETAMINOPHEN 5-325 MG PO TABS: 1.0000 | ORAL_TABLET | ORAL | Status: DC | PRN

## 2016-04-23 MED ORDER — SODIUM CHLORIDE 0.9 % IV SOLN
INTRAVENOUS | Status: DC
  Administered 2016-04-23: 18:00:00 via INTRAVENOUS
  Administered 2016-04-23: 1 mL via INTRAVENOUS
  Administered 2016-04-24 – 2016-04-25 (×3): via INTRAVENOUS

## 2016-04-23 MED ORDER — SODIUM CHLORIDE 0.9 % IV SOLN
INTRAVENOUS | Status: DC
  Administered 2016-04-23: 11:00:00 via INTRAVENOUS

## 2016-04-23 MED ORDER — ACETAMINOPHEN 650 MG RE SUPP: 650.0000 mg | Freq: Four times a day (QID) | RECTAL | Status: DC | PRN

## 2016-04-23 MED ORDER — ACETAMINOPHEN 325 MG PO TABS
650.0000 mg | ORAL_TABLET | Freq: Four times a day (QID) | ORAL | Status: DC | PRN
  Administered 2016-04-24: 650 mg via ORAL
  Filled 2016-04-23: qty 2

## 2016-04-23 MED ORDER — FAMOTIDINE 20 MG PO TABS
20.0000 mg | ORAL_TABLET | Freq: Two times a day (BID) | ORAL | Status: DC
  Administered 2016-04-23 – 2016-04-25 (×5): 20 mg via ORAL
  Filled 2016-04-23 (×5): qty 1

## 2016-04-23 MED ORDER — DIPHENHYDRAMINE HCL 25 MG PO TABS: 25.0000 mg | ORAL_TABLET | Freq: Two times a day (BID) | ORAL | Status: DC

## 2016-04-23 MED ORDER — SODIUM CHLORIDE 0.9 % IV SOLN
2250.0000 mg | Freq: Two times a day (BID) | INTRAVENOUS | Status: DC
  Administered 2016-04-23 – 2016-04-25 (×5): 2250 mg via INTRAVENOUS
  Filled 2016-04-23 (×6): qty 2250

## 2016-04-23 MED ORDER — ONDANSETRON HCL 4 MG/2ML IJ SOLN: 4.0000 mg | Freq: Four times a day (QID) | INTRAMUSCULAR | Status: DC | PRN

## 2016-04-23 MED ORDER — ALPRAZOLAM 0.5 MG PO TABS: 1.0000 mg | ORAL_TABLET | Freq: Two times a day (BID) | ORAL | Status: DC | PRN

## 2016-04-23 MED ORDER — TOPIRAMATE 25 MG PO TABS
50.0000 mg | ORAL_TABLET | Freq: Two times a day (BID) | ORAL | Status: DC
Start: 2016-04-23 — End: 2016-04-25
  Administered 2016-04-23 – 2016-04-25 (×5): 50 mg via ORAL
  Filled 2016-04-23 (×6): qty 2

## 2016-04-23 NOTE — Progress Notes (Signed)
Patient seen and examined. Admitted after midnight secondary to fever and was found to have mild AKI. Patient reported some erythema at the insertion area of his PICC line. He reported Alexis [name] at home fluctuating up and down (reaching supratherapeutic levels initially and then below therapeutic levels for the last 2 trough. No CP, no nausea, no vomiting, no abd pain, no HA's, no dysuria. Patient is hemodynamically stable. Please refer to H&P written by Dr. Roel Cluck for further info/details on admission.  Plan: -will d/c PICC line -will provide IVF's -follow blood cx's -complete anticipated vancomycin therapy; pharmacy dosing. -follow renal function trend   Alexis [name] [id number]

## 2016-04-23 NOTE — ED Notes (Signed)
Unable to give report at this time.

## 2016-04-23 NOTE — Progress Notes (Addendum)
Pharmacy Antibiotic Note  Alexis [name] is a 30 y.o. male admitted on 04/22/2016 with fever. Pt continues on Vancomycin (Day #13; planned end date 7/15) for MRSA bacteremia from PICC line. 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 Also being pre-treated with benadryl. Current home dose 2250mg  IV q12h (this was increased from 1750mg  IV q12h which resulted in Vanc trough of 10 mcg/ml). Per pt he has received only 1 dose of the 2250mg  with last home dose was 7/12 0830. Pt received 2250mg  IV in ED ~2250.  Noted that SCr is up a bit to 1.27 - will watch.  Plan: Continue Vancomycin 2250mg  IV q12h (each dose over 3 hours with r/o Redman's syndrome) Benadryl 25mg  po 30 min prior to Vancomycin dose Consider trough with pm dose 7/13 Will f/u renal function, micro date, pt's clinical condition    Temp (24hrs), Avg:99.5 F (37.5 C), Min:98.7 F (37.1 C), Max:100.7 F (38.2 C)   Recent Labs Lab 04/16/16 0805 04/19/16 0830 04/22/16 1829 04/22/16 1851  WBC  --  8.0 8.1  --   CREATININE  --  1.07 1.27*  --   LATICACIDVEN  --   --   --  1.33  VANCOTROUGH 7* 10*  --   --     Estimated Creatinine Clearance: 154.3 mL/min (by C-G formula based on Cr of 1.27).    Allergies  Allergen Reactions  . Citrus Anaphylaxis and Other (See Comments)    Lemon  . Amoxicillin-Pot Clavulanate Nausea Only  . Sulfamethoxazole-Trimethoprim Other (See Comments)    Does not remember, long ago  . Vancomycin Other (See Comments)    Mild red mans syndrome, infuse more slowly.    Antimicrobials this admission: Ertapenem PTA 6/17 >> 7/7  Vanc 7/1 >> (planned 7/15)  Dose adjustments this admission: None  Microbiology results: Outside BCx >> 2/2 MRSA (drawn via PICC) 7/1 BCx >> neg 7/3 BCx x 1 >> neg 7/12 BCx x2 >>  Thank you for allowing pharmacy to be a part of this patient's care.  Sherlon Handing, PharmD, BCPS Clinical  pharmacist, pager [phone] 04/23/2016 1:52 AM

## 2016-04-23 NOTE — H&P (Signed)
Alexis [name] DOB: [birth year] DOA: 04/22/2016     PCP: Scarlette Calico, MD   Outpatient Specialists: [facility], [facility] [place] Patient coming from:   home Lives    With family    Chief Complaint: fever  HPI: Alexis [name] is a 30 y.o. male with medical history significant of dental abscess   Had to be treated with IV Invanz for 3 weeks developed PICC line infection resulting in MRSA bacteremia recently has been on vancomycin IV  Presented with fever today up to 101.7. He woke up feeling very fatigued. He is feeling better as long as he is laying down some nausea. NO vomiting diarrhea, no abdominal pain, no cough.  He noted his PICC line had some crusty discharge and  more pink today with some pain. Somewhat similar to prior presentation. He has been concerned discussed patient's vancomycin level has been fluctuating. 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 During his prior hospitalization patient undergone echogram and he is not evidence of vegetation knee PICC line was placed on July 5  IN ER: MAXIMUM TEMPERATURE 100.7, heart rate recorded is 155 but thought to be erroneous recording blood pressure 138/89 WBC 8.1 hemoglobin 14.2 platelets 228 creatinine 1.27 she is up from baseline of 1.07 lactic acid 1.3 Blood cultures were obtained Chest x-ray unremarkable UA unremarkable    Hospitalist was called for admission for SIRS  Review of Systems:    Pertinent positives include:  Fevers, chills, fatigue,  Constitutional:  No weight loss, night sweats, weight loss  HEENT:  No headaches, Difficulty swallowing,Tooth/dental problems,Sore throat,   No sneezing, itching, ear ache, nasal congestion, post nasal drip,  Cardio-vascular:  No chest pain, Orthopnea, PND, anasarca, dizziness, palpitations.no Bilateral lower extremity swelling  GI:  No heartburn, indigestion, abdominal pain, nausea, vomiting, diarrhea, change in bowel habits, loss of appetite, melena, blood in stool, hematemesis Resp:  no shortness of breath at rest. No dyspnea on exertion, No excess mucus, no productive cough, No non-productive cough, No coughing up of blood.No change in color of mucus.No wheezing. Skin:  no rash or lesions. No jaundice GU:  no dysuria, change in color of urine, no urgency or frequency. No straining to urinate.  No flank pain.  Musculoskeletal:  No joint pain or no joint swelling. No decreased range of motion. No back pain.  Psych:  No change in mood or affect. No depression or anxiety. No memory loss.  Neuro: no localizing neurological complaints, no tingling, no weakness, no double vision, no gait abnormality, no slurred speech, no confusion  As per HPI otherwise 10 point review of systems negative.   Past Medical History: Past Medical History  Diagnosis Date  . ALLERGIC RHINITIS 06/10/2009  . ADD 09/13/2007  . ASTHMA, UNSPECIFIED, UNSPECIFIED STATUS 07/19/2008  . DEPRESSIVE DISORDER 09/13/2007  . DYSMETABOLIC SYNDROME 0000000  . GERD 09/13/2007  . HYPERLIPIDEMIA 10/10/2009  . HYPERTENSION 09/13/2007  . IBS 03/20/2009  . Morbid obesity ([place]) 07/19/2008  . Skin cancer   . Acid reflux    Past Surgical History  Procedure Laterality Date  . Appendectomy  2005  . Ankle surgery  2007  . Skin cancer excision      teenager  . Tee without cardioversion N/A 04/15/2016    Procedure: TRANSESOPHAGEAL ECHOCARDIOGRAM (TEE);  Surgeon: Sanda Klein, MD;  Location: [facility] ENDOSCOPY;  Service: Cardiovascular;  Laterality: N/A;     Social History:  Ambulatory  Independently     reports that he has never smoked. He has never used smokeless  tobacco. He reports that he does not drink alcohol or use illicit drugs.  Allergies:   Allergies  Allergen Reactions  . Citrus Anaphylaxis and Other (See Comments)    Lemon  . Amoxicillin-Pot Clavulanate Nausea Only  . Sulfamethoxazole-Trimethoprim Other (See Comments)    Does not remember, long ago  . Vancomycin Other (See Comments)    Mild red mans syndrome, infuse more slowly.       Family History:    Family History  Problem Relation Age of Onset  . Hypertension Father   . Diabetes Father     type 2   . Emphysema Maternal Grandmother     smoker  . Asthma Maternal Grandmother   . Lung cancer Maternal Grandmother   . Allergies      whole family-both sides of family  . Asthma Mother   . Clotting disorder Mother     Factor 5  . Rheum arthritis Mother   . Skin cancer Mother   . Asthma Paternal Grandmother   . Diabetes Paternal Grandmother     type 2  . Heart disease      MGM deceased with MI; both sides of grandparents  . Clotting disorder Maternal Uncle     Factor 5    Medications: Prior to Admission medications   Medication Sig Start Date End Date Taking? Authorizing Provider  ALPRAZolam Duanne Moron) 1 MG tablet Take 1 mg by mouth 2 (two) times daily as needed for anxiety.   Yes Historical Provider, MD  buPROPion (WELLBUTRIN) 75 MG tablet Take 1 tablet (75 mg total) by mouth daily. 09/09/15  Yes Janith Lima, MD  celecoxib (CELEBREX) 200 MG capsule TAKE ONE CAPSULE BY MOUTH EVERY DAY Patient taking differently: Take 200 mg by mouth daily as needed for moderate pain.  09/09/15  Yes Janith Lima, MD  clotrimazole (MYCELEX) 10 MG troche Take 1 tablet (10 mg total) by mouth 5 (five) times daily. 04/15/16 04/24/16 Yes Lavina Hamman, MD  diphenhydrAMINE (BENADRYL) 25 MG tablet Take 25 mg by mouth 2 (two) times daily.   Yes Historical Provider, MD  EPINEPHrine (EPI-PEN) 0.3 mg/0.3 mL DEVI Inject 0.3 mLs (0.3 mg total) into the muscle once. 02/23/13  Yes Deneise Lever, MD    famotidine (PEPCID) 20 MG tablet Take 20 mg by mouth 2 (two) times daily. 04/07/16  Yes Historical Provider, MD  losartan (COZAAR) 100 MG tablet Take 1 tablet (100 mg total) by mouth daily. 09/09/15  Yes Janith Lima, MD  Nutritional Supplements (JUICE PLUS FIBRE PO) Take 1 capsule by mouth daily.   Yes Historical Provider, MD  omeprazole (PRILOSEC) 40 MG capsule TAKE 1 CAPSULE BY MOUTH TWICE A DAY 09/09/15  Yes Janith Lima, MD  saccharomyces boulardii (FLORASTOR) 250 MG capsule Take 1 capsule (250 mg total) by mouth 2 (two) times daily. 04/15/16  Yes Lavina Hamman, MD  topiramate (TOPAMAX) 50 MG tablet Take 1 tablet (50 mg total) by mouth 2 (two) times daily. 05/16/15  Yes Melvenia Beam, MD  vancomycin 1,250 mg in sodium chloride 0.9 % 250 mL Inject  1,250 mg into the vein every 8 (eight) hours. Patient taking differently: Inject 1,250 mg into the vein every 12 (twelve) hours.  04/15/16 04/24/16 Yes Lavina Hamman, MD  verapamil (CALAN-SR) 120 MG CR tablet Take 1 tablet by mouth daily. 03/12/16  Yes Historical Provider, MD    Physical Exam: Patient Vitals for the past 24 hrs:  BP Temp Temp src Pulse Resp SpO2  04/22/16 2300 119/88 mmHg - - 97 23 97 %  04/22/16 2245 122/93 mmHg - - 105 22 97 %  04/22/16 2230 138/88 mmHg - - 93 - 98 %  04/22/16 2145 151/98 mmHg - - 91 - 100 %  04/22/16 2130 150/99 mmHg - - 96 - 98 %  04/22/16 2103 (!) 151/101 mmHg 98.8 F (37.1 C) Oral 104 - -  04/22/16 2100 (!) 151/101 mmHg - - 100 - 99 %  04/22/16 2030 131/89 mmHg - - (!) 155 24 94 %  04/22/16 2004 (!) 156/105 mmHg 99.8 F (37.7 C) Oral 113 20 99 %  04/22/16 1811 141/94 mmHg 100.7 F (38.2 C) Oral (!) 131 18 99 %    1. General:  in No Acute distress 2. Psychological: Alert and  Oriented 3. Head/ENT:     Dry Mucous Membranes                          Head Non traumatic, neck supple                          Normal   Dentition 4. SKIN: decreased Skin turgor,  Skin clean Dry and intact Slight redness  around PICC line insertion site 5. Heart: Regular rate and rhythm no  Murmur, Rub or gallop 6. Lungs:  Clear to auscultation bilaterally, no wheezes or crackles   7. Abdomen: Soft, non-tender, Non distended, obese 8. Lower extremities: no clubbing, cyanosis, or edema 9. Neurologically Grossly intact, moving all 4 extremities equally 10. MSK: Normal range of motion   body mass index is unknown because there is no weight on file.  Labs on Admission:   Labs on Admission: I have personally reviewed following labs and imaging studies  CBC:  Recent Labs Lab 04/19/16 0830 04/22/16 1829  WBC 8.0 8.1  NEUTROABS  --  5.1  HGB 13.5 14.2  HCT 41.4 44.1  MCV 82.0 81.4  PLT 230 [id number]   Basic Metabolic Panel:  Recent Labs Lab 04/19/16 0830 04/22/16 1829  NA 136 137  K 4.0 3.6  CL 108 106  CO2 23 22  GLUCOSE 112* 92  BUN 13 6  CREATININE 1.07 1.27*  CALCIUM 8.4* 8.9   GFR: Estimated Creatinine Clearance: 154.3 mL/min (by C-G formula based on Cr of 1.27). Liver Function Tests:  Recent Labs Lab 04/22/16 1829  AST 24  ALT 40  ALKPHOS 67  BILITOT 1.1  PROT 7.1  ALBUMIN 3.5   No results for input(s): LIPASE, AMYLASE in the last 168 hours. No results for input(s): AMMONIA in the last 168 hours. Coagulation Profile: No results for input(s): INR, PROTIME in the last 168 hours. Cardiac Enzymes: No results for input(s): CKTOTAL, CKMB, CKMBINDEX, TROPONINI in the last 168 hours. BNP (last 3 results) No results for input(s): PROBNP in the last 8760 hours. HbA1C: No results for input(s): HGBA1C in the last 72 hours. CBG: No results for input(s): GLUCAP in the last 168 hours. Lipid Profile: No results for input(s): CHOL,  HDL, LDLCALC, TRIG, CHOLHDL, LDLDIRECT in the last 72 hours. Thyroid Function Tests: No results for input(s): TSH, T4TOTAL, FREET4, T3FREE, THYROIDAB in the last 72 hours. Anemia Panel: No results for input(s): VITAMINB12, FOLATE, FERRITIN, TIBC, IRON,  RETICCTPCT in the last 72 hours. Urine analysis:    Component Value Date/Time   COLORURINE YELLOW 12/02/2009 2207   APPEARANCEUR CLEAR 12/02/2009 2207   LABSPEC 1.023 12/02/2009 2207   PHURINE 5.5 12/02/2009 2207   GLUCOSEU NEGATIVE 12/02/2009 2207   HGBUR NEGATIVE 12/02/2009 2207   BILIRUBINUR negative 12/01/2012 1432   BILIRUBINUR NEGATIVE 12/02/2009 2207   KETONESUR NEGATIVE 12/02/2009 2207   PROTEINUR negative 12/01/2012 1432   PROTEINUR NEGATIVE 12/02/2009 2207   UROBILINOGEN 0.2 12/01/2012 1432   UROBILINOGEN 0.2 12/02/2009 2207   NITRITE negative 12/01/2012 1432   NITRITE NEGATIVE 12/02/2009 2207   LEUKOCYTESUR Negative 12/01/2012 1432   Sepsis Labs: @LABRCNTIP (procalcitonin:4,lacticidven:4) ) Recent Results (from the past 240 hour(s))  Culture, blood (single)     Status: None   Collection Time: 04/13/16  7:45 AM  Result Value Ref Range Status   Specimen Description BLOOD LEFT HAND  Final   Special Requests IN PEDIATRIC BOTTLE 3CC  Final   Culture NO GROWTH 5 DAYS  Final   Report Status 04/18/2016 FINAL  Final     UA   no evidence of UTI  Lab Results  Component Value Date   HGBA1C 5.5 03/08/2007    Estimated Creatinine Clearance: 154.3 mL/min (by C-G formula based on Cr of 1.27).  BNP (last 3 results) No results for input(s): PROBNP in the last 8760 hours.   ECG REPORT Not obtained  There were no vitals filed for this visit.   Cultures:    Component Value Date/Time   SDES BLOOD LEFT HAND 04/13/2016 0745   SPECREQUEST IN PEDIATRIC BOTTLE 3CC 04/13/2016 0745   CULT NO GROWTH 5 DAYS 04/13/2016 0745   REPTSTATUS 04/18/2016 FINAL 04/13/2016 0745     Radiological Exams on Admission: Dg Chest 2 View  04/22/2016  CLINICAL DATA:  Fever. EXAM: CHEST  2 VIEW COMPARISON:  Chest radiograph March 28, 2016 FINDINGS: Cardiomediastinal silhouette is normal. RIGHT PICC distal tip projects and mid superior vena cava. The lungs are clear without pleural effusions or  focal consolidations. Trachea projects midline and there is no pneumothorax. Soft tissue planes and included osseous structures are non-suspicious. Large body habitus. IMPRESSION: No acute cardiopulmonary process. RIGHT PICC distal tip projects in mid superior vena cava, unchanged. Electronically Signed   By: Elon Alas M.D.   On: 04/22/2016 23:54    Chart has been reviewed    Assessment/Plan  30 y.o. male with medical history significant of dental abscess   Had to be treated with IV Invanz for 3 weeks developed PICC line infection resulting in MRSA bacteremia recently has been on vancomycin IV here with fever and acute kidney injury  Present on Admission:  . Fever - unclear etiology. Will obtain blood cultures. If no evidence of possible PICC line site infection with persistent MRSA bacteremia would consider discontinuing PICC. Will benefit from ID consult in the morning. Will obtain respiratory panel to evaluate for possible viral infection. At this point source unclear. Asian have had extensive workup during prior admission with echogram and TEE. Would add gram-negative coverage for tonight. . Essential hypertension stable continue home medications hold losartan  . MRSA bacteremia continue vancomycin per pharmacy  . SIRS (systemic inflammatory response syndrome) (HCC) . Mild persistent asthma stable albuterol as needed  acute kidney injury likely secondary to decreased by mouth intake and fever will rehydrate what her urine electrolytes hold renal toxic medications short of pharmacy regarding vancomycin level testing  . Acute kidney injury ([place]) we'll rehydrate and follow creatinine  Other plan as per orders.  DVT prophylaxis:    Lovenox     Code Status:  FULL CODE   as per patient   Family Communication:   Family at  Bedside  plan of care was discussed with  Mother Alexis [name] [phone]  Disposition Plan:    To home once workup is complete and patient is stable   Consults  called: none   Admission status:   obs    Level of care   medical floor      I have spent a total of 56 min on this admission    Alexis [name] 04/23/2016, 1:34 AM    [facility]  Pager [phone]   after 2 AM please page floor coverage PA If 7AM-7PM, please contact the day team taking care of the patient  [email or web address]  Password [email or web address]

## 2016-04-23 NOTE — Care Management Note (Signed)
Case Management Note  Patient Details  Name: Alexis [name] MRN: [id number] Date of Birth: 11-28-1985  Subjective/Objective:                    Action/Plan:  Patient was active with [facility] prior to admission. Will continue to follow. Expected Discharge Date:  04/24/16               Expected Discharge Plan:  [place]  In-House Referral:     Discharge planning Services     Post Acute Care Choice:    Choice offered to:     DME Arranged:    DME Agency:     HH Arranged:    [place] Agency:     Status of Service:  In process, will continue to follow  If discussed at Long Length of Stay Meetings, dates discussed:    Additional Comments:  Marilu Favre, RN 04/23/2016, 10:06 AM

## 2016-04-23 NOTE — Progress Notes (Signed)
Pharmacy Antibiotic Note  Alexis [name] is a 30 y.o. male admitted on 04/22/2016 with fever. Pt continues on Vancomycin (Day #13; planned end date 7/15) for MRSA bacteremia from PICC line. 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 Also being pre-treated with benadryl. Current home dose 2250mg  IV q12h (this was increased from 1750mg  IV q12h which resulted in Vanc trough of 10 mcg/ml). SCr appears stable.  Vancomycin trough 20 mcg/ml (THERAPEUTIC for goal trough 15-20 mcg/ml)  Plan: Continue Vancomycin 2250mg  IV q12h (each dose over 3 hours with r/o Redman's syndrome) Benadryl 25mg  po 30 min prior to Vancomycin dose Will f/u renal function, micro date, pt's clinical condition SCr q72h while on Vancomycin, weekly trough  Height: 6\' 6"  (198.1 cm) Weight: (!) 392 lb 10.2 oz (178.1 kg) IBW/kg (Calculated) : 91.4  Temp (24hrs), Avg:99.2 F (37.3 C), Min:98.7 F (37.1 C), Max:100.2 F (37.9 C)   Recent Labs Lab 04/19/16 0830 04/22/16 1829 04/22/16 1851 04/23/16 0955 04/23/16 2114  WBC 8.0 8.1  --  6.5  --   CREATININE 1.07 1.27*  --  1.11  --   LATICACIDVEN  --   --  1.33  --   --   VANCOTROUGH 10*  --   --   --  20    Estimated Creatinine Clearance: 173.6 mL/min (by C-G formula based on Cr of 1.11).    Allergies  Allergen Reactions  . Citrus Anaphylaxis and Other (See Comments)    Lemon  . Amoxicillin-Pot Clavulanate Nausea Only  . Sulfamethoxazole-Trimethoprim Other (See Comments)    Does not remember, long ago  . Vancomycin Other (See Comments)    Mild red mans syndrome, infuse more slowly.    Antimicrobials this admission: Ertapenem PTA 6/17 >> 7/7  Vanc 7/1 >> (planned 7/15)  Dose adjustments this admission: None  Microbiology results: Outside BCx >> 2/2 MRSA (drawn via PICC) 7/1 BCx >> neg 7/3 BCx x 1 >> neg 7/12 BCx x2 >> ngtd 7/13 RSV panel >> neg 7/13 MRSA PCR>>  neg  Thank you for allowing pharmacy to be a part of this patient's care.  Sherlon Handing, PharmD, BCPS Clinical pharmacist, pager [phone] 04/23/2016 11:33 PM

## 2016-04-24 LAB — BASIC METABOLIC PANEL
ANION GAP: 9 (ref 5–15)
BUN: 9 mg/dL (ref 6–20)
CHLORIDE: 107 mmol/L (ref 101–111)
CO2: 22 mmol/L (ref 22–32)
Calcium: 8.6 mg/dL — ABNORMAL LOW (ref 8.9–10.3)
Creatinine, Ser: 1.08 mg/dL (ref 0.61–1.24)
GFR calc Af Amer: >60 mL/min (ref >60)
GFR calc non Af Amer: >60 mL/min (ref >60)
GLUCOSE: 80 mg/dL (ref 65–99)
POTASSIUM: 3.6 mmol/L (ref 3.5–5.1)
Sodium: 138 mmol/L (ref 135–145)

## 2016-04-24 MED ORDER — LORATADINE 10 MG PO TABS: 10.0000 mg | ORAL_TABLET | Freq: Every day | ORAL | Status: DC | PRN

## 2016-04-24 NOTE — Progress Notes (Signed)
[facility] PROGRESS NOTE  Alexis [name] [id number] DOB: 01/27/1986 DOA: 04/22/2016 PCP: Scarlette Calico, MD  Interim summary and history of present illness 30 year old male with medical history significant for dental abscess (that required IV Invanz for 3 weeks), hypertension, allergic rhinitis and obesity. Who presented to the emergency department secondary to generalized malaise, right upper extremity redness where his PICC line was placed and fever. Patient has a recent admission secondary to PICC line infection with MRSA bacteremia. During that admission workup demonstrated no vegetations and the plan was for him to be treated with 2 weeks of vancomycin; he has a new PICC line placed on 04/15/2016. Return to the emergency department with erythematous changes at the site of insertion of his new PICC line and fever. Patient was also found to have acute kidney injury.  Assessment/Plan: 1-fever: Unclear source at this moment. -Patient responded well to PICC line removal, and the use of vancomycin/Rocephin. -Case has been discussed with infectious disease service, who recommended to complete antibiotics therapy as previously scheduled and to follow new set of blood cultures; if there is no growth for further signs of infection he will not require any further workup or antibiotics to go home with. -He has a follow-up appointment scheduled for 7/21 with Dr. Linus Salmons -No source of infection has been appreciated during this admission  2-acute kidney injury: Appears to be multifactorial: in the setting of supratherapeutic vancomycin level in 2 locations as an outpatient, decrease fluid intake and continue use of nephrotoxic agent (losartan) -Patient's kidney function is now back to normal after IV fluid resuscitation. -Will continue holding nephrotoxic agents -IV fluids rate will be changed to [facility]. -Will follow renal function and electrolytes intermittently.  3-asthma: Mild persistent -Currently  stable and well controlled -There is is no wheezing, no shortness of breath -Continue when necessary albuterol   4-essential hypertension : Will continue holding losartan for now  -Hypertension is a stable at this moment -will monitor VS -continue heart healthy diet  5-obesity -Body mass index is 45.38 kg/(m^2). -low calorie diet and increase exercise activity discussed with patient.  6-GERD -continue famotidine  Code Status: Full code Family Communication: Mother at bedside Disposition Plan: Home when medically stable; most likely in the next 24-48 hours.   Consultants:  ID has been curbside (Dr. Megan Salon)  Procedures:  See below for x-ray reports  Antibiotics:  Vancomycin  Rocephin  HPI/Subjective: Currently afebrile, feeling better overall. No chest pain, no shortness of breath, no nausea or vomiting. Patient reports mild headache and also some rhinitis congestion  Objective: Filed Vitals:   04/24/16 0511 04/24/16 1414  BP: 126/92 127/78  Pulse: 83 78  Temp: 98.9 F (37.2 C) 98.8 F (37.1 C)  Resp: 19 18    Intake/Output Summary (Last 24 hours) at 04/24/16 1825 Last data filed at 04/24/16 1752  Gross per 24 hour  Intake   4510 ml  Output      4 ml  Net   4506 ml   Filed Weights   04/23/16 0201  Weight: 178.1 kg (392 lb 10.2 oz)    Exam:   General:  Afebrile, no chest pain, no shortness of breath. Patient reports some mild headache  Cardiovascular: S1 and S2, no rubs, no gallops, no murmurs  Respiratory: Good air movement bilaterally, no wheezing/crackles, no use of accessory muscles; good oxygen saturation on room air  Abdomen: Soft, obese, nontender, nondistended, positive bowel sounds  Musculoskeletal:  No edema or cyanosis in his lower extremities; right  upper extremity with very mild erythematous changes where PICC line was placed.  Data Reviewed: Basic Metabolic Panel:  Recent Labs Lab 04/19/16 0830 04/22/16 1829 04/23/16 0955  04/24/16 0443  NA 136 137 138 138  K 4.0 3.6 3.6 3.6  CL 108 106 108 107  CO2 23 22 24 22   GLUCOSE 112* 92 115* 80  BUN 13 6 9 9   CREATININE 1.07 1.27* 1.11 1.08  CALCIUM 8.4* 8.9 8.8* 8.6*  MG  --   --  2.0  --   PHOS  --   --  3.0  --    Liver Function Tests:  Recent Labs Lab 04/22/16 1829 04/23/16 0955  AST 24 21  ALT 40 34  ALKPHOS 67 58  BILITOT 1.1 1.1  PROT 7.1 6.9  ALBUMIN 3.5 3.3*   CBC:  Recent Labs Lab 04/19/16 0830 04/22/16 1829 04/23/16 0955  WBC 8.0 8.1 6.5  NEUTROABS  --  5.1  --   HGB 13.5 14.2 13.2  HCT 41.4 44.1 40.5  MCV 82.0 81.4 81.2  PLT 230 228 188   CBG: No results for input(s): GLUCAP in the last 168 hours.  Recent Results (from the past 240 hour(s))  Blood culture (routine x 2)     Status: None (Preliminary result)   Collection Time: 04/22/16 10:05 PM  Result Value Ref Range Status   Specimen Description BLOOD LEFT ARM  Final   Special Requests BOTTLES DRAWN AEROBIC AND ANAEROBIC 5CC  Final   Culture NO GROWTH 2 DAYS  Final   Report Status PENDING  Incomplete  Blood culture (routine x 2)     Status: None (Preliminary result)   Collection Time: 04/22/16 10:17 PM  Result Value Ref Range Status   Specimen Description BLOOD LEFT HAND  Final   Special Requests BOTTLES DRAWN AEROBIC AND ANAEROBIC 5CC  Final   Culture NO GROWTH 2 DAYS  Final   Report Status PENDING  Incomplete  MRSA PCR Screening     Status: None   Collection Time: 04/23/16 11:01 AM  Result Value Ref Range Status   MRSA by PCR NEGATIVE NEGATIVE Final    Comment:        The GeneXpert MRSA Assay (FDA approved for NASAL specimens only), is one component of a comprehensive MRSA colonization surveillance program. It is not intended to diagnose MRSA infection nor to guide or monitor treatment for MRSA infections.   Respiratory Panel by PCR     Status: None   Collection Time: 04/23/16 11:25 AM  Result Value Ref Range Status   Adenovirus NOT DETECTED NOT DETECTED  Final   Coronavirus 229E NOT DETECTED NOT DETECTED Final   Coronavirus HKU1 NOT DETECTED NOT DETECTED Final   Coronavirus NL63 NOT DETECTED NOT DETECTED Final   Coronavirus OC43 NOT DETECTED NOT DETECTED Final   Metapneumovirus NOT DETECTED NOT DETECTED Final   Rhinovirus / Enterovirus NOT DETECTED NOT DETECTED Final   Influenza A NOT DETECTED NOT DETECTED Final   Influenza A H1 NOT DETECTED NOT DETECTED Final   Influenza A H1 2009 NOT DETECTED NOT DETECTED Final   Influenza A H3 NOT DETECTED NOT DETECTED Final   Influenza B NOT DETECTED NOT DETECTED Final   Parainfluenza Virus 1 NOT DETECTED NOT DETECTED Final   Parainfluenza Virus 2 NOT DETECTED NOT DETECTED Final   Parainfluenza Virus 3 NOT DETECTED NOT DETECTED Final   Parainfluenza Virus 4 NOT DETECTED NOT DETECTED Final   Respiratory Syncytial Virus NOT DETECTED NOT  DETECTED Final   Bordetella pertussis NOT DETECTED NOT DETECTED Final   Chlamydophila pneumoniae NOT DETECTED NOT DETECTED Final   Mycoplasma pneumoniae NOT DETECTED NOT DETECTED Final     Studies: Dg Chest 2 View  04/22/2016  CLINICAL DATA:  Fever. EXAM: CHEST  2 VIEW COMPARISON:  Chest radiograph March 28, 2016 FINDINGS: Cardiomediastinal silhouette is normal. RIGHT PICC distal tip projects and mid superior vena cava. The lungs are clear without pleural effusions or focal consolidations. Trachea projects midline and there is no pneumothorax. Soft tissue planes and included osseous structures are non-suspicious. Large body habitus. IMPRESSION: No acute cardiopulmonary process. RIGHT PICC distal tip projects in mid superior vena cava, unchanged. Electronically Signed   By: Elon Alas M.D.   On: 04/22/2016 23:54    Scheduled Meds: . buPROPion  75 mg Oral Daily  . cefTRIAXone (ROCEPHIN) IVPB 1 gram/50 mL D5W  1 g Intravenous Q24H  . clotrimazole  10 mg Oral 5 X Daily  . diphenhydrAMINE  25 mg Oral Q12H  . enoxaparin (LOVENOX) injection  80 mg Subcutaneous Q24H   . famotidine  20 mg Oral BID  . ondansetron (ZOFRAN) IV  4 mg Intravenous Once  . saccharomyces boulardii  250 mg Oral BID  . topiramate  50 mg Oral BID  . vancomycin  2,250 mg Intravenous Q12H  . verapamil  120 mg Oral Daily   Continuous Infusions: . sodium chloride 75 mL/hr at 04/24/16 0950    Active Problems:   Essential hypertension   Mild persistent asthma   MRSA bacteremia   SIRS (systemic inflammatory response syndrome) (HCC)   Fever   Acute kidney injury ([place])    Time spent: 35 minutes   Barton Dubois  [facility] Pager [phone]. If 7PM-7AM, please contact night-coverage at [email or web address], password [facility] 04/24/2016, 6:25 PM  LOS: 1 day

## 2016-04-25 DIAGNOSIS — R509 Fever, unspecified: Secondary | ICD-10-CM | POA: Insufficient documentation

## 2016-04-25 NOTE — Progress Notes (Signed)
[facility]  Patient Status: Active pt with [facility] prior to this admission  [facility] is providing the following services: HHRN and Home Infusion Pharmacy team for home IV ABX (IV  Vancomcyin 2250 Q 12 hours).  [facility] hospital team will follow [name] while he is an inpatient to support his transition home when ordered.    If patient discharges after hours, please call [phone].   Alexis [name] 04/25/2016, 8:24 AM

## 2016-04-25 NOTE — Discharge Summary (Signed)
Physician Discharge Summary  Alexis [name] DOB: 1986-05-26 DOA: 04/22/2016  PCP: Scarlette Calico, MD  Admit date: 04/22/2016 Discharge date: 04/25/2016  Time spent: 35 minutes  Recommendations for Outpatient Follow-up:  Repeat BMET to follow electrolytes and renal function   Discharge Diagnoses:  Active Problems:   Essential hypertension   Mild persistent asthma   MRSA bacteremia   SIRS (systemic inflammatory response syndrome) (HCC)   Fever   Acute kidney injury ([place])   Discharge Condition: stable and improved. Patient afebrile for more than 48 hours at discharge; will follow up with ID service as an outpatient.  Diet recommendation: heart healthy diet   Filed Weights   04/23/16 0201  Weight: 178.1 kg (392 lb 10.2 oz)    History of present illness:  30 year old male with medical history significant for dental abscess (that required IV Invanz for 3 weeks), hypertension, allergic rhinitis and obesity. Who presented to the emergency department secondary to generalized malaise, right upper extremity redness where his PICC line was placed and fever. Patient has a recent admission secondary to PICC line infection with MRSA bacteremia. During that admission workup demonstrated no vegetations and the plan was for him to be treated with 2 weeks of vancomycin; he has a new PICC line placed on 04/15/2016. Return to the emergency department with erythematous changes at the site of insertion of his new PICC line and fever. Patient was also found to have acute kidney injury.  Hospital Course:  1-fever: Unclear source at this moment. -Patient responded well to PICC line removal, and the use of vancomycin/Rocephin. -Case has been discussed with infectious disease service, who recommended to complete antibiotics therapy as previously scheduled (until 7/15) and to follow new set of blood cultures (which remained neg for 72 hours); if there is no growth or further signs of infection he will  not require any further workup or antibiotics therapy -He has a follow-up appointment scheduled for 7/21 with Dr. Linus Salmons -No source of infection has been appreciated during this admission  2-acute kidney injury: Appears to be multifactorial: in the setting of supratherapeutic vancomycin level in 2 ocations as an outpatient, decrease fluid intake and continue use of nephrotoxic agent (losartan and celebrex) -Patient's kidney function is now back to normal after IV fluid resuscitation. -patient advise to keep himself well hydrated -recommend follow up of his renal function and electrolytes at follow up visit  -ok to resume losartan and celebrex at this moment   3-asthma: Mild persistent -Currently stable and well controlled -There is is no wheezing, no shortness of breath -Continue PRN albuterol   4-essential hypertension :  -stable overall -will resume home antihypertensive regimen  Advise to follow heart healthy diet   5-obesity -Body mass index is 45.38 kg/(m^2). -low calorie diet and increase exercise activity discussed with patient.  6-GERD -continue famotidine  7-migraines: -continue topamax   8-sinusitis  -recommended to follow up with ENT for further definitive treatment   Procedures:  See below for x-ray reports   Consultations:  ID has been curbside (Dr. Megan Salon)  Discharge Exam: Filed Vitals:   04/25/16 0558 04/25/16 1455  BP: 155/78 132/84  Pulse: 92 88  Temp: 99.2 F (37.3 C) 99 F (37.2 C)  Resp: 17 18    General: has remained afebrile, no chest pain, no shortness of breath. Patient reports some mild headache intermittently.  Cardiovascular: S1 and S2, no rubs, no gallops, no murmurs  Respiratory: Good air movement bilaterally, no wheezing/crackles, no use of accessory  muscles; good oxygen saturation on room air  Abdomen: Soft, obese, nontender, nondistended, positive bowel sounds  Musculoskeletal: No edema or cyanosis in his lower  extremities; right upper extremity with very mild erythematous changes where PICC line was placed.   Discharge Instructions   Discharge Instructions    Call MD for:  persistant nausea and vomiting    Complete by:  As directed      Call MD for:  redness, tenderness, or signs of infection (pain, swelling, redness, odor or green/yellow discharge around incision site)    Complete by:  As directed      Call MD for:  severe uncontrolled pain    Complete by:  As directed      Diet - low sodium heart healthy    Complete by:  As directed      Discharge instructions    Complete by:  As directed   Keep yourself well hydrated Follow up with Infectious disease service as scheduled Follow heart healthy and low calorie diet  Arrange follow up with ENT provider as discussed     Increase activity slowly    Complete by:  As directed           Current Discharge Medication List    CONTINUE these medications which have NOT CHANGED   Details  ALPRAZolam (XANAX) 1 MG tablet Take 1 mg by mouth 2 (two) times daily as needed for anxiety.    buPROPion (WELLBUTRIN) 75 MG tablet Take 1 tablet (75 mg total) by mouth daily. Qty: 90 tablet, Refills: 3    celecoxib (CELEBREX) 200 MG capsule TAKE ONE CAPSULE BY MOUTH EVERY DAY Qty: 90 capsule, Refills: 3   Associated Diagnoses: Primary osteoarthritis involving multiple joints    diphenhydrAMINE (BENADRYL) 25 MG tablet Take 25 mg by mouth 2 (two) times daily.    EPINEPHrine (EPI-PEN) 0.3 mg/0.3 mL DEVI Inject 0.3 mLs (0.3 mg total) into the muscle once. Qty: 1 Device, Refills: prn    famotidine (PEPCID) 20 MG tablet Take 20 mg by mouth 2 (two) times daily. Refills: 0    losartan (COZAAR) 100 MG tablet Take 1 tablet (100 mg total) by mouth daily. Qty: 90 tablet, Refills: 2    Nutritional Supplements (JUICE PLUS FIBRE PO) Take 1 capsule by mouth daily.    omeprazole (PRILOSEC) 40 MG capsule TAKE 1 CAPSULE BY MOUTH TWICE A DAY Qty: 180 capsule,  Refills: 3   Associated Diagnoses: Esophagitis    saccharomyces boulardii (FLORASTOR) 250 MG capsule Take 1 capsule (250 mg total) by mouth 2 (two) times daily. Qty: 30 capsule, Refills: 0    topiramate (TOPAMAX) 50 MG tablet Take 1 tablet (50 mg total) by mouth 2 (two) times daily. Qty: 60 tablet, Refills: 11    verapamil (CALAN-SR) 120 MG CR tablet Take 1 tablet by mouth daily. Refills: 2      STOP taking these medications     clotrimazole (MYCELEX) 10 MG troche      vancomycin 1,250 mg in sodium chloride 0.9 % 250 mL        Allergies  Allergen Reactions  . Citrus Anaphylaxis and Other (See Comments)    Lemon  . Amoxicillin-Pot Clavulanate Nausea Only  . Sulfamethoxazole-Trimethoprim Other (See Comments)    Does not remember, long ago  . Vancomycin Other (See Comments)    Mild red mans syndrome, infuse more slowly.   Follow-up Information    Follow up with Scharlene Gloss, MD On 05/01/2016.   Specialty:  Infectious  Diseases   Contact information:   [address] [place] [address] [phone]       The results of significant diagnostics from this hospitalization (including imaging, microbiology, ancillary and laboratory) are listed below for reference.    Significant Diagnostic Studies: Dg Chest 2 View  04/22/2016  CLINICAL DATA:  Fever. EXAM: CHEST  2 VIEW COMPARISON:  Chest radiograph March 28, 2016 FINDINGS: Cardiomediastinal silhouette is normal. RIGHT PICC distal tip projects and mid superior vena cava. The lungs are clear without pleural effusions or focal consolidations. Trachea projects midline and there is no pneumothorax. Soft tissue planes and included osseous structures are non-suspicious. Large body habitus. IMPRESSION: No acute cardiopulmonary process. RIGHT PICC distal tip projects in mid superior vena cava, unchanged. Electronically Signed   By: Elon Alas M.D.   On: 04/22/2016 23:54   Dg Chest 2 View  03/28/2016  CLINICAL DATA:   Status post PICC placement. EXAM: CHEST  2 VIEW COMPARISON:  Radiograph of October 10, 2014. FINDINGS: The heart size and mediastinal contours are within normal limits. Both lungs are clear. No pneumothorax or pleural effusion is noted. Interval placement of right-sided PICC line with distal tip in expected position of SVC. The visualized skeletal structures are unremarkable. IMPRESSION: No active cardiopulmonary disease. Interval placement of right-sided PICC line with distal tip in expected position of SVC. Electronically Signed   By: Marijo Conception, M.D.   On: 03/28/2016 16:30   Ir Fluoro Guide Cv Line Right  04/15/2016  INDICATION: 30 year old male with a history of bacteremia. He presents for PICC placement EXAM: RIGHT PICC LINE PLACEMENT WITH ULTRASOUND AND FLUOROSCOPIC GUIDANCE MEDICATIONS: None ANESTHESIA/SEDATION: None FLUOROSCOPY TIME:  Fluoroscopy Time: 0 minutes 24 seconds (10.0 mGy). COMPLICATIONS: None PROCEDURE: The patient was advised of the possible risks and complications and agreed to undergo the procedure. The patient was then brought to the angiographic suite for the procedure. The right arm was prepped with chlorhexidine, draped in the usual sterile fashion using maximum barrier technique (cap and mask, sterile gown, sterile gloves, large sterile sheet, hand hygiene and cutaneous antisepsis) and infiltrated locally with 1% Lidocaine. Ultrasound demonstrated patency of the right basilic vein, and this was documented with an image. Under real-time ultrasound guidance, this vein was accessed with a 21 gauge micropuncture needle and image documentation was performed. A 0.018 wire was introduced in to the vein. Over this, a 5 [place] single lumen power injectable PICC was advanced to the lower SVC/right atrial junction. Fluoroscopy during the procedure and fluoro spot radiograph confirms appropriate catheter position. The catheter was flushed and covered with asterile dressing. Catheter length:  39 cm IMPRESSION: Status post placement of right basilic vein single-lumen power injectable PICC, measuring 39 cm. Catheter ready for use. Signed, Dulcy Fanny. Earleen Newport, DO [facility] Electronically Signed   By: Corrie Mckusick D.O.   On: 04/15/2016 15:47   Ir Fluoro Guide Cv Line Right  03/27/2016  INDICATION: Dental infection. Need for central venous access for long term intravenous antibiotics. EXAM: RIGHT UPPER EXTREMITY PICC LINE PLACEMENT WITH ULTRASOUND AND FLUOROSCOPIC GUIDANCE MEDICATIONS: 1% Lidocaine. ANESTHESIA/SEDATION: No sedation medication given. FLUOROSCOPY TIME:  Fluoroscopy Time: 0 minutes 24 seconds. COMPLICATIONS: None immediate. PROCEDURE: The patient was advised of the possible risks and complications and agreed to undergo the procedure. The patient was then brought to the angiographic suite for the procedure. The right arm was prepped with chlorhexidine, draped in the usual sterile fashion using maximum  barrier technique (cap and mask, sterile gown, sterile gloves, large sterile sheet, hand hygiene and cutaneous antisepsis) and infiltrated locally with 1% Lidocaine. Ultrasound demonstrated patency of the right basilic vein, and this was documented with an image. Under real-time ultrasound guidance, this vein was accessed with a 21 gauge micropuncture needle and image documentation was performed. A 0.018 wire was introduced in to the vein. Over this, a 5 [place] single lumen power-injectable PICC was advanced to the lower SVC/right atrial junction. Fluoroscopy during the procedure and fluoro spot radiograph confirms appropriate catheter position. The catheter was flushed and covered with a sterile dressing. Catheter length:  45 cm IMPRESSION: Successful right arm Power PICC line placement with ultrasound and fluoroscopic guidance. The catheter is ready for use. Read by:  Gareth Eagle, PA-C Electronically Signed   By: Corrie Mckusick D.O.   On:  03/27/2016 13:26   Ir US Guide Vasc Access Right  04/15/2016  INDICATION: 30 year old male with a history of bacteremia. He presents for PICC placement EXAM: RIGHT PICC LINE PLACEMENT WITH ULTRASOUND AND FLUOROSCOPIC GUIDANCE MEDICATIONS: None ANESTHESIA/SEDATION: None FLUOROSCOPY TIME:  Fluoroscopy Time: 0 minutes 24 seconds (10.0 mGy). COMPLICATIONS: None PROCEDURE: The patient was advised of the possible risks and complications and agreed to undergo the procedure. The patient was then brought to the angiographic suite for the procedure. The right arm was prepped with chlorhexidine, draped in the usual sterile fashion using maximum barrier technique (cap and mask, sterile gown, sterile gloves, large sterile sheet, hand hygiene and cutaneous antisepsis) and infiltrated locally with 1% Lidocaine. Ultrasound demonstrated patency of the right basilic vein, and this was documented with an image. Under real-time ultrasound guidance, this vein was accessed with a 21 gauge micropuncture needle and image documentation was performed. A 0.018 wire was introduced in to the vein. Over this, a 5 [place] single lumen power injectable PICC was advanced to the lower SVC/right atrial junction. Fluoroscopy during the procedure and fluoro spot radiograph confirms appropriate catheter position. The catheter was flushed and covered with asterile dressing. Catheter length: 39 cm IMPRESSION: Status post placement of right basilic vein single-lumen power injectable PICC, measuring 39 cm. Catheter ready for use. Signed, Dulcy Fanny. Earleen Newport, DO [facility] Radiology Electronically Signed   By: Corrie Mckusick D.O.   On: 04/15/2016 15:47   Ir US Guide Vasc Access Right  03/27/2016  INDICATION: Dental infection. Need for central venous access for long term intravenous antibiotics. EXAM: RIGHT UPPER EXTREMITY PICC LINE PLACEMENT WITH ULTRASOUND AND FLUOROSCOPIC GUIDANCE MEDICATIONS: 1% Lidocaine.  ANESTHESIA/SEDATION: No sedation medication given. FLUOROSCOPY TIME:  Fluoroscopy Time: 0 minutes 24 seconds. COMPLICATIONS: None immediate. PROCEDURE: The patient was advised of the possible risks and complications and agreed to undergo the procedure. The patient was then brought to the angiographic suite for the procedure. The right arm was prepped with chlorhexidine, draped in the usual sterile fashion using maximum barrier technique (cap and mask, sterile gown, sterile gloves, large sterile sheet, hand hygiene and cutaneous antisepsis) and infiltrated locally with 1% Lidocaine. Ultrasound demonstrated patency of the right basilic vein, and this was documented with an image. Under real-time ultrasound guidance, this vein was accessed with a 21 gauge micropuncture needle and image documentation was performed. A 0.018 wire was introduced in to the vein. Over this, a 5 [place] single lumen power-injectable PICC was advanced to the lower SVC/right atrial junction. Fluoroscopy during the procedure and fluoro spot radiograph confirms appropriate catheter position. The catheter was flushed and  covered with a sterile dressing. Catheter length:  45 cm IMPRESSION: Successful right arm Power PICC line placement with ultrasound and fluoroscopic guidance. The catheter is ready for use. Read by:  Gareth Eagle, PA-C Electronically Signed   By: Corrie Mckusick D.O.   On: 03/27/2016 13:26    Microbiology: Recent Results (from the past 240 hour(s))  Blood culture (routine x 2)     Status: None (Preliminary result)   Collection Time: 04/22/16 10:05 PM  Result Value Ref Range Status   Specimen Description BLOOD LEFT ARM  Final   Special Requests BOTTLES DRAWN AEROBIC AND ANAEROBIC 5CC  Final   Culture NO GROWTH 2 DAYS  Final   Report Status PENDING  Incomplete  Blood culture (routine x 2)     Status: None (Preliminary result)   Collection Time: 04/22/16 10:17 PM  Result Value Ref Range Status   Specimen Description BLOOD  LEFT HAND  Final   Special Requests BOTTLES DRAWN AEROBIC AND ANAEROBIC 5CC  Final   Culture NO GROWTH 2 DAYS  Final   Report Status PENDING  Incomplete  MRSA PCR Screening     Status: None   Collection Time: 04/23/16 11:01 AM  Result Value Ref Range Status   MRSA by PCR NEGATIVE NEGATIVE Final    Comment:        The GeneXpert MRSA Assay (FDA approved for NASAL specimens only), is one component of a comprehensive MRSA colonization surveillance program. It is not intended to diagnose MRSA infection nor to guide or monitor treatment for MRSA infections.   Respiratory Panel by PCR     Status: None   Collection Time: 04/23/16 11:25 AM  Result Value Ref Range Status   Adenovirus NOT DETECTED NOT DETECTED Final   Coronavirus 229E NOT DETECTED NOT DETECTED Final   Coronavirus HKU1 NOT DETECTED NOT DETECTED Final   Coronavirus NL63 NOT DETECTED NOT DETECTED Final   Coronavirus OC43 NOT DETECTED NOT DETECTED Final   Metapneumovirus NOT DETECTED NOT DETECTED Final   Rhinovirus / Enterovirus NOT DETECTED NOT DETECTED Final   Influenza A NOT DETECTED NOT DETECTED Final   Influenza A H1 NOT DETECTED NOT DETECTED Final   Influenza A H1 2009 NOT DETECTED NOT DETECTED Final   Influenza A H3 NOT DETECTED NOT DETECTED Final   Influenza B NOT DETECTED NOT DETECTED Final   Parainfluenza Virus 1 NOT DETECTED NOT DETECTED Final   Parainfluenza Virus 2 NOT DETECTED NOT DETECTED Final   Parainfluenza Virus 3 NOT DETECTED NOT DETECTED Final   Parainfluenza Virus 4 NOT DETECTED NOT DETECTED Final   Respiratory Syncytial Virus NOT DETECTED NOT DETECTED Final   Bordetella pertussis NOT DETECTED NOT DETECTED Final   Chlamydophila pneumoniae NOT DETECTED NOT DETECTED Final   Mycoplasma pneumoniae NOT DETECTED NOT DETECTED Final     Labs: Basic Metabolic Panel:  Recent Labs Lab 04/19/16 0830 04/22/16 1829 04/23/16 0955 04/24/16 0443  NA 136 137 138 138  K 4.0 3.6 3.6 3.6  CL 108 106 108 107   CO2 23 22 24 22   GLUCOSE 112* 92 115* 80  BUN 13 6 9 9   CREATININE 1.07 1.27* 1.11 1.08  CALCIUM 8.4* 8.9 8.8* 8.6*  MG  --   --  2.0  --   PHOS  --   --  3.0  --    Liver Function Tests:  Recent Labs Lab 04/22/16 1829 04/23/16 0955  AST 24 21  ALT 40 34  ALKPHOS 67 58  BILITOT 1.1 1.1  PROT 7.1 6.9  ALBUMIN 3.5 3.3*   CBC:  Recent Labs Lab 04/19/16 0830 04/22/16 1829 04/23/16 0955  WBC 8.0 8.1 6.5  NEUTROABS  --  5.1  --   HGB 13.5 14.2 13.2  HCT 41.4 44.1 40.5  MCV 82.0 81.4 81.2  PLT 230 228 188   Signed:  Barton Dubois MD.  [facility] 04/25/2016, 4:31 PM

## 2016-04-27 LAB — CULTURE, BLOOD (ROUTINE X 2)
Culture: NO GROWTH
Culture: NO GROWTH

## 2016-04-29 ENCOUNTER — Telehealth: Payer: Self-pay | Admitting: Emergency Medicine

## 2016-04-29 ENCOUNTER — Encounter: Payer: Self-pay | Admitting: Infectious Diseases

## 2016-04-29 DIAGNOSIS — M542 Cervicalgia: Secondary | ICD-10-CM | POA: Diagnosis not present

## 2016-04-29 DIAGNOSIS — M9903 Segmental and somatic dysfunction of lumbar region: Secondary | ICD-10-CM | POA: Diagnosis not present

## 2016-04-29 DIAGNOSIS — M545 Low back pain: Secondary | ICD-10-CM | POA: Diagnosis not present

## 2016-04-29 DIAGNOSIS — I808 Phlebitis and thrombophlebitis of other sites: Secondary | ICD-10-CM | POA: Diagnosis not present

## 2016-04-29 DIAGNOSIS — M9901 Segmental and somatic dysfunction of cervical region: Secondary | ICD-10-CM | POA: Diagnosis not present

## 2016-04-29 NOTE — Telephone Encounter (Signed)
Acute visit with Alexis [name] in [place] made.

## 2016-04-29 NOTE — Telephone Encounter (Signed)
I can't get to this today

## 2016-04-29 NOTE — Telephone Encounter (Signed)
Pts mother called and pt was released from the hospital on 04/25/16. His arm is very red and swollen where the IV site was. Is there a way he can be worked in. Please advise and give her a call back thanks.

## 2016-04-30 ENCOUNTER — Ambulatory Visit: Payer: Self-pay | Admitting: Adult Health

## 2016-05-01 ENCOUNTER — Telehealth: Payer: Self-pay

## 2016-05-01 ENCOUNTER — Encounter: Payer: Self-pay | Admitting: Infectious Diseases

## 2016-05-01 ENCOUNTER — Encounter (HOSPITAL_COMMUNITY): Payer: Self-pay | Admitting: Dentistry

## 2016-05-01 ENCOUNTER — Ambulatory Visit (INDEPENDENT_AMBULATORY_CARE_PROVIDER_SITE_OTHER): Payer: BLUE CROSS/BLUE SHIELD | Admitting: Infectious Diseases

## 2016-05-01 ENCOUNTER — Ambulatory Visit (HOSPITAL_COMMUNITY)
Admission: RE | Admit: 2016-05-01 | Discharge: 2016-05-01 | Disposition: A | Discharge status: Home / Self Care | Payer: BLUE CROSS/BLUE SHIELD | Source: Ambulatory Visit | Attending: Infectious Diseases | Admitting: Infectious Diseases

## 2016-05-01 VITALS — BP 137/92 | HR 105 | Temp 98.0°F | Ht 78.0 in | Wt 393.8 lb

## 2016-05-01 DIAGNOSIS — R7881 Bacteremia: Secondary | ICD-10-CM | POA: Diagnosis not present

## 2016-05-01 DIAGNOSIS — N179 Acute kidney failure, unspecified: Secondary | ICD-10-CM | POA: Diagnosis not present

## 2016-05-01 DIAGNOSIS — K047 Periapical abscess without sinus: Secondary | ICD-10-CM

## 2016-05-01 DIAGNOSIS — I10 Essential (primary) hypertension: Secondary | ICD-10-CM

## 2016-05-01 DIAGNOSIS — I808 Phlebitis and thrombophlebitis of other sites: Secondary | ICD-10-CM

## 2016-05-01 DIAGNOSIS — B9562 Methicillin resistant Staphylococcus aureus infection as the cause of diseases classified elsewhere: Secondary | ICD-10-CM

## 2016-05-01 LAB — CBC
HEMATOCRIT: 45 % (ref 38.5–50.0)
HEMOGLOBIN: 15.3 g/dL (ref 13.2–17.1)
MCH: 26.8 pg — AB (ref 27.0–33.0)
MCHC: 34 g/dL (ref 32.0–36.0)
MCV: 78.9 fL — ABNORMAL LOW (ref 80.0–100.0)
MPV: 10.1 fL (ref 7.5–12.5)
Platelets: 296 10*3/uL (ref 140–400)
RBC: 5.7 MIL/uL (ref 4.20–5.80)
RDW: 15 % (ref 11.0–15.0)
WBC: 10.3 10*3/uL (ref 3.8–10.8)

## 2016-05-01 LAB — COMPREHENSIVE METABOLIC PANEL
ALK PHOS: 77 U/L (ref 40–115)
ALT: 46 U/L (ref 9–46)
AST: 23 U/L (ref 10–40)
Albumin: 3.9 g/dL (ref 3.6–5.1)
BUN: 10 mg/dL (ref 7–25)
CALCIUM: 8.5 mg/dL — AB (ref 8.6–10.3)
CHLORIDE: 108 mmol/L (ref 98–110)
CO2: 19 mmol/L — ABNORMAL LOW (ref 20–31)
Creat: 0.95 mg/dL (ref 0.60–1.35)
GLUCOSE: 88 mg/dL (ref 65–99)
POTASSIUM: 4 mmol/L (ref 3.5–5.3)
Sodium: 137 mmol/L (ref 135–146)
Total Bilirubin: 1.1 mg/dL (ref 0.2–1.2)
Total Protein: 7.1 g/dL (ref 6.1–8.1)

## 2016-05-01 NOTE — Assessment & Plan Note (Signed)
Will repeat his Cr today 

## 2016-05-01 NOTE — Progress Notes (Signed)
   Subjective:    Patient ID: Alexis [name], male    DOB: 1986-08-13, 30 y.o.   MRN: [id number]  HPI 30 yo M with hx of obesity, chronic sinusitis,root canal 8 yrs ago. States he has a dental root in one of his sinuses. He was to have crown (on this tooth) this year but needed "re-treatment". He was seen by periodontist (Dr Loyal Gambler) and had intervention in March 2017. Was started on doxy.  Facial fullness improved. Was ready for crown, had temp put on.  He had return of fullness and saw Dr Loyal Gambler again 6-6. Saw "shadow" on root of tooth.  Was started on clinda. He has remained on this.  Has had worsening sore throat since, cough congestion, cough with green sputum/blood tinged.  Roof of his mouth feels hypersensitive.  CT scan on 6-15 showed progression of mucosal edema in R maxillary sinus, progression of B ethmoid mucosal edema. He was seen in ID on 6-16 and was started on Invanz on 6-17 via a PIC line placed at IR.  On 6-17 he went to the ED after having palpitations. There was some confusion per our nursing and his PIC order as his PIC was not completed via our orders.  He was seen by dental on 6-20, he deferred having an extraction at that time.  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 It was removed, his TEE was (-). His invanz was stopped on 7-5, he had a new PIC placed. He was sent home on vanco to finish on 7-14.  He was seen in ED on 7-13 after having elevated Vanco levels (21, 24), ARF (attributed to vanco, ARB, celebryx), and local erythema at his [facility] site. He was treated with ceftriaxone as well.  His vanco was stopped at d/c, his PIC removed and he had repeat BCx that were negative.   Was seen by UC on 7-19 and was told he had phlebitis. He was given clinda which he has not started. He has been elevating, using warm compresses. Was told to rtc if not better in 3 days.    Feels like his sinuses have cleared up. He does feel some abn  sensations around his tooth. He had a crown placed of June 22.   No further f/c.  Slight cough, improved from his last visit.  He had L cervical LAN July adm as well as L facial swelling. This has since improved.   Review of Systems  Constitutional: Negative for fever and chills.  HENT: Negative for postnasal drip.   Respiratory: Positive for cough. Negative for shortness of breath.   Musculoskeletal: Positive for myalgias.  Neurological: Negative for headaches.   Please see HPI. 12 point ROS o/w (-)     Objective:   Physical Exam  Constitutional: He appears well-developed and well-nourished.  HENT:  Mouth/Throat: No oropharyngeal exudate.  Eyes: EOM are normal. Pupils are equal, round, and reactive to light.  Neck: Neck supple. No tracheal deviation present.  Cardiovascular: Normal rate, regular rhythm and normal heart sounds.   Pulmonary/Chest: Effort normal and breath sounds normal.  Abdominal: Soft. Bowel sounds are normal. There is no tenderness. There is no rebound.  Musculoskeletal:       Arms: Lymphadenopathy:    He has no cervical adenopathy.      Assessment & Plan:

## 2016-05-01 NOTE — Telephone Encounter (Signed)
Called patient to tell him Dr. Johnnye Sima said to continue on with the orders given to him during the visit in clinic today and if area worsens over weekend he is doctor on call and to call the hospital to get in touch with him. Patient verablized understanding. Rodman Key, LPN

## 2016-05-01 NOTE — Telephone Encounter (Signed)
Called Cardiovascular to let them know Dr. Johnnye Sima put in a stat order order for a doppler for patient. No answer. Left message on voice mail. Rodman Key, LPN

## 2016-05-01 NOTE — Assessment & Plan Note (Signed)
This appears to be improved.  i have encouraged him to f/u with dental surgery/ENT to have definitive therapy

## 2016-05-01 NOTE — Progress Notes (Addendum)
*  PRELIMINARY RESULTS* Vascular Ultrasound Left upper extremity venous duplex has been completed.  Preliminary findings: Superficial thrombosis noted in the left basilic vein, extending from just above AC fossa to mid forearm. No evidence of DVT.    Called results to Tammy. 2:05 pm Instructed for patient to wait here until office calls back. 2:20pm Patient ok to leave and Candace will call him with further instructions.    Landry Mellow, RDMS, RVT  05/01/2016, 2:05 PM

## 2016-05-01 NOTE — Assessment & Plan Note (Signed)
Will send him for doppler ASAP.  Continue compresses, elevation By Uptodate, atbx are not indicated.  Will reconsider if WBC elevated, fever (98.9 today) or BCx positive.  rtc in 1 week.

## 2016-05-01 NOTE — Assessment & Plan Note (Signed)
Will repeat his BCx today

## 2016-05-01 NOTE — Assessment & Plan Note (Signed)
He is off rx Will repeat his Cr today.

## 2016-05-01 NOTE — Telephone Encounter (Signed)
Several attempts were made by myself and supervisor to call and let vascular know that Dr. Johnnye Sima made the decision to send patient stat to Cardiovascular ultrasound for doppler before a voicemail was left. Rodman Key, LPN

## 2016-05-06 ENCOUNTER — Encounter: Payer: Self-pay | Admitting: Infectious Diseases

## 2016-05-06 ENCOUNTER — Ambulatory Visit (INDEPENDENT_AMBULATORY_CARE_PROVIDER_SITE_OTHER): Payer: BLUE CROSS/BLUE SHIELD | Admitting: Infectious Diseases

## 2016-05-06 DIAGNOSIS — K047 Periapical abscess without sinus: Secondary | ICD-10-CM | POA: Diagnosis not present

## 2016-05-06 DIAGNOSIS — I808 Phlebitis and thrombophlebitis of other sites: Secondary | ICD-10-CM

## 2016-05-06 DIAGNOSIS — J0100 Acute maxillary sinusitis, unspecified: Secondary | ICD-10-CM

## 2016-05-06 DIAGNOSIS — M545 Low back pain: Secondary | ICD-10-CM | POA: Diagnosis not present

## 2016-05-06 DIAGNOSIS — M9901 Segmental and somatic dysfunction of cervical region: Secondary | ICD-10-CM | POA: Diagnosis not present

## 2016-05-06 DIAGNOSIS — M542 Cervicalgia: Secondary | ICD-10-CM | POA: Diagnosis not present

## 2016-05-06 DIAGNOSIS — M9903 Segmental and somatic dysfunction of lumbar region: Secondary | ICD-10-CM | POA: Diagnosis not present

## 2016-05-06 NOTE — Progress Notes (Signed)
   Subjective:    Patient ID: Alexis [name], male    DOB: 15-Nov-1985, 30 y.o.   MRN: [id number]  HPI 30 yo M with hx of obesity, chronic sinusitis,root canal 8 yrs ago. States he has a dental root in one of his sinuses. He was to have crown (on this tooth) this year but needed "re-treatment". He was seen by periodontist (Dr Loyal Gambler) and had intervention in March 2017. Was started on doxy.  Facial fullness improved. Was ready for crown, had temp put on.  He had return of fullness and saw Dr Loyal Gambler again 6-6. Saw "shadow" on root of tooth.  Was started on clinda. He has remained on this.  Has had worsening sore throat since, cough congestion, cough with green sputum/blood tinged.  Roof of his mouth feels hypersensitive.  CT scan on 6-15 showed progression of mucosal edema in R maxillary sinus, progression of B ethmoid mucosal edema. He was seen in ID on 6-16 and was started on Invanz on 6-17 via a PIC line placed at IR.  On 6-17 he went to the ED after having palpitations. There was some confusion per our nursing and his PIC order as his PIC was not completed via our orders.  He was seen by dental on 6-20, he deferred having an extraction at that time.  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 It was removed, his TEE was (-). His invanz was stopped on 7-5, he had a new PIC placed. He was sent home on vanco to finish on 7-14.  He was seen in ED on 7-13 after having elevated Vanco levels (21, 24), ARF (attributed to vanco, ARB, celebryx), and local erythema at his [facility] site. He was treated with ceftriaxone as well.  His vanco was stopped at d/c, his PIC removed and he had repeat BCx that were negative.   Was seen by UC on 7-19 and was told he had phlebitis. He was given clinda which he has not started. He has been elevating, using warm compresses. Was told to rtc if not better in 3 days.   He was seen in ID clinic for f/u on 7-26 and had [place] of his arm at that  time (-).  Had one day of sinus congestion since last visit.  His LUE cordis is still present but significantly less tender and swollen.    Review of Systems     Objective:   Physical Exam  Constitutional: He appears well-developed and well-nourished. No distress.  Musculoskeletal:       Arms:         Assessment & Plan:

## 2016-05-06 NOTE — Assessment & Plan Note (Signed)
He is doing well. Will f/u with dental and ent

## 2016-05-06 NOTE — Assessment & Plan Note (Signed)
Improving, local. Continue supportive care  Will rtc prn

## 2016-05-07 LAB — CULTURE, BLOOD (SINGLE)
Organism ID, Bacteria: NO GROWTH
Organism ID, Bacteria: NO GROWTH

## 2016-05-14 ENCOUNTER — Encounter: Payer: Self-pay | Admitting: Infectious Diseases

## 2016-06-05 DIAGNOSIS — H47323 Drusen of optic disc, bilateral: Secondary | ICD-10-CM | POA: Diagnosis not present

## 2016-06-05 DIAGNOSIS — H40013 Open angle with borderline findings, low risk, bilateral: Secondary | ICD-10-CM | POA: Diagnosis not present

## 2016-06-08 ENCOUNTER — Other Ambulatory Visit: Payer: Self-pay | Admitting: Internal Medicine

## 2016-06-08 DIAGNOSIS — I10 Essential (primary) hypertension: Secondary | ICD-10-CM

## 2016-06-09 ENCOUNTER — Encounter: Payer: Self-pay | Admitting: Neurology

## 2016-06-09 ENCOUNTER — Ambulatory Visit (INDEPENDENT_AMBULATORY_CARE_PROVIDER_SITE_OTHER): Payer: BLUE CROSS/BLUE SHIELD | Admitting: Neurology

## 2016-06-09 VITALS — BP 145/94 | HR 107 | Ht 78.0 in | Wt >= 6400 oz

## 2016-06-09 DIAGNOSIS — I8290 Acute embolism and thrombosis of unspecified vein: Secondary | ICD-10-CM

## 2016-06-09 DIAGNOSIS — R519 Headache, unspecified: Secondary | ICD-10-CM

## 2016-06-09 DIAGNOSIS — H05119 Granuloma of unspecified orbit: Secondary | ICD-10-CM | POA: Diagnosis not present

## 2016-06-09 DIAGNOSIS — H905 Unspecified sensorineural hearing loss: Secondary | ICD-10-CM

## 2016-06-09 DIAGNOSIS — H4710 Unspecified papilledema: Secondary | ICD-10-CM

## 2016-06-09 DIAGNOSIS — R51 Headache: Secondary | ICD-10-CM | POA: Diagnosis not present

## 2016-06-09 DIAGNOSIS — H53133 Sudden visual loss, bilateral: Secondary | ICD-10-CM

## 2016-06-09 DIAGNOSIS — G932 Benign intracranial hypertension: Secondary | ICD-10-CM | POA: Diagnosis not present

## 2016-06-09 DIAGNOSIS — H9190 Unspecified hearing loss, unspecified ear: Secondary | ICD-10-CM

## 2016-06-09 NOTE — Progress Notes (Signed)
[name]    Provider:  Dr Jaynee Eagles Referring Provider: Janith Lima, MD Primary Care Physician:  Scarlette Calico, MD  CC: Headache  Interval update 06/09/2016: 30 year old male with a past medical history of migraines and morbid obesity here for vision changes, possible papilledema seen on ophthalmology examination recently. [place] and his vision went blurry, very fuzzy and blurry for about 4-5 minutes.  Happened 3 times, it was significant. Both eyes. Happened again the next day. It was not an aura, it was blurry. Went away eventually, blinking eyes. He continues to have episodes of significant vision changes. He is having 1-2 significant migraine a month. He has had episodes of feeling like his head is going to explode, severe pressure all around the head with 10 out of 10 pain. Not worse with laying down, he has noticed some new hearing changes, more of a ringing. Discussion with patient and his mother. Symptoms are concerning for intracranial hypertension. Need to rule out tumors or masses causing the increased pressure as well as other intracranial etiologies. Discussed all the imaging needed as well as lumbar puncture and reasons why. Also discussed possible findings and how we would treat them. If consistent with idiopathic intracranial hypertension would likely start on Diamox. Patient has been on doxycycline recently for multiple infections and other medications as well, he recently spent 14 days in the hospital for infections and sepsis. No history of high doses of vitamin A use or other medications that are associated with pseudotumor cerebri. Discussed that weight loss is key in pseudotumor cerebri. Also discussed that untreated can cause permanent vision loss. She does vision or headaches worsen he is to proceed to the emergency room immediately.  Interval Update 02/27/2016:  He ws at work. Last Wednesday. He had acute onset of left eye vision  loss. Then had a headache and lasted all day. Would not go away. Typical headache migraines, left side, throbbing, light sensitivity, sound sensitivity. He has a lot of light and sound sensitivity. No nausea or vomiting with the headaches. He has 4-5 headache days a month. Not typically any are migrainous. Had taken imitrex orally in the past. Migraines can be quick. He wakes up with a headache. He had a sleep study and he does not have sleep apnea. Did not need a cpap. They are not serious, they tend to go away quickly. Not often does he have the headaches. He is on Topamax and Verapamil and doing well.   Initial visit 11/14/2014 : Alexis [name] is a 30 y.o. male here as a referral from Dr. Ronnald Ramp for headache.   Migraines are well controlled if he takes the indomethacin. If he falls asleep and forgets the indomethacin on the couch he will wake up with a headache. He will wake up at 4am with a headache. He is on Verapamil and has not missed a dose. He does not have a lot of nausea with the migraines. No signs of OSA.   He is on Wellbutrin. He says that he has been well on Wellbutrin for many years but he is unsure if it is helping him anymore. He has decreased energy and decreased concentration and he has gained weight and that makes him depressed.  Discussed that people with headaches often have decreased serotonin levels and that if location is not helping maybe we should change to an SSRI.    11/14/2014: Patient is here as an add on for new onset  severe headache. PMHx of occular migraines. He is on 120mg  verapamil and completely controlled. Friday night they were at the theatre. The most he laughed the pressure was building in his head. It is on the left side. Throbbing is constant. He also feels stabbing in the brain in the temporal area. He has constant pain Extremely painful. The pain was so bad that his BP shot up when he was on telemetry in the ED. The pain is constant but then he has periods  of elevated pain in the head, the pain stabs and holds for a minute. The worst pain he has ever felt in his life. Severe 10/10. Eye waters, keeps getting hot flashes, clammy feeling. +light sensitivity. It has been continuous. No weakness, vision. Has some pulsatile hearing changes. He has been sick recently and has a sore throat and a sinus infection.  Reviewed notes, labs and imaging from outside physicians, which showed: MRI brain w/wo contrast 04/2012 showed no acute intracranial abnormalities including mass lesion or mass effect, hydrocephalus, extra-axial fluid collection, midline shift, hemorrhage, or acute infarction, large ischemic events (personally reviewed images). BMP/CMP unremarkable.   Review of Systems: Patient complains of symptoms per HPI as well as the following symptoms: Light sensitivity, loss of vision, blurred vision, ringing in ears, headache. Pertinent negatives per HPI. All others negative.  Social History   Social History  . Marital status: Single    Spouse name: N/A  . Number of children: 0  . Years of education: College   Occupational History  . Paramedic  Marie History Main Topics  . Smoking status: Never Smoker  . Smokeless tobacco: Never Used  . Alcohol use No     Comment: socially  . Drug use: No  . Sexual activity: Not Currently   Other Topics Concern  . Not on file   Social History Narrative   Lives at home with parents.   Works at [employer].   Single.   Caffeine use: 3-4 cups daily.        Family History  Problem Relation Age of Onset  . Hypertension Father   . Diabetes Father     type 2   . Emphysema Maternal Grandmother     smoker  . Asthma Maternal Grandmother   . Lung cancer Maternal Grandmother   . Allergies      whole family-both sides of family  . Asthma Mother   . Clotting disorder Mother     Factor 5  . Rheum arthritis Mother   . Skin cancer Mother   . Asthma Paternal Grandmother   . Diabetes Paternal  Grandmother     type 2  . Heart disease      MGM deceased with MI; both sides of grandparents  . Clotting disorder Maternal Uncle     Factor 5    Past Medical History:  Diagnosis Date  . Acid reflux   . ADD 09/13/2007  . ALLERGIC RHINITIS 06/10/2009  . ASTHMA, UNSPECIFIED, UNSPECIFIED STATUS 07/19/2008  . DEPRESSIVE DISORDER 09/13/2007  . DYSMETABOLIC SYNDROME 0000000  . GERD 09/13/2007  . HYPERLIPIDEMIA 10/10/2009  . HYPERTENSION 09/13/2007  . IBS 03/20/2009  . Morbid obesity ([place]) 07/19/2008  . Skin cancer     Past Surgical History:  Procedure Laterality Date  . ANKLE SURGERY  2007  . APPENDECTOMY  2005  . SKIN CANCER EXCISION     teenager  . TEE WITHOUT CARDIOVERSION N/A 04/15/2016   Procedure: TRANSESOPHAGEAL ECHOCARDIOGRAM (TEE);  Surgeon: Sanda Klein, MD;  Location: [facility] ENDOSCOPY;  Service: Cardiovascular;  Laterality: N/A;    Current Outpatient Prescriptions  Medication Sig Dispense Refill  . ALPRAZolam (XANAX) 1 MG tablet Take 1 mg by mouth 2 (two) times daily as needed for anxiety.    Marland Kitchen buPROPion (WELLBUTRIN) 75 MG tablet Take 1 tablet (75 mg total) by mouth daily. 90 tablet 3  . celecoxib (CELEBREX) 200 MG capsule TAKE ONE CAPSULE BY MOUTH EVERY DAY (Patient taking differently: Take 200 mg by mouth daily as needed for moderate pain. ) 90 capsule 3  . EPINEPHrine (EPI-PEN) 0.3 mg/0.3 mL DEVI Inject 0.3 mLs (0.3 mg total) into the muscle once. 1 Device prn  . losartan (COZAAR) 100 MG tablet Take 1 tablet (100 mg total) by mouth daily. 90 tablet 2  . Nutritional Supplements (JUICE PLUS FIBRE PO) Take 1 capsule by mouth daily.    Marland Kitchen omeprazole (PRILOSEC) 40 MG capsule TAKE 1 CAPSULE BY MOUTH TWICE A DAY 180 capsule 3  . topiramate (TOPAMAX) 50 MG tablet Take 1 tablet (50 mg total) by mouth 2 (two) times daily. 60 tablet 11  . verapamil (CALAN-SR) 120 MG CR tablet Take 1 tablet by mouth daily.  2  . verapamil (CALAN-SR) 120 MG CR tablet TAKE ONE CAPSULE BY MOUTH EVERY DAY 90  tablet 2   No current facility-administered medications for this visit.     Allergies as of 06/09/2016 - Review Complete 05/06/2016  Allergen Reaction Noted  . Citrus Anaphylaxis and Other (See Comments) 09/04/2012  . Amoxicillin-pot clavulanate Nausea Only   . Sulfamethoxazole-trimethoprim Other (See Comments)   . Vancomycin Other (See Comments) 04/11/2016    Vitals: BP (!) 145/94 (BP Location: Right Arm, Patient Position: Sitting, Cuff Size: Normal)   Pulse (!) 107   Ht 6\' 6"  (1.981 m)   Wt (!) 400 lb 12.8 oz (181.8 kg)   BMI 46.32 kg/m  Last Weight:  Wt Readings from Last 1 Encounters:  06/09/16 (!) 400 lb 12.8 oz (181.8 kg)   Last Height:   Ht Readings from Last 1 Encounters:  06/09/16 6\' 6"  (1.981 m)     Physical exam: Exam: Gen: NAD, conversant, well nourised, obese, well groomed  CV: RRR, no MRG. No Carotid Bruits. No peripheral edema, warm, nontender Eyes: Conjunctivae clear without exudates or hemorrhage  Neuro: Detailed Neurologic Exam  Speech:  Speech is normal; fluent and spontaneous with normal comprehension.  Cognition:  The patient is oriented to person, place, and time;   recent and remote memory intact;   language fluent;   normal attention, concentration,   fund of knowledge Cranial Nerves:  The pupils are equal, round, and reactive to light. Bilateral +1 possible papilledema. Visual fields are full to finger confrontation. Extraocular movements are intact. Trigeminal sensation is intact and the muscles of mastication are normal. The face is symmetric. The palate elevates in the midline. Hearing intact. Voice is normal. Shoulder shrug is normal. The tongue has normal motion without fasciculations.     Assessment/Plan: This is a lovely 30 year old male with migraines, recent onset vision changes, papilledema, hearing changes concerning for intracranial hypertension. We'll need an MRI of the brain and  orbits as well as an MRV and a lumbar puncture for opening pressure.  -. If consistent with idiopathic intracranial hypertension will likely start on Diamox.  Discussed that weight loss is key in pseudotumor cerebri. Also discussed that untreated IIH can cause permanent vision loss. If his  vision or headaches worsen  he is to proceed to the emergency room immediately.   Continue Topiramate Zembrace or Onzetra at onset of breakthrough migraine. Can repeat in 2 hours. Do not take more than 2 in one day or more than 2 days a week  Cc: Dr. Erie Noe, MD  [facility] [address] [place], [place] [address]  Phone [phone] Fax [phone]  A total of 45 minutes was spent face-to-face with this patient. Over half this time was spent on counseling patient on the migraine, intracranial hypertension diagnosis and different diagnostic and therapeutic options available.  and different diagnostic and therapeutic options available.

## 2016-06-09 NOTE — Patient Instructions (Addendum)
Remember to drink plenty of fluid, eat healthy meals and do not skip any meals. Try to eat protein with a every meal and eat a healthy snack such as fruit or nuts in between meals. Try to keep a regular sleep-wake schedule and try to exercise daily, particularly in the form of walking, 20-30 minutes a day, if you can.   As far as your medications are concerned, I would like to suggest: Continue Topiramate  As far as diagnostic testing: MRI of the brain and orbits, MRV of the head, lumbar puncture   I would like to see you back in 4-6 weeks, sooner if we need to. Please call us with any interim questions, concerns, problems, updates or refill requests.   Our phone number is [phone]. We also have an after hours call service for urgent matters and there is a physician on-call for urgent questions. For any emergencies you know to call 911 or go to the nearest emergency room

## 2016-06-10 ENCOUNTER — Telehealth: Payer: Self-pay | Admitting: *Deleted

## 2016-06-10 DIAGNOSIS — M545 Low back pain: Secondary | ICD-10-CM | POA: Diagnosis not present

## 2016-06-10 DIAGNOSIS — M542 Cervicalgia: Secondary | ICD-10-CM | POA: Diagnosis not present

## 2016-06-10 DIAGNOSIS — M9903 Segmental and somatic dysfunction of lumbar region: Secondary | ICD-10-CM | POA: Diagnosis not present

## 2016-06-10 DIAGNOSIS — M9901 Segmental and somatic dysfunction of cervical region: Secondary | ICD-10-CM | POA: Diagnosis not present

## 2016-06-10 NOTE — Telephone Encounter (Signed)
Dr Ahern- FYI 

## 2016-06-10 NOTE — Telephone Encounter (Signed)
[facility] called back to advise, they are unable to do LP due to weight capacity, recommends [facility] since they have Bariatric Unit there.

## 2016-06-10 NOTE — Telephone Encounter (Signed)
Alexis [name]- here is the phone note where patient needs to be scheduled at [facility] long, thank you.

## 2016-06-10 NOTE — Telephone Encounter (Signed)
Called and spoke to [place] at [facility] imaging. She can see order for LP and will call pt to schedule.

## 2016-06-10 NOTE — Telephone Encounter (Signed)
Patient's LP will be at [facility] Sept . 8th arrive at 9:30 for 10:00 apt. Patient will check in at Radiology on the first floor . I have spoke to patient with all details of apt and where to go. Patient has number to reschedule if something comes up. [phone].

## 2016-06-11 DIAGNOSIS — M9901 Segmental and somatic dysfunction of cervical region: Secondary | ICD-10-CM | POA: Diagnosis not present

## 2016-06-11 DIAGNOSIS — M542 Cervicalgia: Secondary | ICD-10-CM | POA: Diagnosis not present

## 2016-06-11 DIAGNOSIS — M9903 Segmental and somatic dysfunction of lumbar region: Secondary | ICD-10-CM | POA: Diagnosis not present

## 2016-06-11 DIAGNOSIS — M545 Low back pain: Secondary | ICD-10-CM | POA: Diagnosis not present

## 2016-06-15 ENCOUNTER — Other Ambulatory Visit: Payer: Self-pay | Admitting: Neurology

## 2016-06-15 ENCOUNTER — Other Ambulatory Visit: Payer: Self-pay | Admitting: Internal Medicine

## 2016-06-16 DIAGNOSIS — M545 Low back pain: Secondary | ICD-10-CM | POA: Diagnosis not present

## 2016-06-16 DIAGNOSIS — M542 Cervicalgia: Secondary | ICD-10-CM | POA: Diagnosis not present

## 2016-06-16 DIAGNOSIS — M9903 Segmental and somatic dysfunction of lumbar region: Secondary | ICD-10-CM | POA: Diagnosis not present

## 2016-06-16 DIAGNOSIS — M9901 Segmental and somatic dysfunction of cervical region: Secondary | ICD-10-CM | POA: Diagnosis not present

## 2016-06-18 ENCOUNTER — Ambulatory Visit (HOSPITAL_COMMUNITY): Payer: BLUE CROSS/BLUE SHIELD

## 2016-06-19 ENCOUNTER — Ambulatory Visit (HOSPITAL_COMMUNITY): Payer: BLUE CROSS/BLUE SHIELD

## 2016-06-29 ENCOUNTER — Ambulatory Visit
Admission: RE | Admit: 2016-06-29 | Discharge: 2016-06-29 | Disposition: A | Discharge status: Home / Self Care | Payer: BLUE CROSS/BLUE SHIELD | Source: Ambulatory Visit | Attending: Neurology | Admitting: Neurology

## 2016-06-29 DIAGNOSIS — H4710 Unspecified papilledema: Secondary | ICD-10-CM | POA: Diagnosis not present

## 2016-06-29 DIAGNOSIS — H9190 Unspecified hearing loss, unspecified ear: Secondary | ICD-10-CM

## 2016-06-29 DIAGNOSIS — H53133 Sudden visual loss, bilateral: Secondary | ICD-10-CM | POA: Diagnosis not present

## 2016-06-29 DIAGNOSIS — G932 Benign intracranial hypertension: Secondary | ICD-10-CM | POA: Diagnosis not present

## 2016-06-29 DIAGNOSIS — H05119 Granuloma of unspecified orbit: Secondary | ICD-10-CM

## 2016-06-29 DIAGNOSIS — R519 Headache, unspecified: Secondary | ICD-10-CM

## 2016-06-29 DIAGNOSIS — I8290 Acute embolism and thrombosis of unspecified vein: Secondary | ICD-10-CM

## 2016-06-29 DIAGNOSIS — R51 Headache: Secondary | ICD-10-CM

## 2016-06-29 MED ORDER — GADOBENATE DIMEGLUMINE 529 MG/ML IV SOLN
20.0000 mL | Freq: Once | INTRAVENOUS | Status: AC | PRN
  Administered 2016-06-29: 20 mL via INTRAVENOUS

## 2016-07-01 DIAGNOSIS — M542 Cervicalgia: Secondary | ICD-10-CM | POA: Diagnosis not present

## 2016-07-01 DIAGNOSIS — M9903 Segmental and somatic dysfunction of lumbar region: Secondary | ICD-10-CM | POA: Diagnosis not present

## 2016-07-01 DIAGNOSIS — M545 Low back pain: Secondary | ICD-10-CM | POA: Diagnosis not present

## 2016-07-01 DIAGNOSIS — M9901 Segmental and somatic dysfunction of cervical region: Secondary | ICD-10-CM | POA: Diagnosis not present

## 2016-07-17 ENCOUNTER — Ambulatory Visit (HOSPITAL_COMMUNITY)
Admission: RE | Admit: 2016-07-17 | Discharge: 2016-07-17 | Disposition: A | Discharge status: Home / Self Care | Payer: BLUE CROSS/BLUE SHIELD | Source: Ambulatory Visit | Attending: Neurology | Admitting: Neurology

## 2016-07-17 ENCOUNTER — Other Ambulatory Visit: Payer: Self-pay | Admitting: Diagnostic Radiology

## 2016-07-17 DIAGNOSIS — G932 Benign intracranial hypertension: Secondary | ICD-10-CM

## 2016-07-17 DIAGNOSIS — H4710 Unspecified papilledema: Secondary | ICD-10-CM | POA: Diagnosis not present

## 2016-07-17 LAB — CSF CELL COUNT WITH DIFFERENTIAL
Eosinophils, CSF: NONE SEEN % (ref 0–1)
RBC COUNT CSF: 7 /mm3 — AB
SEGMENTED NEUTROPHILS-CSF: NONE SEEN % (ref 0–6)
Tube #: 1
WBC CSF: 1 /mm3 (ref 0–5)

## 2016-07-17 LAB — PROTEIN, CSF: TOTAL PROTEIN, CSF: 38 mg/dL (ref 15–45)

## 2016-07-17 LAB — GLUCOSE, CSF: Glucose, CSF: 54 mg/dL (ref 40–70)

## 2016-07-17 MED ORDER — LIDOCAINE HCL (PF) 1 % IJ SOLN
5.0000 mL | Freq: Once | INTRAMUSCULAR | Status: AC
  Administered 2016-07-17: 15 mL via INTRADERMAL

## 2016-07-17 MED ORDER — ACETAMINOPHEN 325 MG PO TABS
650.0000 mg | ORAL_TABLET | ORAL | Status: DC | PRN
Start: 2016-07-17 — End: 2016-07-18
  Filled 2016-07-17: qty 2

## 2016-07-17 MED ORDER — LIDOCAINE HCL 1 % IJ SOLN
INTRAMUSCULAR | Status: AC
  Filled 2016-07-17: qty 10

## 2016-07-17 NOTE — Discharge Instructions (Signed)
Lumbar Puncture, Care After °Refer to this sheet in the next few weeks. These instructions provide you with information on caring for yourself after your procedure. Your health care provider may also give you more specific instructions. Your treatment has been planned according to current medical practices, but problems sometimes occur. Call your health care provider if you have any problems or questions after your procedure. °WHAT TO EXPECT AFTER THE PROCEDURE °After your procedure, it is typical to have the following sensations: °· Mild discomfort or pain at the insertion site. °· Mild headache that is relieved with pain medicines. °HOME CARE INSTRUCTIONS °· Avoid lifting anything heavier than 10 lb (4.5 kg) for at least 12 hours after the procedure. °· Drink enough fluids to keep your urine clear or pale yellow. °SEEK MEDICAL CARE IF: °· You have fever or chills. °· You have nausea or vomiting. °· You have a headache that lasts for more than 2 days. °SEEK IMMEDIATE MEDICAL CARE IF: °· You have any numbness or tingling in your legs. °· You are unable to control your bowel or bladder. °· You have bleeding or swelling in your back at the insertion site. °· You are dizzy or faint. °  °This information is not intended to replace advice given to you by your health care provider. Make sure you discuss any questions you have with your health care provider. °  °Document Released: 10/03/2013 Document Reviewed: 10/03/2013 °Elsevier Interactive Patient Education ©2016 Elsevier Inc. ° °

## 2016-07-20 LAB — CSF CULTURE: CULTURE: NO GROWTH

## 2016-07-20 LAB — CSF CULTURE W GRAM STAIN

## 2016-07-21 ENCOUNTER — Telehealth: Payer: Self-pay | Admitting: Neurology

## 2016-07-21 DIAGNOSIS — G971 Other reaction to spinal and lumbar puncture: Secondary | ICD-10-CM

## 2016-07-21 NOTE — Telephone Encounter (Signed)
Spoke to patient, he has positional headaches since LP. Needs a blood patch. He went to the hospital to have it done not [facility]. Would you order this please emma and call over to get an appointment please asap? thanks

## 2016-07-22 ENCOUNTER — Other Ambulatory Visit: Payer: Self-pay | Admitting: Radiology

## 2016-07-22 ENCOUNTER — Ambulatory Visit: Payer: BLUE CROSS/BLUE SHIELD | Admitting: Neurology

## 2016-07-22 NOTE — Telephone Encounter (Signed)
Called [facility] and spoke to [place] in fluoro department. Advised pt had LP done on 07/17/16 and now has post LP headache. Dr Jaynee Eagles requesting blood patch. She stated she will have to check to see if pt can have blood patch today. Dr that normally does them may not be there today. She will call me back to give me an update. Gave her [place] phone number for call back.

## 2016-07-22 NOTE — Telephone Encounter (Signed)
Placed order for blood patch to be done at [place] per Dr Jaynee Eagles request.

## 2016-07-22 NOTE — Telephone Encounter (Signed)
Dr Jaynee Eagles- [place]ier  Received called back from [facility] in interventional radiology at [facility].   He offered appt today at 11am, check in 1030am. Called pt while on the phone. Pt declined stating he does not have a ride to get home. He stated anytime tomorrow will work.   I spoke to [place] and scheduled for blood patch tomorrow at 12pm, check in 1145am. Pt is to go to radiology on the first floor. No special instructions to give to pt per Jeneen Rinks. Advised I will call pt back and let him know. He verbalized understanding.   Called pt back and relayed appt information above. Pt verbalized understanding.

## 2016-07-22 NOTE — Addendum Note (Signed)
Addended by: Hope Pigeon on: 07/22/2016 08:14 AM   Modules accepted: Orders

## 2016-07-22 NOTE — Telephone Encounter (Signed)
Called patient and LVM informing him we put order in for blood patch for [facility]. Advised they are checking to see if he can have procedure today. I am waiting for a call back to let me know. I will call him back once I know more. Gave [facility] phone number if he has questions in the meantime.

## 2016-07-23 ENCOUNTER — Encounter (HOSPITAL_COMMUNITY): Payer: Self-pay | Admitting: Interventional Radiology

## 2016-07-23 ENCOUNTER — Other Ambulatory Visit: Payer: Self-pay | Admitting: Radiology

## 2016-07-23 ENCOUNTER — Ambulatory Visit (HOSPITAL_COMMUNITY)
Admission: RE | Admit: 2016-07-23 | Discharge: 2016-07-23 | Disposition: A | Discharge status: Home / Self Care | Payer: BLUE CROSS/BLUE SHIELD | Source: Ambulatory Visit | Attending: Neurology | Admitting: Neurology

## 2016-07-23 DIAGNOSIS — I10 Essential (primary) hypertension: Secondary | ICD-10-CM | POA: Insufficient documentation

## 2016-07-23 DIAGNOSIS — H4710 Unspecified papilledema: Secondary | ICD-10-CM | POA: Insufficient documentation

## 2016-07-23 DIAGNOSIS — G971 Other reaction to spinal and lumbar puncture: Secondary | ICD-10-CM

## 2016-07-23 DIAGNOSIS — Y844 Aspiration of fluid as the cause of abnormal reaction of the patient, or of later complication, without mention of misadventure at the time of the procedure: Secondary | ICD-10-CM | POA: Insufficient documentation

## 2016-07-23 HISTORY — PX: IR GENERIC HISTORICAL: IMG1180011

## 2016-07-23 MED ORDER — IOPAMIDOL (ISOVUE-M 200) INJECTION 41%
INTRAMUSCULAR | Status: AC
  Administered 2016-07-23: 3 mL
  Filled 2016-07-23: qty 10

## 2016-07-23 MED ORDER — SODIUM CHLORIDE 0.9 % IJ SOLN
INTRAMUSCULAR | Status: AC
  Filled 2016-07-23: qty 10

## 2016-07-23 MED ORDER — SODIUM CHLORIDE 0.9 % IV SOLN: INTRAVENOUS | Status: DC

## 2016-07-23 MED ORDER — LIDOCAINE HCL (PF) 1 % IJ SOLN
INTRAMUSCULAR | Status: AC
  Filled 2016-07-23: qty 10

## 2016-07-23 MED ORDER — LIDOCAINE HCL (PF) 1 % IJ SOLN
INTRAMUSCULAR | Status: AC | PRN
  Administered 2016-07-23: 5 mL

## 2016-07-23 NOTE — Procedures (Signed)
Lumbar L4-5 blood patch No complication No blood loss. See complete dictation in [facility].

## 2016-07-23 NOTE — Progress Notes (Signed)
Patient ID: Alexis [name], male   DOB: 1986-09-22, 30 y.o.   MRN: [id number] Pt with hx migraines, HTN, papilledema; s/p LP at [facility] 10/6; tolerated procedure well; began to notice HA 10/8 ; HA improved with recumbent positioning and worsened with upright position. Presents today for blood patch. Details/risks of procedure, incl but not limited to, internal bleeding, infection, inability to relieve HA d/w pt/mother with their understanding and consent.

## 2016-08-02 ENCOUNTER — Other Ambulatory Visit: Payer: Self-pay | Admitting: Internal Medicine

## 2016-08-10 DIAGNOSIS — M542 Cervicalgia: Secondary | ICD-10-CM | POA: Diagnosis not present

## 2016-08-10 DIAGNOSIS — M545 Low back pain: Secondary | ICD-10-CM | POA: Diagnosis not present

## 2016-08-10 DIAGNOSIS — M9903 Segmental and somatic dysfunction of lumbar region: Secondary | ICD-10-CM | POA: Diagnosis not present

## 2016-08-10 DIAGNOSIS — M9901 Segmental and somatic dysfunction of cervical region: Secondary | ICD-10-CM | POA: Diagnosis not present

## 2016-08-13 DIAGNOSIS — J040 Acute laryngitis: Secondary | ICD-10-CM | POA: Diagnosis not present

## 2016-08-13 DIAGNOSIS — J301 Allergic rhinitis due to pollen: Secondary | ICD-10-CM | POA: Diagnosis not present

## 2016-08-13 DIAGNOSIS — J305 Allergic rhinitis due to food: Secondary | ICD-10-CM | POA: Diagnosis not present

## 2016-08-14 LAB — FUNGUS CULTURE WITH STAIN

## 2016-08-14 LAB — FUNGAL ORGANISM REFLEX

## 2016-08-14 LAB — FUNGUS CULTURE RESULT

## 2016-08-20 DIAGNOSIS — M9901 Segmental and somatic dysfunction of cervical region: Secondary | ICD-10-CM | POA: Diagnosis not present

## 2016-08-20 DIAGNOSIS — M545 Low back pain: Secondary | ICD-10-CM | POA: Diagnosis not present

## 2016-08-20 DIAGNOSIS — M9903 Segmental and somatic dysfunction of lumbar region: Secondary | ICD-10-CM | POA: Diagnosis not present

## 2016-08-20 DIAGNOSIS — M542 Cervicalgia: Secondary | ICD-10-CM | POA: Diagnosis not present

## 2016-08-25 ENCOUNTER — Encounter: Payer: Self-pay | Admitting: Allergy and Immunology

## 2016-08-25 ENCOUNTER — Ambulatory Visit (INDEPENDENT_AMBULATORY_CARE_PROVIDER_SITE_OTHER): Payer: BLUE CROSS/BLUE SHIELD | Admitting: Allergy and Immunology

## 2016-08-25 VITALS — BP 138/92 | HR 80 | Temp 98.4°F | Resp 20 | Ht 75.0 in | Wt >= 6400 oz

## 2016-08-25 DIAGNOSIS — T7840XA Allergy, unspecified, initial encounter: Secondary | ICD-10-CM | POA: Diagnosis not present

## 2016-08-25 DIAGNOSIS — R040 Epistaxis: Secondary | ICD-10-CM | POA: Diagnosis not present

## 2016-08-25 DIAGNOSIS — T7840XD Allergy, unspecified, subsequent encounter: Secondary | ICD-10-CM | POA: Diagnosis not present

## 2016-08-25 DIAGNOSIS — J3089 Other allergic rhinitis: Secondary | ICD-10-CM

## 2016-08-25 MED ORDER — MONTELUKAST SODIUM 10 MG PO TABS: 10.0000 mg | ORAL_TABLET | Freq: Every day | ORAL | 5 refills | Status: DC

## 2016-08-25 MED ORDER — MUPIROCIN 2 % EX OINT: 1.0000 "application " | TOPICAL_OINTMENT | Freq: Two times a day (BID) | CUTANEOUS | 0 refills | Status: DC

## 2016-08-25 MED ORDER — RANITIDINE HCL 300 MG PO TABS: 300.0000 mg | ORAL_TABLET | Freq: Every day | ORAL | 5 refills | Status: DC

## 2016-08-25 MED ORDER — EPINEPHRINE 0.3 MG/0.3ML IJ SOAJ: 0.3000 mg | Freq: Once | INTRAMUSCULAR | 1 refills | Status: AC

## 2016-08-25 NOTE — Patient Instructions (Addendum)
  1. Preventative therapy:   A. montelukast 10 mg tablet 1 time a day  B. cetirizine 10 mg tablet 1 time a day  C. ranitidine 300 mg tablet 1 time a day  2. EpiPen, Benadryl, M.D./ER for allergic reaction  3. Blood - alpha gal panel, CBC with differential, TSH, T4, TP, sed, citrus panel, C4  4. Further evaluation?  5. For nasal issue, use saline followed by bactroban ointment three times a day for the next month.  6. Contact clinic in 2 weeks or earlier if problem

## 2016-08-25 NOTE — Progress Notes (Signed)
Dear Dr. Ronnald [name],  Thank you for referring Alexis [name] to the [facility] on 08/25/2016.   Below is a summation of this patient's evaluation and recommendations.  Thank you for your referral. I will keep you informed about this patient's response to treatment.   If you have any questions please do not hesitate to contact me.   Sincerely,  Alexis Prows, MD [facility]   ______________________________________________________________________    NEW PATIENT NOTE  Referring Provider: Janith Lima, MD Primary Provider: Scarlette Calico, MD Date of office visit: 08/25/2016    Subjective:   Chief Complaint:  Alexis [name] (DOB: 1986/09/12) is a 30 y.o. male who presents to the clinic on 08/25/2016 with a chief complaint of Allergic Reaction (throat swelling) .     HPI: Alexis [name] presents to this clinic in evaluation of recurrent allergic reactions.  Apparently Alexis [name] has developed issues with uvular swelling and change in voice and throat tightness and some difficulty swallowing on several different occasions. Apparently he ended up in the emergency room 6 years ago for this issue and was given an EpiPen at that point in time and it was felt that it was secondary to citrus consumption. He's been without any citrus consumption since that point and has done very well up until 2 episodes that occurred in October or November. Once again he started with a very tight throat sensation and hoarseness for which he took Benadryl and Pepcid. His initial episode in October while visiting in [place] and eating at a brunch was better within approximately 1 hour after Benadryl and Pepcid. This past Sunday morning he woke up and he had uvular swelling and change in voice and difficulty swallowing and a tight throat andhe took Benadryl and Pepcid and although most of this issue has resolved his voice has not  returned to normal. There is no obvious provoking factor giving rise to these issues. He has no associated systemic or constitutional symptoms.  He does have a history of seasonal allergic rhinitis that over time has become perennial. He has lots of eye swelling and itchiness and runny nose. He's also having recurrent epistaxis affecting the right side more than the left side. He did have a history of childhood asthma and he rarely has any problems with asthma at this point time. A few years back he did develop some type of respiratory tract infection and developed an isolated episode of wheezing and coughing with that event.  He also has reflux for which he takes omeprazole which works relatively well. He does consume 3 coffees per day and tea per day.  Past Medical History:  Diagnosis Date  . Acid reflux   . ADD 09/13/2007  . ALLERGIC RHINITIS 06/10/2009  . ASTHMA, UNSPECIFIED, UNSPECIFIED STATUS 07/19/2008  . DEPRESSIVE DISORDER 09/13/2007  . DYSMETABOLIC SYNDROME 0000000  . GERD 09/13/2007  . HYPERLIPIDEMIA 10/10/2009  . HYPERTENSION 09/13/2007  . IBS 03/20/2009  . Morbid obesity ([place]) 07/19/2008  . Skin cancer     Past Surgical History:  Procedure Laterality Date  . ANKLE SURGERY  2007  . APPENDECTOMY  2005  . IR GENERIC HISTORICAL  07/23/2016   IR FLUORO GUIDED NEEDLE PLC ASPIRATION/INJECTION LOC 07/23/2016 Alexis Cleveland, MD [facility]INTER[facility] RAD  . SKIN CANCER EXCISION     teenager  . TEE WITHOUT CARDIOVERSION N/A 04/15/2016   Procedure: TRANSESOPHAGEAL ECHOCARDIOGRAM (TEE);  Surgeon: Alexis Klein, MD;  Location: [facility];  Service: Cardiovascular;  Laterality: N/A;      Medication List      ALPRAZolam 1 MG tablet Commonly known as:  XANAX Take 1 mg by mouth 2 (two) times daily as needed for anxiety.   buPROPion 75 MG tablet Commonly known as:  WELLBUTRIN Take 1 tablet (75 mg total) by mouth daily.   EPINEPHrine 0.3 mg/0.3 mL Soaj injection Commonly known as:   EPI-PEN Inject 0.3 mLs (0.3 mg total) into the muscle once.   losartan 100 MG tablet Commonly known as:  COZAAR Take 1 tablet by mouth daily.   losartan 100 MG tablet Commonly known as:  COZAAR TAKE 1 TABLET (100 MG TOTAL) BY MOUTH DAILY.   omeprazole 40 MG capsule Commonly known as:  PRILOSEC TAKE 1 CAPSULE BY MOUTH TWICE A DAY   topiramate 50 MG tablet Commonly known as:  TOPAMAX TAKE 1 TABLET (50 MG TOTAL) BY MOUTH 2 (TWO) TIMES DAILY.   verapamil 120 MG CR tablet Commonly known as:  CALAN-SR TAKE ONE CAPSULE BY MOUTH EVERY DAY       Allergies  Allergen Reactions  . Citrus Anaphylaxis and Other (See Comments)    Lemon  . Amoxicillin-Pot Clavulanate Nausea Only  . Sulfamethoxazole-Trimethoprim Other (See Comments)    Does not remember, long ago  . Vancomycin Other (See Comments)    Mild red mans syndrome, infuse more slowly.    Review of systems negative except as noted in HPI / PMHx or noted below:  Review of Systems  Constitutional: Negative.   HENT: Negative.   Eyes: Negative.   Respiratory: Negative.   Cardiovascular: Negative.   Gastrointestinal: Negative.   Genitourinary: Negative.   Musculoskeletal: Negative.   Skin: Negative.   Neurological: Negative.   Endo/Heme/Allergies: Negative.   Psychiatric/Behavioral: Negative.     Family History  Problem Relation Age of Onset  . Hypertension Father   . Diabetes Father     type 2   . Emphysema Maternal Grandmother     smoker  . Asthma Maternal Grandmother   . Lung cancer Maternal Grandmother   . Allergies      whole family-both sides of family  . Asthma Mother   . Clotting disorder Mother     Factor 5  . Rheum arthritis Mother   . Skin cancer Mother   . Asthma Paternal Grandmother   . Diabetes Paternal Grandmother     type 2  . Heart disease      MGM deceased with MI; both sides of grandparents  . Clotting disorder Maternal Uncle     Factor 5    Social History   Social History  .  Marital status: Single    Spouse name: N/A  . Number of children: 0  . Years of education: College   Occupational History  . Paramedic  [place] History Main Topics  . Smoking status: Never Smoker  . Smokeless tobacco: Never Used  . Alcohol use No     Comment: socially  . Drug use: No  . Sexual activity: Not Currently   Other Topics Concern  . Not on file   Social History Narrative   Lives at home with parents.   Works at [employer].   Single.   Caffeine use: 3-4 cups daily.        Environmental and Social history  Lives in a house with a dry environment, a dog located inside the household, no carpeting in the  bedroom, plastic on the bed and pillow, and no smoking ongoing with inside the household. 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  Objective:   Vitals:   08/25/16 1009  BP: (!) 138/92  Pulse: 80  Resp: 20  Temp: 98.4 F (36.9 C)   Height: 6\' 3"  (190.5 cm) Weight: (!) 404 lb (183.3 kg)  Physical Exam  Constitutional: He is well-developed, well-nourished, and in no distress.  HENT:  Head: Normocephalic. Head is without right periorbital erythema and without left periorbital erythema.  Right Ear: Tympanic membrane, external ear and ear canal normal.  Left Ear: Tympanic membrane, external ear and ear canal normal.  Nose: No mucosal edema or rhinorrhea. Epistaxis (Superficial nasal ulceration with bleeding on septum bilaterally) is observed.  Mouth/Throat: Oropharynx is clear and moist and mucous membranes are normal. No oropharyngeal exudate.  Eyes: Conjunctivae and lids are normal. Pupils are equal, round, and reactive to light.  Neck: Trachea normal. No tracheal deviation present. No thyromegaly present.  Cardiovascular: Normal rate, regular rhythm, S1 normal, S2 normal and normal heart sounds.   No murmur heard. Pulmonary/Chest: Effort normal. No stridor. No tachypnea. No respiratory distress. He has no wheezes. He has no rales. He  exhibits no tenderness.  Abdominal: Soft. He exhibits no distension and no mass. There is no hepatosplenomegaly. There is no tenderness. There is no rebound and no guarding.  Musculoskeletal: He exhibits no edema or tenderness.  Lymphadenopathy:       Head (right side): No tonsillar adenopathy present.       Head (left side): No tonsillar adenopathy present.    He has no cervical adenopathy.    He has no axillary adenopathy.  Neurological: He is alert. Gait normal.  Skin: No rash noted. He is not diaphoretic. No erythema. No pallor. Nails show no clubbing.  Psychiatric: Mood and affect normal.    Diagnostics:  Review blood test results obtained 05/01/2016 identified normal hepatic and renal function, a hemoglobin of 15.3 with a platelet count 296 and a white blood cell count of 10.3.   Assessment and Plan:    1. Allergic reaction, initial encounter   2. Other allergic rhinitis   3. Frequent epistaxis     1. Preventative therapy:   A. montelukast 10 mg tablet 1 time a day  B. cetirizine 10 mg tablet 1 time a day  C. ranitidine 300 mg tablet 1 time a day  2. EpiPen, Benadryl, M.D./ER for allergic reaction  3. Blood - alpha gal panel, CBC with differential, TSH, T4, TP, sed, citrus panel, C4  4. Further evaluation?  5. For nasal issue, use saline followed by bactroban ointment three times a day for the next month.  6. Contact clinic in 2 weeks or earlier if problem   I do not know why Braxtynn is having an issue with recurrent swelling of his uvula and throat but we'll have him obtain the blood tests noted above and have him use a preventative plan as noted above and see him back in this clinic in 2 weeks to consider further evaluation and treatment pending the results of his test and his response to treatment. As well, he does appear to have a significant septal inflammation and we'll try to get that under better control by using Bactroban for the next month.   Alexis Prows,  MD [facility]

## 2016-08-27 ENCOUNTER — Telehealth: Payer: Self-pay | Admitting: Allergy and Immunology

## 2016-08-27 DIAGNOSIS — M545 Low back pain: Secondary | ICD-10-CM | POA: Diagnosis not present

## 2016-08-27 DIAGNOSIS — M542 Cervicalgia: Secondary | ICD-10-CM | POA: Diagnosis not present

## 2016-08-27 DIAGNOSIS — M9903 Segmental and somatic dysfunction of lumbar region: Secondary | ICD-10-CM | POA: Diagnosis not present

## 2016-08-27 DIAGNOSIS — M9901 Segmental and somatic dysfunction of cervical region: Secondary | ICD-10-CM | POA: Diagnosis not present

## 2016-08-27 NOTE — Telephone Encounter (Signed)
Patient saw Dr. Neldon Mc on Tuesday and he wanted to know if his results were back.

## 2016-08-27 NOTE — Telephone Encounter (Signed)
Please advise on lab results.

## 2016-08-28 LAB — SPECIMEN STATUS REPORT

## 2016-08-28 LAB — C4 COMPLEMENT: Complement C4, Serum: 21 mg/dL (ref 14–44)

## 2016-08-28 NOTE — Telephone Encounter (Signed)
Advised pt of lab results.

## 2016-08-29 LAB — CBC WITH DIFFERENTIAL/PLATELET
BASOS: 0 %
Basophils Absolute: 0 10*3/uL (ref 0.0–0.2)
EOS (ABSOLUTE): 0.2 10*3/uL (ref 0.0–0.4)
EOS: 3 %
Hematocrit: 42 % (ref 37.5–51.0)
Hemoglobin: 14 g/dL (ref 12.6–17.7)
IMMATURE GRANS (ABS): 0.1 10*3/uL (ref 0.0–0.1)
IMMATURE GRANULOCYTES: 1 %
LYMPHS: 30 %
Lymphocytes Absolute: 2.6 10*3/uL (ref 0.7–3.1)
MCH: 26.3 pg — ABNORMAL LOW (ref 26.6–33.0)
MCHC: 33.3 g/dL (ref 31.5–35.7)
MCV: 79 fL (ref 79–97)
MONOS ABS: 0.6 10*3/uL (ref 0.1–0.9)
Monocytes: 7 %
NEUTROS PCT: 59 %
Neutrophils Absolute: 5.4 10*3/uL (ref 1.4–7.0)
PLATELETS: 268 10*3/uL (ref 150–379)
RBC: 5.32 x10E6/uL (ref 4.14–5.80)
RDW: 14.6 % (ref 12.3–15.4)
WBC: 8.9 10*3/uL (ref 3.4–10.8)

## 2016-08-29 LAB — ALPHA-GAL PANEL
Alpha Gal IgE*: <0.1 kU/L (ref <0.35)
BEEF CLASS INTERPRETATION: 0
Class Interpretation: 0
Lamb/Mutton (Ovis spp) IgE: <0.1 kU/L (ref <0.35)
PORK CLASS INTERPRETATION: 0

## 2016-08-29 LAB — ALLERGEN PROFILE, FOOD-CITRUS
Allergen Grapefruit IgE: <0.1 kU/L
Lemon: <0.1 kU/L
Tangerine IgE: <0.1 kU/L

## 2016-08-29 LAB — SEDIMENTATION RATE: SED RATE: 26 mm/h — AB (ref 0–15)

## 2016-08-29 LAB — T4 AND TSH
T4 TOTAL: 7.9 ug/dL (ref 4.5–12.0)
TSH: 1.97 u[IU]/mL (ref 0.450–4.500)

## 2016-08-29 LAB — THYROID PEROXIDASE ANTIBODY: THYROID PEROXIDASE ANTIBODY: 8 [IU]/mL (ref 0–34)

## 2016-09-07 ENCOUNTER — Telehealth: Payer: Self-pay | Admitting: Allergy and Immunology

## 2016-09-07 NOTE — Telephone Encounter (Signed)
Patient said someone called him today, 09-07-16, he does not know why because he already has his test results. He was just returning the call.

## 2016-09-08 ENCOUNTER — Ambulatory Visit: Payer: BLUE CROSS/BLUE SHIELD | Admitting: Allergy and Immunology

## 2016-09-08 DIAGNOSIS — J069 Acute upper respiratory infection, unspecified: Secondary | ICD-10-CM | POA: Diagnosis not present

## 2016-09-08 NOTE — Telephone Encounter (Signed)
Called patient advise to disregard previous call it was for results but pt has been given results

## 2016-09-17 ENCOUNTER — Other Ambulatory Visit (INDEPENDENT_AMBULATORY_CARE_PROVIDER_SITE_OTHER): Payer: BLUE CROSS/BLUE SHIELD

## 2016-09-17 ENCOUNTER — Ambulatory Visit (INDEPENDENT_AMBULATORY_CARE_PROVIDER_SITE_OTHER): Payer: BLUE CROSS/BLUE SHIELD | Admitting: Internal Medicine

## 2016-09-17 VITALS — BP 144/100 | HR 95 | Temp 98.4°F | Resp 16 | Ht 75.0 in | Wt 394.1 lb

## 2016-09-17 DIAGNOSIS — I10 Essential (primary) hypertension: Secondary | ICD-10-CM

## 2016-09-17 DIAGNOSIS — E785 Hyperlipidemia, unspecified: Secondary | ICD-10-CM

## 2016-09-17 DIAGNOSIS — F411 Generalized anxiety disorder: Secondary | ICD-10-CM

## 2016-09-17 LAB — URINALYSIS, ROUTINE W REFLEX MICROSCOPIC
Bilirubin Urine: NEGATIVE
Hgb urine dipstick: NEGATIVE
Ketones, ur: NEGATIVE
Leukocytes, UA: NEGATIVE
NITRITE: NEGATIVE
RBC / HPF: NONE SEEN (ref 0–?)
SPECIFIC GRAVITY, URINE: 1.02 (ref 1.000–1.030)
TOTAL PROTEIN, URINE-UPE24: NEGATIVE
URINE GLUCOSE: NEGATIVE
Urobilinogen, UA: 0.2 (ref 0.0–1.0)
WBC, UA: NONE SEEN (ref 0–?)
pH: 6 (ref 5.0–8.0)

## 2016-09-17 MED ORDER — TELMISARTAN-AMLODIPINE 80-10 MG PO TABS: 1.0000 | ORAL_TABLET | Freq: Every day | ORAL | 1 refills | Status: DC

## 2016-09-17 NOTE — Progress Notes (Signed)
Subjective:  Patient ID: Alexis [name], male    DOB: 1986-09-15  Age: 30 y.o. MRN: [id number]  CC: Hypertension and Hyperlipidemia   HPI Alexis [name] presents for Concerns that his blood pressure is not well controlled. Over the last month he has been able to lose 10 pounds with diet and exercise. He tells me his blood pressure has not been well controlled. Fortunately, he has had no episodes of headache/blurred vision/chest pain/shortness of breath/palpitations/edema/fatigue. He is taking losartan and verapamil and is not experiencing any side effects. He had a low calcium level on the last set of labs that were done about 4 months ago. He is also due for an annual lipids test.  Outpatient Medications Prior to Visit  Medication Sig Dispense Refill  . ALPRAZolam (XANAX) 1 MG tablet Take 1 mg by mouth 2 (two) times daily as needed for anxiety.    Marland Kitchen buPROPion (WELLBUTRIN) 75 MG tablet Take 1 tablet (75 mg total) by mouth daily. 90 tablet 3  . montelukast (SINGULAIR) 10 MG tablet Take 1 tablet (10 mg total) by mouth at bedtime. 30 tablet 5  . omeprazole (PRILOSEC) 40 MG capsule TAKE 1 CAPSULE BY MOUTH TWICE A DAY 180 capsule 3  . ranitidine (ZANTAC) 300 MG tablet Take 1 tablet (300 mg total) by mouth at bedtime. 30 tablet 5  . topiramate (TOPAMAX) 50 MG tablet TAKE 1 TABLET (50 MG TOTAL) BY MOUTH 2 (TWO) TIMES DAILY. (Patient taking differently: Take 100 mg by mouth daily. ) 60 tablet 5  . losartan (COZAAR) 100 MG tablet TAKE 1 TABLET (100 MG TOTAL) BY MOUTH DAILY. 90 tablet 2  . losartan (COZAAR) 100 MG tablet Take 1 tablet by mouth daily.    . mupirocin ointment (BACTROBAN) 2 % Place 1 application into the nose 2 (two) times daily. 22 g 0  . verapamil (CALAN-SR) 120 MG CR tablet TAKE ONE CAPSULE BY MOUTH EVERY DAY 90 tablet 2   No facility-administered medications prior to visit.     ROS Review of Systems  Constitutional: Negative for activity change, appetite change, diaphoresis, fatigue  and unexpected weight change.  HENT: Negative.   Eyes: Negative for visual disturbance.  Respiratory: Negative for apnea, cough, chest tightness, shortness of breath, wheezing and stridor.   Cardiovascular: Negative for chest pain, palpitations and leg swelling.  Gastrointestinal: Negative for abdominal pain, constipation, diarrhea, nausea and vomiting.  Endocrine: Negative.   Genitourinary: Negative.  Negative for difficulty urinating, dysuria, frequency and urgency.  Musculoskeletal: Negative.  Negative for back pain and neck pain.  Allergic/Immunologic: Negative.   Neurological: Negative.  Negative for dizziness, seizures, weakness, light-headedness, numbness and headaches.  Hematological: Negative.  Negative for adenopathy. Does not bruise/bleed easily.  Psychiatric/Behavioral: Negative.  Negative for agitation, decreased concentration, dysphoric mood and sleep disturbance. The patient is not nervous/anxious.     Objective:  BP (!) 144/100 (BP Location: Left Arm, Patient Position: Sitting, Cuff Size: Large)   Pulse 95   Temp 98.4 F (36.9 C) (Oral)   Resp 16   Ht 6\' 3"  (1.905 m)   Wt (!) 394 lb 2 oz (178.8 kg)   SpO2 98%   BMI 49.26 kg/m   BP Readings from Last 3 Encounters:  09/17/16 (!) 144/100  08/25/16 (!) 138/92  07/23/16 (!) 127/93    Wt Readings from Last 3 Encounters:  09/17/16 (!) 394 lb 2 oz (178.8 kg)  08/25/16 (!) 404 lb (183.3 kg)  06/29/16 (!) 400 lb (181.4 kg)  Physical Exam  Constitutional: He is oriented to person, place, and time. No distress.  HENT:  Mouth/Throat: Oropharynx is clear and moist. No oropharyngeal exudate.  Eyes: Conjunctivae are normal. Right eye exhibits no discharge. Left eye exhibits no discharge. No scleral icterus.  Neck: Normal range of motion. Neck supple. No JVD present. No tracheal deviation present. No thyromegaly present.  Cardiovascular: Normal rate, regular rhythm, normal heart sounds and intact distal pulses.  Exam  reveals no gallop and no friction rub.   No murmur heard. Pulmonary/Chest: Effort normal and breath sounds normal. No stridor. No respiratory distress. He has no wheezes. He has no rales. He exhibits no tenderness.  Abdominal: Soft. Bowel sounds are normal. He exhibits no distension and no mass. There is no tenderness. There is no rebound and no guarding.  Musculoskeletal: Normal range of motion. He exhibits no edema, tenderness or deformity.  Lymphadenopathy:    He has no cervical adenopathy.  Neurological: He is oriented to person, place, and time.  Skin: Skin is warm and dry. No rash noted. He is not diaphoretic. No erythema. No pallor.  Vitals reviewed.   Lab Results  Component Value Date   WBC 8.9 08/25/2016   HGB 15.3 05/01/2016   HCT 42.0 08/25/2016   PLT 268 08/25/2016   GLUCOSE 89 09/17/2016   CHOL 129 09/17/2016   TRIG 92.0 09/17/2016   HDL 29.90 (L) 09/17/2016   LDLCALC 80 09/17/2016   ALT 32 09/17/2016   AST 25 09/17/2016   NA 139 09/17/2016   K 3.8 09/17/2016   CL 106 09/17/2016   CREATININE 1.11 09/17/2016   BUN 10 09/17/2016   CO2 27 09/17/2016   TSH 1.970 08/25/2016   HGBA1C 5.5 03/08/2007    Ir Fluoro Guide Ndl Plmt / Bx  Result Date: 07/23/2016 CLINICAL DATA:  Positional headache post lumbar puncture 07/17/2016 EXAM: LUMBAR EPIDUROGRAM AND BLOOD PATCH FLUOROSCOPY TIME:  0.6 minute, 229 uGym2 DAP COMPARISON:  07/17/2016 TECHNIQUE: The procedure, risks (including but not limited to bleeding, infection, organ damage ), benefits, and alternatives were explained to the patient. Questions regarding the procedure were encouraged and answered. The patient understands and consents to the procedure. Under fluoroscopy, an appropriate skin entry site was localized corresponding to the level of previous lumbar puncture. An appropriate skin entry site was determined. Operator donned sterile gloves and mask. Skin site was marked, prepped with Betadine, and draped in usual  sterile fashion, and infiltrated locally with 1% lidocaine. A long 20 gauge Crawford needle was advanced into the lumbar posterior epidural space near the midline using a right interlaminar approach at L4-5using loss of resistance technique. Diagnostic injection of 54ml Isovue-M 200 demonstrates good epidural spread above and below the needle tip and crossing the midline. No intravascular or subarachnoid component. 9ml of autologous blood were then administered as lumbar epidural blood patch. No immediate complication. IMPRESSION: 1. Technically successful lumbar epidural blood patch under fluoroscopy. The patient was counseled on the importance of laying flat for an additional 24 hours and maintaining good p.o. fluid intake. Electronically Signed   By: Lucrezia Europe M.D.   On: 07/23/2016 15:11    Assessment & Plan:   Renly was seen today for hypertension and hyperlipidemia.  Diagnoses and all orders for this visit:  Hyperlipidemia LDL goal <130- His Framingham risk was less than 1% so I do not recommend that he start taking a statin. -     Lipid panel; Future  GAD (generalized anxiety disorder)  Essential  hypertension- his labs are negative for any end organ damage or secondary causes of hypertension, I did praise him for his lifestyle modifications. With his current regimen he is not achieving blood pressure control so I've changed him to a more potent ARB and CCB for better control. -     Telmisartan-Amlodipine 80-10 MG TABS; Take 1 tablet by mouth daily. -     Urinalysis, Routine w reflex microscopic; Future  Hypocalcemia- his calcium level is normal now, I will screen for hypoparathyroidism with a PTH level. -     Comprehensive metabolic panel; Future -     Magnesium; Future -     Phosphorus; Future -     PTH, intact and calcium; Future   I have discontinued Mr. Nored's verapamil, losartan, losartan, mupirocin ointment, benzonatate, predniSONE, and diphenhydrAMINE. I am also having him  start on Telmisartan-Amlodipine. Additionally, I am having him maintain his omeprazole, buPROPion, ALPRAZolam, topiramate, montelukast, ranitidine, PROAIR HFA, ipratropium, CETIRIZINE HCL PO, and EPINEPHrine.  Meds ordered this encounter  Medications  . PROAIR HFA 108 (90 Base) MCG/ACT inhaler    Sig: INHALE 1-2 PUFFS EVERY 4-6 HOURS AS NEEDED FOR PERSISTENT COUGH OR INCREASED WORK OF BREATHING    Refill:  0  . DISCONTD: benzonatate (TESSALON) 200 MG capsule    Sig: TAKE ONE CAPSULE BY MOUTH 3 TIMES A DAY AS NEEDED FOR COUGH    Refill:  0  . ipratropium (ATROVENT) 0.06 % nasal spray    Sig: SPRAY 2 PUFFS IN THE NOSTRILS THREE TIMES A DAY AS NEEDED FOR NASAL CONGESTION AND DRAINAGE    Refill:  0  . DISCONTD: predniSONE (DELTASONE) 10 MG tablet    Sig: DAY 1 TAKE 5 TABLETS. DAY 2 TAKE 4 TABS, DAY 3 TAKE 3 TABS, DAY 4 TAKE 2 TABS AND DAY 5 TAKE 1 TABLE    Refill:  0  . CETIRIZINE HCL PO    Sig: Take by mouth.  . DISCONTD: diphenhydrAMINE (BENADRYL) 25 MG tablet    Sig: Take 25 mg by mouth every 6 (six) hours as needed.  Marland Kitchen EPINEPHrine (EPIPEN 2-PAK) 0.3 mg/0.3 mL IJ SOAJ injection    Sig: Inject into the muscle once.  . Telmisartan-Amlodipine 80-10 MG TABS    Sig: Take 1 tablet by mouth daily.    Dispense:  90 tablet    Refill:  1     Follow-up: Return in about 6 weeks (around 10/29/2016).  Scarlette Calico, MD

## 2016-09-17 NOTE — Patient Instructions (Signed)
Hypertension Hypertension, commonly called high blood pressure, is when the force of blood pumping through your arteries is too strong. Your arteries are the blood vessels that carry blood from your heart throughout your body. A blood pressure reading consists of a higher number over a lower number, such as 110/72. The higher number (systolic) is the pressure inside your arteries when your heart pumps. The lower number (diastolic) is the pressure inside your arteries when your heart relaxes. Ideally you want your blood pressure below 120/80. Hypertension forces your heart to work harder to pump blood. Your arteries may become narrow or stiff. Having untreated or uncontrolled hypertension can cause heart attack, stroke, kidney disease, and other problems. What increases the risk? Some risk factors for high blood pressure are controllable. Others are not. Risk factors you cannot control include:  Race. You may be at higher risk if you are African American.  Age. Risk increases with age.  Gender. Men are at higher risk than women before age 45 years. After age 65, women are at higher risk than men. Risk factors you can control include:  Not getting enough exercise or physical activity.  Being overweight.  Getting too much fat, sugar, calories, or salt in your diet.  Drinking too much alcohol. What are the signs or symptoms? Hypertension does not usually cause signs or symptoms. Extremely high blood pressure (hypertensive crisis) may cause headache, anxiety, shortness of breath, and nosebleed. How is this diagnosed? To check if you have hypertension, your health care provider will measure your blood pressure while you are seated, with your arm held at the level of your heart. It should be measured at least twice using the same arm. Certain conditions can cause a difference in blood pressure between your right and left arms. A blood pressure reading that is higher than normal on one occasion does  not mean that you need treatment. If it is not clear whether you have high blood pressure, you may be asked to return on a different day to have your blood pressure checked again. Or, you may be asked to monitor your blood pressure at home for 1 or more weeks. How is this treated? Treating high blood pressure includes making lifestyle changes and possibly taking medicine. Living a healthy lifestyle can help lower high blood pressure. You may need to change some of your habits. Lifestyle changes may include:  Following the DASH diet. This diet is high in fruits, vegetables, and whole grains. It is low in salt, red meat, and added sugars.  Keep your sodium intake below 2,300 mg per day.  Getting at least 30-45 minutes of aerobic exercise at least 4 times per week.  Losing weight if necessary.  Not smoking.  Limiting alcoholic beverages.  Learning ways to reduce stress. Your health care provider may prescribe medicine if lifestyle changes are not enough to get your blood pressure under control, and if one of the following is true:  You are 18-59 years of age and your systolic blood pressure is above 140.  You are 60 years of age or older, and your systolic blood pressure is above 150.  Your diastolic blood pressure is above 90.  You have diabetes, and your systolic blood pressure is over 140 or your diastolic blood pressure is over 90.  You have kidney disease and your blood pressure is above 140/90.  You have heart disease and your blood pressure is above 140/90. Your personal target blood pressure may vary depending on your medical   conditions, your age, and other factors. Follow these instructions at home:  Have your blood pressure rechecked as directed by your health care provider.  Take medicines only as directed by your health care provider. Follow the directions carefully. Blood pressure medicines must be taken as prescribed. The medicine does not work as well when you skip  doses. Skipping doses also puts you at risk for problems.  Do not smoke.  Monitor your blood pressure at home as directed by your health care provider. Contact a health care provider if:  You think you are having a reaction to medicines taken.  You have recurrent headaches or feel dizzy.  You have swelling in your ankles.  You have trouble with your vision. Get help right away if:  You develop a severe headache or confusion.  You have unusual weakness, numbness, or feel faint.  You have severe chest or abdominal pain.  You vomit repeatedly.  You have trouble breathing. This information is not intended to replace advice given to you by your health care provider. Make sure you discuss any questions you have with your health care provider. Document Released: 09/28/2005 Document Revised: 03/05/2016 Document Reviewed: 07/21/2013 Elsevier Interactive Patient Education  2017 [employer]  

## 2016-09-17 NOTE — Progress Notes (Signed)
Pre visit review using our clinic review tool, if applicable. No additional management support is needed unless otherwise documented below in the visit note. 

## 2016-09-18 ENCOUNTER — Encounter: Payer: Self-pay | Admitting: Internal Medicine

## 2016-09-18 LAB — LIPID PANEL
CHOL/HDL RATIO: 4
Cholesterol: 129 mg/dL (ref 0–200)
HDL: 29.9 mg/dL — AB (ref >39.00)
LDL Cholesterol: 80 mg/dL (ref 0–99)
NONHDL: 98.88
TRIGLYCERIDES: 92 mg/dL (ref 0.0–149.0)
VLDL: 18.4 mg/dL (ref 0.0–40.0)

## 2016-09-18 LAB — COMPREHENSIVE METABOLIC PANEL
ALK PHOS: 59 U/L (ref 39–117)
ALT: 32 U/L (ref 0–53)
AST: 25 U/L (ref 0–37)
Albumin: 3.8 g/dL (ref 3.5–5.2)
BILIRUBIN TOTAL: 0.5 mg/dL (ref 0.2–1.2)
BUN: 10 mg/dL (ref 6–23)
CO2: 27 meq/L (ref 19–32)
CREATININE: 1.11 mg/dL (ref 0.40–1.50)
Calcium: 9 mg/dL (ref 8.4–10.5)
Chloride: 106 mEq/L (ref 96–112)
GFR: 82.43 mL/min (ref >60.00)
GLUCOSE: 89 mg/dL (ref 70–99)
Potassium: 3.8 mEq/L (ref 3.5–5.1)
Sodium: 139 mEq/L (ref 135–145)
TOTAL PROTEIN: 7.5 g/dL (ref 6.0–8.3)

## 2016-09-18 LAB — MAGNESIUM: Magnesium: 2.2 mg/dL (ref 1.5–2.5)

## 2016-09-18 LAB — PHOSPHORUS: Phosphorus: 4.1 mg/dL (ref 2.3–4.6)

## 2016-09-20 ENCOUNTER — Encounter: Payer: Self-pay | Admitting: Internal Medicine

## 2016-09-21 DIAGNOSIS — R490 Dysphonia: Secondary | ICD-10-CM | POA: Diagnosis not present

## 2016-09-21 DIAGNOSIS — J322 Chronic ethmoidal sinusitis: Secondary | ICD-10-CM | POA: Diagnosis not present

## 2016-09-21 DIAGNOSIS — J370 Chronic laryngitis: Secondary | ICD-10-CM | POA: Diagnosis not present

## 2016-09-21 DIAGNOSIS — J320 Chronic maxillary sinusitis: Secondary | ICD-10-CM | POA: Diagnosis not present

## 2016-09-22 LAB — PTH, INTACT AND CALCIUM

## 2016-10-12 HISTORY — PX: LASIK: SHX215

## 2016-10-28 ENCOUNTER — Other Ambulatory Visit: Payer: Self-pay | Admitting: Internal Medicine

## 2016-11-03 ENCOUNTER — Encounter: Payer: Self-pay | Admitting: Internal Medicine

## 2016-12-03 IMAGING — CR DG CHEST 2V
2 series · 2 of 2 positions shown · non-contrast
Comparison: July 06, 2014

CLINICAL DATA: Cough for 3 weeks.  Intermittent chest pain

EXAM:
CHEST  2 VIEW

[w chest pa]
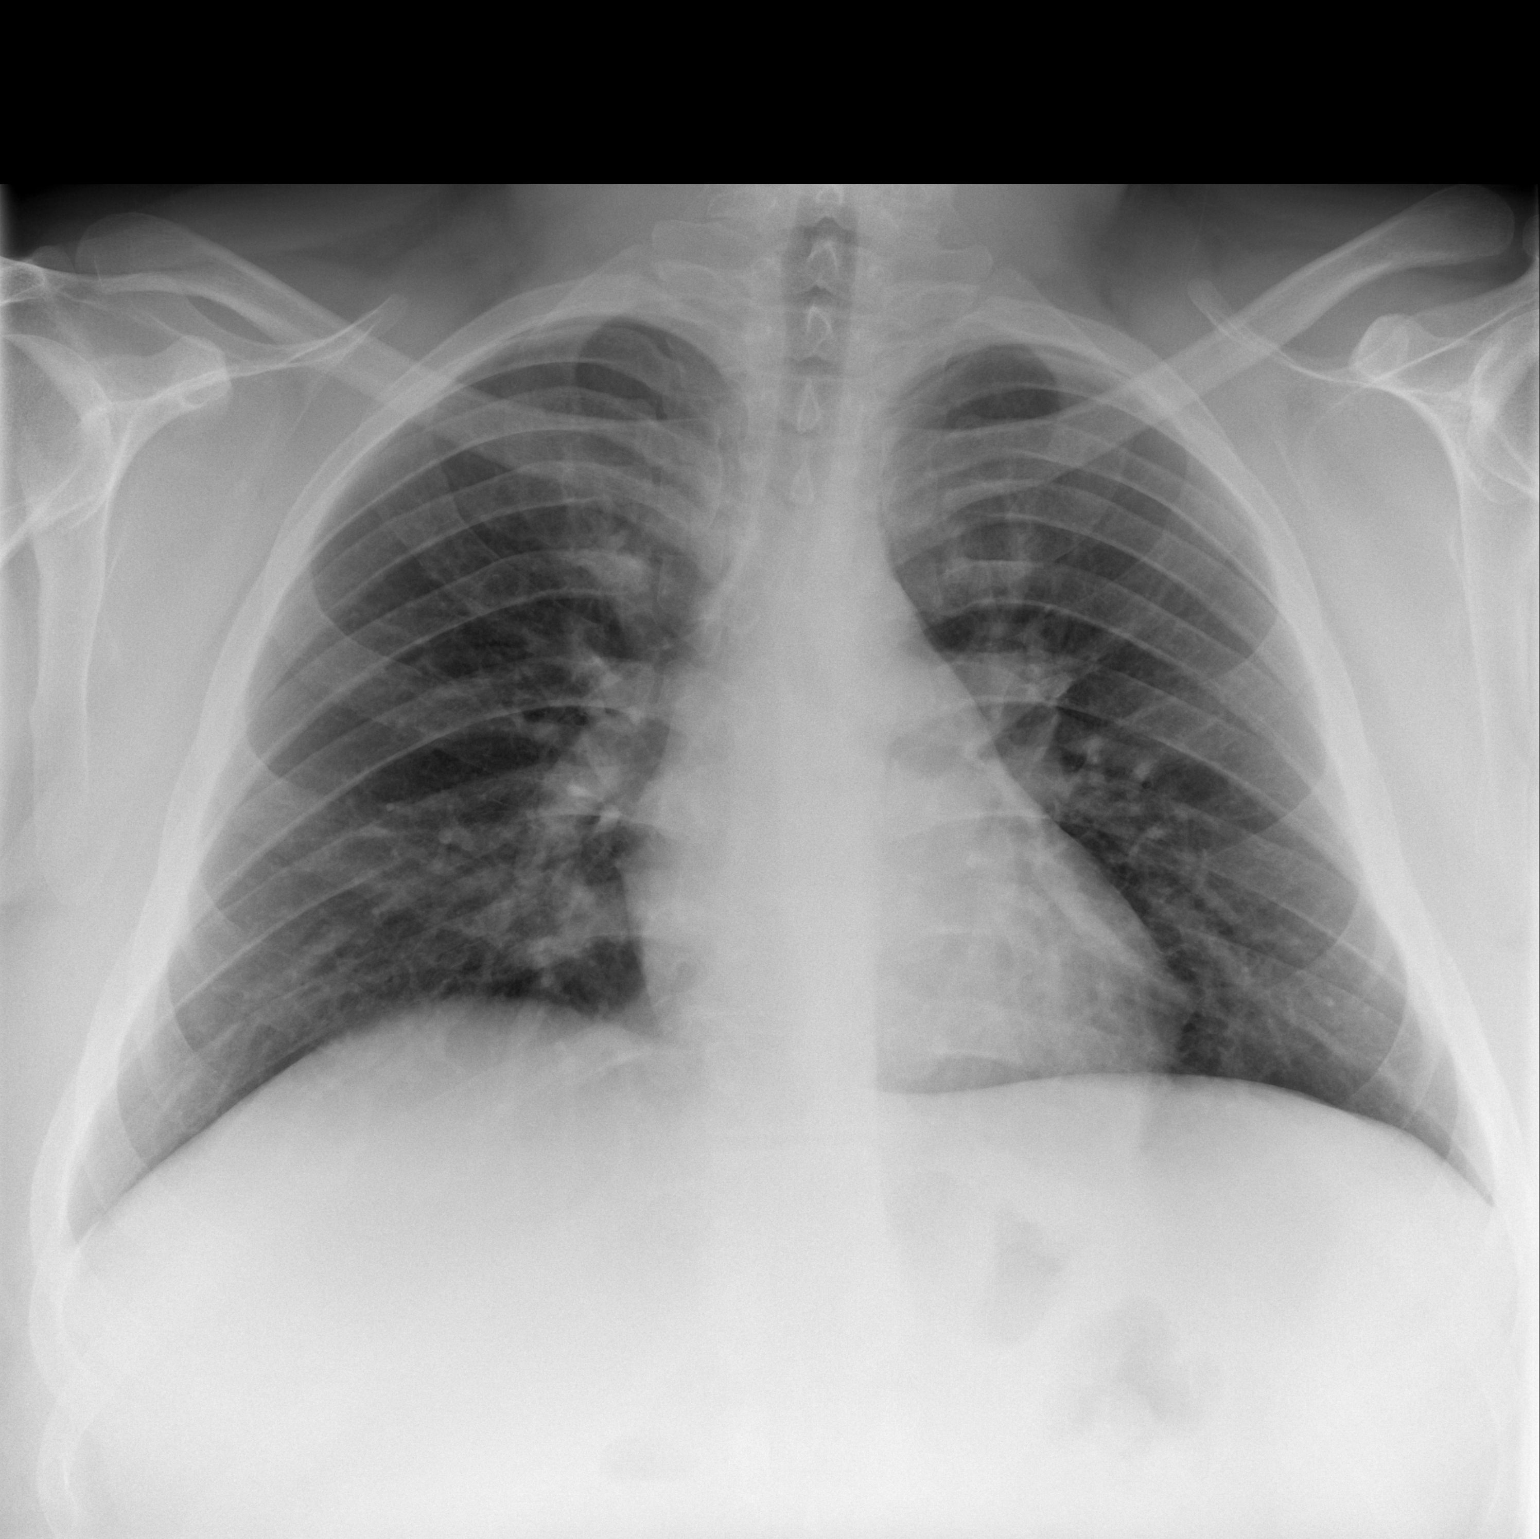

[w chest lat]
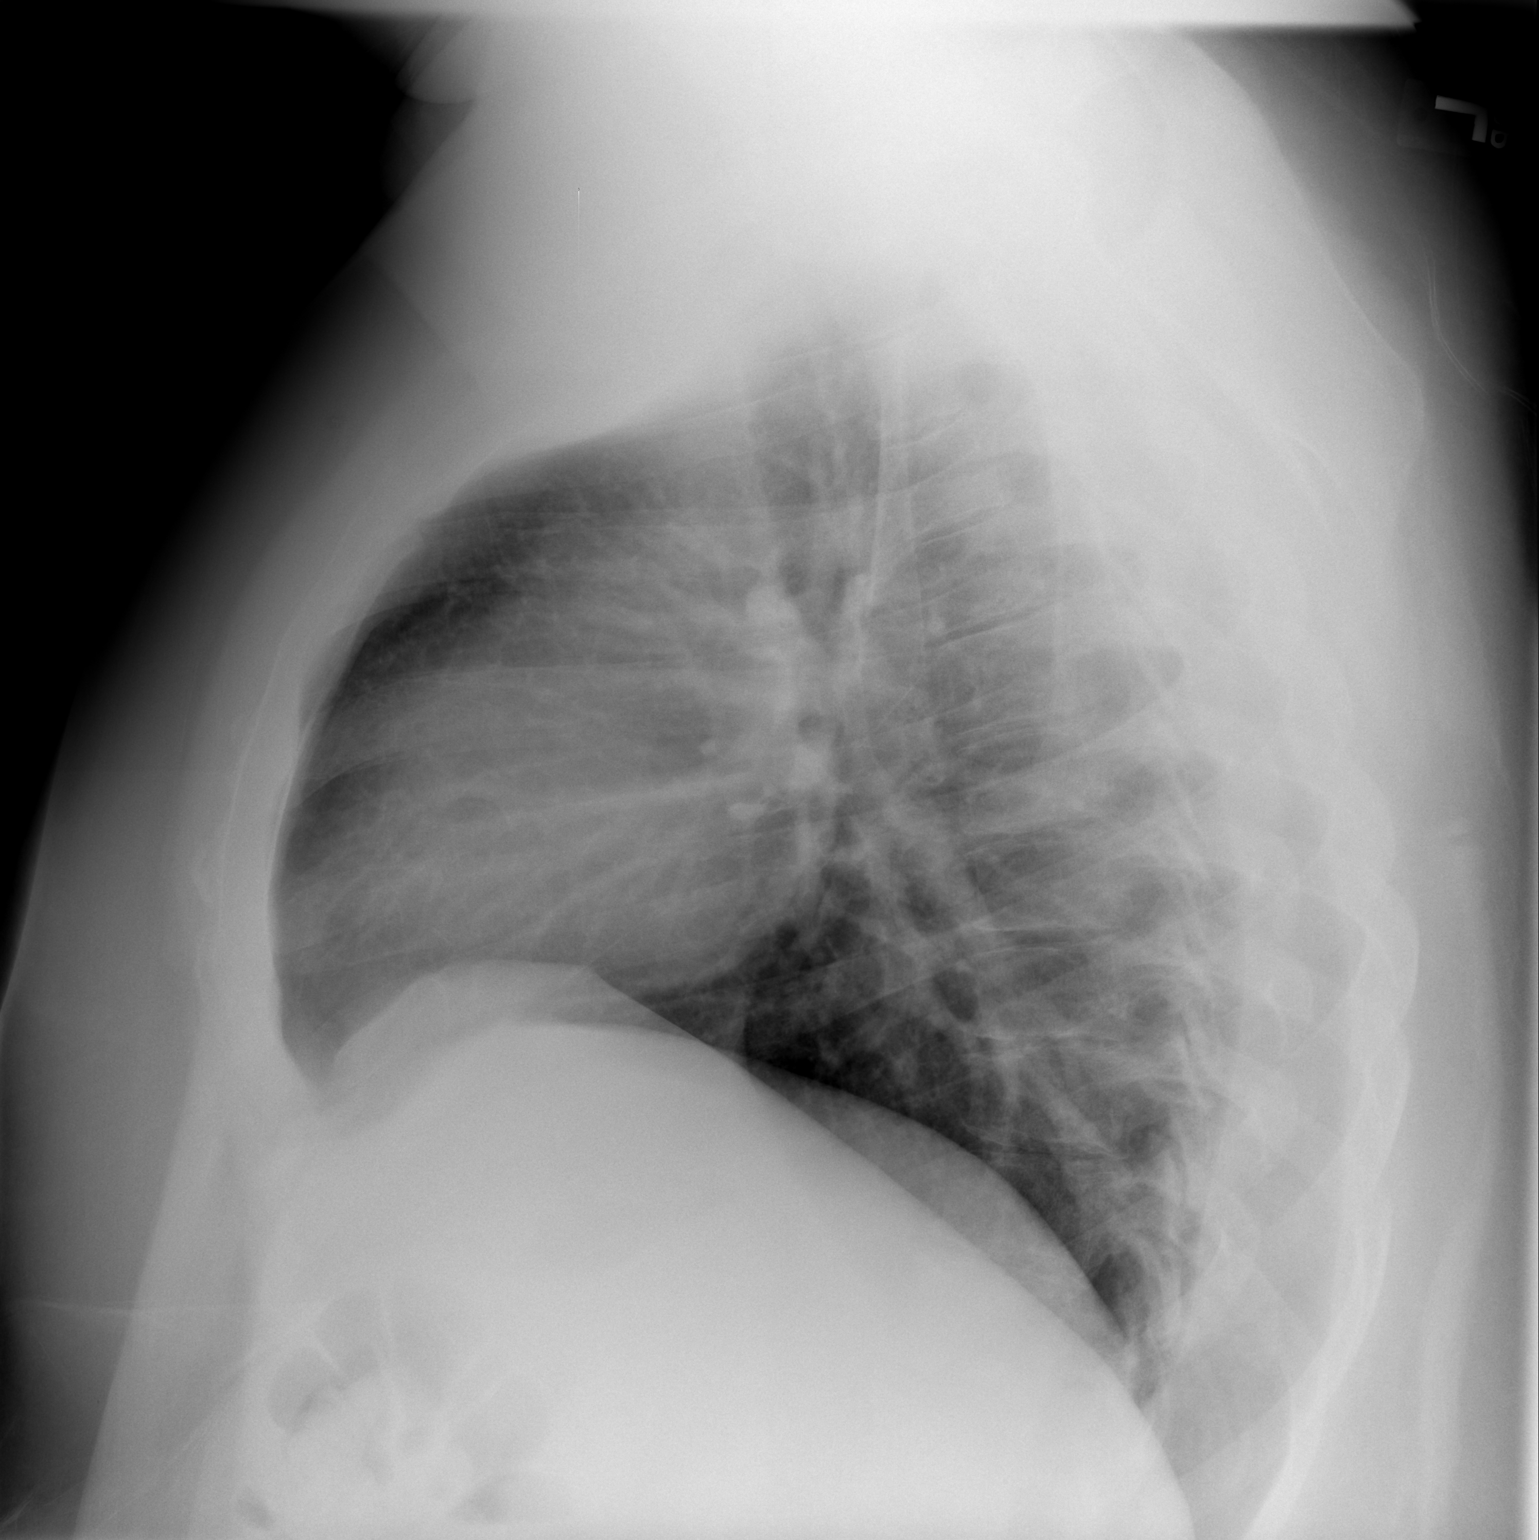

[2 of 2 positions shown; findings below may reference images not displayed]

FINDINGS: Lungs are clear. Heart size and pulmonary vascularity are normal. No
adenopathy. No pneumothorax. No bone lesions.
IMPRESSION: No edema or consolidation.

## 2016-12-14 ENCOUNTER — Encounter: Payer: Self-pay | Admitting: Internal Medicine

## 2016-12-14 ENCOUNTER — Ambulatory Visit (INDEPENDENT_AMBULATORY_CARE_PROVIDER_SITE_OTHER): Payer: BLUE CROSS/BLUE SHIELD | Admitting: Internal Medicine

## 2016-12-14 VITALS — BP 136/82 | HR 95 | Temp 98.5°F | Resp 16 | Ht 75.0 in | Wt 375.5 lb

## 2016-12-14 DIAGNOSIS — I10 Essential (primary) hypertension: Secondary | ICD-10-CM | POA: Diagnosis not present

## 2016-12-14 MED ORDER — LOSARTAN POTASSIUM 100 MG PO TABS: ORAL_TABLET | ORAL | 1 refills | Status: DC

## 2016-12-14 NOTE — Progress Notes (Signed)
Subjective:  Patient ID: Alexis [name], male    DOB: Apr 15, 1986  Age: 31 y.o. MRN: [id number]  CC: Follow-up (htn, STABLE) and Hypertension   HPI Alexis [name] presents for a blood pressure check. When I last saw him I had asked him to upgrade to telmisartan & amlodipine but he decided not to do that. Instead he has been trying to lower his blood pressure with lifestyle modifications including diet and exercise. He has lost about 20 pounds since I last saw him. He is taking losartan to control his blood pressure. He feels well today and offers no complaints.  Outpatient Medications Prior to Visit  Medication Sig Dispense Refill  . ALPRAZolam (XANAX) 1 MG tablet Take 1 mg by mouth 2 (two) times daily as needed for anxiety.    Marland Kitchen buPROPion (WELLBUTRIN) 75 MG tablet TAKE 1 TABLET BY MOUTH EVERY DAY 90 tablet 3  . CETIRIZINE HCL PO Take by mouth.    . EPINEPHrine (EPIPEN 2-PAK) 0.3 mg/0.3 mL IJ SOAJ injection Inject into the muscle once.    Marland Kitchen ipratropium (ATROVENT) 0.06 % nasal spray SPRAY 2 PUFFS IN THE NOSTRILS THREE TIMES A DAY AS NEEDED FOR NASAL CONGESTION AND DRAINAGE  0  . montelukast (SINGULAIR) 10 MG tablet Take 1 tablet (10 mg total) by mouth at bedtime. 30 tablet 5  . omeprazole (PRILOSEC) 40 MG capsule TAKE 1 CAPSULE BY MOUTH TWICE A DAY 180 capsule 3  . PROAIR HFA 108 (90 Base) MCG/ACT inhaler INHALE 1-2 PUFFS EVERY 4-6 HOURS AS NEEDED FOR PERSISTENT COUGH OR INCREASED WORK OF BREATHING  0  . ranitidine (ZANTAC) 300 MG tablet Take 1 tablet (300 mg total) by mouth at bedtime. (Patient taking differently: Take 300 mg by mouth at bedtime as needed. ) 30 tablet 5  . topiramate (TOPAMAX) 50 MG tablet TAKE 1 TABLET (50 MG TOTAL) BY MOUTH 2 (TWO) TIMES DAILY. (Patient taking differently: Take 100 mg by mouth daily. ) 60 tablet 5  . Telmisartan-Amlodipine 80-10 MG TABS Take 1 tablet by mouth daily. (Patient not taking: Reported on 12/14/2016) 90 tablet 1   No facility-administered medications  prior to visit.     ROS Review of Systems  Constitutional: Negative.  Negative for appetite change, diaphoresis, fatigue and unexpected weight change.  HENT: Negative.   Eyes: Negative.  Negative for visual disturbance.  Respiratory: Negative for cough, chest tightness, shortness of breath, wheezing and stridor.   Cardiovascular: Negative for chest pain, palpitations and leg swelling.  Gastrointestinal: Negative.  Negative for abdominal pain.  Endocrine: Negative.   Genitourinary: Negative.  Negative for difficulty urinating.  Musculoskeletal: Negative.  Negative for back pain and neck pain.  Skin: Negative.   Allergic/Immunologic: Negative.   Neurological: Negative.  Negative for dizziness, weakness, numbness and headaches.  Hematological: Negative.  Negative for adenopathy. Does not bruise/bleed easily.  Psychiatric/Behavioral: Negative.     Objective:  BP 136/82   Pulse 95   Temp 98.5 F (36.9 C) (Oral)   Resp 16   Ht 6\' 3"  (1.905 m)   Wt (!) 375 lb 8 oz (170.3 kg)   SpO2 98%   BMI 46.93 kg/m   BP Readings from Last 3 Encounters:  12/14/16 136/82  09/17/16 (!) 144/100  08/25/16 (!) 138/92    Wt Readings from Last 3 Encounters:  12/14/16 (!) 375 lb 8 oz (170.3 kg)  09/17/16 (!) 394 lb 2 oz (178.8 kg)  08/25/16 (!) 404 lb (183.3 kg)    Physical  Exam  Constitutional: He is oriented to person, place, and time. No distress.  HENT:  Mouth/Throat: Oropharynx is clear and moist. No oropharyngeal exudate.  Eyes: Conjunctivae are normal. Right eye exhibits no discharge. Left eye exhibits no discharge. No scleral icterus.  Neck: Normal range of motion. Neck supple. No JVD present. No tracheal deviation present. No thyromegaly present.  Cardiovascular: Normal rate, regular rhythm, normal heart sounds and intact distal pulses.  Exam reveals no gallop and no friction rub.   No murmur heard. Pulmonary/Chest: Effort normal and breath sounds normal. No stridor. No respiratory  distress. He has no wheezes. He has no rales. He exhibits no tenderness.  Abdominal: Soft. Bowel sounds are normal. He exhibits no distension and no mass. There is no tenderness. There is no rebound and no guarding.  Musculoskeletal: Normal range of motion. He exhibits no edema, tenderness or deformity.  Lymphadenopathy:    He has no cervical adenopathy.  Neurological: He is oriented to person, place, and time.  Skin: Skin is warm and dry. No rash noted. He is not diaphoretic. No erythema. No pallor.  Psychiatric: He has a normal mood and affect. His behavior is normal. Judgment and thought content normal.  Vitals reviewed.   Lab Results  Component Value Date   WBC 8.9 08/25/2016   HGB 15.3 05/01/2016   HCT 42.0 08/25/2016   PLT 268 08/25/2016   GLUCOSE 89 09/17/2016   CHOL 129 09/17/2016   TRIG 92.0 09/17/2016   HDL 29.90 (L) 09/17/2016   LDLCALC 80 09/17/2016   ALT 32 09/17/2016   AST 25 09/17/2016   NA 139 09/17/2016   K 3.8 09/17/2016   CL 106 09/17/2016   CREATININE 1.11 09/17/2016   BUN 10 09/17/2016   CO2 27 09/17/2016   TSH 1.970 08/25/2016   HGBA1C 5.5 03/08/2007    Ir Fluoro Guide Ndl Plmt / Bx  Result Date: 07/23/2016 CLINICAL DATA:  Positional headache post lumbar puncture 07/17/2016 EXAM: LUMBAR EPIDUROGRAM AND BLOOD PATCH FLUOROSCOPY TIME:  0.6 minute, 229 uGym2 DAP COMPARISON:  07/17/2016 TECHNIQUE: The procedure, risks (including but not limited to bleeding, infection, organ damage ), benefits, and alternatives were explained to the patient. Questions regarding the procedure were encouraged and answered. The patient understands and consents to the procedure. Under fluoroscopy, an appropriate skin entry site was localized corresponding to the level of previous lumbar puncture. An appropriate skin entry site was determined. Operator donned sterile gloves and mask. Skin site was marked, prepped with Betadine, and draped in usual sterile fashion, and infiltrated  locally with 1% lidocaine. A long 20 gauge Crawford needle was advanced into the lumbar posterior epidural space near the midline using a right interlaminar approach at L4-5using loss of resistance technique. Diagnostic injection of 66ml Isovue-M 200 demonstrates good epidural spread above and below the needle tip and crossing the midline. No intravascular or subarachnoid component. 45ml of autologous blood were then administered as lumbar epidural blood patch. No immediate complication. IMPRESSION: 1. Technically successful lumbar epidural blood patch under fluoroscopy. The patient was counseled on the importance of laying flat for an additional 24 hours and maintaining good p.o. fluid intake. Electronically Signed   By: Lucrezia Europe M.D.   On: 07/23/2016 15:11    Assessment & Plan:   Ameya was seen today for follow-up and hypertension.  Diagnoses and all orders for this visit:  Essential hypertension- His BP is adequately well controlled, will cont the ARB/CCB combination, he was praised for the weight loss -  losartan (COZAAR) 100 MG tablet; TAKE 1 TABLET (100 MG TOTAL) BY MOUTH DAILY.   I have discontinued Mr. Deady's Telmisartan-Amlodipine. I am also having him maintain his omeprazole, ALPRAZolam, topiramate, montelukast, ranitidine, PROAIR HFA, ipratropium, CETIRIZINE HCL PO, EPINEPHrine, buPROPion, verapamil, and losartan.  Meds ordered this encounter  Medications  . DISCONTD: losartan (COZAAR) 100 MG tablet    Sig: TAKE 1 TABLET (100 MG TOTAL) BY MOUTH DAILY.    Refill:  2  . verapamil (CALAN-SR) 120 MG CR tablet    Sig: Take by mouth daily.    Refill:  2  . losartan (COZAAR) 100 MG tablet    Sig: TAKE 1 TABLET (100 MG TOTAL) BY MOUTH DAILY.    Dispense:  90 tablet    Refill:  1     Follow-up: Return in about 6 months (around 06/16/2017).  Scarlette Calico, MD

## 2016-12-14 NOTE — Progress Notes (Signed)
Pre-visit discussion using our clinic review tool. No additional management support is needed unless otherwise documented below in the visit note.  

## 2016-12-14 NOTE — Patient Instructions (Signed)

## 2016-12-17 ENCOUNTER — Other Ambulatory Visit: Payer: Self-pay | Admitting: Internal Medicine

## 2016-12-17 DIAGNOSIS — K209 Esophagitis, unspecified without bleeding: Secondary | ICD-10-CM

## 2016-12-24 DIAGNOSIS — J342 Deviated nasal septum: Secondary | ICD-10-CM | POA: Diagnosis not present

## 2016-12-24 DIAGNOSIS — J039 Acute tonsillitis, unspecified: Secondary | ICD-10-CM | POA: Diagnosis not present

## 2016-12-24 DIAGNOSIS — J320 Chronic maxillary sinusitis: Secondary | ICD-10-CM | POA: Diagnosis not present

## 2016-12-28 DIAGNOSIS — S93401A Sprain of unspecified ligament of right ankle, initial encounter: Secondary | ICD-10-CM | POA: Diagnosis not present

## 2016-12-30 DIAGNOSIS — H5213 Myopia, bilateral: Secondary | ICD-10-CM | POA: Insufficient documentation

## 2016-12-31 ENCOUNTER — Other Ambulatory Visit: Payer: Self-pay | Admitting: Neurology

## 2017-01-21 DIAGNOSIS — S93491A Sprain of other ligament of right ankle, initial encounter: Secondary | ICD-10-CM | POA: Diagnosis not present

## 2017-01-28 ENCOUNTER — Other Ambulatory Visit: Payer: Self-pay | Admitting: Neurology

## 2017-02-04 DIAGNOSIS — S93401D Sprain of unspecified ligament of right ankle, subsequent encounter: Secondary | ICD-10-CM | POA: Diagnosis not present

## 2017-02-10 DIAGNOSIS — S93401D Sprain of unspecified ligament of right ankle, subsequent encounter: Secondary | ICD-10-CM | POA: Diagnosis not present

## 2017-02-23 DIAGNOSIS — J320 Chronic maxillary sinusitis: Secondary | ICD-10-CM | POA: Diagnosis not present

## 2017-02-23 DIAGNOSIS — J039 Acute tonsillitis, unspecified: Secondary | ICD-10-CM | POA: Diagnosis not present

## 2017-02-23 DIAGNOSIS — J322 Chronic ethmoidal sinusitis: Secondary | ICD-10-CM | POA: Diagnosis not present

## 2017-02-23 DIAGNOSIS — J029 Acute pharyngitis, unspecified: Secondary | ICD-10-CM | POA: Diagnosis not present

## 2017-03-13 ENCOUNTER — Other Ambulatory Visit: Payer: Self-pay | Admitting: Internal Medicine

## 2017-03-14 ENCOUNTER — Other Ambulatory Visit: Payer: Self-pay | Admitting: Internal Medicine

## 2017-03-14 DIAGNOSIS — I10 Essential (primary) hypertension: Secondary | ICD-10-CM

## 2017-03-29 DIAGNOSIS — J3501 Chronic tonsillitis: Secondary | ICD-10-CM | POA: Diagnosis not present

## 2017-05-06 DIAGNOSIS — M9903 Segmental and somatic dysfunction of lumbar region: Secondary | ICD-10-CM | POA: Diagnosis not present

## 2017-05-06 DIAGNOSIS — M545 Low back pain: Secondary | ICD-10-CM | POA: Diagnosis not present

## 2017-05-06 DIAGNOSIS — M542 Cervicalgia: Secondary | ICD-10-CM | POA: Diagnosis not present

## 2017-05-06 DIAGNOSIS — M9901 Segmental and somatic dysfunction of cervical region: Secondary | ICD-10-CM | POA: Diagnosis not present

## 2017-05-10 DIAGNOSIS — M9901 Segmental and somatic dysfunction of cervical region: Secondary | ICD-10-CM | POA: Diagnosis not present

## 2017-05-10 DIAGNOSIS — M9903 Segmental and somatic dysfunction of lumbar region: Secondary | ICD-10-CM | POA: Diagnosis not present

## 2017-05-10 DIAGNOSIS — M542 Cervicalgia: Secondary | ICD-10-CM | POA: Diagnosis not present

## 2017-05-10 DIAGNOSIS — M545 Low back pain: Secondary | ICD-10-CM | POA: Diagnosis not present

## 2017-05-19 ENCOUNTER — Ambulatory Visit (INDEPENDENT_AMBULATORY_CARE_PROVIDER_SITE_OTHER): Payer: BLUE CROSS/BLUE SHIELD | Admitting: Internal Medicine

## 2017-05-19 ENCOUNTER — Other Ambulatory Visit: Payer: Self-pay | Admitting: Internal Medicine

## 2017-05-19 ENCOUNTER — Other Ambulatory Visit (INDEPENDENT_AMBULATORY_CARE_PROVIDER_SITE_OTHER): Payer: BLUE CROSS/BLUE SHIELD

## 2017-05-19 ENCOUNTER — Encounter: Payer: Self-pay | Admitting: Internal Medicine

## 2017-05-19 VITALS — BP 152/90 | HR 90 | Temp 99.1°F | Resp 16 | Ht 75.0 in | Wt 371.0 lb

## 2017-05-19 DIAGNOSIS — I10 Essential (primary) hypertension: Secondary | ICD-10-CM | POA: Diagnosis not present

## 2017-05-19 DIAGNOSIS — J0100 Acute maxillary sinusitis, unspecified: Secondary | ICD-10-CM

## 2017-05-19 DIAGNOSIS — Z7252 High risk homosexual behavior: Secondary | ICD-10-CM

## 2017-05-19 LAB — URINALYSIS, ROUTINE W REFLEX MICROSCOPIC
BILIRUBIN URINE: NEGATIVE
Hgb urine dipstick: NEGATIVE
KETONES UR: NEGATIVE
LEUKOCYTES UA: NEGATIVE
NITRITE: NEGATIVE
PH: 6 (ref 5.0–8.0)
SPECIFIC GRAVITY, URINE: 1.015 (ref 1.000–1.030)
Total Protein, Urine: NEGATIVE
URINE GLUCOSE: NEGATIVE
UROBILINOGEN UA: 0.2 (ref 0.0–1.0)

## 2017-05-19 LAB — CBC WITH DIFFERENTIAL/PLATELET
Basophils Absolute: 0.1 10*3/uL (ref 0.0–0.1)
Basophils Relative: 0.5 % (ref 0.0–3.0)
Eosinophils Absolute: 0.3 10*3/uL (ref 0.0–0.7)
Eosinophils Relative: 2.7 % (ref 0.0–5.0)
HCT: 44.5 % (ref 39.0–52.0)
Hemoglobin: 14.1 g/dL (ref 13.0–17.0)
Lymphocytes Relative: 15.1 % (ref 12.0–46.0)
Lymphs Abs: 1.6 10*3/uL (ref 0.7–4.0)
MCHC: 31.8 g/dL (ref 30.0–36.0)
MCV: 80.7 fl (ref 78.0–100.0)
Monocytes Absolute: 0.9 10*3/uL (ref 0.1–1.0)
Monocytes Relative: 8.1 % (ref 3.0–12.0)
Neutro Abs: 7.8 10*3/uL — ABNORMAL HIGH (ref 1.4–7.7)
Neutrophils Relative %: 73.6 % (ref 43.0–77.0)
Platelets: 255 10*3/uL (ref 150.0–400.0)
RBC: 5.52 Mil/uL (ref 4.22–5.81)
RDW: 16.7 % — ABNORMAL HIGH (ref 11.5–15.5)
WBC: 10.7 10*3/uL — ABNORMAL HIGH (ref 4.0–10.5)

## 2017-05-19 LAB — BASIC METABOLIC PANEL
BUN: 10 mg/dL (ref 6–23)
CHLORIDE: 106 meq/L (ref 96–112)
CO2: 29 mEq/L (ref 19–32)
Calcium: 8.8 mg/dL (ref 8.4–10.5)
Creatinine, Ser: 1.1 mg/dL (ref 0.40–1.50)
GFR: 82.93 mL/min (ref >60.00)
Glucose, Bld: 93 mg/dL (ref 70–99)
POTASSIUM: 3.7 meq/L (ref 3.5–5.1)
SODIUM: 138 meq/L (ref 135–145)

## 2017-05-19 MED ORDER — CEFPODOXIME PROXETIL 200 MG PO TABS: 200.0000 mg | ORAL_TABLET | Freq: Two times a day (BID) | ORAL | 0 refills | Status: AC

## 2017-05-19 MED ORDER — AZILSARTAN-CHLORTHALIDONE 40-12.5 MG PO TABS: 1.0000 | ORAL_TABLET | Freq: Every day | ORAL | 1 refills | Status: DC

## 2017-05-19 NOTE — Progress Notes (Signed)
Subjective:  Patient ID: Alexis [name], male    DOB: September 11, 1986  Age: 31 y.o. MRN: [id number]  CC: Hypertension and Sinusitis   HPI Alexis [name] presents for a 3 day history of cough productive of thick green bloody phlegm with sore throat and laryngitis. He also complains of postnasal drip and facial pain. He denies fever, chills, chest pain, hemoptysis, or night sweats.  He also informs me that he has several male sexual partners a month. He reports that his sexual practices are not safe. He wants to be screened for sexually transmitted infections and he wants to start HIV pre-exposure prophylaxis.  He is also concerned that his blood pressure has not recently been well controlled.  Outpatient Medications Prior to Visit  Medication Sig Dispense Refill  . ALPRAZolam (XANAX) 1 MG tablet Take 1 mg by mouth 2 (two) times daily as needed for anxiety.    Marland Kitchen buPROPion (WELLBUTRIN) 75 MG tablet TAKE 1 TABLET BY MOUTH EVERY DAY 90 tablet 3  . CETIRIZINE HCL PO Take by mouth.    . EPINEPHrine (EPIPEN 2-PAK) 0.3 mg/0.3 mL IJ SOAJ injection Inject into the muscle once.    Marland Kitchen ipratropium (ATROVENT) 0.06 % nasal spray SPRAY 2 PUFFS IN THE NOSTRILS THREE TIMES A DAY AS NEEDED FOR NASAL CONGESTION AND DRAINAGE  0  . montelukast (SINGULAIR) 10 MG tablet Take 1 tablet (10 mg total) by mouth at bedtime. 30 tablet 5  . omeprazole (PRILOSEC) 40 MG capsule TAKE 1 CAPSULE BY MOUTH TWICE A DAY 180 capsule 1  . PROAIR HFA 108 (90 Base) MCG/ACT inhaler INHALE 1-2 PUFFS EVERY 4-6 HOURS AS NEEDED FOR PERSISTENT COUGH OR INCREASED WORK OF BREATHING  0  . ranitidine (ZANTAC) 300 MG tablet Take 1 tablet (300 mg total) by mouth at bedtime. (Patient taking differently: Take 300 mg by mouth at bedtime as needed. ) 30 tablet 5  . topiramate (TOPAMAX) 50 MG tablet TAKE 1 TABLET BY MOUTH TWICE A DAY 60 tablet 3  . verapamil (CALAN-SR) 120 MG CR tablet TAKE ONE CAPSULE BY MOUTH EVERY DAY 90 tablet 0  . losartan (COZAAR) 100  MG tablet TAKE 1 TABLET (100 MG TOTAL) BY MOUTH DAILY. 90 tablet 1  . losartan (COZAAR) 100 MG tablet TAKE 1 TABLET (100 MG TOTAL) BY MOUTH DAILY. 90 tablet 1  . verapamil (CALAN-SR) 120 MG CR tablet Take by mouth daily.  2   No facility-administered medications prior to visit.     ROS Review of Systems  Constitutional: Negative for appetite change, chills, diaphoresis, fatigue, fever and unexpected weight change.  HENT: Positive for postnasal drip, rhinorrhea, sinus pressure, sore throat and voice change. Negative for facial swelling, sinus pain and trouble swallowing.   Eyes: Negative.   Respiratory: Positive for cough. Negative for chest tightness, shortness of breath and wheezing.   Cardiovascular: Negative for chest pain, palpitations and leg swelling.  Gastrointestinal: Negative for abdominal pain, diarrhea, nausea and vomiting.  Endocrine: Negative.   Genitourinary: Negative.  Negative for decreased urine volume, discharge, dysuria, genital sores, penile swelling, scrotal swelling and testicular pain.  Musculoskeletal: Negative.   Skin: Negative.   Allergic/Immunologic: Negative.   Neurological: Negative.  Negative for dizziness, weakness and headaches.  Hematological: Negative for adenopathy. Does not bruise/bleed easily.  Psychiatric/Behavioral: Negative.     Objective:  BP (!) 152/90 (BP Location: Left Arm, Patient Position: Sitting, Cuff Size: Large)   Pulse 90   Temp 99.1 F (37.3 C) (Oral)  Resp 16   Ht 6\' 3"  (1.905 m)   Wt (!) 371 lb (168.3 kg)   SpO2 99%   BMI 46.37 kg/m   BP Readings from Last 3 Encounters:  05/19/17 (!) 152/90  12/14/16 136/82  09/17/16 (!) 144/100    Wt Readings from Last 3 Encounters:  05/19/17 (!) 371 lb (168.3 kg)  12/14/16 (!) 375 lb 8 oz (170.3 kg)  09/17/16 (!) 394 lb 2 oz (178.8 kg)    Physical Exam  Constitutional: No distress.  HENT:  Mouth/Throat: Oropharynx is clear and moist. No oropharyngeal exudate.  Eyes:  Conjunctivae are normal. Right eye exhibits no discharge. Left eye exhibits no discharge. No scleral icterus.  Neck: Normal range of motion. Neck supple. No JVD present. No thyromegaly present.  Cardiovascular: Normal rate, regular rhythm and intact distal pulses.  Exam reveals no gallop.   No murmur heard. Pulmonary/Chest: Effort normal and breath sounds normal. No respiratory distress. He has no wheezes. He has no rales. He exhibits no tenderness.  Abdominal: Soft. Bowel sounds are normal. He exhibits no distension and no mass. There is no tenderness. There is no rebound and no guarding.  Musculoskeletal: Normal range of motion. He exhibits no edema or tenderness.  Lymphadenopathy:    He has no cervical adenopathy.  Neurological: He is alert. No cranial nerve deficit. Coordination normal.  Skin: Skin is warm and dry. No rash noted. He is not diaphoretic. No erythema. No pallor.  Psychiatric: He has a normal mood and affect. His behavior is normal. Judgment and thought content normal.  Vitals reviewed.   Lab Results  Component Value Date   WBC 10.7 (H) 05/19/2017   HGB 14.1 05/19/2017   HCT 44.5 05/19/2017   PLT 255.0 05/19/2017   GLUCOSE 93 05/19/2017   CHOL 129 09/17/2016   TRIG 92.0 09/17/2016   HDL 29.90 (L) 09/17/2016   LDLCALC 80 09/17/2016   ALT 32 09/17/2016   AST 25 09/17/2016   NA 138 05/19/2017   K 3.7 05/19/2017   CL 106 05/19/2017   CREATININE 1.10 05/19/2017   BUN 10 05/19/2017   CO2 29 05/19/2017   TSH 1.970 08/25/2016   HGBA1C 5.5 03/08/2007    Ir Fluoro Guide Ndl Plmt / Bx  Result Date: 07/23/2016 CLINICAL DATA:  Positional headache post lumbar puncture 07/17/2016 EXAM: LUMBAR EPIDUROGRAM AND BLOOD PATCH FLUOROSCOPY TIME:  0.6 minute, 229 uGym2 DAP COMPARISON:  07/17/2016 TECHNIQUE: The procedure, risks (including but not limited to bleeding, infection, organ damage ), benefits, and alternatives were explained to the patient. Questions regarding the  procedure were encouraged and answered. The patient understands and consents to the procedure. Under fluoroscopy, an appropriate skin entry site was localized corresponding to the level of previous lumbar puncture. An appropriate skin entry site was determined. Operator donned sterile gloves and mask. Skin site was marked, prepped with Betadine, and draped in usual sterile fashion, and infiltrated locally with 1% lidocaine. A long 20 gauge Crawford needle was advanced into the lumbar posterior epidural space near the midline using a right interlaminar approach at L4-5using loss of resistance technique. Diagnostic injection of 19ml Isovue-M 200 demonstrates good epidural spread above and below the needle tip and crossing the midline. No intravascular or subarachnoid component. 63ml of autologous blood were then administered as lumbar epidural blood patch. No immediate complication. IMPRESSION: 1. Technically successful lumbar epidural blood patch under fluoroscopy. The patient was counseled on the importance of laying flat for an additional 24 hours and maintaining  good p.o. fluid intake. Electronically Signed   By: Lucrezia Europe M.D.   On: 07/23/2016 15:11    Assessment & Plan:   Jorgen was seen today for hypertension and sinusitis.  Diagnoses and all orders for this visit:  Essential hypertension- his blood pressure is not adequately well controlled. We'll upgrade to a more potent ARB and let a thiazide diuretic. His electrolytes and renal function are normal today. -     Azilsartan-Chlorthalidone (EDARBYCLOR) 40-12.5 MG TABS; Take 1 tablet by mouth daily. -     Basic metabolic panel; Future -     CBC with Differential/Platelet; Future  High risk homosexual behavior- he is HIV negative and negative for hepatitis B infection. He will start Truvada for prophylaxis. He has protective antibodies against hep B but not hep A. He agrees to return for hep A vaccines. -     Cancel: HSV(herpes  smplx)abs-1+2(IgG+IgM)-bld; Future -     Urinalysis, Routine w reflex microscopic; Future -     GC/Chlamydia Probe Amp; Future -     Hepatitis B surface antigen; Future -     HIV antibody; Future -     Hepatitis A antibody, total; Future -     Hepatitis B core antibody, total; Future -     Hepatitis B surface antibody; Future -     HSV(herpes simplex vrs) 1+2 ab-IgG; Future -     RPR; Future -     emtricitabine-tenofovir (TRUVADA) 200-300 MG tablet; Take 1 tablet by mouth daily.  Acute maxillary sinusitis, recurrence not specified- will treat this with a cephalosporin antibiotic. -     cefpodoxime (VANTIN) 200 MG tablet; Take 1 tablet (200 mg total) by mouth 2 (two) times daily.   I have discontinued Mr. Silberstein's losartan and losartan. I am also having him start on Azilsartan-Chlorthalidone, cefpodoxime, and emtricitabine-tenofovir. Additionally, I am having him maintain his ALPRAZolam, montelukast, ranitidine, PROAIR HFA, ipratropium, CETIRIZINE HCL PO, EPINEPHrine, buPROPion, omeprazole, topiramate, and verapamil.  Meds ordered this encounter  Medications  . Azilsartan-Chlorthalidone (EDARBYCLOR) 40-12.5 MG TABS    Sig: Take 1 tablet by mouth daily.    Dispense:  90 tablet    Refill:  1  . cefpodoxime (VANTIN) 200 MG tablet    Sig: Take 1 tablet (200 mg total) by mouth 2 (two) times daily.    Dispense:  20 tablet    Refill:  0  . emtricitabine-tenofovir (TRUVADA) 200-300 MG tablet    Sig: Take 1 tablet by mouth daily.    Dispense:  90 tablet    Refill:  0     Follow-up: Return in about 6 weeks (around 06/30/2017).  Scarlette Calico, MD

## 2017-05-19 NOTE — Patient Instructions (Signed)

## 2017-05-20 ENCOUNTER — Encounter: Payer: Self-pay | Admitting: Internal Medicine

## 2017-05-20 DIAGNOSIS — J039 Acute tonsillitis, unspecified: Secondary | ICD-10-CM | POA: Diagnosis not present

## 2017-05-20 LAB — HEPATITIS B SURFACE ANTIBODY, QUANTITATIVE: Hepatitis B-Post: 1000 m[IU]/mL (ref >=10)

## 2017-05-20 LAB — HEPATITIS B SURFACE ANTIGEN: Hepatitis B Surface Ag: NONREACTIVE

## 2017-05-20 LAB — HSV(HERPES SIMPLEX VRS) I + II AB-IGG: HSV 2 GLYCOPROTEIN G AB, IGG: 2.81 {index} — AB (ref <0.90)

## 2017-05-20 LAB — HEPATITIS A ANTIBODY, TOTAL: HEP A TOTAL AB: NONREACTIVE

## 2017-05-20 LAB — GC/CHLAMYDIA PROBE AMP
CT PROBE, AMP APTIMA: NOT DETECTED
GC PROBE AMP APTIMA: NOT DETECTED

## 2017-05-20 LAB — HEPATITIS B CORE ANTIBODY, TOTAL: Hep B Core Total Ab: NONREACTIVE

## 2017-05-20 LAB — HIV ANTIBODY (ROUTINE TESTING W REFLEX): HIV 1&2 Ab, 4th Generation: NONREACTIVE

## 2017-05-20 MED ORDER — EMTRICITABINE-TENOFOVIR DF 200-300 MG PO TABS: 1.0000 | ORAL_TABLET | Freq: Every day | ORAL | 0 refills | Status: DC

## 2017-05-21 ENCOUNTER — Encounter: Payer: Self-pay | Admitting: Internal Medicine

## 2017-05-21 LAB — RPR

## 2017-05-24 ENCOUNTER — Other Ambulatory Visit: Payer: Self-pay | Admitting: Internal Medicine

## 2017-05-24 DIAGNOSIS — I10 Essential (primary) hypertension: Secondary | ICD-10-CM

## 2017-05-24 MED ORDER — IRBESARTAN-HYDROCHLOROTHIAZIDE 300-12.5 MG PO TABS: 1.0000 | ORAL_TABLET | Freq: Every day | ORAL | 1 refills | Status: DC

## 2017-05-28 ENCOUNTER — Other Ambulatory Visit: Payer: Self-pay | Admitting: Neurology

## 2017-06-08 ENCOUNTER — Other Ambulatory Visit: Payer: Self-pay | Admitting: Internal Medicine

## 2017-06-08 ENCOUNTER — Telehealth: Payer: Self-pay

## 2017-06-08 NOTE — Telephone Encounter (Signed)
RX was already sent on 05/20/17

## 2017-06-08 NOTE — Telephone Encounter (Signed)
Requesting rx for truvada

## 2017-06-09 DIAGNOSIS — M9903 Segmental and somatic dysfunction of lumbar region: Secondary | ICD-10-CM | POA: Diagnosis not present

## 2017-06-09 DIAGNOSIS — M542 Cervicalgia: Secondary | ICD-10-CM | POA: Diagnosis not present

## 2017-06-09 DIAGNOSIS — M545 Low back pain: Secondary | ICD-10-CM | POA: Diagnosis not present

## 2017-06-09 DIAGNOSIS — M9901 Segmental and somatic dysfunction of cervical region: Secondary | ICD-10-CM | POA: Diagnosis not present

## 2017-06-09 NOTE — Telephone Encounter (Signed)
LVM for pt to call back as soon as possible.   

## 2017-06-11 ENCOUNTER — Ambulatory Visit (INDEPENDENT_AMBULATORY_CARE_PROVIDER_SITE_OTHER): Payer: BLUE CROSS/BLUE SHIELD

## 2017-06-11 DIAGNOSIS — H40013 Open angle with borderline findings, low risk, bilateral: Secondary | ICD-10-CM | POA: Diagnosis not present

## 2017-06-11 DIAGNOSIS — Z23 Encounter for immunization: Secondary | ICD-10-CM

## 2017-06-11 DIAGNOSIS — H47323 Drusen of optic disc, bilateral: Secondary | ICD-10-CM | POA: Diagnosis not present

## 2017-06-11 NOTE — Telephone Encounter (Signed)
Patient has involved our site [employer] in this.   Closing note.

## 2017-06-13 ENCOUNTER — Other Ambulatory Visit: Payer: Self-pay | Admitting: Internal Medicine

## 2017-06-13 DIAGNOSIS — K209 Esophagitis, unspecified without bleeding: Secondary | ICD-10-CM

## 2017-06-13 DIAGNOSIS — I10 Essential (primary) hypertension: Secondary | ICD-10-CM

## 2017-06-15 ENCOUNTER — Encounter: Payer: Self-pay | Admitting: Internal Medicine

## 2017-06-15 ENCOUNTER — Other Ambulatory Visit: Payer: Self-pay | Admitting: Internal Medicine

## 2017-06-15 DIAGNOSIS — M545 Low back pain: Secondary | ICD-10-CM | POA: Diagnosis not present

## 2017-06-15 DIAGNOSIS — M542 Cervicalgia: Secondary | ICD-10-CM | POA: Diagnosis not present

## 2017-06-15 DIAGNOSIS — M9903 Segmental and somatic dysfunction of lumbar region: Secondary | ICD-10-CM | POA: Diagnosis not present

## 2017-06-15 DIAGNOSIS — Z7252 High risk homosexual behavior: Secondary | ICD-10-CM

## 2017-06-15 DIAGNOSIS — M9901 Segmental and somatic dysfunction of cervical region: Secondary | ICD-10-CM | POA: Diagnosis not present

## 2017-06-15 MED ORDER — EMTRICITABINE-TENOFOVIR DF 200-300 MG PO TABS: 1.0000 | ORAL_TABLET | Freq: Every day | ORAL | 0 refills | Status: DC

## 2017-06-22 DIAGNOSIS — M9901 Segmental and somatic dysfunction of cervical region: Secondary | ICD-10-CM | POA: Diagnosis not present

## 2017-06-22 DIAGNOSIS — M542 Cervicalgia: Secondary | ICD-10-CM | POA: Diagnosis not present

## 2017-06-22 DIAGNOSIS — M545 Low back pain: Secondary | ICD-10-CM | POA: Diagnosis not present

## 2017-06-22 DIAGNOSIS — M9903 Segmental and somatic dysfunction of lumbar region: Secondary | ICD-10-CM | POA: Diagnosis not present

## 2017-06-24 DIAGNOSIS — M9901 Segmental and somatic dysfunction of cervical region: Secondary | ICD-10-CM | POA: Diagnosis not present

## 2017-06-24 DIAGNOSIS — M9903 Segmental and somatic dysfunction of lumbar region: Secondary | ICD-10-CM | POA: Diagnosis not present

## 2017-06-24 DIAGNOSIS — M542 Cervicalgia: Secondary | ICD-10-CM | POA: Diagnosis not present

## 2017-06-24 DIAGNOSIS — M545 Low back pain: Secondary | ICD-10-CM | POA: Diagnosis not present

## 2017-06-29 DIAGNOSIS — M545 Low back pain: Secondary | ICD-10-CM | POA: Diagnosis not present

## 2017-06-29 DIAGNOSIS — M9901 Segmental and somatic dysfunction of cervical region: Secondary | ICD-10-CM | POA: Diagnosis not present

## 2017-06-29 DIAGNOSIS — M9903 Segmental and somatic dysfunction of lumbar region: Secondary | ICD-10-CM | POA: Diagnosis not present

## 2017-06-29 DIAGNOSIS — M542 Cervicalgia: Secondary | ICD-10-CM | POA: Diagnosis not present

## 2017-07-06 ENCOUNTER — Encounter (HOSPITAL_COMMUNITY): Payer: Self-pay | Admitting: Emergency Medicine

## 2017-07-06 ENCOUNTER — Emergency Department (HOSPITAL_COMMUNITY): Payer: BLUE CROSS/BLUE SHIELD

## 2017-07-06 ENCOUNTER — Emergency Department (HOSPITAL_COMMUNITY)
Admission: EM | Admit: 2017-07-06 | Discharge: 2017-07-06 | Disposition: A | Discharge status: Home / Self Care | Payer: BLUE CROSS/BLUE SHIELD | Attending: Emergency Medicine | Admitting: Emergency Medicine

## 2017-07-06 DIAGNOSIS — R05 Cough: Secondary | ICD-10-CM | POA: Diagnosis not present

## 2017-07-06 DIAGNOSIS — Z79899 Other long term (current) drug therapy: Secondary | ICD-10-CM | POA: Diagnosis not present

## 2017-07-06 DIAGNOSIS — E785 Hyperlipidemia, unspecified: Secondary | ICD-10-CM | POA: Insufficient documentation

## 2017-07-06 DIAGNOSIS — J111 Influenza due to unidentified influenza virus with other respiratory manifestations: Secondary | ICD-10-CM | POA: Diagnosis not present

## 2017-07-06 DIAGNOSIS — M791 Myalgia: Secondary | ICD-10-CM | POA: Diagnosis not present

## 2017-07-06 DIAGNOSIS — R0981 Nasal congestion: Secondary | ICD-10-CM | POA: Insufficient documentation

## 2017-07-06 DIAGNOSIS — R079 Chest pain, unspecified: Secondary | ICD-10-CM | POA: Diagnosis not present

## 2017-07-06 DIAGNOSIS — R0602 Shortness of breath: Secondary | ICD-10-CM | POA: Insufficient documentation

## 2017-07-06 DIAGNOSIS — I10 Essential (primary) hypertension: Secondary | ICD-10-CM | POA: Diagnosis not present

## 2017-07-06 DIAGNOSIS — J45909 Unspecified asthma, uncomplicated: Secondary | ICD-10-CM | POA: Insufficient documentation

## 2017-07-06 DIAGNOSIS — R69 Illness, unspecified: Secondary | ICD-10-CM

## 2017-07-06 DIAGNOSIS — R509 Fever, unspecified: Secondary | ICD-10-CM | POA: Insufficient documentation

## 2017-07-06 LAB — BASIC METABOLIC PANEL
ANION GAP: 6 (ref 5–15)
BUN: 10 mg/dL (ref 6–20)
CALCIUM: 9 mg/dL (ref 8.9–10.3)
CO2: 25 mmol/L (ref 22–32)
Chloride: 107 mmol/L (ref 101–111)
Creatinine, Ser: 0.86 mg/dL (ref 0.61–1.24)
GFR calc Af Amer: >60 mL/min (ref >60)
GLUCOSE: 141 mg/dL — AB (ref 65–99)
Potassium: 3.9 mmol/L (ref 3.5–5.1)
Sodium: 138 mmol/L (ref 135–145)

## 2017-07-06 LAB — CBC WITH DIFFERENTIAL/PLATELET
BASOS PCT: 1 %
Basophils Absolute: 0.1 10*3/uL (ref 0.0–0.1)
EOS ABS: 0 10*3/uL (ref 0.0–0.7)
EOS PCT: 0 %
HEMATOCRIT: 45.3 % (ref 39.0–52.0)
Hemoglobin: 14 g/dL (ref 13.0–17.0)
LYMPHS ABS: 2.4 10*3/uL (ref 0.7–4.0)
Lymphocytes Relative: 27 %
MCH: 25 pg — AB (ref 26.0–34.0)
MCHC: 30.9 g/dL (ref 30.0–36.0)
MCV: 80.9 fL (ref 78.0–100.0)
MONO ABS: 0.8 10*3/uL (ref 0.1–1.0)
Monocytes Relative: 9 %
NEUTROS ABS: 5.5 10*3/uL (ref 1.7–7.7)
NEUTROS PCT: 63 %
PLATELETS: 197 10*3/uL (ref 150–400)
RBC: 5.6 MIL/uL (ref 4.22–5.81)
RDW: 15.8 % — AB (ref 11.5–15.5)
WBC: 8.8 10*3/uL (ref 4.0–10.5)

## 2017-07-06 MED ORDER — HYDROCOD POLST-CPM POLST ER 10-8 MG/5ML PO SUER: 5.0000 mL | Freq: Every evening | ORAL | 0 refills | Status: DC | PRN

## 2017-07-06 MED ORDER — ALBUTEROL SULFATE HFA 108 (90 BASE) MCG/ACT IN AERS
2.0000 | INHALATION_SPRAY | RESPIRATORY_TRACT | Status: DC | PRN
  Administered 2017-07-06: 2 via RESPIRATORY_TRACT
  Filled 2017-07-06: qty 6.7

## 2017-07-06 MED ORDER — BENZONATATE 100 MG PO CAPS: 100.0000 mg | ORAL_CAPSULE | Freq: Three times a day (TID) | ORAL | 0 refills | Status: DC

## 2017-07-06 MED ORDER — ALBUTEROL SULFATE (2.5 MG/3ML) 0.083% IN NEBU
5.0000 mg | INHALATION_SOLUTION | Freq: Once | RESPIRATORY_TRACT | Status: AC
  Administered 2017-07-06: 5 mg via RESPIRATORY_TRACT
  Filled 2017-07-06: qty 6

## 2017-07-06 NOTE — ED Notes (Signed)
ED Provider at bedside. 

## 2017-07-06 NOTE — Discharge Instructions (Signed)
Take tylenol 2 pills 4 times a day and motrin 4 pills 3 times a day.  Drink plenty of fluids.  Return for worsening shortness of breath, headache, confusion. Follow up with your family doctor.   Use your inhaler as needed.  If you need it more often than every 4 hours return to the ED.

## 2017-07-06 NOTE — ED Provider Notes (Signed)
[facility] DEPT Provider Note   CSN: [id number] Arrival date & time: 07/06/17  0908     History   Chief Complaint Chief Complaint  Patient presents with  . Shortness of Breath    HPI Alexis [name] is a 31 y.o. male.  31 yo M With a chief complaint of cough congestion fevers chills myalgias. This been going on for the past week. He has a history of asthma that's only exacerbated with restoration infections. He has not been using an inhaler because he ran out He saw a physician in urgent care about a week ago. Was started on steroids with some mild improvement. Everyone at work as the same illness. Denies nausea or vomiting. Denies rash.   The history is provided by the patient.  Shortness of Breath  This is a new problem. The average episode lasts 1 week. The problem occurs continuously.The current episode started more than 2 days ago. The problem has not changed since onset.Associated symptoms include a fever and cough. Pertinent negatives include no headaches, no chest pain, no vomiting, no abdominal pain and no rash. He has tried nothing for the symptoms. The treatment provided no relief. He has had no prior hospitalizations. He has had no prior ED visits. Associated medical issues include asthma.    Past Medical History:  Diagnosis Date  . Acid reflux   . ADD 09/13/2007  . ALLERGIC RHINITIS 06/10/2009  . ASTHMA, UNSPECIFIED, UNSPECIFIED STATUS 07/19/2008  . DEPRESSIVE DISORDER 09/13/2007  . DYSMETABOLIC SYNDROME 5/36/6440  . GERD 09/13/2007  . HYPERLIPIDEMIA 10/10/2009  . HYPERTENSION 09/13/2007  . IBS 03/20/2009  . Morbid obesity ([place]) 07/19/2008  . Skin cancer     Patient Active Problem List   Diagnosis Date Noted  . High risk homosexual behavior 05/19/2017  . Acute maxillary sinusitis 09/09/2015  . 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 Hyperlipidemia LDL goal <130 10/10/2009  . Morbid obesity ([place]) 07/19/2008  . Mild persistent asthma  07/19/2008  . GAD (generalized anxiety disorder) 09/13/2007  . Essential hypertension 09/13/2007  . GERD (gastroesophageal reflux disease) 09/13/2007    Past Surgical History:  Procedure Laterality Date  . ANKLE SURGERY  2007  . APPENDECTOMY  2005  . IR GENERIC HISTORICAL  07/23/2016   IR FLUORO GUIDED NEEDLE PLC ASPIRATION/INJECTION LOC 07/23/2016 Arne Cleveland, MD [facility]  . SKIN CANCER EXCISION     teenager  . TEE WITHOUT CARDIOVERSION N/A 04/15/2016   Procedure: TRANSESOPHAGEAL ECHOCARDIOGRAM (TEE);  Surgeon: Sanda Klein, MD;  Location: [facility] ENDOSCOPY;  Service: Cardiovascular;  Laterality: N/A;       Home Medications    Prior to Admission medications   Medication Sig Start Date End Date Taking? Authorizing Provider  ALPRAZolam Duanne Moron) 1 MG tablet Take 1 mg by mouth 2 (two) times daily as needed for anxiety.    [provider]  buPROPion (WELLBUTRIN) 75 MG tablet TAKE 1 TABLET BY MOUTH EVERY DAY 10/28/16   Janith Lima, MD  CETIRIZINE HCL PO Take by mouth.    [provider]  chlorpheniramine-HYDROcodone (TUSSIONEX PENNKINETIC ER) 10-8 MG/5ML SUER Take 5 mLs by mouth at bedtime as needed for cough. 07/06/17   Deno Etienne, DO  emtricitabine-tenofovir (TRUVADA) 200-300 MG tablet Take 1 tablet by mouth daily. 06/15/17   Janith Lima, MD  EPINEPHrine (EPIPEN 2-PAK) 0.3 mg/0.3 mL IJ SOAJ injection Inject into the muscle once.    [provider]  ipratropium (ATROVENT) 0.06 % nasal spray SPRAY 2 PUFFS IN  THE NOSTRILS THREE TIMES A DAY AS NEEDED FOR NASAL CONGESTION AND DRAINAGE 09/08/16   [provider]  irbesartan-hydrochlorothiazide (AVALIDE) 300-12.5 MG tablet Take 1 tablet by mouth daily. 05/24/17   Janith Lima, MD  montelukast (SINGULAIR) 10 MG tablet Take 1 tablet (10 mg total) by mouth at bedtime. 08/25/16   Kozlow, Donnamarie Poag, MD  omeprazole (PRILOSEC) 40 MG capsule TAKE 1 CAPSULE BY MOUTH TWICE A DAY 06/14/17   Janith Lima, MD    PROAIR HFA 108 (90 Base) MCG/ACT inhaler INHALE 1-2 PUFFS EVERY 4-6 HOURS AS NEEDED FOR PERSISTENT COUGH OR INCREASED WORK OF BREATHING 09/08/16   [provider]  ranitidine (ZANTAC) 300 MG tablet Take 1 tablet (300 mg total) by mouth at bedtime. Patient taking differently: Take 300 mg by mouth at bedtime as needed.  08/25/16   Kozlow, Donnamarie Poag, MD  topiramate (TOPAMAX) 50 MG tablet TAKE 1 TABLET BY MOUTH TWICE A DAY 01/28/17   Melvenia Beam, MD  verapamil (CALAN-SR) 120 MG CR tablet TAKE ONE CAPSULE BY MOUTH EVERY DAY 06/14/17   Janith Lima, MD    Family History Family History  Problem Relation Age of Onset  . Hypertension Father   . Diabetes Father        type 2   . Asthma Mother   . Clotting disorder Mother        Factor 5  . Rheum arthritis Mother   . Skin cancer Mother   . Emphysema Maternal Grandmother        smoker  . Asthma Maternal Grandmother   . Lung cancer Maternal Grandmother   . Allergies Unknown        whole family-both sides of family  . Asthma Paternal Grandmother   . Diabetes Paternal Grandmother        type 2  . Heart disease Unknown        MGM deceased with MI; both sides of grandparents  . Clotting disorder Maternal Uncle        Factor 5    Social History Social History  Substance Use Topics  . Smoking status: Never Smoker  . Smokeless tobacco: Never Used  . Alcohol use No     Comment: socially     Allergies   Citrus; Amoxicillin-pot clavulanate; Sulfamethoxazole-trimethoprim; and Vancomycin   Review of Systems Review of Systems  Constitutional: Positive for chills and fever.  HENT: Positive for congestion. Negative for facial swelling.   Eyes: Negative for discharge and visual disturbance.  Respiratory: Positive for cough. Negative for shortness of breath.   Cardiovascular: Negative for chest pain and palpitations.  Gastrointestinal: Negative for abdominal pain, diarrhea and vomiting.  Musculoskeletal: Negative for arthralgias  and myalgias.  Skin: Negative for color change and rash.  Neurological: Negative for tremors, syncope and headaches.  Psychiatric/Behavioral: Negative for confusion and dysphoric mood.     Physical Exam Updated Vital Signs BP (!) 132/91   Pulse 97   Temp 99.2 F (37.3 C) (Oral)   Resp 13   SpO2 98%   Physical Exam  Constitutional: He is oriented to person, place, and time. He appears well-developed and well-nourished.  HENT:  Head: Normocephalic and atraumatic.  Swollen turbinates, posterior nasal drip, no noted sinus ttp, tm normal bilaterally.    Eyes: Pupils are equal, round, and reactive to light. EOM are normal.  Neck: Normal range of motion. Neck supple. No JVD present.  Cardiovascular: Normal rate and regular rhythm.  Exam reveals no  gallop and no friction rub.   No murmur heard. Pulmonary/Chest: No respiratory distress. He has no wheezes.  Abdominal: He exhibits no distension and no mass. There is no tenderness. There is no rebound and no guarding.  Musculoskeletal: Normal range of motion.  Neurological: He is alert and oriented to person, place, and time.  Skin: No rash noted. No pallor.  Psychiatric: He has a normal mood and affect. His behavior is normal.  Nursing note and vitals reviewed.    ED Treatments / Results  Labs (all labs ordered are listed, but only abnormal results are displayed) Labs Reviewed  CBC WITH DIFFERENTIAL/PLATELET - Abnormal; Notable for the following:       Result Value   MCH 25.0 (*)    RDW 15.8 (*)    All other components within normal limits  BASIC METABOLIC PANEL - Abnormal; Notable for the following:    Glucose, Bld 141 (*)    All other components within normal limits    EKG  EKG Interpretation  Date/Time:  Tuesday July 06 2017 09:11:53 EDT Ventricular Rate:  97 PR Interval:  134 QRS Duration: 90 QT Interval:  350 QTC Calculation: 444 R Axis:   -19 Text Interpretation:  Normal sinus rhythm Possible Lateral  infarct , age undetermined Abnormal ECG No significant change since last tracing Confirmed by Deno Etienne [phone]) on 07/06/2017 9:26:01 AM       Radiology Dg Chest 2 View  Result Date: 07/06/2017 CLINICAL DATA:  Chest pain, shortness of breath. EXAM: CHEST  2 VIEW COMPARISON:  Radiographs of April 22, 2016. FINDINGS: The heart size and mediastinal contours are within normal limits. Both lungs are clear. No pneumothorax or pleural effusion is noted. The visualized skeletal structures are unremarkable. IMPRESSION: No active cardiopulmonary disease. Electronically Signed   By: Marijo Conception, M.D.   On: 07/06/2017 10:23    Procedures Procedures (including critical care time)  Medications Ordered in ED Medications  albuterol (PROVENTIL HFA;VENTOLIN HFA) 108 (90 Base) MCG/ACT inhaler 2 puff (not administered)  albuterol (PROVENTIL) (2.5 MG/3ML) 0.083% nebulizer solution 5 mg (5 mg Nebulization Given 07/06/17 0947)     Initial Impression / Assessment and Plan / ED Course  I have reviewed the triage vital signs and the nursing notes.  Pertinent labs & imaging results that were available during my care of the patient were reviewed by me and considered in my medical decision making (see chart for details).     31 yo M with a chief complaint of an influenza-like illness. Chest x-ray negative for pneumonia. No other secondary bacterial source found on exam. Will change his cough medicine. Sent home with an inhaler. He is just on steroids so will not redosed at this time. PCP follow-up.   10:44 AM:  I have discussed the diagnosis/risks/treatment options with the patient and family and believe the pt to be eligible for discharge home to follow-up with PCP. We also discussed returning to the ED immediately if new or worsening sx occur. We discussed the sx which are most concerning (e.g., sudden worsening pain, fever, inability to tolerate by mouth) that necessitate immediate return. Medications  administered to the patient during their visit and any new prescriptions provided to the patient are listed below.  Medications given during this visit Medications  albuterol (PROVENTIL HFA;VENTOLIN HFA) 108 (90 Base) MCG/ACT inhaler 2 puff (not administered)  albuterol (PROVENTIL) (2.5 MG/3ML) 0.083% nebulizer solution 5 mg (5 mg Nebulization Given 07/06/17 0947)     The  patient appears reasonably screen and/or stabilized for discharge and I doubt any other medical condition or other [facility] requiring further screening, evaluation, or treatment in the ED at this time prior to discharge.   Final Clinical Impressions(s) / ED Diagnoses   Final diagnoses:  Influenza-like illness    New Prescriptions New Prescriptions   CHLORPHENIRAMINE-HYDROCODONE (TUSSIONEX PENNKINETIC ER) 10-8 MG/5ML SUER    Take 5 mLs by mouth at bedtime as needed for cough.     Deno Etienne, DO 07/06/17 1044

## 2017-07-06 NOTE — ED Triage Notes (Signed)
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

## 2017-07-12 ENCOUNTER — Encounter: Payer: Self-pay | Admitting: Family Medicine

## 2017-07-12 ENCOUNTER — Ambulatory Visit (INDEPENDENT_AMBULATORY_CARE_PROVIDER_SITE_OTHER): Payer: BLUE CROSS/BLUE SHIELD | Admitting: Family Medicine

## 2017-07-12 VITALS — BP 112/80 | HR 112 | Temp 99.2°F | Ht 75.0 in | Wt 374.0 lb

## 2017-07-12 DIAGNOSIS — R509 Fever, unspecified: Secondary | ICD-10-CM

## 2017-07-12 DIAGNOSIS — R05 Cough: Secondary | ICD-10-CM | POA: Diagnosis not present

## 2017-07-12 DIAGNOSIS — R059 Cough, unspecified: Secondary | ICD-10-CM

## 2017-07-12 NOTE — Patient Instructions (Signed)
Go ahead and start the Tussionex one tsp at night

## 2017-07-12 NOTE — Progress Notes (Signed)
Subjective:     Patient ID: Alexis [name], male   DOB: 02-04-1986, 31 y.o.   MRN: [id number]  HPI Patient is nonsmoker who is seen with persistent cough for the past few weeks. He also states he had some low-grade fever over this time. He has history of mild intermittent asthma. Cough has been severe at night. Denies any recent travels. He went to urgent care about a week and half ago Saturday and was prescribed prednisone, Zithromax, and Tessalon. His cough seemed to improve slightly then last Wednesday went to the ER with worsening cough. Chest x-ray unremarkable. CBC normal. Basic metabolic panel unremarkable.  Patient is not aware of any wheezing. No sinusitis symptoms. No headache. No arthralgias. No rash. He was prescribed Tussionex in the ER but never got this prescription filled. Tessalon does not work very well for his cough. He states he had fever up to 104.1 couple days ago He does work in [employer]. Tuberculosis screen has been negative in the past but this was about a year ago. No chest pains. No dyspnea.  Past Medical History:  Diagnosis Date  . Acid reflux   . ADD 09/13/2007  . ALLERGIC RHINITIS 06/10/2009  . ASTHMA, UNSPECIFIED, UNSPECIFIED STATUS 07/19/2008  . DEPRESSIVE DISORDER 09/13/2007  . DYSMETABOLIC SYNDROME 7/56/4332  . GERD 09/13/2007  . HYPERLIPIDEMIA 10/10/2009  . HYPERTENSION 09/13/2007  . IBS 03/20/2009  . Morbid obesity ([place]) 07/19/2008  . Skin cancer    Past Surgical History:  Procedure Laterality Date  . ANKLE SURGERY  2007  . APPENDECTOMY  2005  . IR GENERIC HISTORICAL  07/23/2016   IR FLUORO GUIDED NEEDLE PLC ASPIRATION/INJECTION LOC 07/23/2016 Arne Cleveland, MD [facility]-INTERV RAD  . SKIN CANCER EXCISION     teenager  . TEE WITHOUT CARDIOVERSION N/A 04/15/2016   Procedure: TRANSESOPHAGEAL ECHOCARDIOGRAM (TEE);  Surgeon: Sanda Klein, MD;  Location: [facility] ENDOSCOPY;  Service: Cardiovascular;  Laterality: N/A;    reports that he has never smoked. He has  never used smokeless tobacco. He reports that he does not drink alcohol or use drugs. family history includes Allergies in his unknown relative; Asthma in his maternal grandmother, mother, and paternal grandmother; Clotting disorder in his maternal uncle and mother; Diabetes in his father and paternal grandmother; Emphysema in his maternal grandmother; Heart disease in his unknown relative; Hypertension in his father; Lung cancer in his maternal grandmother; Rheum arthritis in his mother; Skin cancer in his mother. Allergies  Allergen Reactions  . Citrus Anaphylaxis and Other (See Comments)    Lemon  . Amoxicillin-Pot Clavulanate Nausea Only  . Sulfamethoxazole-Trimethoprim Other (See Comments)    Does not remember, long ago  . Vancomycin Other (See Comments)    Mild red mans syndrome, infuse more slowly.     Review of Systems  Constitutional: Positive for fever. Negative for appetite change, chills and unexpected weight change.  HENT: Negative for postnasal drip.   Respiratory: Positive for cough. Negative for shortness of breath and wheezing.   Cardiovascular: Negative for chest pain, palpitations and leg swelling.  Gastrointestinal: Negative for abdominal pain.  Neurological: Negative for dizziness.       Objective:   Physical Exam  Constitutional: He appears well-developed and well-nourished.  HENT:  Right Ear: External ear normal.  Left Ear: External ear normal.  Mouth/Throat: Oropharynx is clear and moist.  Neck: Neck supple.  Cardiovascular: Normal rate and regular rhythm.   Pulmonary/Chest: Effort normal and breath sounds normal. No respiratory distress. He has no wheezes.  He has no rales.  Musculoskeletal: He exhibits no edema.  Skin: No rash noted.       Assessment:     Patient seen with three-week history of cough and reported intermittent low-grade fever. Nontoxic in appearance. No respiratory distress. Pulse oximetry 98%. Recent chest x-ray and lab work  unremarkable. He was treated with Zithromax by urgent care couple weeks ago which should've covered atypicals. No signs or symptoms of acute inflammatory arthritis    Plan:     -Check quantiferon gold assay-given duration of reported fever -Is encouraged to go ahead and start Tussionex 1 teaspoon daily at bedtime for severe cough at night -Recent CBC and chest x-ray reviewed and unremarkable and these were not repeated today -Follow-up immediately for any dyspnea, hemoptysis, or any other concerns  Eulas Post MD [facility]

## 2017-07-14 ENCOUNTER — Encounter: Payer: Self-pay | Admitting: Family Medicine

## 2017-07-14 LAB — QUANTIFERON TB GOLD ASSAY (BLOOD)
Mitogen-Nil: >10 IU/mL
QUANTIFERON(R)-TB GOLD: NEGATIVE
Quantiferon Nil Value: 0.57 IU/mL
Quantiferon Tb Ag Minus Nil Value: 0.02 IU/mL

## 2017-07-30 IMAGING — CR DG SINUSES COMPLETE 3+V
3 series · 3 of 3 positions shown · non-contrast
Comparison: CT head dated 11/13/2014

CLINICAL DATA: Chronic right maxillary and ethmoid sinusitis, worse
x1 week

EXAM:
PARANASAL SINUSES - COMPLETE 3 + VIEW

[view not recorded (1 of 3)]
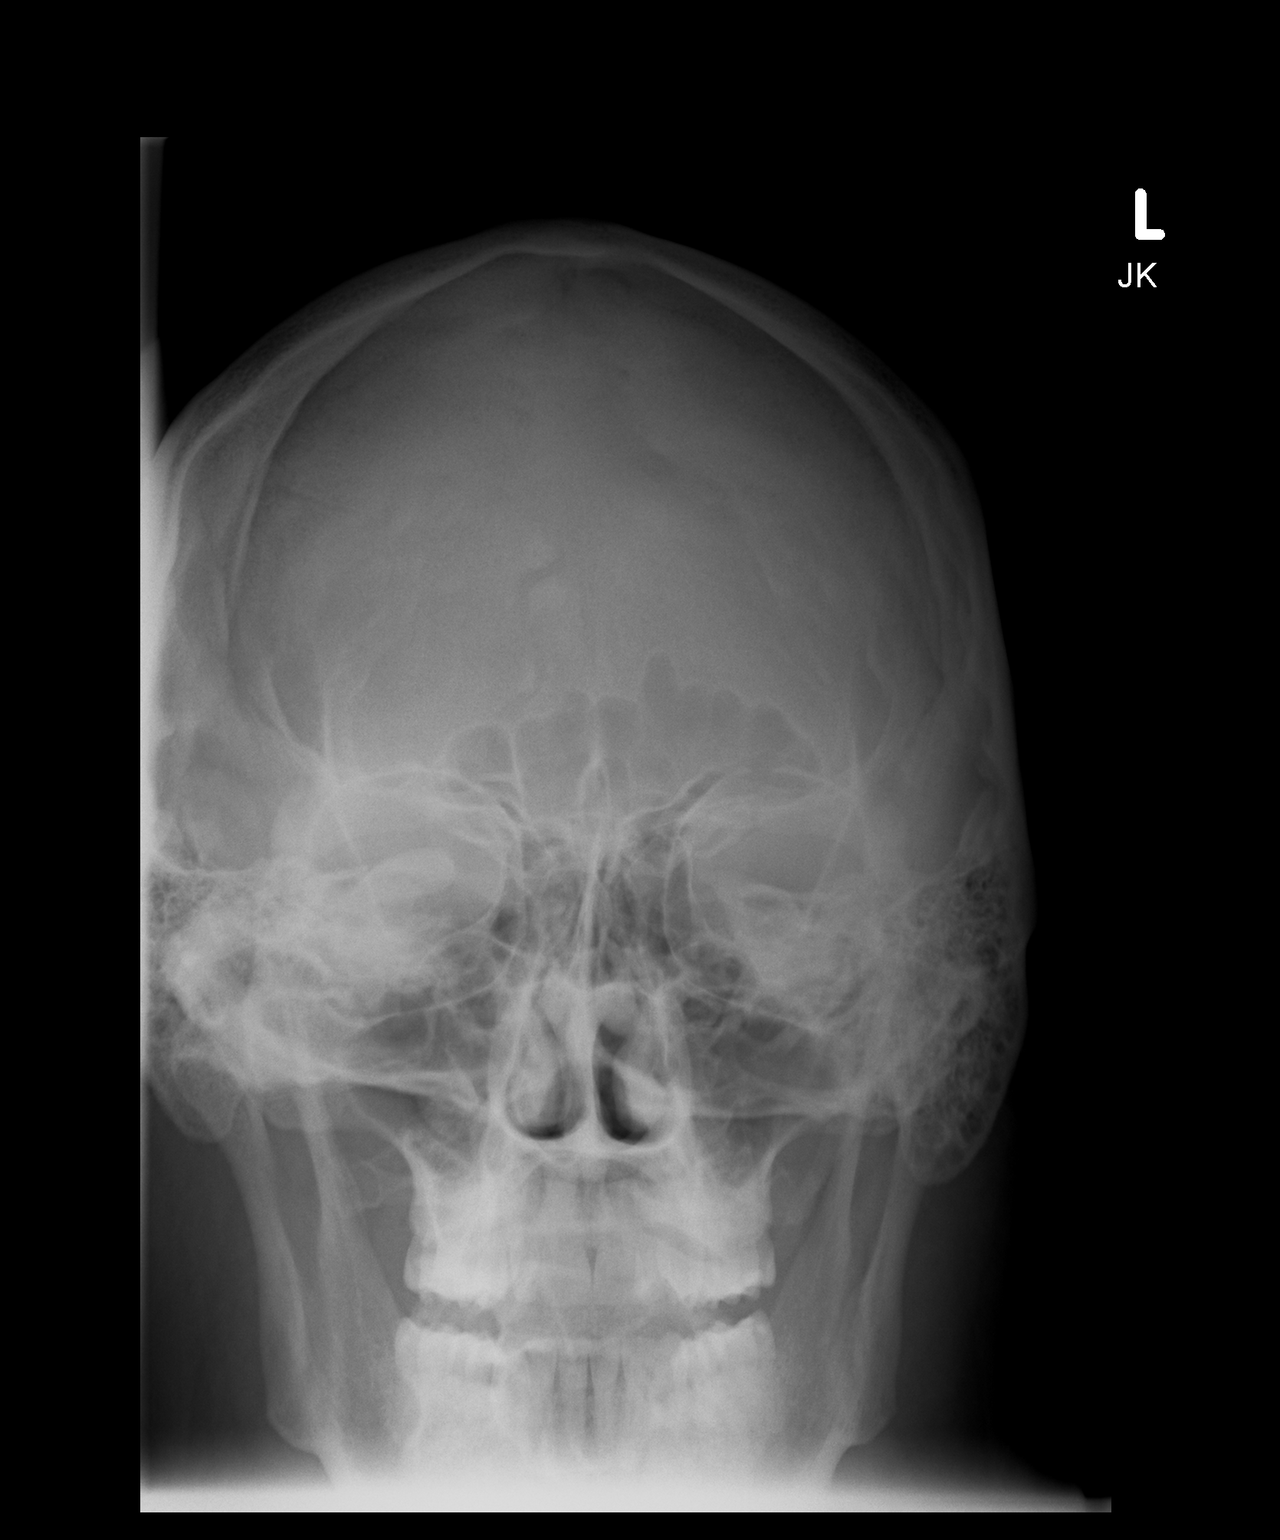

[view not recorded (2 of 3)]
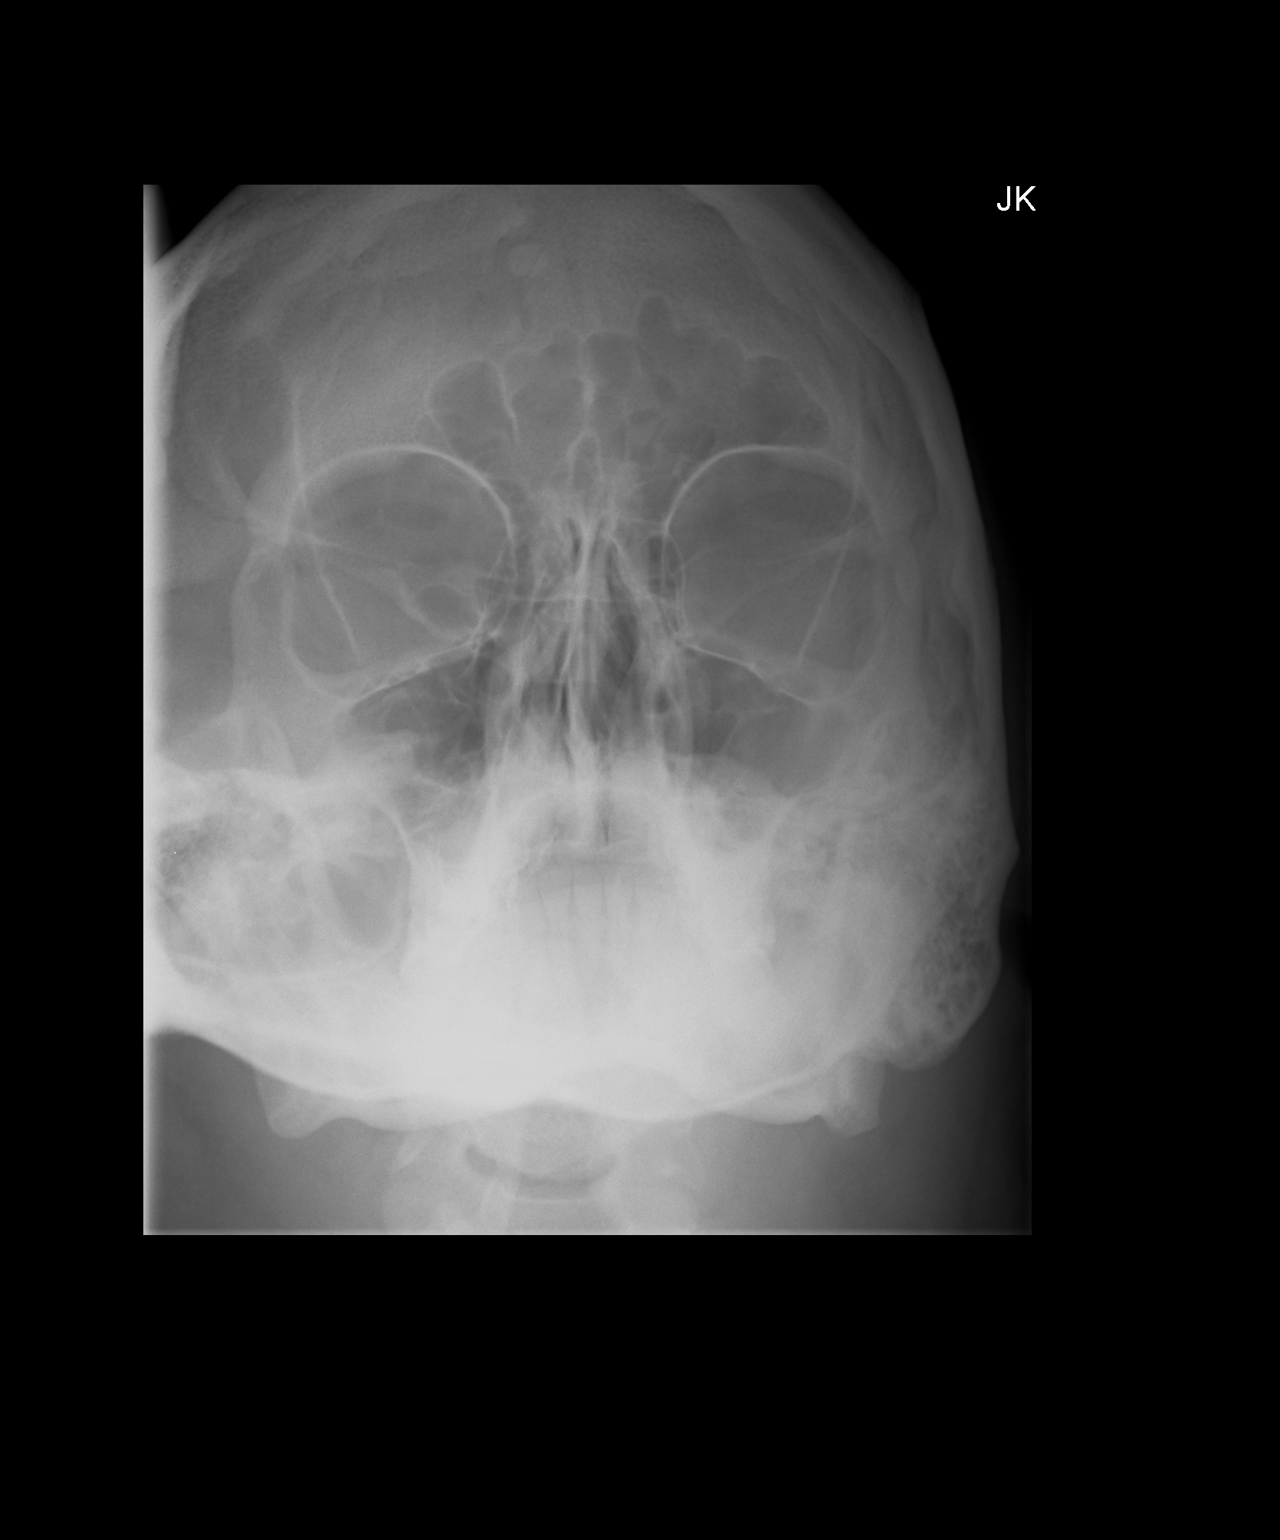

[view not recorded (3 of 3)]
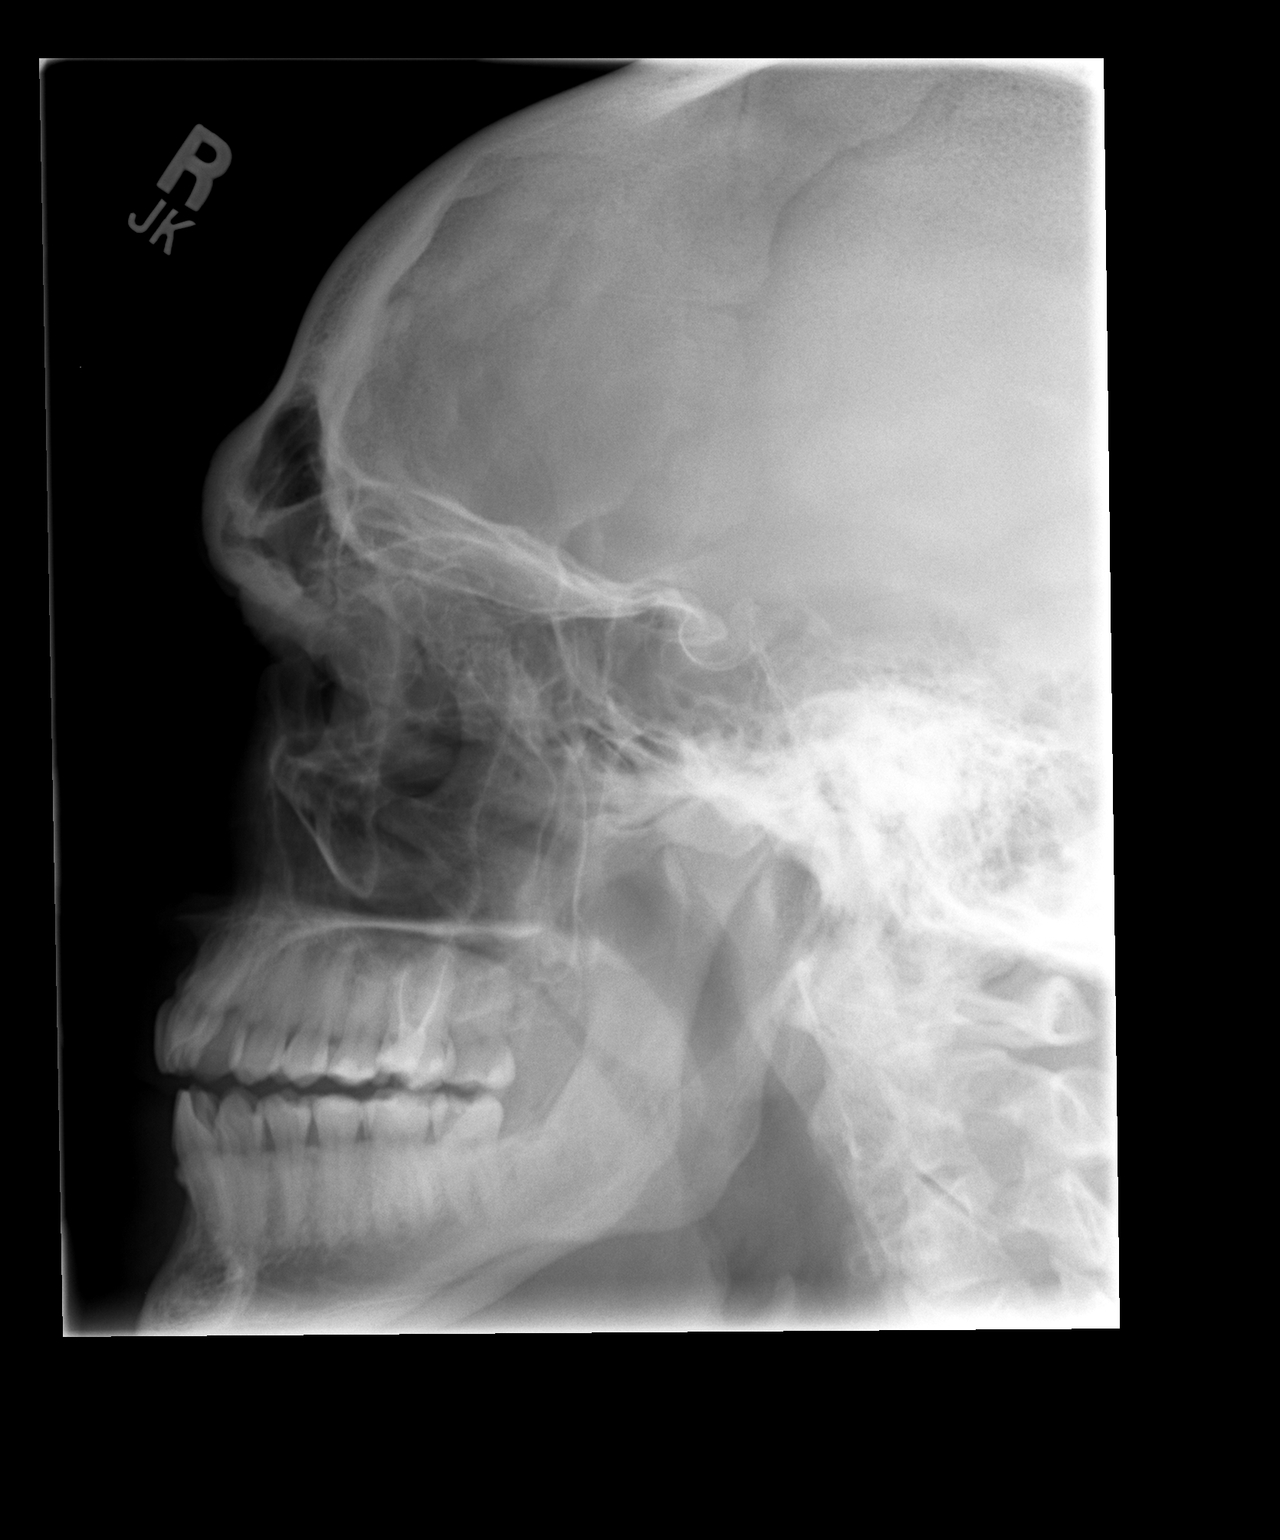

[3 of 3 positions shown; findings below may reference images not displayed]

FINDINGS: Visualized paranasal sinuses are essentially clear.

No air-fluid levels are present.

No gross fracture is seen.
IMPRESSION: Negative.

## 2017-08-03 DIAGNOSIS — M542 Cervicalgia: Secondary | ICD-10-CM | POA: Diagnosis not present

## 2017-08-03 DIAGNOSIS — M9901 Segmental and somatic dysfunction of cervical region: Secondary | ICD-10-CM | POA: Diagnosis not present

## 2017-08-03 DIAGNOSIS — M9903 Segmental and somatic dysfunction of lumbar region: Secondary | ICD-10-CM | POA: Diagnosis not present

## 2017-08-03 DIAGNOSIS — M545 Low back pain: Secondary | ICD-10-CM | POA: Diagnosis not present

## 2017-08-18 DIAGNOSIS — J322 Chronic ethmoidal sinusitis: Secondary | ICD-10-CM | POA: Diagnosis not present

## 2017-08-18 DIAGNOSIS — J3501 Chronic tonsillitis: Secondary | ICD-10-CM | POA: Diagnosis not present

## 2017-08-18 DIAGNOSIS — J323 Chronic sphenoidal sinusitis: Secondary | ICD-10-CM | POA: Diagnosis not present

## 2017-09-11 ENCOUNTER — Other Ambulatory Visit: Payer: Self-pay | Admitting: Internal Medicine

## 2017-09-11 DIAGNOSIS — I10 Essential (primary) hypertension: Secondary | ICD-10-CM

## 2017-09-16 MED ORDER — VERAPAMIL HCL ER 120 MG PO TBCR
120.0000 mg | EXTENDED_RELEASE_TABLET | Freq: Every day | ORAL | 1 refills | Status: DC
Start: 2017-09-16 — End: 2018-04-20

## 2017-09-16 NOTE — Addendum Note (Signed)
Addended by: Karle Barr on: 09/16/2017 09:28 AM   Modules accepted: Orders

## 2017-09-16 NOTE — Telephone Encounter (Signed)
Pharmacy requested capsules instead of tablets. Okay sent electronically and also faxed okay to pharmacy.

## 2017-10-06 ENCOUNTER — Encounter: Payer: Self-pay | Admitting: Internal Medicine

## 2017-10-07 ENCOUNTER — Other Ambulatory Visit: Payer: Self-pay | Admitting: Internal Medicine

## 2017-10-07 DIAGNOSIS — Z7252 High risk homosexual behavior: Secondary | ICD-10-CM

## 2017-10-07 DIAGNOSIS — I10 Essential (primary) hypertension: Secondary | ICD-10-CM

## 2017-10-07 MED ORDER — EMTRICITABINE-TENOFOVIR DF 200-300 MG PO TABS: 1.0000 | ORAL_TABLET | Freq: Every day | ORAL | 0 refills | Status: DC

## 2017-10-13 DIAGNOSIS — M9901 Segmental and somatic dysfunction of cervical region: Secondary | ICD-10-CM | POA: Diagnosis not present

## 2017-10-13 DIAGNOSIS — M545 Low back pain: Secondary | ICD-10-CM | POA: Diagnosis not present

## 2017-10-13 DIAGNOSIS — M542 Cervicalgia: Secondary | ICD-10-CM | POA: Diagnosis not present

## 2017-10-13 DIAGNOSIS — M9903 Segmental and somatic dysfunction of lumbar region: Secondary | ICD-10-CM | POA: Diagnosis not present

## 2017-10-14 ENCOUNTER — Other Ambulatory Visit: Payer: Self-pay | Admitting: Internal Medicine

## 2017-10-14 DIAGNOSIS — Z7252 High risk homosexual behavior: Secondary | ICD-10-CM

## 2017-10-18 DIAGNOSIS — J039 Acute tonsillitis, unspecified: Secondary | ICD-10-CM | POA: Diagnosis not present

## 2017-10-19 ENCOUNTER — Ambulatory Visit: Payer: BLUE CROSS/BLUE SHIELD | Admitting: Internal Medicine

## 2017-10-19 ENCOUNTER — Other Ambulatory Visit (INDEPENDENT_AMBULATORY_CARE_PROVIDER_SITE_OTHER): Payer: BLUE CROSS/BLUE SHIELD

## 2017-10-19 ENCOUNTER — Encounter: Payer: Self-pay | Admitting: Internal Medicine

## 2017-10-19 DIAGNOSIS — Z7252 High risk homosexual behavior: Secondary | ICD-10-CM | POA: Diagnosis not present

## 2017-10-19 DIAGNOSIS — J0101 Acute recurrent maxillary sinusitis: Secondary | ICD-10-CM | POA: Diagnosis not present

## 2017-10-19 DIAGNOSIS — I10 Essential (primary) hypertension: Secondary | ICD-10-CM

## 2017-10-19 LAB — BASIC METABOLIC PANEL
BUN: 10 mg/dL (ref 6–23)
CHLORIDE: 101 meq/L (ref 96–112)
CO2: 32 mEq/L (ref 19–32)
Calcium: 10 mg/dL (ref 8.4–10.5)
Creatinine, Ser: 0.96 mg/dL (ref 0.40–1.50)
GFR: 96.77 mL/min (ref >60.00)
Glucose, Bld: 88 mg/dL (ref 70–99)
POTASSIUM: 3.9 meq/L (ref 3.5–5.1)
Sodium: 139 mEq/L (ref 135–145)

## 2017-10-19 MED ORDER — DOXYCYCLINE HYCLATE 100 MG PO TABS: 100.0000 mg | ORAL_TABLET | Freq: Two times a day (BID) | ORAL | 0 refills | Status: DC

## 2017-10-19 MED ORDER — TRIAMCINOLONE ACETONIDE 0.1 % EX CREA: 1.0000 "application " | TOPICAL_CREAM | Freq: Two times a day (BID) | CUTANEOUS | 0 refills | Status: DC

## 2017-10-19 NOTE — Patient Instructions (Signed)
We have sent in the doxycycline to take 1 pill twice a day for 10 days.

## 2017-10-19 NOTE — Progress Notes (Signed)
   Subjective:    Patient ID: Alexis [name], male    DOB: 08/03/1986, 32 y.o.   MRN: [id number]  HPI The patient is a 32 YO man coming in for sinus problems. Going on since Sunday. Using tylenol sinus which is helping some. Overall worsening. Congestion, drainage, sore throat, and sinus pressure. Denies SOB but has cough with mild production. Does have asthma and no flare of that yet. He has had about 8 sinus infections last year and is contemplating sinus surgery to help resolve this. He did have extensive problems with this previously. He would like to treat this early to help him get better and be able to get his surgery.   Review of Systems  Constitutional: Positive for activity change, appetite change and chills. Negative for fatigue, fever and unexpected weight change.  HENT: Positive for congestion, ear pain, postnasal drip, rhinorrhea, sinus pressure, sinus pain and sore throat. Negative for ear discharge, sneezing, tinnitus, trouble swallowing and voice change.   Eyes: Negative.   Respiratory: Positive for cough. Negative for chest tightness, shortness of breath and wheezing.   Cardiovascular: Negative.   Gastrointestinal: Negative.   Musculoskeletal: Positive for myalgias.  Neurological: Negative.       Objective:   Physical Exam  Constitutional: He is oriented to person, place, and time. He appears well-developed and well-nourished.  HENT:  Head: Normocephalic and atraumatic.  Oropharynx with redness and clear drainage, nose with swollen turbinates, TMs normal bilaterally, tonsillar swelling without exudate, frontal sinus with pain and pressure on exam.  Eyes: EOM are normal.  Neck: Normal range of motion. No thyromegaly present.  Cardiovascular: Normal rate and regular rhythm.  Pulmonary/Chest: Effort normal and breath sounds normal. No respiratory distress. He has no wheezes. He has no rales.  Abdominal: Soft.  Musculoskeletal: He exhibits tenderness.  Lymphadenopathy:    He  has cervical adenopathy.  Neurological: He is alert and oriented to person, place, and time.  Skin: Skin is warm and dry.   Vitals:   10/19/17 1103  BP: 128/84  Pulse: (!) 110  Temp: 99.5 F (37.5 C)  TempSrc: Oral  SpO2: 99%  Weight: (!) 371 lb (168.3 kg)  Height: 6\' 3"  (1.905 m)      Assessment & Plan:

## 2017-10-19 NOTE — Assessment & Plan Note (Signed)
Rx for doxycycline for treatment given his extensive sinus history and he desire to improve to be eligible to get sinus surgery in the near future.

## 2017-10-20 ENCOUNTER — Encounter: Payer: Self-pay | Admitting: Internal Medicine

## 2017-10-20 LAB — HEPATITIS B SURFACE ANTIGEN: HEP B S AG: NONREACTIVE

## 2017-10-20 LAB — RPR: RPR Ser Ql: NONREACTIVE

## 2017-10-20 LAB — HIV ANTIBODY (ROUTINE TESTING W REFLEX): HIV 1&2 Ab, 4th Generation: NONREACTIVE

## 2017-10-27 ENCOUNTER — Encounter: Payer: Self-pay | Admitting: Internal Medicine

## 2017-10-27 DIAGNOSIS — M545 Low back pain: Secondary | ICD-10-CM | POA: Diagnosis not present

## 2017-10-27 DIAGNOSIS — M9903 Segmental and somatic dysfunction of lumbar region: Secondary | ICD-10-CM | POA: Diagnosis not present

## 2017-10-27 DIAGNOSIS — M9901 Segmental and somatic dysfunction of cervical region: Secondary | ICD-10-CM | POA: Diagnosis not present

## 2017-10-27 DIAGNOSIS — M542 Cervicalgia: Secondary | ICD-10-CM | POA: Diagnosis not present

## 2017-11-02 DIAGNOSIS — M9901 Segmental and somatic dysfunction of cervical region: Secondary | ICD-10-CM | POA: Diagnosis not present

## 2017-11-02 DIAGNOSIS — M9903 Segmental and somatic dysfunction of lumbar region: Secondary | ICD-10-CM | POA: Diagnosis not present

## 2017-11-02 DIAGNOSIS — M545 Low back pain: Secondary | ICD-10-CM | POA: Diagnosis not present

## 2017-11-02 DIAGNOSIS — M542 Cervicalgia: Secondary | ICD-10-CM | POA: Diagnosis not present

## 2017-11-05 ENCOUNTER — Other Ambulatory Visit: Payer: Self-pay | Admitting: Internal Medicine

## 2017-11-09 DIAGNOSIS — M545 Low back pain: Secondary | ICD-10-CM | POA: Diagnosis not present

## 2017-11-09 DIAGNOSIS — M9901 Segmental and somatic dysfunction of cervical region: Secondary | ICD-10-CM | POA: Diagnosis not present

## 2017-11-09 DIAGNOSIS — M9903 Segmental and somatic dysfunction of lumbar region: Secondary | ICD-10-CM | POA: Diagnosis not present

## 2017-11-09 DIAGNOSIS — M542 Cervicalgia: Secondary | ICD-10-CM | POA: Diagnosis not present

## 2017-11-12 DIAGNOSIS — M545 Low back pain: Secondary | ICD-10-CM | POA: Diagnosis not present

## 2017-11-12 DIAGNOSIS — M9903 Segmental and somatic dysfunction of lumbar region: Secondary | ICD-10-CM | POA: Diagnosis not present

## 2017-11-12 DIAGNOSIS — M542 Cervicalgia: Secondary | ICD-10-CM | POA: Diagnosis not present

## 2017-11-12 DIAGNOSIS — M9901 Segmental and somatic dysfunction of cervical region: Secondary | ICD-10-CM | POA: Diagnosis not present

## 2017-11-18 DIAGNOSIS — M545 Low back pain: Secondary | ICD-10-CM | POA: Diagnosis not present

## 2017-11-18 DIAGNOSIS — M542 Cervicalgia: Secondary | ICD-10-CM | POA: Diagnosis not present

## 2017-11-18 DIAGNOSIS — M9903 Segmental and somatic dysfunction of lumbar region: Secondary | ICD-10-CM | POA: Diagnosis not present

## 2017-11-18 DIAGNOSIS — M9901 Segmental and somatic dysfunction of cervical region: Secondary | ICD-10-CM | POA: Diagnosis not present

## 2017-11-19 ENCOUNTER — Other Ambulatory Visit: Payer: Self-pay | Admitting: Internal Medicine

## 2017-11-19 DIAGNOSIS — I10 Essential (primary) hypertension: Secondary | ICD-10-CM

## 2017-11-25 DIAGNOSIS — M9903 Segmental and somatic dysfunction of lumbar region: Secondary | ICD-10-CM | POA: Diagnosis not present

## 2017-11-25 DIAGNOSIS — M542 Cervicalgia: Secondary | ICD-10-CM | POA: Diagnosis not present

## 2017-11-25 DIAGNOSIS — M9901 Segmental and somatic dysfunction of cervical region: Secondary | ICD-10-CM | POA: Diagnosis not present

## 2017-11-25 DIAGNOSIS — M545 Low back pain: Secondary | ICD-10-CM | POA: Diagnosis not present

## 2017-11-30 DIAGNOSIS — M545 Low back pain: Secondary | ICD-10-CM | POA: Diagnosis not present

## 2017-11-30 DIAGNOSIS — M9901 Segmental and somatic dysfunction of cervical region: Secondary | ICD-10-CM | POA: Diagnosis not present

## 2017-11-30 DIAGNOSIS — M542 Cervicalgia: Secondary | ICD-10-CM | POA: Diagnosis not present

## 2017-11-30 DIAGNOSIS — M9903 Segmental and somatic dysfunction of lumbar region: Secondary | ICD-10-CM | POA: Diagnosis not present

## 2017-12-07 DIAGNOSIS — M9903 Segmental and somatic dysfunction of lumbar region: Secondary | ICD-10-CM | POA: Diagnosis not present

## 2017-12-07 DIAGNOSIS — M545 Low back pain: Secondary | ICD-10-CM | POA: Diagnosis not present

## 2017-12-07 DIAGNOSIS — M542 Cervicalgia: Secondary | ICD-10-CM | POA: Diagnosis not present

## 2017-12-07 DIAGNOSIS — M9901 Segmental and somatic dysfunction of cervical region: Secondary | ICD-10-CM | POA: Diagnosis not present

## 2017-12-13 ENCOUNTER — Telehealth: Payer: Self-pay

## 2017-12-13 NOTE — Telephone Encounter (Signed)
He is due for OV with labs and BP check

## 2017-12-13 NOTE — Telephone Encounter (Signed)
Can you call pt to schedule an appt?

## 2017-12-13 NOTE — Telephone Encounter (Signed)
[facility] sent rx request for alprazolam 1 mg tablet. Please advise.

## 2017-12-13 NOTE — Telephone Encounter (Signed)
LVM for patient to make appt

## 2017-12-16 ENCOUNTER — Ambulatory Visit: Payer: Self-pay

## 2017-12-16 NOTE — Telephone Encounter (Signed)
Patient called with c/o "diarrhea." He says "I have been having diarrhea since Monday and it is different than my IBS. I can't leave my house like this. Imodium doesn't stop it. It smells awful. I have gone 25-30 times already, about every 20-25 minutes. The color is greenish, brown. I have abdominal pain, about a 3-4 on pain scale and constant. 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 Can the doctor call something in?" According to protocol, go to ED. I advised the patient, he said "I can't leave my house like this." I asked about calling 911, he said "I can't go like this. When it hits, I have to go right then. Can you ask the doctor if he can call something in?" I advised that his stools will need to be tested, he verbalized understanding. I asked is he going to ED, he said "I will think about it." I advised this would be sent to the provider for review, her verbalized understanding.  Reason for Disposition . Patient sounds very sick or weak to the triager  Answer Assessment - Initial Assessment Questions 1. DIARRHEA SEVERITY: "How bad is the diarrhea?" "How many extra stools have you had in the past 24 hours than normal?"    - MILD: Few loose or mushy BMs; increase of 1-3 stools over normal daily number of stools; mild increase in ostomy output.   - MODERATE: Increase of 4-6 stools daily over normal; moderate increase in ostomy output.   - SEVERE (or Worst Possible): Increase of 7 or more stools daily over normal; moderate increase in ostomy output; incontinence.     25-30 stools, not stopping 2. ONSET: "When did the diarrhea begin?"      Monday 3. BM CONSISTENCY: "How loose or watery is the diarrhea?"      Watery 4. VOMITING: "Are you also vomiting?" If so, ask: "How many times in the past 24 hours?"      No 5. ABDOMINAL PAIN: "Are you having any abdominal pain?" If yes: "What does it feel like?" (e.g., crampy, dull, intermittent, constant)   Yes 6. ABDOMINAL PAIN SEVERITY: If present, ask: "How bad is the pain?"  (e.g., Scale 1-10; mild, moderate, or severe)    - MILD (1-3): doesn't interfere with normal activities, abdomen soft and not tender to touch     - MODERATE (4-7): interferes with normal activities or awakens from sleep, tender to touch     - SEVERE (8-10): excruciating pain, doubled over, unable to do any normal activities       3-4, constant 7. ORAL INTAKE: If vomiting, "Have you been able to drink liquids?" "How much fluids have you had in the past 24 hours?"     N/A 8. HYDRATION: "Any signs of dehydration?" (e.g., dry mouth [not just dry lips], too weak to stand, dizziness, new weight loss) "When did you last urinate?"     No 9. 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 OTHER SYMPTOMS: "Do you have any other symptoms?" (e.g., fever, blood in stool)       No 11. PREGNANCY: "Is there any chance you are pregnant?" "When was your last menstrual period?"       N/A  Protocols used: DIARRHEA-A-AH

## 2017-12-16 NOTE — Telephone Encounter (Signed)
Called and inform patient of this. He stated just forget and hung up.

## 2017-12-16 NOTE — Telephone Encounter (Signed)
The only other option is an office visit. PCP is not here tomorrow but can schedule with anyone here in the office.

## 2017-12-20 ENCOUNTER — Encounter: Payer: Self-pay | Admitting: Family Medicine

## 2017-12-20 ENCOUNTER — Other Ambulatory Visit: Payer: Medicaid - Dental

## 2017-12-20 ENCOUNTER — Ambulatory Visit: Payer: BLUE CROSS/BLUE SHIELD | Admitting: Family Medicine

## 2017-12-20 VITALS — BP 120/90 | HR 97 | Temp 99.1°F | Wt 371.8 lb

## 2017-12-20 DIAGNOSIS — R197 Diarrhea, unspecified: Secondary | ICD-10-CM | POA: Diagnosis not present

## 2017-12-20 MED ORDER — ONDANSETRON HCL 4 MG PO TABS: 4.0000 mg | ORAL_TABLET | Freq: Three times a day (TID) | ORAL | 0 refills | Status: DC | PRN

## 2017-12-20 NOTE — Patient Instructions (Signed)
We will call you back with the results.

## 2017-12-20 NOTE — Progress Notes (Signed)
Alexis [name] - 32 y.o. male MRN [id number]  Date of birth: 1985/12/29  SUBJECTIVE:  Including CC & ROS.  Chief Complaint  Patient presents with  . Diarrhea    started last sunday/ pt has tried taking immodium and it has not worked  . Nausea    Alexis [name] is a 32 y.o. male that is presenting with diarrhea. Has been ongoing for one week. The most bowel movements was about 20 or more. He's had 4 movements. He had a loose watery movement every time he eats. He works as a Audiological scientist. Denies any fevers. Has some upper quadrant pain but mild in nature. No symptoms of biliary colic. No travel. No vomiting. Some nausea. Non bloody but does have a history of IBS. Has been taking imodium with mild improvement.     Review of Systems  Constitutional: Negative for fever.  HENT: Negative for congestion.   Respiratory: Negative for cough.   Cardiovascular: Negative for chest pain.  Gastrointestinal: Positive for abdominal pain, diarrhea and nausea.  Musculoskeletal: Negative for gait problem.  Skin: Negative for color change.  Neurological: Negative for weakness.  Hematological: Negative for adenopathy.  Psychiatric/Behavioral: Negative for agitation.    HISTORY: Past Medical, Surgical, Social, and Family History Reviewed & Updated per EMR.   Pertinent Historical Findings include:  Past Medical History:  Diagnosis Date  . Acid reflux   . ADD 09/13/2007  . ALLERGIC RHINITIS 06/10/2009  . ASTHMA, UNSPECIFIED, UNSPECIFIED STATUS 07/19/2008  . DEPRESSIVE DISORDER 09/13/2007  . DYSMETABOLIC SYNDROME 1/66/0630  . GERD 09/13/2007  . HYPERLIPIDEMIA 10/10/2009  . HYPERTENSION 09/13/2007  . IBS 03/20/2009  . Morbid obesity ([place]) 07/19/2008  . Skin cancer     Past Surgical History:  Procedure Laterality Date  . ANKLE SURGERY  2007  . APPENDECTOMY  2005  . IR GENERIC HISTORICAL  07/23/2016   IR FLUORO GUIDED NEEDLE PLC ASPIRATION/INJECTION LOC 07/23/2016 Arne Cleveland, MD [facility]-INTERV RAD  . SKIN  CANCER EXCISION     teenager  . TEE WITHOUT CARDIOVERSION N/A 04/15/2016   Procedure: TRANSESOPHAGEAL ECHOCARDIOGRAM (TEE);  Surgeon: Sanda Klein, MD;  Location: [facility] ENDOSCOPY;  Service: Cardiovascular;  Laterality: N/A;    Allergies  Allergen Reactions  . Citrus Anaphylaxis and Other (See Comments)    Lemon  . Amoxicillin-Pot Clavulanate Nausea Only  . Sulfamethoxazole-Trimethoprim Other (See Comments)    Does not remember, long ago  . Vancomycin Other (See Comments)    Mild red mans syndrome, infuse more slowly.    Family History  Problem Relation Age of Onset  . Hypertension Father   . Diabetes Father        type 2   . Asthma Mother   . Clotting disorder Mother        Factor 5  . Rheum arthritis Mother   . Skin cancer Mother   . Emphysema Maternal Grandmother        smoker  . Asthma Maternal Grandmother   . Lung cancer Maternal Grandmother   . Allergies Unknown        whole family-both sides of family  . Asthma Paternal Grandmother   . Diabetes Paternal Grandmother        type 2  . Heart disease Unknown        MGM deceased with MI; both sides of grandparents  . Clotting disorder Maternal Uncle        Factor 5     Social History   Socioeconomic History  . Marital  status: Single    Spouse name: Not on file  . Number of children: 0  . Years of education: College  . Highest education level: Not on file  Social Needs  . Financial resource strain: Not on file  . Food insecurity - worry: Not on file  . Food insecurity - inability: Not on file  . Transportation needs - medical: Not on file  . Transportation needs - non-medical: Not on file  Occupational History  . Occupation: TEFL teacher: [employer]  Tobacco Use  . Smoking status: Never Smoker  . Smokeless tobacco: Never Used  Substance and Sexual Activity  . Alcohol use: No    Comment: socially  . Drug use: No  . Sexual activity: Not Currently  Other Topics Concern  . Not on file  Social  History Narrative   Lives at home with parents.   Works at [employer].   Single.   Caffeine use: 3-4 cups daily.      PHYSICAL EXAM:  VS: BP 120/90 (BP Location: Right Arm, Patient Position: Sitting, Cuff Size: Large)   Pulse 97   Temp 99.1 F (37.3 C) (Oral)   Wt (!) 371 lb 12.8 oz (168.6 kg)   BMI 46.47 kg/m  Physical Exam Gen: NAD, alert, cooperative with exam, well-appearing ENT: normal lips, normal nasal mucosa,  Eye: normal EOM, normal conjunctiva and lids CV:  no edema, +2 pedal pulses   Resp: no accessory muscle use, non-labored,  GI: no masses, mild tenderness in the upper quadrant, soft, normal BS, no hernia  Skin: no rashes, no areas of induration  Neuro: normal tone, normal sensation to touch Psych:  normal insight, alert and oriented MSK: normal gait, normal strength      ASSESSMENT & PLAN:   Diarrhea Possible for viral. Has not had any travel. Doubtful for inflammatory. Possible for C diff  - stool culture, gi path panel and c diff.  - zofran

## 2017-12-20 NOTE — Assessment & Plan Note (Signed)
Possible for viral. Has not had any travel. Doubtful for inflammatory. Possible for C diff  - stool culture, gi path panel and c diff.  - zofran

## 2017-12-22 ENCOUNTER — Telehealth: Payer: Self-pay | Admitting: Internal Medicine

## 2017-12-22 ENCOUNTER — Encounter: Payer: Self-pay | Admitting: Family Medicine

## 2017-12-22 NOTE — Telephone Encounter (Signed)
Patient with severe diarrhea and abdominal pain.  C-diff with primary care was negative, but the remainder of the stool studies are pending.  He is not able to eat and feels weak and miserable.  Symptoms have been present for approximately 2 weeks. He will come in and see Ellouise Newer, PA tomorrow at 3:15

## 2017-12-22 NOTE — Telephone Encounter (Signed)
Patient calling back regarding this.  °

## 2017-12-23 ENCOUNTER — Encounter: Payer: Self-pay | Admitting: Physician Assistant

## 2017-12-23 ENCOUNTER — Ambulatory Visit (INDEPENDENT_AMBULATORY_CARE_PROVIDER_SITE_OTHER): Payer: BLUE CROSS/BLUE SHIELD | Admitting: Physician Assistant

## 2017-12-23 VITALS — BP 112/64 | HR 104 | Ht 74.5 in | Wt 369.1 lb

## 2017-12-23 DIAGNOSIS — R197 Diarrhea, unspecified: Secondary | ICD-10-CM | POA: Diagnosis not present

## 2017-12-23 DIAGNOSIS — A071 Giardiasis [lambliasis]: Secondary | ICD-10-CM

## 2017-12-23 LAB — GASTROINTESTINAL PATHOGEN PANEL PCR
C. difficile Tox A/B, PCR: NOT DETECTED
Campylobacter, PCR: NOT DETECTED
Cryptosporidium, PCR: NOT DETECTED
E COLI (STEC) STX1/STX2, PCR: NOT DETECTED
E coli (ETEC) LT/ST PCR: NOT DETECTED
E coli 0157, PCR: NOT DETECTED
Giardia lamblia, PCR: DETECTED — AB
Norovirus, PCR: NOT DETECTED
ROTAVIRUS, PCR: NOT DETECTED
SALMONELLA, PCR: NOT DETECTED
SHIGELLA, PCR: NOT DETECTED

## 2017-12-23 LAB — C. DIFFICILE GDH AND TOXIN A/B
GDH ANTIGEN: NOT DETECTED
MICRO NUMBER: 90307112
SPECIMEN QUALITY:: ADEQUATE
TOXIN A AND B: NOT DETECTED

## 2017-12-23 MED ORDER — TINIDAZOLE 500 MG PO TABS: 2.0000 g | ORAL_TABLET | Freq: Every day | ORAL | 0 refills | Status: DC

## 2017-12-23 NOTE — Patient Instructions (Signed)
We have sent the following medications to your pharmacy for you to pick up at your convenience:  Tinidazole 2 grams daily for 3 days.   Try Align probiotic once daily for a month.

## 2017-12-23 NOTE — Progress Notes (Addendum)
Chief Complaint: Diarrhea  HPI:    [name] is a 32 year old Caucasian male, known to Dr. Arelia Longest, who presents to clinic today with a complaint of diarrhea.    Stool studies for C. difficile and stool culture were negative.  GI path panel was positive for Giardia.    Today, describes 2 weeks ago he acutely developed an upper abdominal pain which was excruciating right before eating at [facility] and the next morning woke up at 5 AM with nonstop diarrhea up to 40 loose liquid stools a day, for the past week has only been eating broth and Jell-O and has had a slowing of his stools but "still they run right out".  Associated with a generalized abdominal discomfort and fatigue.    Social history positive for having a 86-week-old puppy at home, though with the puppy has had no diarrhea. Also works as Public relations account executive.    Denies fever, chills, vomiting or blood in his stool.  Past Medical History:  Diagnosis Date  . Acid reflux   . ADD 09/13/2007  . ALLERGIC RHINITIS 06/10/2009  . ASTHMA, UNSPECIFIED, UNSPECIFIED STATUS 07/19/2008  . DEPRESSIVE DISORDER 09/13/2007  . DYSMETABOLIC SYNDROME 5/73/2202  . GERD 09/13/2007  . HYPERLIPIDEMIA 10/10/2009  . HYPERTENSION 09/13/2007  . IBS 03/20/2009  . Morbid obesity ([place]) 07/19/2008  . Skin cancer     Past Surgical History:  Procedure Laterality Date  . ANKLE SURGERY  2007  . APPENDECTOMY  2005  . IR GENERIC HISTORICAL  07/23/2016   IR FLUORO GUIDED NEEDLE PLC ASPIRATION/INJECTION LOC 07/23/2016 Arne Cleveland, MD [facility]-INTER[facility]  . SKIN CANCER EXCISION     teenager  . TEE WITHOUT CARDIOVERSION N/A 04/15/2016   Procedure: TRANSESOPHAGEAL ECHOCARDIOGRAM (TEE);  Surgeon: Sanda Klein, MD;  Location: [facility] ENDOSCOPY;  Service: Cardiovascular;  Laterality: N/A;    Current Outpatient Medications  Medication Sig Dispense Refill  . ALPRAZolam (XANAX) 1 MG tablet Take 1 mg by mouth 2 (two) times daily as needed for anxiety.    Marland Kitchen buPROPion (WELLBUTRIN) 75 MG tablet  Take 1 tablet (75 mg total) by mouth daily. 90 tablet 3  . CETIRIZINE HCL PO Take by mouth.    . EPINEPHrine (EPIPEN 2-PAK) 0.3 mg/0.3 mL IJ SOAJ injection Inject into the muscle once.    . irbesartan-hydrochlorothiazide (AVALIDE) 300-12.5 MG tablet TAKE 1 TABLET BY MOUTH EVERY DAY 90 tablet 0  . omeprazole (PRILOSEC) 40 MG capsule TAKE 1 CAPSULE BY MOUTH TWICE A DAY 180 capsule 1  . ondansetron (ZOFRAN) 4 MG tablet Take 1 tablet (4 mg total) by mouth every 8 (eight) hours as needed for nausea or vomiting. 30 tablet 0  . PROAIR HFA 108 (90 Base) MCG/ACT inhaler INHALE 1-2 PUFFS EVERY 4-6 HOURS AS NEEDED FOR PERSISTENT COUGH OR INCREASED WORK OF BREATHING  0  . topiramate (TOPAMAX) 50 MG tablet TAKE 1 TABLET BY MOUTH TWICE A DAY 60 tablet 3  . TRUVADA 200-300 MG tablet TAKE ONE TABLET BY MOUTH ONCE DAILY. STORE IN ORIGINAL CONTAINER AT ROOM TEMPERATURE. 90 tablet 0  . verapamil (CALAN-SR) 120 MG CR tablet Take 1 tablet (120 mg total) by mouth daily. 90 tablet 1   No current facility-administered medications for this visit.     Allergies as of 12/23/2017 - Review Complete 12/23/2017  Allergen Reaction Noted  . Citrus Anaphylaxis and Other (See Comments) 09/04/2012  . Amoxicillin-pot clavulanate Nausea Only   . Sulfamethoxazole-trimethoprim Other (See Comments)   . Vancomycin Other (See Comments) 04/11/2016  Family History  Problem Relation Age of Onset  . Hypertension Father   . Diabetes Father        type 2   . Asthma Mother   . Clotting disorder Mother        Factor 5  . Rheum arthritis Mother   . Skin cancer Mother   . Emphysema Maternal Grandmother        smoker  . Asthma Maternal Grandmother   . Lung cancer Maternal Grandmother   . Allergies Unknown        whole family-both sides of family  . Asthma Paternal Grandmother   . Diabetes Paternal Grandmother        type 2  . Heart disease Unknown        MGM deceased with MI; both sides of grandparents  . Clotting disorder  Maternal Uncle        Factor 5    Social History   Socioeconomic History  . Marital status: Single    Spouse name: Not on file  . Number of children: 0  . Years of education: College  . Highest education level: Not on file  Social Needs  . Financial resource strain: Not on file  . Food insecurity - worry: Not on file  . Food insecurity - inability: Not on file  . Transportation needs - medical: Not on file  . Transportation needs - non-medical: Not on file  Occupational History  . Occupation: TEFL teacher: [employer]  Tobacco Use  . Smoking status: Never Smoker  . Smokeless tobacco: Never Used  Substance and Sexual Activity  . Alcohol use: No    Comment: socially  . Drug use: No  . Sexual activity: Not Currently  Other Topics Concern  . Not on file  Social History Narrative   Lives at home with parents.   Works at [employer].   Single.   Caffeine use: 3-4 cups daily.     Review of Systems:    Constitutional: No weight loss, fever or chills Skin: No rash  Cardiovascular: No chest pain Respiratory: No SOB  Gastrointestinal: See HPI and otherwise negative Genitourinary: No dysuria  Neurological: No headache, dizziness or syncope Musculoskeletal: No new muscle or joint pain Hematologic: No bleeding Psychiatric: No history of depression or anxiety   Physical Exam:  Vital signs: BP 112/64 (BP Location: Left Arm, Patient Position: Sitting, Cuff Size: Normal)   Pulse (!) 104   Ht 6' 2.5" (1.892 m) Comment: height measured without shoes  Wt (!) 369 lb 2 oz (167.4 kg)   BMI 46.76 kg/m   Constitutional:   Pleasant obese Caucasian male appears to be in NAD, Well developed, Well nourished, alert and cooperative Respiratory: Respirations even and unlabored. Lungs clear to auscultation bilaterally.   No wheezes, crackles, or rhonchi.  Cardiovascular: Normal S1, S2. No MRG. Regular rate and rhythm. No peripheral edema, cyanosis or pallor.  Gastrointestinal:   Soft, nondistended, nontender. No rebound or guarding. Normal bowel sounds. No appreciable masses or hepatomegaly. Psychiatric: Demonstrates good judgement and reason without abnormal affect or behaviors.  MOST RECENT LABS AND IMAGING: CBC    Component Value Date/Time   WBC 8.8 07/06/2017 0919   RBC 5.60 07/06/2017 0919   HGB 14.0 07/06/2017 0919   HGB 14.0 08/25/2016 1559   HCT 45.3 07/06/2017 0919   HCT 42.0 08/25/2016 1559   PLT 197 07/06/2017 0919   PLT 268 08/25/2016 1559   MCV 80.9 07/06/2017 0919  MCV 79 08/25/2016 1559   MCH 25.0 (L) 07/06/2017 0919   MCHC 30.9 07/06/2017 0919   RDW 15.8 (H) 07/06/2017 0919   RDW 14.6 08/25/2016 1559   LYMPHSABS 2.4 07/06/2017 0919   LYMPHSABS 2.6 08/25/2016 1559   MONOABS 0.8 07/06/2017 0919   EOSABS 0.0 07/06/2017 0919   EOSABS 0.2 08/25/2016 1559   BASOSABS 0.1 07/06/2017 0919   BASOSABS 0.0 08/25/2016 1559    CMP     Component Value Date/Time   NA 139 10/19/2017 1122   K 3.9 10/19/2017 1122   CL 101 10/19/2017 1122   CO2 32 10/19/2017 1122   GLUCOSE 88 10/19/2017 1122   BUN 10 10/19/2017 1122   CREATININE 0.96 10/19/2017 1122   CREATININE 0.95 05/01/2016 1217   CALCIUM 10.0 10/19/2017 1122   PROT 7.5 09/17/2016 1655   ALBUMIN 3.8 09/17/2016 1655   AST 25 09/17/2016 1655   ALT 32 09/17/2016 1655   ALKPHOS 59 09/17/2016 1655   BILITOT 0.5 09/17/2016 1655   GFRNONAA >60 07/06/2017 0919   GFRAA >60 07/06/2017 0919    Assessment: 1.  Diarrhea: 2 weeks of 40+ stools a day, Giardia positive; likely this is the cause.  Plan: 1.  Prescribed Tinidazole 2 g PO daily for 3 days 2.  Would recommend patient start a daily probiotic such as Align once daily, provided him with a coupon. 3.  Patient to call and let us know if the above medicine does not help.  If it does not we will need to consider colonoscopy and likely provide patient with Lomotil.  Ellouise Newer, PA-C [facility] 12/23/2017, 3:23 PM  Cc:  Janith Lima, MD   Agree with Ms. Mort Sawyers evaluation and management.  Gatha Mayer, MD, [facility]

## 2017-12-23 NOTE — Telephone Encounter (Signed)
Spoke with patient about his results. He has medication called in already.    Rosemarie Ax, MD [facility] 12/23/2017, 4:13 PM

## 2017-12-24 LAB — STOOL CULTURE
MICRO NUMBER: 90306952
MICRO NUMBER:: 90306950
MICRO NUMBER:: 90306951
SHIGA RESULT:: NOT DETECTED
SPECIMEN QUALITY: ADEQUATE
SPECIMEN QUALITY: ADEQUATE
SPECIMEN QUALITY:: ADEQUATE

## 2017-12-27 ENCOUNTER — Telehealth: Payer: Self-pay | Admitting: Family Medicine

## 2017-12-27 NOTE — Telephone Encounter (Signed)
Left VM for patient. If he calls back please have him speak with a nurse/CMA and inform that his stool culture was normal.   If any questions then please take the best time and phone number to call and I will try to call him back.   Rosemarie Ax, MD [facility] 12/27/2017, 1:17 PM

## 2017-12-28 DIAGNOSIS — M9903 Segmental and somatic dysfunction of lumbar region: Secondary | ICD-10-CM | POA: Diagnosis not present

## 2017-12-28 DIAGNOSIS — M545 Low back pain: Secondary | ICD-10-CM | POA: Diagnosis not present

## 2017-12-28 DIAGNOSIS — M9901 Segmental and somatic dysfunction of cervical region: Secondary | ICD-10-CM | POA: Diagnosis not present

## 2017-12-28 DIAGNOSIS — M542 Cervicalgia: Secondary | ICD-10-CM | POA: Diagnosis not present

## 2018-01-04 DIAGNOSIS — M542 Cervicalgia: Secondary | ICD-10-CM | POA: Diagnosis not present

## 2018-01-04 DIAGNOSIS — M545 Low back pain: Secondary | ICD-10-CM | POA: Diagnosis not present

## 2018-01-04 DIAGNOSIS — M9901 Segmental and somatic dysfunction of cervical region: Secondary | ICD-10-CM | POA: Diagnosis not present

## 2018-01-04 DIAGNOSIS — M9903 Segmental and somatic dysfunction of lumbar region: Secondary | ICD-10-CM | POA: Diagnosis not present

## 2018-01-07 ENCOUNTER — Ambulatory Visit: Payer: Self-pay

## 2018-01-10 DIAGNOSIS — M722 Plantar fascial fibromatosis: Secondary | ICD-10-CM | POA: Diagnosis not present

## 2018-01-10 DIAGNOSIS — M79671 Pain in right foot: Secondary | ICD-10-CM | POA: Diagnosis not present

## 2018-01-10 DIAGNOSIS — M6701 Short Achilles tendon (acquired), right ankle: Secondary | ICD-10-CM | POA: Diagnosis not present

## 2018-01-18 DIAGNOSIS — M542 Cervicalgia: Secondary | ICD-10-CM | POA: Diagnosis not present

## 2018-01-18 DIAGNOSIS — M9901 Segmental and somatic dysfunction of cervical region: Secondary | ICD-10-CM | POA: Diagnosis not present

## 2018-01-18 DIAGNOSIS — M9903 Segmental and somatic dysfunction of lumbar region: Secondary | ICD-10-CM | POA: Diagnosis not present

## 2018-01-18 DIAGNOSIS — M545 Low back pain: Secondary | ICD-10-CM | POA: Diagnosis not present

## 2018-01-25 DIAGNOSIS — M545 Low back pain: Secondary | ICD-10-CM | POA: Diagnosis not present

## 2018-01-25 DIAGNOSIS — M9901 Segmental and somatic dysfunction of cervical region: Secondary | ICD-10-CM | POA: Diagnosis not present

## 2018-01-25 DIAGNOSIS — M542 Cervicalgia: Secondary | ICD-10-CM | POA: Diagnosis not present

## 2018-01-25 DIAGNOSIS — M9903 Segmental and somatic dysfunction of lumbar region: Secondary | ICD-10-CM | POA: Diagnosis not present

## 2018-02-01 DIAGNOSIS — M9903 Segmental and somatic dysfunction of lumbar region: Secondary | ICD-10-CM | POA: Diagnosis not present

## 2018-02-01 DIAGNOSIS — M542 Cervicalgia: Secondary | ICD-10-CM | POA: Diagnosis not present

## 2018-02-01 DIAGNOSIS — M9901 Segmental and somatic dysfunction of cervical region: Secondary | ICD-10-CM | POA: Diagnosis not present

## 2018-02-01 DIAGNOSIS — M545 Low back pain: Secondary | ICD-10-CM | POA: Diagnosis not present

## 2018-02-02 ENCOUNTER — Ambulatory Visit (INDEPENDENT_AMBULATORY_CARE_PROVIDER_SITE_OTHER): Payer: BLUE CROSS/BLUE SHIELD | Admitting: Internal Medicine

## 2018-02-02 ENCOUNTER — Other Ambulatory Visit (INDEPENDENT_AMBULATORY_CARE_PROVIDER_SITE_OTHER): Payer: BLUE CROSS/BLUE SHIELD

## 2018-02-02 ENCOUNTER — Encounter: Payer: Self-pay | Admitting: Internal Medicine

## 2018-02-02 VITALS — BP 140/88 | HR 90 | Temp 99.0°F | Resp 16 | Ht 74.5 in | Wt 383.0 lb

## 2018-02-02 DIAGNOSIS — F411 Generalized anxiety disorder: Secondary | ICD-10-CM | POA: Diagnosis not present

## 2018-02-02 DIAGNOSIS — Z23 Encounter for immunization: Secondary | ICD-10-CM | POA: Diagnosis not present

## 2018-02-02 DIAGNOSIS — Z7252 High risk homosexual behavior: Secondary | ICD-10-CM

## 2018-02-02 DIAGNOSIS — I10 Essential (primary) hypertension: Secondary | ICD-10-CM

## 2018-02-02 DIAGNOSIS — E785 Hyperlipidemia, unspecified: Secondary | ICD-10-CM

## 2018-02-02 LAB — BASIC METABOLIC PANEL
BUN: 11 mg/dL (ref 6–23)
CO2: 31 meq/L (ref 19–32)
CREATININE: 1.01 mg/dL (ref 0.40–1.50)
Calcium: 9.1 mg/dL (ref 8.4–10.5)
Chloride: 100 mEq/L (ref 96–112)
GFR: 91.1 mL/min (ref >60.00)
GLUCOSE: 91 mg/dL (ref 70–99)
Potassium: 3.8 mEq/L (ref 3.5–5.1)
Sodium: 136 mEq/L (ref 135–145)

## 2018-02-02 LAB — LIPID PANEL
CHOL/HDL RATIO: 4
Cholesterol: 163 mg/dL (ref 0–200)
HDL: 44.3 mg/dL (ref >39.00)
LDL Cholesterol: 89 mg/dL (ref 0–99)
NonHDL: 118.68
TRIGLYCERIDES: 147 mg/dL (ref 0.0–149.0)
VLDL: 29.4 mg/dL (ref 0.0–40.0)

## 2018-02-02 MED ORDER — ALPRAZOLAM 1 MG PO TABS: 1.0000 mg | ORAL_TABLET | Freq: Two times a day (BID) | ORAL | 3 refills | Status: AC | PRN

## 2018-02-02 NOTE — Patient Instructions (Signed)

## 2018-02-02 NOTE — Progress Notes (Signed)
Subjective:  Patient ID: Alexis [name], male    DOB: 21-Jul-1986  Age: 32 y.o. MRN: [id number]  CC: Hypertension   HPI Alexis [name] presents for f/up - He tells me his blood pressure has been well controlled.  He denies any recent episodes of headache, chest pain, or shortness of breath.  He wants to continue taking Truvada.  He has not recently had any episodes of rash, sore throat, dysuria, penile ulcers, penile discharge, or testicular pain or swelling.  He complains of intermittent anxiety and panicky feeling and wants a refill on Xanax.  Outpatient Medications Prior to Visit  Medication Sig Dispense Refill  . buPROPion (WELLBUTRIN) 75 MG tablet Take 1 tablet (75 mg total) by mouth daily. 90 tablet 3  . CETIRIZINE HCL PO Take by mouth.    . EPINEPHrine (EPIPEN 2-PAK) 0.3 mg/0.3 mL IJ SOAJ injection Inject into the muscle once.    . irbesartan-hydrochlorothiazide (AVALIDE) 300-12.5 MG tablet TAKE 1 TABLET BY MOUTH EVERY DAY 90 tablet 0  . omeprazole (PRILOSEC) 40 MG capsule TAKE 1 CAPSULE BY MOUTH TWICE A DAY 180 capsule 1  . prednisoLONE acetate (PRED FORTE) 1 % ophthalmic suspension prednisolone acetate 1 % eye drops,suspension    . PROAIR HFA 108 (90 Base) MCG/ACT inhaler INHALE 1-2 PUFFS EVERY 4-6 HOURS AS NEEDED FOR PERSISTENT COUGH OR INCREASED WORK OF BREATHING  0  . topiramate (TOPAMAX) 50 MG tablet TAKE 1 TABLET BY MOUTH TWICE A DAY 60 tablet 3  . verapamil (CALAN-SR) 120 MG CR tablet Take 1 tablet (120 mg total) by mouth daily. 90 tablet 1  . ALPRAZolam (XANAX) 1 MG tablet Take 1 mg by mouth 2 (two) times daily as needed for anxiety.    . ondansetron (ZOFRAN) 4 MG tablet Take 1 tablet (4 mg total) by mouth every 8 (eight) hours as needed for nausea or vomiting. 30 tablet 0  . tinidazole (TINDAMAX) 500 MG tablet Take 4 tablets (2,000 mg total) by mouth daily with breakfast. 12 tablet 0  . TRUVADA 200-300 MG tablet TAKE ONE TABLET BY MOUTH ONCE DAILY. STORE IN ORIGINAL CONTAINER  AT ROOM TEMPERATURE. 90 tablet 0   No facility-administered medications prior to visit.     ROS Review of Systems  Constitutional: Negative for chills, diaphoresis, fatigue and fever.  HENT: Negative.  Negative for sore throat, trouble swallowing and voice change.   Respiratory: Negative for cough, chest tightness, shortness of breath and wheezing.   Cardiovascular: Negative for chest pain, palpitations and leg swelling.  Gastrointestinal: Negative for abdominal pain, constipation, diarrhea, nausea and vomiting.  Endocrine: Negative.   Genitourinary: Negative.  Negative for difficulty urinating, discharge, dysuria, genital sores, scrotal swelling and testicular pain.  Musculoskeletal: Negative.  Negative for arthralgias and myalgias.  Skin: Negative.  Negative for color change, pallor and rash.  Neurological: Negative.  Negative for dizziness, weakness and light-headedness.  Hematological: Negative for adenopathy. Does not bruise/bleed easily.  Psychiatric/Behavioral: Negative for decreased concentration, dysphoric mood, sleep disturbance and suicidal ideas. The patient is nervous/anxious.     Objective:  BP 140/88 (BP Location: Left Arm, Patient Position: Sitting, Cuff Size: Large)   Pulse 90   Temp 99 F (37.2 C) (Oral)   Resp 16   Ht 6' 2.5" (1.892 m)   Wt (!) 383 lb (173.7 kg)   SpO2 97%   BMI 48.52 kg/m   BP Readings from Last 3 Encounters:  02/02/18 140/88  12/23/17 112/64  12/20/17 120/90  Wt Readings from Last 3 Encounters:  02/02/18 (!) 383 lb (173.7 kg)  12/23/17 (!) 369 lb 2 oz (167.4 kg)  12/20/17 (!) 371 lb 12.8 oz (168.6 kg)    Physical Exam  Constitutional: He is oriented to person, place, and time. No distress.  HENT:  Mouth/Throat: Oropharynx is clear and moist. No oropharyngeal exudate.  Eyes: Conjunctivae are normal. No scleral icterus.  Neck: Normal range of motion. Neck supple. No JVD present. No thyromegaly present.  Cardiovascular: Normal  rate, regular rhythm and normal heart sounds. Exam reveals no gallop and no friction rub.  No murmur heard. Pulmonary/Chest: Effort normal and breath sounds normal. No stridor. No respiratory distress. He has no wheezes. He has no rales.  Abdominal: Soft. Bowel sounds are normal. He exhibits no mass. There is no tenderness.  Musculoskeletal: Normal range of motion. He exhibits no edema or deformity.  Lymphadenopathy:    He has no cervical adenopathy.  Neurological: He is alert and oriented to person, place, and time.  Skin: Skin is warm and dry. He is not diaphoretic.  Psychiatric: He has a normal mood and affect. His behavior is normal. Judgment and thought content normal.    Lab Results  Component Value Date   WBC 8.8 07/06/2017   HGB 14.0 07/06/2017   HCT 45.3 07/06/2017   PLT 197 07/06/2017   GLUCOSE 91 02/02/2018   CHOL 163 02/02/2018   TRIG 147.0 02/02/2018   HDL 44.30 02/02/2018   LDLCALC 89 02/02/2018   ALT 32 09/17/2016   AST 25 09/17/2016   NA 136 02/02/2018   K 3.8 02/02/2018   CL 100 02/02/2018   CREATININE 1.01 02/02/2018   BUN 11 02/02/2018   CO2 31 02/02/2018   TSH 1.970 08/25/2016   HGBA1C 5.5 03/08/2007    Dg Chest 2 View  Result Date: 07/06/2017 CLINICAL DATA:  Chest pain, shortness of breath. EXAM: CHEST  2 VIEW COMPARISON:  Radiographs of April 22, 2016. FINDINGS: The heart size and mediastinal contours are within normal limits. Both lungs are clear. No pneumothorax or pleural effusion is noted. The visualized skeletal structures are unremarkable. IMPRESSION: No active cardiopulmonary disease. Electronically Signed   By: Marijo Conception, M.D.   On: 07/06/2017 10:23    Assessment & Plan:   Alexis [name] was seen today for hypertension.  Diagnoses and all orders for this visit:  High risk homosexual behavior- His creatinine clearance is above 60 and screening for hepatitis B and HIV are both negative.  He can continue taking Truvada. -     Hepatitis B surface  antigen; Future -     HIV antibody; Future -     emtricitabine-tenofovir (TRUVADA) 200-300 MG tablet; TAKE ONE TABLET BY MOUTH ONCE DAILY. STORE IN ORIGINAL CONTAINER AT ROOM TEMPERATURE.  Hyperlipidemia LDL goal <130- His LDL is normal.  Statin therapy is not indicated. -     Lipid panel; Future  Essential hypertension- His blood pressure is well controlled.  Electrolytes and renal function are normal. -     Basic metabolic panel; Future  Need for hepatitis A immunization -     Hepatitis A vaccine adult IM  GAD (generalized anxiety disorder) -     ALPRAZolam (XANAX) 1 MG tablet; Take 1 tablet (1 mg total) by mouth 2 (two) times daily as needed for anxiety.   I have discontinued Alexis Cirri. [place] "[name]"'s ondansetron and tinidazole. I have changed his TRUVADA to emtricitabine-tenofovir. I have also changed his ALPRAZolam. Additionally, I  am having him maintain his PROAIR HFA, CETIRIZINE HCL PO, EPINEPHrine, topiramate, omeprazole, verapamil, buPROPion, irbesartan-hydrochlorothiazide, and prednisoLONE acetate.  Meds ordered this encounter  Medications  . ALPRAZolam (XANAX) 1 MG tablet    Sig: Take 1 tablet (1 mg total) by mouth 2 (two) times daily as needed for anxiety.    Dispense:  30 tablet    Refill:  3  . emtricitabine-tenofovir (TRUVADA) 200-300 MG tablet    Sig: TAKE ONE TABLET BY MOUTH ONCE DAILY. STORE IN ORIGINAL CONTAINER AT ROOM TEMPERATURE.    Dispense:  90 tablet    Refill:  1     Follow-up: Return in about 6 months (around 08/04/2018).  Scarlette Calico, MD

## 2018-02-03 ENCOUNTER — Encounter: Payer: Self-pay | Admitting: Internal Medicine

## 2018-02-03 LAB — HIV ANTIBODY (ROUTINE TESTING W REFLEX): HIV 1&2 Ab, 4th Generation: NONREACTIVE

## 2018-02-03 LAB — HEPATITIS B SURFACE ANTIGEN: Hepatitis B Surface Ag: NONREACTIVE

## 2018-02-03 MED ORDER — EMTRICITABINE-TENOFOVIR DF 200-300 MG PO TABS: ORAL_TABLET | ORAL | 1 refills | Status: DC

## 2018-02-08 ENCOUNTER — Ambulatory Visit
Admission: RE | Admit: 2018-02-08 | Discharge: 2018-02-08 | Disposition: A | Discharge status: Home / Self Care | Payer: BLUE CROSS/BLUE SHIELD | Source: Ambulatory Visit | Attending: Otolaryngology | Admitting: Otolaryngology

## 2018-02-08 ENCOUNTER — Other Ambulatory Visit: Payer: Self-pay | Admitting: Otolaryngology

## 2018-02-08 DIAGNOSIS — J0100 Acute maxillary sinusitis, unspecified: Secondary | ICD-10-CM | POA: Diagnosis not present

## 2018-02-08 DIAGNOSIS — J320 Chronic maxillary sinusitis: Secondary | ICD-10-CM

## 2018-02-11 ENCOUNTER — Other Ambulatory Visit: Payer: Self-pay | Admitting: Internal Medicine

## 2018-02-11 DIAGNOSIS — Z7252 High risk homosexual behavior: Secondary | ICD-10-CM

## 2018-02-15 DIAGNOSIS — M9901 Segmental and somatic dysfunction of cervical region: Secondary | ICD-10-CM | POA: Diagnosis not present

## 2018-02-15 DIAGNOSIS — M542 Cervicalgia: Secondary | ICD-10-CM | POA: Diagnosis not present

## 2018-02-15 DIAGNOSIS — M9903 Segmental and somatic dysfunction of lumbar region: Secondary | ICD-10-CM | POA: Diagnosis not present

## 2018-02-15 DIAGNOSIS — M545 Low back pain: Secondary | ICD-10-CM | POA: Diagnosis not present

## 2018-02-22 DIAGNOSIS — M542 Cervicalgia: Secondary | ICD-10-CM | POA: Diagnosis not present

## 2018-02-22 DIAGNOSIS — M545 Low back pain: Secondary | ICD-10-CM | POA: Diagnosis not present

## 2018-02-22 DIAGNOSIS — M9903 Segmental and somatic dysfunction of lumbar region: Secondary | ICD-10-CM | POA: Diagnosis not present

## 2018-02-22 DIAGNOSIS — M9901 Segmental and somatic dysfunction of cervical region: Secondary | ICD-10-CM | POA: Diagnosis not present

## 2018-02-23 ENCOUNTER — Other Ambulatory Visit: Payer: Self-pay | Admitting: Internal Medicine

## 2018-02-23 DIAGNOSIS — J320 Chronic maxillary sinusitis: Secondary | ICD-10-CM | POA: Diagnosis not present

## 2018-02-23 DIAGNOSIS — J040 Acute laryngitis: Secondary | ICD-10-CM | POA: Diagnosis not present

## 2018-02-23 DIAGNOSIS — J322 Chronic ethmoidal sinusitis: Secondary | ICD-10-CM | POA: Diagnosis not present

## 2018-02-23 DIAGNOSIS — J301 Allergic rhinitis due to pollen: Secondary | ICD-10-CM | POA: Diagnosis not present

## 2018-02-23 DIAGNOSIS — I10 Essential (primary) hypertension: Secondary | ICD-10-CM

## 2018-03-01 DIAGNOSIS — M9903 Segmental and somatic dysfunction of lumbar region: Secondary | ICD-10-CM | POA: Diagnosis not present

## 2018-03-01 DIAGNOSIS — M542 Cervicalgia: Secondary | ICD-10-CM | POA: Diagnosis not present

## 2018-03-01 DIAGNOSIS — M9901 Segmental and somatic dysfunction of cervical region: Secondary | ICD-10-CM | POA: Diagnosis not present

## 2018-03-01 DIAGNOSIS — M545 Low back pain: Secondary | ICD-10-CM | POA: Diagnosis not present

## 2018-04-20 ENCOUNTER — Other Ambulatory Visit: Payer: Self-pay | Admitting: Internal Medicine

## 2018-04-20 DIAGNOSIS — K209 Esophagitis, unspecified without bleeding: Secondary | ICD-10-CM

## 2018-04-20 DIAGNOSIS — I10 Essential (primary) hypertension: Secondary | ICD-10-CM

## 2018-05-06 ENCOUNTER — Encounter: Payer: Self-pay | Admitting: Nurse Practitioner

## 2018-05-06 ENCOUNTER — Ambulatory Visit: Payer: Self-pay

## 2018-05-06 ENCOUNTER — Other Ambulatory Visit (INDEPENDENT_AMBULATORY_CARE_PROVIDER_SITE_OTHER): Payer: BLUE CROSS/BLUE SHIELD

## 2018-05-06 ENCOUNTER — Ambulatory Visit (INDEPENDENT_AMBULATORY_CARE_PROVIDER_SITE_OTHER)
Admission: RE | Admit: 2018-05-06 | Discharge: 2018-05-06 | Disposition: A | Discharge status: Home / Self Care | Payer: BLUE CROSS/BLUE SHIELD | Source: Ambulatory Visit | Attending: Nurse Practitioner | Admitting: Nurse Practitioner

## 2018-05-06 ENCOUNTER — Ambulatory Visit (INDEPENDENT_AMBULATORY_CARE_PROVIDER_SITE_OTHER): Payer: BLUE CROSS/BLUE SHIELD | Admitting: Nurse Practitioner

## 2018-05-06 VITALS — BP 132/92 | HR 94 | Temp 98.9°F | Resp 20 | Ht 74.5 in | Wt >= 6400 oz

## 2018-05-06 DIAGNOSIS — Z7252 High risk homosexual behavior: Secondary | ICD-10-CM

## 2018-05-06 DIAGNOSIS — M7989 Other specified soft tissue disorders: Secondary | ICD-10-CM

## 2018-05-06 DIAGNOSIS — R0602 Shortness of breath: Secondary | ICD-10-CM | POA: Diagnosis not present

## 2018-05-06 DIAGNOSIS — R05 Cough: Secondary | ICD-10-CM

## 2018-05-06 DIAGNOSIS — I10 Essential (primary) hypertension: Secondary | ICD-10-CM

## 2018-05-06 DIAGNOSIS — R059 Cough, unspecified: Secondary | ICD-10-CM

## 2018-05-06 LAB — BASIC METABOLIC PANEL
BUN: 11 mg/dL (ref 6–23)
CALCIUM: 9 mg/dL (ref 8.4–10.5)
CO2: 32 mEq/L (ref 19–32)
Chloride: 102 mEq/L (ref 96–112)
Creatinine, Ser: 1.06 mg/dL (ref 0.40–1.50)
GFR: 86.02 mL/min (ref >60.00)
Glucose, Bld: 97 mg/dL (ref 70–99)
Potassium: 3.9 mEq/L (ref 3.5–5.1)
SODIUM: 139 meq/L (ref 135–145)

## 2018-05-06 LAB — BRAIN NATRIURETIC PEPTIDE: PRO B NATRI PEPTIDE: 18 pg/mL (ref 0.0–100.0)

## 2018-05-06 LAB — CBC
HCT: 41.8 % (ref 39.0–52.0)
HEMOGLOBIN: 13.7 g/dL (ref 13.0–17.0)
MCHC: 32.8 g/dL (ref 30.0–36.0)
MCV: 80 fl (ref 78.0–100.0)
Platelets: 217 10*3/uL (ref 150.0–400.0)
RBC: 5.23 Mil/uL (ref 4.22–5.81)
RDW: 15 % (ref 11.5–15.5)
WBC: 6.2 10*3/uL (ref 4.0–10.5)

## 2018-05-06 MED ORDER — AZITHROMYCIN 250 MG PO TABS
ORAL_TABLET | ORAL | 0 refills | Status: DC
Start: 2018-05-06 — End: 2018-11-15

## 2018-05-06 MED ORDER — FUROSEMIDE 20 MG PO TABS: 20.0000 mg | ORAL_TABLET | Freq: Every day | ORAL | 0 refills | Status: DC

## 2018-05-06 NOTE — Telephone Encounter (Signed)
Pt. Reports he started coughing yesterday - productive with green mucus. Coughed all night. Some nasal congestion. No fever. Appointment made for today. No availability with Dr. Ronnald Ramp. Reason for Disposition . [1] Continuous (nonstop) coughing interferes with work or school AND [2] no improvement using cough treatment per Care Advice  Answer Assessment - Initial Assessment Questions 1. ONSET: "When did the cough begin?"      Started yesterday 2. SEVERITY: "How bad is the cough today?"      Severe 3. RESPIRATORY DISTRESS: "Describe your breathing."      No distress 4. FEVER: "Do you have a fever?" If so, ask: "What is your temperature, how was it measured, and when did it start?"     No 5. SPUTUM: "Describe the color of your sputum" (clear, white, yellow, green)     Green 6. HEMOPTYSIS: "Are you coughing up any blood?" If so ask: "How much?" (flecks, streaks, tablespoons, etc.)     No 7. CARDIAC HISTORY: "Do you have any history of heart disease?" (e.g., heart attack, congestive heart failure)      HTN 8. LUNG HISTORY: "Do you have any history of lung disease?"  (e.g., pulmonary embolus, asthma, emphysema)     Flare -up of asthma at times 9. PE RISK FACTORS: "Do you have a history of blood clots?" (or: recent major surgery, recent prolonged travel, bedridden)     No 10. OTHER SYMPTOMS: "Do you have any other symptoms?" (e.g., runny nose, wheezing, chest pain)       Nasal congestion 11. PREGNANCY: "Is there any chance you are pregnant?" "When was your last menstrual period?"       n/a 12. TRAVEL: "Have you traveled out of the country in the last month?" (e.g., travel history, exposures)       No  Protocols used: [place]

## 2018-05-06 NOTE — Progress Notes (Signed)
Name: Alexis [name]   MRN: [id number]    DOB: 1985/12/05   Date:05/06/2018       Progress Note  Subjective  Chief Complaint  Chief Complaint  Patient presents with  . Cough    productive cough x3 days    HPI  Alexis [name] is here today for evaluation of acute cough/cold symptoms and leg swelling. He would also like to have his routine labs drawn for truvada monitoring today. His symptoms began about 3 days ago, when he woke with productive cough with green sputum Since then he's noticed headaches, hoarse voice, shortness of breath, and just not feeling well. He became more concerned yesterday when he woke with pitting edema to BLE and his legs feel sore, which he has never noticed in the past, and says today his weight is higher than it has ever been. He has a history of hypertension, maintained on irbesartan-HCTZ 300-12.5 daily as prescribed. 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 He denies fevers, palpitations, tachycardia, abdominal pain, nausea, vomiting He reports taking his truvada daily as prescribed, no missed doses, would like to continue.  BP Readings from Last 3 Encounters:  05/06/18 (!) 132/92  02/02/18 140/88  12/23/17 112/64   Wt Readings from Last 3 Encounters:  05/06/18 (!) 411 lb 12.8 oz (186.8 kg)  02/02/18 (!) 383 lb (173.7 kg)  12/23/17 (!) 369 lb 2 oz (167.4 kg)    Patient Active Problem List   Diagnosis Date Noted  . High risk homosexual behavior 05/19/2017  . 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 Hyperlipidemia LDL goal <130 10/10/2009  . Morbid obesity ([place]) 07/19/2008  . Mild persistent asthma 07/19/2008  . GAD (generalized anxiety disorder) 09/13/2007  . Essential hypertension 09/13/2007  . GERD (gastroesophageal reflux disease) 09/13/2007    Social History   Tobacco Use  . Smoking status: Never Smoker  . Smokeless  tobacco: Never Used  Substance Use Topics  . Alcohol use: No    Comment: socially     Current Outpatient Medications:  .  ALPRAZolam (XANAX) 1 MG tablet, Take 1 tablet (1 mg total) by mouth 2 (two) times daily as needed for anxiety., Disp: 30 tablet, Rfl: 3 .  buPROPion (WELLBUTRIN) 75 MG tablet, Take 1 tablet (75 mg total) by mouth daily., Disp: 90 tablet, Rfl: 3 .  CETIRIZINE HCL PO, Take by mouth., Disp: , Rfl:  .  emtricitabine-tenofovir (TRUVADA) 200-300 MG tablet, TAKE ONE TABLET BY MOUTH ONCE DAILY WITH OR WITHOUT FOOD. STORE IN ORIGINAL CONTAINER AT ROOM TEMPERATURE., Disp: 90 tablet, Rfl: 1 .  EPINEPHrine (EPIPEN 2-PAK) 0.3 mg/0.3 mL IJ SOAJ injection, Inject into the muscle once., Disp: , Rfl:  .  irbesartan-hydrochlorothiazide (AVALIDE) 300-12.5 MG tablet, Take 1 tablet by mouth daily., Disp: 90 tablet, Rfl: 1 .  omeprazole (PRILOSEC) 40 MG capsule, TAKE 1 CAPSULE BY MOUTH TWICE A DAY, Disp: 180 capsule, Rfl: 1 .  prednisoLONE acetate (PRED FORTE) 1 % ophthalmic suspension, prednisolone acetate 1 % eye drops,suspension, Disp: , Rfl:  .  PROAIR HFA 108 (90 Base) MCG/ACT inhaler, INHALE 1-2 PUFFS EVERY 4-6 HOURS AS NEEDED FOR PERSISTENT COUGH OR INCREASED WORK OF BREATHING, Disp: , Rfl: 0 .  topiramate (TOPAMAX) 50 MG tablet, TAKE 1 TABLET BY MOUTH TWICE A DAY, Disp: 60 tablet, Rfl: 3 .  verapamil (VERELAN PM) 120 MG 24 hr capsule, TAKE 1  CAPSULE BY MOUTH EVERY DAY, Disp: 90 capsule, Rfl: 1  Allergies  Allergen Reactions  . Citrus Anaphylaxis and Other (See Comments)    Lemon  . Amoxicillin-Pot Clavulanate Nausea Only  . Sulfamethoxazole-Trimethoprim Other (See Comments)    Does not remember, long ago  . Vancomycin Other (See Comments)    Mild red mans syndrome, infuse more slowly.    ROS  No other specific complaints in a complete review of systems (except as listed in HPI above).  Objective  Vitals:   05/06/18 1555  BP: (!) 132/92  Pulse: 94  Resp: 20  Temp: 98.9  F (37.2 C)  TempSrc: Oral  SpO2: 97%  Weight: (!) 411 lb 12.8 oz (186.8 kg)  Height: 6' 2.5" (1.892 m)    Body mass index is 52.16 kg/m.  Nursing Note and Vital Signs reviewed.  Physical Exam  Constitutional: Patient appears well-developed and well-nourished. No distress.  HEENT: head atraumatic, normocephalic, pupils equal and reactive to light, EOM's intact, TM's without erythema or bulging, no maxillary or frontal sinus tenderness , neck supple without lymphadenopathy, oropharynx pink and moist without exudate Cardiovascular: Normal rate, regular rhythm, S1/S2 present.  No murmur or rub heard. 1+ pitting edema to BLE. Distal pulses intact. Pulmonary/Chest: Effort normal and breath sounds clear. No respiratory distress or retractions. Abdominal: Soft. No distension. Neurological: He is alert and oriented to person, place, and time. No cranial nerve deficit. Coordination, balance, strength, speech and gait are normal.  Skin: Skin is warm and dry. No rash noted. No erythema.  Psychiatric: Patient has a normal mood and affect. behavior is normal. Judgment and thought content normal.   Assessment & Plan RTC in 1 week for F/U: HTN- recheck BP, Leg swelling- starting lasix, check BMET  -Red flags and when to present for emergency care or RTC including fever >101.42F, chest pain, shortness of breath, new/worsening/un-resolving symptoms, reviewed with patient at time of visit. Follow up and care instructions discussed and provided in AVS.  Cough Duration of 3 days, could be viral at this point which we discussed Printed antibiotic rx provided to patient and he will practice watchful waiting, initiate antibiotic course if symptoms persist or worsen beyond 7 days Additional labs and diagnostic testing ordered today F/U with further recommendations pending lab results - DG Chest 2 View; Future - CBC; Future - B Nat Peptide; Future - furosemide (LASIX) 20 MG tablet; Take 1 tablet (20 mg  total) by mouth daily.  Dispense: 30 tablet; Refill: 0 - azithromycin (ZITHROMAX) 250 MG tablet; Take 2 tablets on day 1, then 1 tablet days 2-5.  Dispense: 6 tablet; Refill: 0  Leg swelling Will Rx course of lasix for edema- dosing and side effects discussed Update labs F/U with further recommendations pending lab results RTC in 1 week for F/U, repeat BMET for lasix - DG Chest 2 View; Future - CBC; Future - B Nat Peptide; Future - furosemide (LASIX) 20 MG tablet; Take 1 tablet (20 mg total) by mouth daily.  Dispense: 30 tablet; Refill: 0

## 2018-05-06 NOTE — Patient Instructions (Addendum)
Your chest xray and blood counts are normal  Please start lasix 20mg  once daily  I have printed a prescription for z-pak for you to take if you continue to have a cough for >7 days, or if you develop fevers or start feeling worse after 7 days (so around next Tuesday)  I will let you know when I get the rest of your labs back  Please follow up in 1 week so we can see how you are doing

## 2018-05-06 NOTE — Telephone Encounter (Signed)
FYI

## 2018-05-07 LAB — HIV ANTIBODY (ROUTINE TESTING W REFLEX): HIV 1&2 Ab, 4th Generation: NONREACTIVE

## 2018-05-07 LAB — HEPATITIS B SURFACE ANTIGEN: HEP B S AG: NONREACTIVE

## 2018-05-10 ENCOUNTER — Encounter: Payer: Self-pay | Admitting: Nurse Practitioner

## 2018-05-10 NOTE — Assessment & Plan Note (Signed)
BP reading slightly elevated today, but here for acute illness and not feeling well Continue current medication RTC in 1 week for F/U- recheck BP reading

## 2018-05-10 NOTE — Assessment & Plan Note (Signed)
Update labs F/U with further recommendations pending lab results - HIV antibody; Future - Basic metabolic panel; Future - Hepatitis B surface antigen; Future

## 2018-05-31 ENCOUNTER — Telehealth: Payer: Self-pay

## 2018-05-31 NOTE — Telephone Encounter (Signed)
Irbesartan HCTZ in on back order (per [facility] [address]). Faxed request to inform us of alternatives that are available.

## 2018-06-02 ENCOUNTER — Other Ambulatory Visit: Payer: Self-pay | Admitting: Nurse Practitioner

## 2018-06-02 DIAGNOSIS — M7989 Other specified soft tissue disorders: Secondary | ICD-10-CM

## 2018-06-02 DIAGNOSIS — R059 Cough, unspecified: Secondary | ICD-10-CM

## 2018-06-02 DIAGNOSIS — R05 Cough: Secondary | ICD-10-CM

## 2018-06-16 IMAGING — CR DG CHEST 2V
2 series · 2 of 2 positions shown · non-contrast
Comparison: Chest radiograph March 28, 2016

CLINICAL DATA: Fever.

EXAM:
CHEST  2 VIEW

[chest lat]
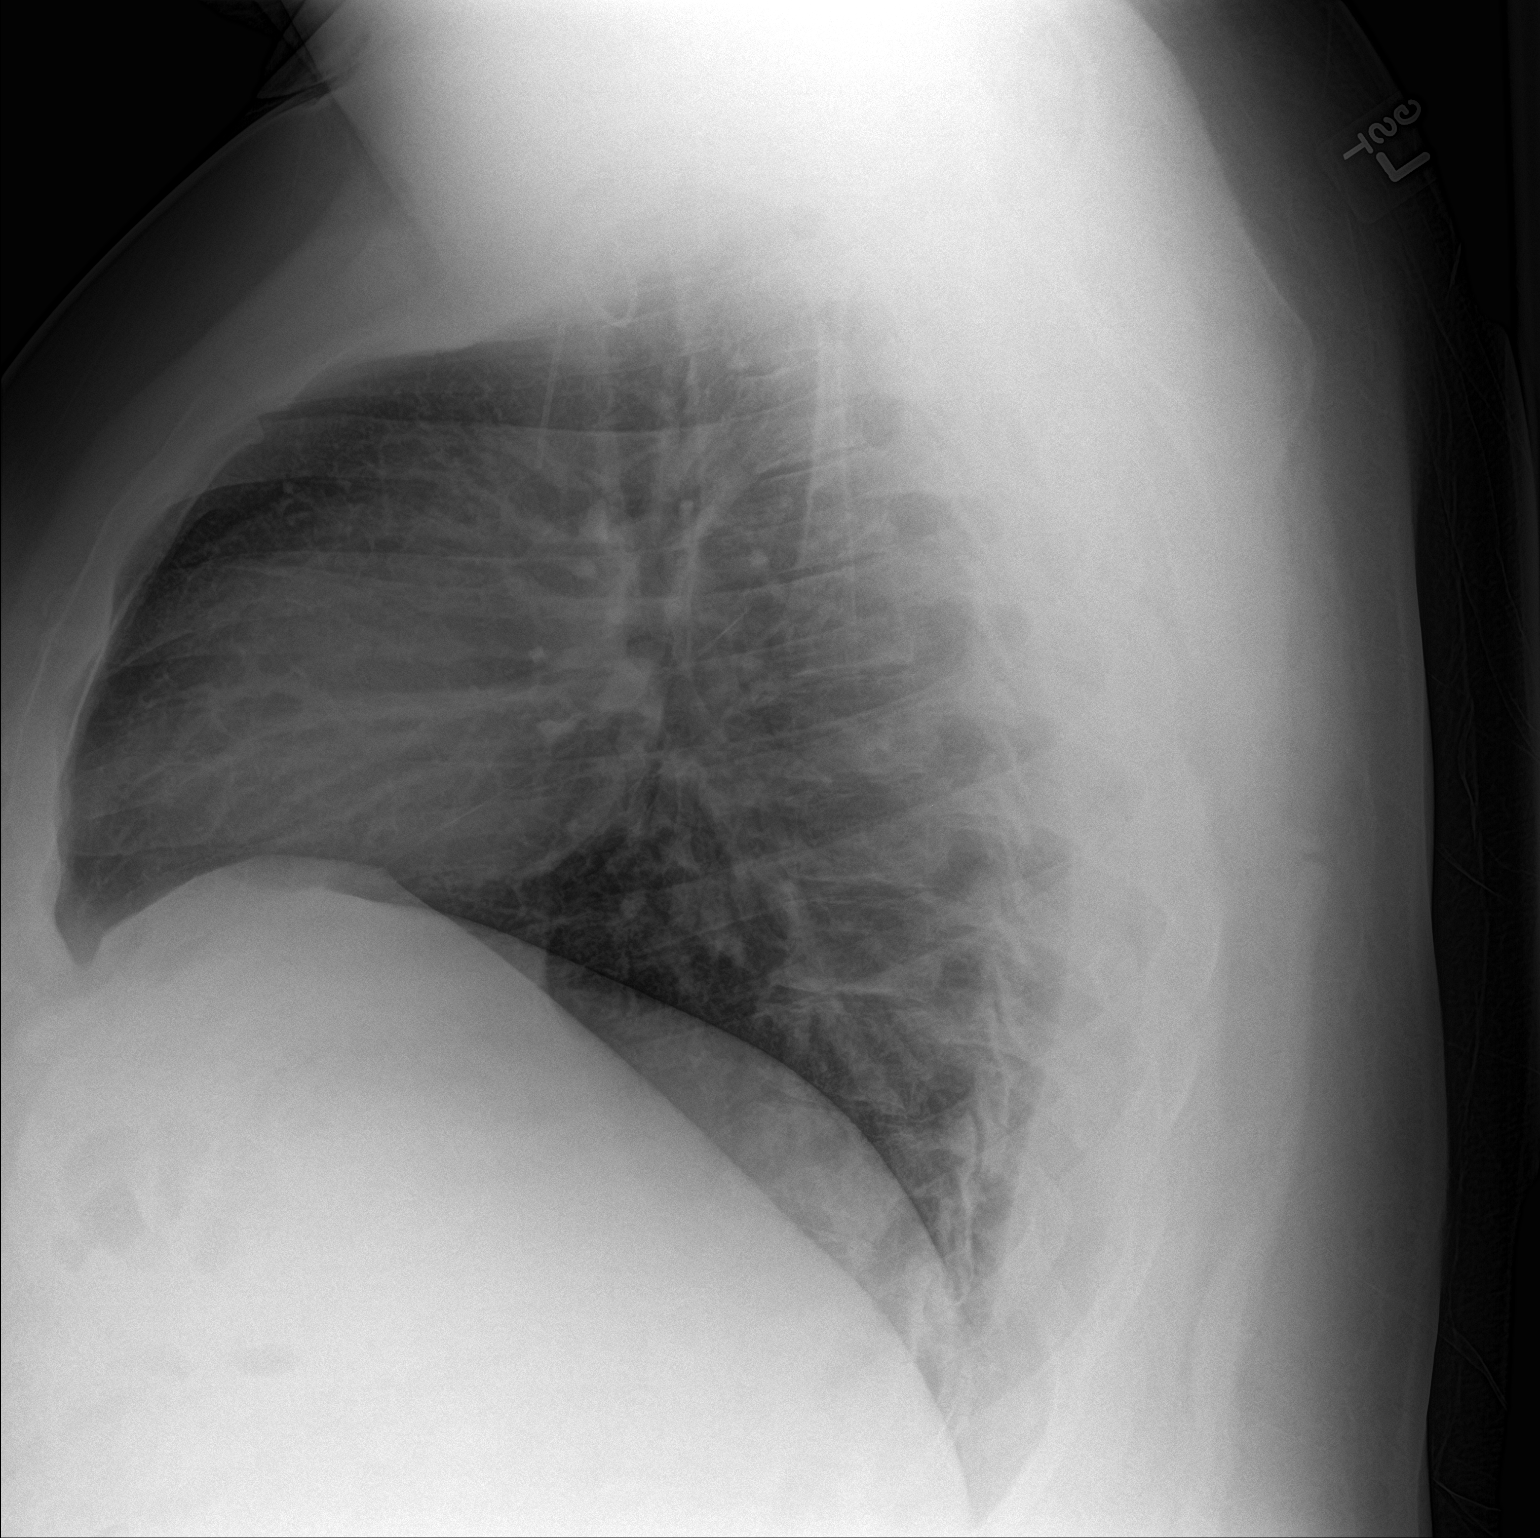

[chest pa]
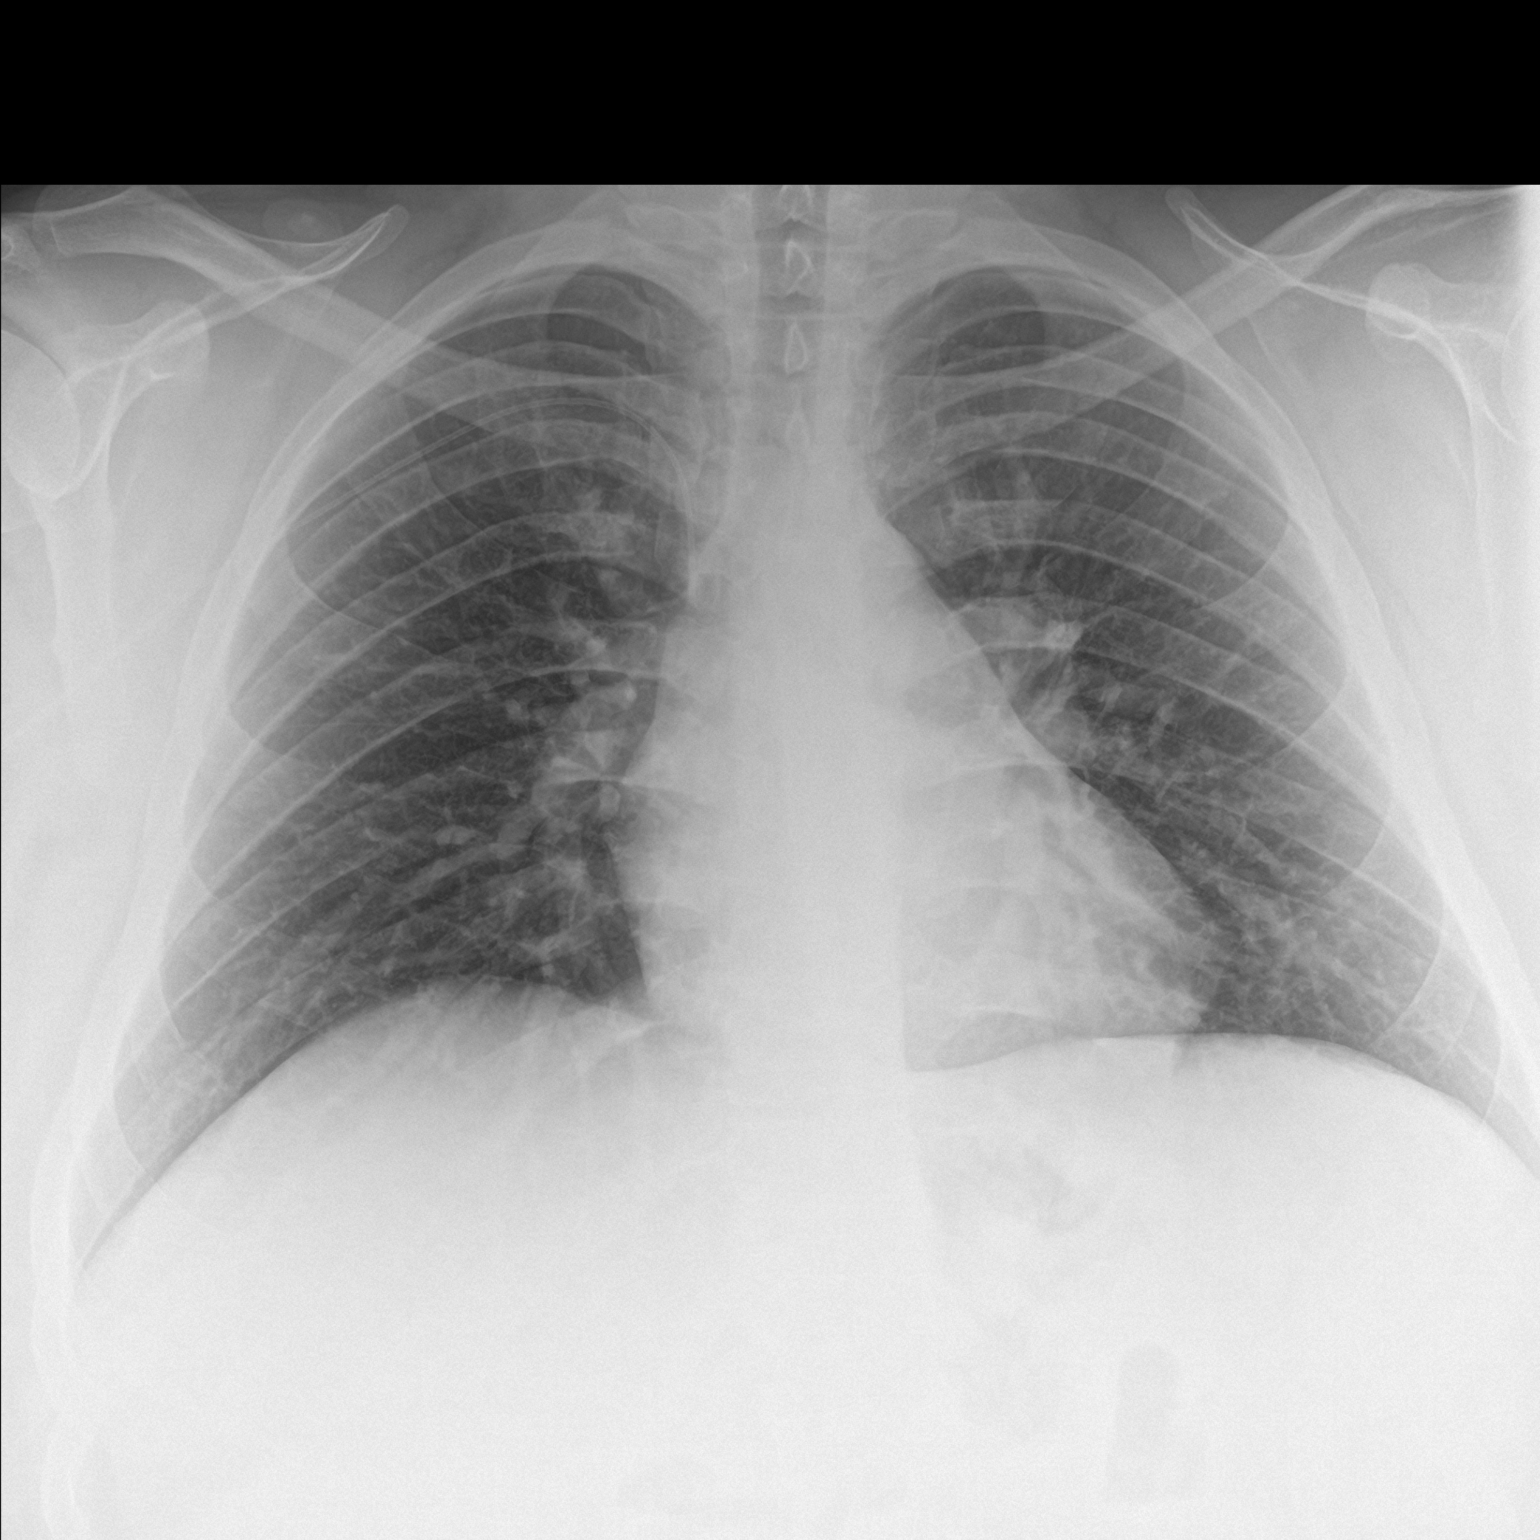

[2 of 2 positions shown; findings below may reference images not displayed]

FINDINGS: Cardiomediastinal silhouette is normal. RIGHT PICC distal tip
projects and mid superior vena cava. The lungs are clear without
pleural effusions or focal consolidations. Trachea projects midline
and there is no pneumothorax. Soft tissue planes and included
osseous structures are non-suspicious. Large body habitus.
IMPRESSION: No acute cardiopulmonary process.

RIGHT PICC distal tip projects in mid superior vena cava, unchanged.

## 2018-06-24 DIAGNOSIS — L905 Scar conditions and fibrosis of skin: Secondary | ICD-10-CM | POA: Diagnosis not present

## 2018-06-24 DIAGNOSIS — L723 Sebaceous cyst: Secondary | ICD-10-CM | POA: Diagnosis not present

## 2018-06-24 DIAGNOSIS — L0591 Pilonidal cyst without abscess: Secondary | ICD-10-CM | POA: Diagnosis not present

## 2018-06-24 DIAGNOSIS — L918 Other hypertrophic disorders of the skin: Secondary | ICD-10-CM | POA: Diagnosis not present

## 2018-07-21 DIAGNOSIS — H018 Other specified inflammations of eyelid: Secondary | ICD-10-CM | POA: Diagnosis not present

## 2018-07-21 DIAGNOSIS — H00021 Hordeolum internum right upper eyelid: Secondary | ICD-10-CM | POA: Diagnosis not present

## 2018-07-28 DIAGNOSIS — H0011 Chalazion right upper eyelid: Secondary | ICD-10-CM | POA: Diagnosis not present

## 2018-07-29 ENCOUNTER — Telehealth: Payer: Self-pay | Admitting: Internal Medicine

## 2018-07-29 DIAGNOSIS — I10 Essential (primary) hypertension: Secondary | ICD-10-CM

## 2018-07-29 MED ORDER — IRBESARTAN-HYDROCHLOROTHIAZIDE 300-12.5 MG PO TABS: 1.0000 | ORAL_TABLET | Freq: Every day | ORAL | 0 refills | Status: DC

## 2018-07-29 NOTE — Telephone Encounter (Signed)
Patient states he called [facility] on 07/25/18. Patient is out. I informed patient the first request came over this morning.

## 2018-07-29 NOTE — Telephone Encounter (Signed)
Reviewed chart pt is due for follow-up appt. Sent 30 day until appt w/MD../lmb

## 2018-07-29 NOTE — Addendum Note (Signed)
Addended by: Earnstine Regal on: 07/29/2018 01:38 PM   Modules accepted: Orders

## 2018-08-30 ENCOUNTER — Other Ambulatory Visit: Payer: Self-pay | Admitting: Internal Medicine

## 2018-08-30 DIAGNOSIS — Z7252 High risk homosexual behavior: Secondary | ICD-10-CM

## 2018-08-30 MED ORDER — EMTRICITABINE-TENOFOVIR AF 200-25 MG PO TABS: 1.0000 | ORAL_TABLET | Freq: Every day | ORAL | 0 refills | Status: DC

## 2018-09-06 ENCOUNTER — Encounter: Payer: BLUE CROSS/BLUE SHIELD | Admitting: Internal Medicine

## 2018-09-06 DIAGNOSIS — Z0289 Encounter for other administrative examinations: Secondary | ICD-10-CM

## 2018-10-03 DIAGNOSIS — J343 Hypertrophy of nasal turbinates: Secondary | ICD-10-CM | POA: Diagnosis not present

## 2018-10-03 DIAGNOSIS — J342 Deviated nasal septum: Secondary | ICD-10-CM | POA: Diagnosis not present

## 2018-10-03 DIAGNOSIS — J322 Chronic ethmoidal sinusitis: Secondary | ICD-10-CM | POA: Diagnosis not present

## 2018-10-03 DIAGNOSIS — J320 Chronic maxillary sinusitis: Secondary | ICD-10-CM | POA: Diagnosis not present

## 2018-10-18 ENCOUNTER — Other Ambulatory Visit: Payer: Self-pay | Admitting: Internal Medicine

## 2018-10-18 DIAGNOSIS — K209 Esophagitis, unspecified without bleeding: Secondary | ICD-10-CM

## 2018-10-18 DIAGNOSIS — R05 Cough: Secondary | ICD-10-CM

## 2018-10-18 DIAGNOSIS — M7989 Other specified soft tissue disorders: Secondary | ICD-10-CM

## 2018-10-18 DIAGNOSIS — I10 Essential (primary) hypertension: Secondary | ICD-10-CM

## 2018-10-18 DIAGNOSIS — R059 Cough, unspecified: Secondary | ICD-10-CM

## 2018-10-23 ENCOUNTER — Other Ambulatory Visit: Payer: Self-pay | Admitting: Internal Medicine

## 2018-10-23 DIAGNOSIS — I10 Essential (primary) hypertension: Secondary | ICD-10-CM

## 2018-10-24 ENCOUNTER — Encounter: Payer: Self-pay | Admitting: Internal Medicine

## 2018-10-28 ENCOUNTER — Ambulatory Visit: Payer: BLUE CROSS/BLUE SHIELD | Admitting: Family Medicine

## 2018-11-02 ENCOUNTER — Other Ambulatory Visit: Payer: Self-pay | Admitting: Internal Medicine

## 2018-11-02 ENCOUNTER — Encounter: Payer: Self-pay | Admitting: Internal Medicine

## 2018-11-02 DIAGNOSIS — I10 Essential (primary) hypertension: Secondary | ICD-10-CM

## 2018-11-03 MED ORDER — IRBESARTAN-HYDROCHLOROTHIAZIDE 300-12.5 MG PO TABS: 1.0000 | ORAL_TABLET | Freq: Every day | ORAL | 0 refills | Status: DC

## 2018-11-15 ENCOUNTER — Encounter: Payer: Self-pay | Admitting: Internal Medicine

## 2018-11-15 ENCOUNTER — Ambulatory Visit (INDEPENDENT_AMBULATORY_CARE_PROVIDER_SITE_OTHER): Payer: BLUE CROSS/BLUE SHIELD | Admitting: Internal Medicine

## 2018-11-15 ENCOUNTER — Other Ambulatory Visit: Payer: Self-pay | Admitting: Internal Medicine

## 2018-11-15 ENCOUNTER — Other Ambulatory Visit (INDEPENDENT_AMBULATORY_CARE_PROVIDER_SITE_OTHER): Payer: BLUE CROSS/BLUE SHIELD

## 2018-11-15 VITALS — BP 130/88 | HR 90 | Temp 99.3°F | Resp 16 | Ht 74.5 in | Wt >= 6400 oz

## 2018-11-15 DIAGNOSIS — R739 Hyperglycemia, unspecified: Secondary | ICD-10-CM | POA: Diagnosis not present

## 2018-11-15 DIAGNOSIS — Z7252 High risk homosexual behavior: Secondary | ICD-10-CM

## 2018-11-15 DIAGNOSIS — I10 Essential (primary) hypertension: Secondary | ICD-10-CM

## 2018-11-15 DIAGNOSIS — Z23 Encounter for immunization: Secondary | ICD-10-CM | POA: Diagnosis not present

## 2018-11-15 LAB — CBC WITH DIFFERENTIAL/PLATELET
Basophils Absolute: 0.1 10*3/uL (ref 0.0–0.1)
Basophils Relative: 0.8 % (ref 0.0–3.0)
Eosinophils Absolute: 0.2 10*3/uL (ref 0.0–0.7)
Eosinophils Relative: 2.2 % (ref 0.0–5.0)
HCT: 42.8 % (ref 39.0–52.0)
HEMOGLOBIN: 14.2 g/dL (ref 13.0–17.0)
Lymphocytes Relative: 32.6 % (ref 12.0–46.0)
Lymphs Abs: 2.3 10*3/uL (ref 0.7–4.0)
MCHC: 33.2 g/dL (ref 30.0–36.0)
MCV: 80.5 fl (ref 78.0–100.0)
Monocytes Absolute: 0.6 10*3/uL (ref 0.1–1.0)
Monocytes Relative: 8.7 % (ref 3.0–12.0)
Neutro Abs: 3.9 10*3/uL (ref 1.4–7.7)
Neutrophils Relative %: 55.7 % (ref 43.0–77.0)
Platelets: 229 10*3/uL (ref 150.0–400.0)
RBC: 5.32 Mil/uL (ref 4.22–5.81)
RDW: 14.4 % (ref 11.5–15.5)
WBC: 7.1 10*3/uL (ref 4.0–10.5)

## 2018-11-15 LAB — URINALYSIS, ROUTINE W REFLEX MICROSCOPIC
BILIRUBIN URINE: NEGATIVE
Hgb urine dipstick: NEGATIVE
KETONES UR: NEGATIVE
Leukocytes, UA: NEGATIVE
Nitrite: NEGATIVE
RBC / HPF: NONE SEEN (ref 0–?)
Specific Gravity, Urine: 1.025 (ref 1.000–1.030)
Total Protein, Urine: NEGATIVE
UROBILINOGEN UA: 0.2 (ref 0.0–1.0)
Urine Glucose: NEGATIVE
WBC, UA: NONE SEEN (ref 0–?)
pH: 5.5 (ref 5.0–8.0)

## 2018-11-15 LAB — BASIC METABOLIC PANEL
BUN: 8 mg/dL (ref 6–23)
CO2: 27 mEq/L (ref 19–32)
Calcium: 9.2 mg/dL (ref 8.4–10.5)
Chloride: 103 mEq/L (ref 96–112)
Creatinine, Ser: 0.99 mg/dL (ref 0.40–1.50)
GFR: 87.28 mL/min (ref >60.00)
Glucose, Bld: 121 mg/dL — ABNORMAL HIGH (ref 70–99)
Potassium: 3.5 mEq/L (ref 3.5–5.1)
Sodium: 139 mEq/L (ref 135–145)

## 2018-11-15 NOTE — Patient Instructions (Signed)

## 2018-11-15 NOTE — Progress Notes (Signed)
Subjective:  Patient ID: Alexis [name], male    DOB: [birth year]  Age: 33 y.o. MRN: [id number]  CC: Hypertension   HPI Alexis [name] presents for f/up - He continues to consider his sexual activity relatively high risk and wants to continue taking prep.  He wants to be vaccinated against HPV.  He has gained quite a bit of weight since I last saw him.  He admits that he is not working on his lifestyle modifications.  Outpatient Medications Prior to Visit  Medication Sig Dispense Refill  . ALPRAZolam (XANAX) 1 MG tablet Take 1 tablet (1 mg total) by mouth 2 (two) times daily as needed for anxiety. 30 tablet 3  . buPROPion (WELLBUTRIN) 75 MG tablet Take 1 tablet (75 mg total) by mouth daily. 90 tablet 3  . CETIRIZINE HCL PO Take by mouth.    . EPINEPHrine (EPIPEN 2-PAK) 0.3 mg/0.3 mL IJ SOAJ injection Inject into the muscle once.    . furosemide (LASIX) 20 MG tablet Take 1 tablet (20 mg total) by mouth daily. 30 tablet 3  . irbesartan-hydrochlorothiazide (AVALIDE) 300-12.5 MG tablet Take 1 tablet by mouth daily. Overdue for appt must see provider for future refills 30 tablet 0  . omeprazole (PRILOSEC) 40 MG capsule TAKE 1 CAPSULE BY MOUTH TWICE A DAY 180 capsule 1  . PROAIR HFA 108 (90 Base) MCG/ACT inhaler INHALE 1-2 PUFFS EVERY 4-6 HOURS AS NEEDED FOR PERSISTENT COUGH OR INCREASED WORK OF BREATHING  0  . topiramate (TOPAMAX) 50 MG tablet TAKE 1 TABLET BY MOUTH TWICE A DAY 60 tablet 3  . emtricitabine-tenofovir AF (DESCOVY) 200-25 MG tablet Take 1 tablet by mouth daily. 90 tablet 0  . verapamil (VERELAN PM) 120 MG 24 hr capsule TAKE 1 CAPSULE BY MOUTH EVERY DAY 90 capsule 1  . azithromycin (ZITHROMAX) 250 MG tablet Take 2 tablets on day 1, then 1 tablet days 2-5. 6 tablet 0  . prednisoLONE acetate (PRED FORTE) 1 % ophthalmic suspension prednisolone acetate 1 % eye drops,suspension     No facility-administered medications prior to visit.     ROS Review of Systems  Constitutional: Positive  for unexpected weight change (wt gain). Negative for appetite change, chills, diaphoresis and fatigue.  HENT: Negative.  Negative for sore throat, trouble swallowing and voice change.   Eyes: Negative for discharge.  Respiratory: Negative for cough, chest tightness, shortness of breath and wheezing.   Cardiovascular: Negative for chest pain, palpitations and leg swelling.  Gastrointestinal: Negative for abdominal pain, constipation, diarrhea and vomiting.  Genitourinary: Negative.  Negative for difficulty urinating, discharge, dysuria, genital sores, hematuria, penile swelling, scrotal swelling, testicular pain and urgency.  Musculoskeletal: Negative for arthralgias and myalgias.  Skin: Negative for rash.  Neurological: Negative.  Negative for dizziness, weakness, light-headedness and numbness.  Hematological: Negative for adenopathy. Does not bruise/bleed easily.  Psychiatric/Behavioral: Negative.     Objective:  BP 130/88 (BP Location: Left Arm, Patient Position: Sitting, Cuff Size: Large)   Pulse 90   Temp 99.3 F (37.4 C) (Oral)   Resp 16   Ht 6' 2.5" (1.892 m)   Wt (!) 424 lb 12 oz (192.7 kg)   SpO2 97%   BMI 53.81 kg/m   BP Readings from Last 3 Encounters:  11/15/18 130/88  05/19/17 (!) 152/90  12/14/16 136/82    Wt Readings from Last 3 Encounters:  11/15/18 (!) 424 lb 12 oz (192.7 kg)  05/19/17 (!) 371 lb (168.3 kg)  12/14/16 (!) 375 lb  8 oz (170.3 kg)    Physical Exam Constitutional:      Appearance: He is obese. He is not ill-appearing or diaphoretic.  HENT:     Nose: Nose normal. No congestion or rhinorrhea.     Mouth/Throat:     Mouth: Mucous membranes are moist.     Pharynx: Oropharynx is clear. No oropharyngeal exudate or posterior oropharyngeal erythema.  Eyes:     General: No scleral icterus.    Conjunctiva/sclera: Conjunctivae normal.  Neck:     Musculoskeletal: Normal range of motion and neck supple.  Cardiovascular:     Rate and Rhythm: Normal  rate and regular rhythm.     Pulses: Normal pulses.     Heart sounds: No murmur. No gallop.   Pulmonary:     Effort: Pulmonary effort is normal. No respiratory distress.     Breath sounds: No stridor. No wheezing, rhonchi or rales.  Abdominal:     General: Abdomen is flat. Bowel sounds are normal. There is no distension.     Palpations: There is no hepatomegaly, splenomegaly or mass.     Tenderness: There is no abdominal tenderness.  Musculoskeletal: Normal range of motion.        General: No swelling.     Right lower leg: No edema.     Left lower leg: No edema.  Lymphadenopathy:     Cervical: No cervical adenopathy.  Skin:    General: Skin is warm and dry.     Findings: No erythema.  Neurological:     General: No focal deficit present.     Mental Status: He is oriented to person, place, and time. Mental status is at baseline.  Psychiatric:        Mood and Affect: Mood normal.        Thought Content: Thought content normal.        Judgment: Judgment normal.     Lab Results  Component Value Date   WBC 7.1 11/15/2018   HGB 14.2 11/15/2018   HCT 42.8 11/15/2018   PLT 229.0 11/15/2018   GLUCOSE 121 (H) 11/15/2018   CHOL 163 02/02/2018   TRIG 147.0 02/02/2018   HDL 44.30 02/02/2018   LDLCALC 89 02/02/2018   ALT 32 09/17/2016   AST 25 09/17/2016   NA 139 11/15/2018   K 3.5 11/15/2018   CL 103 11/15/2018   CREATININE 0.99 11/15/2018   BUN 8 11/15/2018   CO2 27 11/15/2018   TSH 1.970 08/25/2016   HGBA1C 5.5 03/08/2007    Dg Chest 2 View  Result Date: 05/06/2018 CLINICAL DATA:  Cough and congestion.  Shortness of breath. EXAM: CHEST - 2 VIEW COMPARISON:  July 06, 2017 FINDINGS: The heart size and mediastinal contours are within normal limits. Both lungs are clear. The visualized skeletal structures are unremarkable. IMPRESSION: No active cardiopulmonary disease. Electronically Signed   By: Dorise Bullion III M.D   On: 05/06/2018 16:36    Assessment & Plan:    Alexis [name] was seen today for hypertension.  Diagnoses and all orders for this visit:  Essential hypertension- His blood pressure is adequately well controlled.  Electrolytes and renal function are normal. -     CBC with Differential/Platelet; Future -     Basic metabolic panel; Future -     Urinalysis, Routine w reflex microscopic; Future  High risk homosexual behavior- His renal function is normal.  Screening for HIV and hep B infections are both negative.  He can continue taking PrEP. -  Hepatitis B surface antigen; Future -     HIV Antibody (routine testing w rflx); Future -     RPR; Future -     Discontinue: emtricitabine-tenofovir AF (DESCOVY) 200-25 MG tablet; Take 1 tablet by mouth daily. -     emtricitabine-tenofovir AF (DESCOVY) 200-25 MG tablet; Take 1 tablet by mouth daily.  Need for HPV vaccination -     HPV 9-valent vaccine,Recombinat  Hyperglycemia- His blood sugar is mildly elevated.  I will check an A1c to screen for type 2 diabetes mellitus. -     Hemoglobin A1c; Future   I have discontinued Alexis [name]. Alexis "[name]"'s prednisoLONE acetate and azithromycin. I am also having him maintain his PROAIR HFA, CETIRIZINE HCL PO, EPINEPHrine, topiramate, buPROPion, ALPRAZolam, omeprazole, furosemide, irbesartan-hydrochlorothiazide, and emtricitabine-tenofovir AF.  Meds ordered this encounter  Medications  . DISCONTD: emtricitabine-tenofovir AF (DESCOVY) 200-25 MG tablet    Sig: Take 1 tablet by mouth daily.    Dispense:  90 tablet    Refill:  0  . emtricitabine-tenofovir AF (DESCOVY) 200-25 MG tablet    Sig: Take 1 tablet by mouth daily.    Dispense:  90 tablet    Refill:  0     Follow-up: Return in about 6 months (around 05/16/2019).  Scarlette Calico, MD

## 2018-11-16 ENCOUNTER — Encounter: Payer: Self-pay | Admitting: Internal Medicine

## 2018-11-16 DIAGNOSIS — Z23 Encounter for immunization: Secondary | ICD-10-CM | POA: Insufficient documentation

## 2018-11-16 DIAGNOSIS — R739 Hyperglycemia, unspecified: Secondary | ICD-10-CM | POA: Insufficient documentation

## 2018-11-17 ENCOUNTER — Encounter: Payer: Self-pay | Admitting: Internal Medicine

## 2018-11-17 LAB — HEPATITIS B SURFACE ANTIGEN: Hepatitis B Surface Ag: NONREACTIVE

## 2018-11-17 LAB — RPR: RPR Ser Ql: NONREACTIVE

## 2018-11-17 LAB — HIV ANTIBODY (ROUTINE TESTING W REFLEX): HIV 1&2 Ab, 4th Generation: NONREACTIVE

## 2018-11-17 MED ORDER — EMTRICITABINE-TENOFOVIR AF 200-25 MG PO TABS: 1.0000 | ORAL_TABLET | Freq: Every day | ORAL | 0 refills | Status: DC

## 2018-11-18 ENCOUNTER — Other Ambulatory Visit (INDEPENDENT_AMBULATORY_CARE_PROVIDER_SITE_OTHER): Payer: BLUE CROSS/BLUE SHIELD

## 2018-11-18 DIAGNOSIS — R739 Hyperglycemia, unspecified: Secondary | ICD-10-CM | POA: Diagnosis not present

## 2018-11-18 LAB — HEMOGLOBIN A1C: HEMOGLOBIN A1C: 5.4 % (ref 4.6–6.5)

## 2018-11-18 NOTE — Assessment & Plan Note (Signed)
He agrees to improve his lifestyle modifications. I mentioned him being seen at a bariatric clinic but have not gotten a response back from him yet. If he says yes to this and I will send a referral.

## 2018-11-19 ENCOUNTER — Encounter: Payer: Self-pay | Admitting: Internal Medicine

## 2018-11-24 ENCOUNTER — Other Ambulatory Visit: Payer: Self-pay | Admitting: Internal Medicine

## 2018-11-24 DIAGNOSIS — I10 Essential (primary) hypertension: Secondary | ICD-10-CM

## 2018-12-04 ENCOUNTER — Other Ambulatory Visit: Payer: Self-pay | Admitting: Internal Medicine

## 2018-12-16 ENCOUNTER — Ambulatory Visit: Payer: Self-pay

## 2018-12-23 ENCOUNTER — Other Ambulatory Visit: Payer: Self-pay

## 2018-12-23 ENCOUNTER — Ambulatory Visit (INDEPENDENT_AMBULATORY_CARE_PROVIDER_SITE_OTHER): Payer: BLUE CROSS/BLUE SHIELD | Admitting: *Deleted

## 2018-12-23 DIAGNOSIS — Z23 Encounter for immunization: Secondary | ICD-10-CM | POA: Diagnosis not present

## 2019-01-09 ENCOUNTER — Encounter: Payer: Self-pay | Admitting: Internal Medicine

## 2019-02-10 ENCOUNTER — Other Ambulatory Visit: Payer: Self-pay | Admitting: Internal Medicine

## 2019-02-10 DIAGNOSIS — Z7252 High risk homosexual behavior: Secondary | ICD-10-CM

## 2019-05-13 ENCOUNTER — Other Ambulatory Visit: Payer: Self-pay | Admitting: Internal Medicine

## 2019-05-13 DIAGNOSIS — I10 Essential (primary) hypertension: Secondary | ICD-10-CM

## 2019-05-19 ENCOUNTER — Other Ambulatory Visit: Payer: Self-pay

## 2019-05-19 ENCOUNTER — Ambulatory Visit (INDEPENDENT_AMBULATORY_CARE_PROVIDER_SITE_OTHER): Payer: BC Managed Care – PPO

## 2019-05-19 DIAGNOSIS — Z23 Encounter for immunization: Secondary | ICD-10-CM | POA: Diagnosis not present

## 2019-05-19 DIAGNOSIS — Z299 Encounter for prophylactic measures, unspecified: Secondary | ICD-10-CM

## 2019-05-25 ENCOUNTER — Encounter: Payer: Self-pay | Admitting: Internal Medicine

## 2019-06-08 ENCOUNTER — Other Ambulatory Visit: Payer: Self-pay | Admitting: Internal Medicine

## 2019-06-08 ENCOUNTER — Telehealth: Payer: Self-pay

## 2019-06-08 NOTE — Telephone Encounter (Signed)
LOV was 11/15/2018.  Rx refill for irbesartan - HCTZ was denied Per PCP pt needs to be seen.  Called mobile number. No answer. lvm for pt to schedule an appt.

## 2019-06-12 ENCOUNTER — Other Ambulatory Visit: Payer: Self-pay | Admitting: Internal Medicine

## 2019-06-12 DIAGNOSIS — I10 Essential (primary) hypertension: Secondary | ICD-10-CM

## 2019-06-15 ENCOUNTER — Telehealth: Payer: Self-pay | Admitting: Internal Medicine

## 2019-06-15 ENCOUNTER — Ambulatory Visit: Payer: BC Managed Care – PPO | Admitting: Internal Medicine

## 2019-06-15 NOTE — Telephone Encounter (Signed)
Pt informed me he will be looking for a new doctor because his only day off is Fridays and he needs someone who works on Fridays. He says he likes Dr. Ronnald Ramp and Adelina Mings but this was not working out.

## 2019-06-21 DIAGNOSIS — G43109 Migraine with aura, not intractable, without status migrainosus: Secondary | ICD-10-CM | POA: Diagnosis not present

## 2019-06-21 DIAGNOSIS — F324 Major depressive disorder, single episode, in partial remission: Secondary | ICD-10-CM | POA: Diagnosis not present

## 2019-06-21 DIAGNOSIS — Z79899 Other long term (current) drug therapy: Secondary | ICD-10-CM | POA: Diagnosis not present

## 2019-06-21 DIAGNOSIS — I10 Essential (primary) hypertension: Secondary | ICD-10-CM | POA: Diagnosis not present

## 2019-07-12 DIAGNOSIS — L720 Epidermal cyst: Secondary | ICD-10-CM | POA: Diagnosis not present

## 2019-07-12 DIAGNOSIS — L738 Other specified follicular disorders: Secondary | ICD-10-CM | POA: Diagnosis not present

## 2019-07-12 DIAGNOSIS — L812 Freckles: Secondary | ICD-10-CM | POA: Diagnosis not present

## 2019-07-12 DIAGNOSIS — D2262 Melanocytic nevi of left upper limb, including shoulder: Secondary | ICD-10-CM | POA: Diagnosis not present

## 2019-08-10 DIAGNOSIS — Z20828 Contact with and (suspected) exposure to other viral communicable diseases: Secondary | ICD-10-CM | POA: Diagnosis not present

## 2019-08-11 ENCOUNTER — Other Ambulatory Visit: Payer: Self-pay | Admitting: Internal Medicine

## 2019-08-11 DIAGNOSIS — I10 Essential (primary) hypertension: Secondary | ICD-10-CM

## 2019-09-12 ENCOUNTER — Telehealth: Payer: BC Managed Care – PPO | Admitting: Physician Assistant

## 2019-09-12 DIAGNOSIS — J329 Chronic sinusitis, unspecified: Secondary | ICD-10-CM

## 2019-09-12 MED ORDER — DOXYCYCLINE HYCLATE 100 MG PO TABS: 100.0000 mg | ORAL_TABLET | Freq: Two times a day (BID) | ORAL | 0 refills | Status: DC

## 2019-09-12 NOTE — Progress Notes (Signed)
Hi [name],  I am sorry you are not feeling well.  I see you have a history of sinusitis and considered surgery in the past.  Please remain in contact with your PCP and schedule an appointment there should another episode recur.     Here is how we plan to help!  Based on what you have shared with me it looks like you have sinusitis.  Sinusitis is inflammation and infection in the sinus cavities of the head.  Based on your presentation I believe you most likely have Acute Bacterial Sinusitis.  This is an infection caused by bacteria and is treated with antibiotics. I have prescribed Doxycycline 100mg  by mouth twice a day for 10 days. You may use an oral decongestant such as Mucinex D or if you have glaucoma or high blood pressure use plain Mucinex. Saline nasal spray help and can safely be used as often as needed for congestion.  If you develop worsening sinus pain, fever or notice severe headache and vision changes, or if symptoms are not better after completion of antibiotic, please schedule an appointment with a health care provider.    Sinus infections are not as easily transmitted as other respiratory infection, however we still recommend that you avoid close contact with loved ones, especially the very young and elderly.  Remember to wash your hands thoroughly throughout the day as this is the number one way to prevent the spread of infection!  Home Care:  Only take medications as instructed by your medical team.  Complete the entire course of an antibiotic.  Do not take these medications with alcohol.  A steam or ultrasonic humidifier can help congestion.  You can place a towel over your head and breathe in the steam from hot water coming from a faucet.  Avoid close contacts especially the very young and the elderly.  Cover your mouth when you cough or sneeze.  Always remember to wash your hands.  Get Help Right Away If:  You develop worsening fever or sinus pain.  You develop a severe  head ache or visual changes.  Your symptoms persist after you have completed your treatment plan.  Make sure you  Understand these instructions.  Will watch your condition.  Will get help right away if you are not doing well or get worse.  Your e-visit answers were reviewed by a board certified advanced clinical practitioner to complete your personal care plan.  Depending on the condition, your plan could have included both over the counter or prescription medications.  If there is a problem please reply  once you have received a response from your provider.  Your safety is important to [place].  If you have drug allergies check your prescription carefully.    You can use MyChart to ask questions about today's visit, request a non-urgent call back, or ask for a work or school excuse for 24 hours related to this e-Visit. If it has been greater than 24 hours you will need to follow up with your provider, or enter a new e-Visit to address those concerns.  You will get an e-mail in the next two days asking about your experience.  I hope that your e-visit has been valuable and will speed your recovery. Thank you for using e-visits.   Greater than 5 minutes, yet less than 10 minutes of time have been spent researching, coordinating and implementing care for this patient today.

## 2019-09-13 DIAGNOSIS — R519 Headache, unspecified: Secondary | ICD-10-CM | POA: Diagnosis not present

## 2019-09-13 DIAGNOSIS — R0602 Shortness of breath: Secondary | ICD-10-CM | POA: Diagnosis not present

## 2019-09-13 DIAGNOSIS — R52 Pain, unspecified: Secondary | ICD-10-CM | POA: Diagnosis not present

## 2019-09-13 DIAGNOSIS — Z20828 Contact with and (suspected) exposure to other viral communicable diseases: Secondary | ICD-10-CM | POA: Diagnosis not present

## 2019-10-10 ENCOUNTER — Telehealth: Payer: Self-pay | Admitting: Neurology

## 2019-10-10 NOTE — Telephone Encounter (Signed)
I received a referral for this patient. He hasn;t been seen in over 3 years so he will technically be a new patient however I know him extremely well and also treat his family members. Can we please add him onto our schedule Monday the 4th. Looks like I have a few openings at 830, 9, and 130 so please ask him which spot he wants and you can fill the other 2 with any other new patients. thanks!

## 2019-10-11 NOTE — Telephone Encounter (Signed)
[email or web address]

## 2019-10-11 NOTE — Telephone Encounter (Signed)
Looks like he took the 130. Thanks!

## 2019-10-11 NOTE — Telephone Encounter (Signed)
Patient has been called-I left a message letting him know there's an opening for Monday and if he could call me back as soon as possible.

## 2019-10-11 NOTE — Telephone Encounter (Signed)
Noted thanks. Appt note updated.

## 2019-10-16 ENCOUNTER — Encounter: Payer: Self-pay | Admitting: Neurology

## 2019-10-16 ENCOUNTER — Other Ambulatory Visit: Payer: Self-pay

## 2019-10-16 ENCOUNTER — Ambulatory Visit: Payer: BC Managed Care – PPO | Admitting: Neurology

## 2019-10-16 VITALS — BP 143/102 | HR 97 | Temp 96.6°F | Ht 78.0 in | Wt >= 6400 oz

## 2019-10-16 DIAGNOSIS — I10 Essential (primary) hypertension: Secondary | ICD-10-CM | POA: Diagnosis not present

## 2019-10-16 DIAGNOSIS — G44019 Episodic cluster headache, not intractable: Secondary | ICD-10-CM | POA: Diagnosis not present

## 2019-10-16 MED ORDER — VERAPAMIL HCL ER 240 MG PO CP24: 240.0000 mg | ORAL_CAPSULE | Freq: Every day | ORAL | 6 refills | Status: DC

## 2019-10-16 MED ORDER — NURTEC 75 MG PO TBDP: 75.0000 mg | ORAL_TABLET | Freq: Every day | ORAL | 6 refills | Status: DC | PRN

## 2019-10-16 MED ORDER — AJOVY 225 MG/1.5ML ~~LOC~~ SOAJ: 675.0000 mg | SUBCUTANEOUS | 11 refills | Status: DC

## 2019-10-16 MED ORDER — SUMATRIPTAN SUCCINATE 6 MG/0.5ML ~~LOC~~ SOLN: 6.0000 mg | SUBCUTANEOUS | 6 refills | Status: DC | PRN

## 2019-10-16 NOTE — Patient Instructions (Addendum)
Ajovy monthly  Nurtec daily as needed At onset Imitrex injection Increased Verapamil to 240mg    Cluster Headache A cluster headache is a type of headache that causes deep, intense head pain. Cluster headaches can last from 15 minutes to 3 hours. They usually occur:  On one side of the head. They may occur on the other side when a new cluster of headaches begins.  Repeatedly over weeks to months.  Several times a day.  At the same time of day, often at night.  More often in the fall and springtime. What are the causes? The cause of this condition is not known. What increases the risk? This condition is more likely to develop in:  Males.  People who drink alcohol.  People who smoke or use products that contain nicotine or tobacco.  People who take medicines that cause blood vessels to expand, such as nitroglycerin.  People who take antihistamines. What are the signs or symptoms? Symptoms of this condition include:  Severe pain on one side of the head that begins behind or around your eye or temple.  Pain on one side of the head.  Nausea.  Sensitivity to light.  Runny nose and nasal stuffiness.  Sweaty, pale skin on the face.  Droopy or swollen eyelid, eye redness, or tearing.  Restlessness and agitation. How is this diagnosed? This condition may be diagnosed based on:  Your symptoms.  A physical exam. Your health care provider may order tests to see if your headaches are caused by another medical condition. These tests may show that you do not have cluster headaches. Tests may include:  A CT scan of your head.  An MRI of your head.  Lab tests. How is this treated? This condition may be treated with:  Medicines to relieve pain and to prevent repeated (recurrent) attacks. Some people may need a combination of medicines.  Oxygen. This helps to relieve pain. Follow these instructions at home: Headache diary Keep a headache diary as told by your health  care provider. Doing this can help you and your health care provider figure out what triggers your headaches. In your headache diary, include information about:  The time of day that your headache started and what you were doing when it began.  How long your headache lasted.  Where your pain started and whether it moved to other areas.  The type of pain, such as burning, stabbing, throbbing, or cramping.  Your level of pain. Use a pain scale and rate the pain with a number from 1 (mild) up to 10 (severe).  The treatment that you used, and any change in symptoms after treatment.  Medicines  Take over-the-counter and prescription medicines only as told by your health care provider.  Do not drive or use heavy machinery while taking prescription pain medicine.  Use oxygen as told by your health care provider. Lifestyle  Follow a regular sleep schedule. Do not vary the time that you go to bed or the amount that you sleep from day to day. It is important to stay on the same schedule during a cluster period to help prevent headaches.  Exercise regularly.  Eat a healthy diet and avoid foods that may trigger your headaches.  Avoid alcohol.  Do not use any products that contain nicotine or tobacco, such as cigarettes and e-cigarettes. If you need help quitting, ask your health care provider. Contact a health care provider if:  Your headaches change, become more severe, or occur more often.  The medicine or oxygen that your health care provider recommended does not help. Get help right away if:  You faint.  You have weakness or numbness, especially on one side of your body or face.  You have double vision.  You have nausea or vomiting that does not go away within several hours.  You have trouble talking, walking, or keeping your balance.  You have pain or stiffness in your neck.  You have a fever. Summary  A cluster headache is a type of headache that causes deep, intense  head pain, usually on one side of the head.  Keep a headache diary to help discover what triggers your headaches.  A regular sleep schedule can help prevent headaches. This information is not intended to replace advice given to you by your health care provider. Make sure you discuss any questions you have with your health care provider. Document Revised: 03/08/2019 Document Reviewed: 06/09/2016 Elsevier Patient Education  [place].

## 2019-10-16 NOTE — Progress Notes (Signed)
[id number]    Provider:  Dr Jaynee Eagles Referring Provider: Janith Lima, MD Primary Care Physician:  Janith Lima, MD  CC: Headache  HPI 10/16/2019: This is a patient referred from Dr. Ronnald Ramp for migraines and cluster headaches.  I have not seen him for over 3 years.  He has a long history of migraines.  Work-up in the past included MRI of the brain, MRV, lumbar puncture for evaluation of intracranial idiopathic hypertension or other causes of headache and was negative.  He has a family history of migraines in his mother.  In the past he was on Topamax but since last I saw him he is not on topamax. 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 He was doing well until this year only having one migraine a month. Over the last 3 months since a sinus infection he is having more headaches, only on the left side, severe pain, daily, he had tearing of the eye, severe jabs, can last up to an hour, a few times a day, he wakes up with it.  They can happen several times a day.  They are severe, he paces, he is tried multiple acute management medications such as over-the-counter Tylenol, Benadryl, ibuprofen and this does not help.  He is not sure of the sinus infection had anything to do with precipitating this.  He has had these in the past, last time was several years ago, very consistent with cluster headaches.  We discussed the difference between cluster headaches and migraines.  I do feel as though he has both and there is higher incidence and migraine auras with other kinds of headaches.  Cluster headaches are also more common in males. No other focal neurologic deficits, associated symptoms, inciting events or modifiable factors.  Interval update 06/09/2016: 34 year old male with a past medical history of migraines and morbid obesity here for vision changes, possible papilledema seen on ophthalmology examination recently. [place] and his  vision went blurry, very fuzzy and blurry for about 4-5 minutes.  Happened 3 times, it was significant. Both eyes. Happened again the next day. It was not an aura, it was blurry. Went away eventually, blinking eyes. He continues to have episodes of significant vision changes. He is having 1-2 significant migraine a month. He has had episodes of feeling like his head is going to explode, severe pressure all around the head with 10 out of 10 pain. Not worse with laying down, he has noticed some new hearing changes, more of a ringing. Discussion with patient and his mother. Symptoms are concerning for intracranial hypertension. Need to rule out tumors or masses causing the increased pressure as well as other intracranial etiologies. Discussed all the imaging needed as well as lumbar puncture and reasons why. Also discussed possible findings and how we would treat them. If consistent with idiopathic intracranial hypertension would likely start on Diamox. Patient has been on doxycycline recently for multiple infections and other medications as well, he recently spent 14 days in the hospital for infections and sepsis. No history of high doses of vitamin A use or other medications that are associated with pseudotumor cerebri. Discussed that weight loss is key in pseudotumor cerebri. Also discussed that untreated can cause permanent vision loss. She does vision or headaches worsen he is to proceed to the emergency room immediately.  Interval Update 02/27/2016:  He ws at work. Last Wednesday. He had acute onset of left  eye vision loss. Then had a headache and lasted all day. Would not go away. Typical headache migraines, left side, throbbing, light sensitivity, sound sensitivity. He has a lot of light and sound sensitivity. No nausea or vomiting with the headaches. He has 4-5 headache days a month. Not typically any are migrainous. Had taken imitrex orally in the past. Migraines can be quick. He wakes up with a headache.  He had a sleep study and he does not have sleep apnea. Did not need a cpap. They are not serious, they tend to go away quickly. Not often does he have the headaches. He is on Topamax and Verapamil and doing well.   Initial visit 11/14/2014 : GRIER SOUDER is a 34 y.o. male here as a referral from Dr. Ronnald Ramp for headache.   Migraines are well controlled if he takes the indomethacin. If he falls asleep and forgets the indomethacin on the couch he will wake up with a headache. He will wake up at 4am with a headache. He is on Verapamil and has not missed a dose. He does not have a lot of nausea with the migraines. No signs of OSA.   He is on Wellbutrin. He says that he has been well on Wellbutrin for many years but he is unsure if it is helping him anymore. He has decreased energy and decreased concentration and he has gained weight and that makes him depressed.  Discussed that people with headaches often have decreased serotonin levels and that if location is not helping maybe we should change to an SSRI.    11/14/2014: Patient is here as an add on for new onset severe headache. PMHx of occular migraines. He is on 120mg  verapamil and completely controlled. Friday night they were at the theatre. The most he laughed the pressure was building in his head. It is on the left side. Throbbing is constant. He also feels stabbing in the brain in the temporal area. He has constant pain Extremely painful. The pain was so bad that his BP shot up when he was on telemetry in the ED. The pain is constant but then he has periods of elevated pain in the head, the pain stabs and holds for a minute. The worst pain he has ever felt in his life. Severe 10/10. Eye waters, keeps getting hot flashes, clammy feeling. +light sensitivity. It has been continuous. No weakness, vision. Has some pulsatile hearing changes. He has been sick recently and has a sore throat and a sinus infection.  Reviewed notes, labs and imaging from  outside physicians, which showed: MRI brain w/wo contrast 04/2012 showed no acute intracranial abnormalities including mass lesion or mass effect, hydrocephalus, extra-axial fluid collection, midline shift, hemorrhage, or acute infarction, large ischemic events (personally reviewed images). BMP/CMP unremarkable.   Review of Systems: Patient complains of symptoms per HPI as well as the following symptoms: headache, weight gain. Pertinent negatives per HPI. All others negative.  Social History   Socioeconomic History  . Marital status: Significant Other    Spouse name: Not on file  . Number of children: 0  . Years of education: College  . Highest education level: Not on file  Occupational History  . Occupation: TEFL teacher: [employer]  Tobacco Use  . Smoking status: Never Smoker  . Smokeless tobacco: Never Used  Substance and Sexual Activity  . Alcohol use: No    Comment: socially  . Drug use: No  . Sexual activity: Not Currently  Other Topics Concern  . Not on file  Social History Narrative   Lives at home with parents.   Works at [employer].   Single.   Caffeine use: 3-4 cups daily.    Social Determinants of Health   Financial Resource Strain:   . Difficulty of Paying Living Expenses: Not on file  Food Insecurity:   . Worried About Charity fundraiser in the Last Year: Not on file  . Ran Out of Food in the Last Year: Not on file  Transportation Needs:   . Lack of Transportation (Medical): Not on file  . Lack of Transportation (Non-Medical): Not on file  Physical Activity:   . Days of Exercise per Week: Not on file  . Minutes of Exercise per Session: Not on file  Stress:   . Feeling of Stress : Not on file  Social Connections:   . Frequency of Communication with Friends and Family: Not on file  . Frequency of Social Gatherings with Friends and Family: Not on file  . Attends Religious Services: Not on file  . Active Member of Clubs or Organizations: Not  on file  . Attends Archivist Meetings: Not on file  . Marital Status: Not on file  Intimate Partner Violence:   . Fear of Current or Ex-Partner: Not on file  . Emotionally Abused: Not on file  . Physically Abused: Not on file  . Sexually Abused: Not on file    Family History  Problem Relation Age of Onset  . Hypertension Father   . Diabetes Father        type 2   . Asthma Mother   . Clotting disorder Mother        Factor 5  . Rheum arthritis Mother   . Skin cancer Mother   . Heart attack Mother   . Emphysema Maternal Grandmother        smoker  . Asthma Maternal Grandmother   . Lung cancer Maternal Grandmother   . Allergies Other        whole family-both sides of family  . Asthma Paternal Grandmother   . Diabetes Paternal Grandmother        type 2  . Heart disease Other        MGM deceased with MI; both sides of grandparents  . Clotting disorder Maternal Uncle        Factor 5    Past Medical History:  Diagnosis Date  . Acid reflux   . ADD 09/13/2007  . ALLERGIC RHINITIS 06/10/2009  . ASTHMA, UNSPECIFIED, UNSPECIFIED STATUS 07/19/2008  . DEPRESSIVE DISORDER 09/13/2007  . DYSMETABOLIC SYNDROME 0000000  . GERD 09/13/2007  . HYPERLIPIDEMIA 10/10/2009  . HYPERTENSION 09/13/2007  . IBS 03/20/2009  . Morbid obesity ([place]) 07/19/2008  . Skin cancer     Past Surgical History:  Procedure Laterality Date  . ANKLE SURGERY  2007  . APPENDECTOMY  2005  . IR GENERIC HISTORICAL  07/23/2016   IR FLUORO GUIDED NEEDLE PLC ASPIRATION/INJECTION LOC 07/23/2016 Arne Cleveland, MD [facility]  . SKIN CANCER EXCISION     teenager  . TEE WITHOUT CARDIOVERSION N/A 04/15/2016   Procedure: TRANSESOPHAGEAL ECHOCARDIOGRAM (TEE);  Surgeon: Sanda Klein, MD;  Location: [facility] ENDOSCOPY;  Service: Cardiovascular;  Laterality: N/A;    Current Outpatient Medications  Medication Sig Dispense Refill  . ALPRAZolam (XANAX) 1 MG tablet Take 1 tablet (1 mg total) by mouth 2 (two) times daily  as needed for anxiety. 30 tablet 3  .  buPROPion (WELLBUTRIN) 75 MG tablet TAKE 1 TABLET BY MOUTH EVERY DAY 90 tablet 1  . CETIRIZINE HCL PO Take by mouth.    . furosemide (LASIX) 20 MG tablet Take 1 tablet (20 mg total) by mouth daily. (Patient taking differently: Take 20 mg by mouth daily as needed. ) 30 tablet 3  . irbesartan-hydrochlorothiazide (AVALIDE) 300-12.5 MG tablet TAKE 1 TABLET BY MOUTH EVERY DAY NEEDS APPT FOR REFILLS 90 tablet 1  . omeprazole (PRILOSEC) 40 MG capsule TAKE 1 CAPSULE BY MOUTH TWICE A DAY 180 capsule 1  . PROAIR HFA 108 (90 Base) MCG/ACT inhaler INHALE 1-2 PUFFS EVERY 4-6 HOURS AS NEEDED FOR PERSISTENT COUGH OR INCREASED WORK OF BREATHING  0  . verapamil (VERELAN PM) 240 MG 24 hr capsule Take 1 capsule (240 mg total) by mouth daily. 90 capsule 6  . EPINEPHrine (EPIPEN 2-PAK) 0.3 mg/0.3 mL IJ SOAJ injection Inject into the muscle once.    . Fremanezumab-vfrm (AJOVY) 225 MG/1.5ML SOAJ Inject 675 mg into the skin every 30 (thirty) days. 3 pen 11  . Rimegepant Sulfate (NURTEC) 75 MG TBDP Take 75 mg by mouth daily as needed. For migraines. Take as close to onset of migraine as possible. One daily maximum. 10 tablet 6  . SUMAtriptan (IMITREX) 6 MG/0.5ML SOLN injection Inject 0.5 mLs (6 mg total) into the skin every 2 (two) hours as needed for migraine or headache. Take one dose at headache onset, can take additional dose 2hrs later if needed. No more then 2 injections in 24hrs 6 mL 6   No current facility-administered medications for this visit.    Allergies as of 10/16/2019 - Review Complete 10/16/2019  Allergen Reaction Noted  . Citrus Anaphylaxis and Other (See Comments) 09/04/2012  . Amoxicillin-pot clavulanate Nausea Only   . Sulfamethoxazole-trimethoprim Other (See Comments)   . Vancomycin Other (See Comments) 04/11/2016    Vitals: BP (!) 143/102 (BP Location: Left Arm, Patient Position: Sitting, Cuff Size: Large)   Pulse 97   Temp (!) 96.6 F (35.9 C) Comment:  taken at front door  Ht 6\' 6"  (1.981 m)   Wt (!) 456 lb (206.8 kg)   BMI 52.70 kg/m  Last Weight:  Wt Readings from Last 1 Encounters:  10/16/19 (!) 456 lb (206.8 kg)   Last Height:   Ht Readings from Last 1 Encounters:  10/16/19 6\' 6"  (1.981 m)    Physical exam: Exam: Gen: NAD, conversant, well nourised, obese, well groomed                     CV: RRR, no MRG. No Carotid Bruits. No peripheral edema, warm, nontender Eyes: Conjunctivae clear without exudates or hemorrhage  Neuro: Detailed Neurologic Exam  Speech:    Speech is normal; fluent and spontaneous with normal comprehension.  Cognition:    The patient is oriented to person, place, and time;     recent and remote memory intact;     language fluent;     normal attention, concentration,     fund of knowledge Cranial Nerves:    The pupils are equal, round, and reactive to light. The fundi are normal and spontaneous venous pulsations are present. Visual fields are full to finger confrontation. Extraocular movements are intact. Trigeminal sensation is intact and the muscles of mastication are normal. The face is symmetric. The palate elevates in the midline. Hearing intact. Voice is normal. Shoulder shrug is normal. The tongue has normal motion without fasciculations.   Coordination:  Normal finger to nose and heel to shin. Normal rapid alternating movements.   Gait:    Heel-toe and tandem gait are normal.   Motor Observation:    No asymmetry, no atrophy, and no involuntary movements noted. Tone:    Normal muscle tone.    Posture:    Posture is normal. normal erect    Strength:    Strength is V/V in the upper and lower limbs.      Sensation: intact to LT     Reflex Exam:  DTR's:    Deep tendon reflexes in the upper and lower extremities are normal bilaterally.   Toes:    The toes are downgoing bilaterally.   Clonus:    Clonus is absent.  Assessment/Plan: This is a lovely 34 year old male with a past  medical history of migraines treated with Topamax, ocular migraines treated with verapamil, cluster headaches also treated with verapamil.  He has been doing extremely well with his migraines only 1 migraine a month.  Today he comes back with cluster headaches.  He has had evaluation in the past including imaging of his brain and lumbar puncture which were all unremarkable.  Cluster headaches: He has had these in the past, last time was several years ago, very consistent with cluster headaches.  We discussed the difference between cluster headaches and migraines.  I do feel as though he has both and there is higher incidence and migraine auras with other kinds of headaches.  Cluster headaches are also more common in males.  And as the name implies they do happen in "clusters", they can happen at the same time every year, they may leave for a while and then come back.  We discussed the various treatments which include high-dose steroids initially and he would not like to do that.  We did discuss that the erenumab which is a CGRP medication has been approved for this condition, today we will inject him with 3 Ajovy which I feel may be just as effective, we will also increase his verapamil which is first-line treatment for cluster headaches, I will give him injectable Imitrex at the onset of his headaches.  I also gave him samples of Nurtec for 14 days to see if this can bridge until the other medications start to work.  Discussed oxygen however it is very difficult to get oxygen approved if you do not have a pulmonary disorder.  He will keep me updated on his progress and how the medications are working.   Cc: Dr. Elaina Hoops, MD  [facility] [address] [place],  [address]  Phone [phone] Fax [phone]  A total of 60 minutes was spent face-to-face with this patient. Over half this time was spent on counseling patient on the  1. Episodic  cluster headache, not intractable   2. Essential hypertension    diagnosis and different diagnostic and therapeutic options, counseling and coordination of care, risks ans benefits of management, compliance, or risk factor reduction and education.

## 2019-10-17 ENCOUNTER — Encounter: Payer: Self-pay | Admitting: Neurology

## 2019-10-17 DIAGNOSIS — G44019 Episodic cluster headache, not intractable: Secondary | ICD-10-CM | POA: Insufficient documentation

## 2019-12-31 DIAGNOSIS — R300 Dysuria: Secondary | ICD-10-CM | POA: Diagnosis not present

## 2019-12-31 DIAGNOSIS — Z20822 Contact with and (suspected) exposure to covid-19: Secondary | ICD-10-CM | POA: Diagnosis not present

## 2020-07-02 MED ORDER — AJOVY 225 MG/1.5ML ~~LOC~~ SOAJ: 225.0000 mg | SUBCUTANEOUS | 0 refills | Status: DC

## 2020-07-02 MED ORDER — SUMATRIPTAN SUCCINATE 6 MG/0.5ML ~~LOC~~ SOLN: 6.0000 mg | SUBCUTANEOUS | 6 refills | Status: DC | PRN

## 2020-07-02 MED ORDER — VERAPAMIL HCL ER 240 MG PO CP24: 240.0000 mg | ORAL_CAPSULE | Freq: Every day | ORAL | 6 refills | Status: DC

## 2020-07-02 MED ORDER — NURTEC 75 MG PO TBDP: 75.0000 mg | ORAL_TABLET | Freq: Every day | ORAL | 6 refills | Status: DC | PRN

## 2020-07-02 NOTE — Addendum Note (Signed)
Addended by: Sarina Ill B on: 07/02/2020 09:48 AM   Modules accepted: Orders

## 2020-11-16 ENCOUNTER — Ambulatory Visit: Payer: Self-pay

## 2021-01-15 ENCOUNTER — Telehealth: Payer: Self-pay | Admitting: Neurology

## 2021-01-15 NOTE — Telephone Encounter (Signed)
LVM for pt telling him that we needed to r/s from 4/25 due to Dr. Jaynee Eagles out and switched him to Amy on 4/19. Bethany said VV was okay. Also sent mychart message to let pt know.

## 2021-01-28 ENCOUNTER — Telehealth: Payer: BC Managed Care – PPO | Admitting: Family Medicine

## 2021-02-03 ENCOUNTER — Ambulatory Visit: Payer: BC Managed Care – PPO | Admitting: Neurology

## 2021-02-07 ENCOUNTER — Telehealth: Payer: Self-pay | Admitting: Physician Assistant

## 2021-02-07 DIAGNOSIS — N390 Urinary tract infection, site not specified: Secondary | ICD-10-CM

## 2021-02-07 DIAGNOSIS — N10 Acute tubulo-interstitial nephritis: Secondary | ICD-10-CM

## 2021-02-07 NOTE — Progress Notes (Signed)
Based on what you shared with me, I feel your condition warrants further evaluation and I recommend that you be seen for a face to face office visit.  Male bladder infections are not very common.  We worry about prostate or kidney conditions.  The standard of care is to examine the abdomen and kidneys, and to do a urine and blood test to make sure that something more serious is not going on.  We recommend that you see a provider today.  If your doctor's office is closed [facility] has the following Urgent Cares:    NOTE: If you entered your credit card information for this eVisit, you will not be charged. You may see a "hold" on your card for the $35 but that hold will drop off and you will not have a charge processed.   If you are having a true medical emergency please call 911.     For an urgent face to face visit, [facility] has five urgent care centers for your convenience:     [facility] [facility] [place] Get Driving Directions [phone] [place], [place] [address] . 10 am - 6pm Monday - Friday    [facility] [place] [facility]) Get Driving Directions [phone] [address] [place], [place] [address] . 10 am to 8 pm Monday-Friday . 12 pm to 8 pm [facility] [place]) Get Driving Directions [phone]  [address] [place],  [place]  [address] . 8 am to 8 pm Monday-Friday . 8 am to 4 pm [facility] Get Driving Directions [phone] [place], [place], [place] [address] . 8 am to 8 pm Monday-Friday . 9 am to 6 pm Saturday . 11 am to 6 pm Sunday   [facility] Get Driving Directions  [phone] [address] [place], [place] [address] . 8 am to 8 pm Monday-Friday . 8 am to 4 pm [facility]  at [place] Get Driving Directions [phone] [address] [place], [place] [address] . 12 pm to 6 pm Monday-Friday    VIDEO VISITS: [facility] is now providing on-demand video visits for your convenience. Speak to one of our providers from the comfort of your home 8 am to 8 pm or after hours through our partner vendor.   For Video Visit details and options:   [email or web address]      Your MyChart E-visit questionnaire answers were reviewed by a board certified advanced clinical practitioner to complete your personal care plan based on your specific symptoms.  Thank you for using e-Visits.

## 2021-02-12 ENCOUNTER — Other Ambulatory Visit: Payer: Self-pay | Admitting: Home Modifications

## 2021-02-12 DIAGNOSIS — R319 Hematuria, unspecified: Secondary | ICD-10-CM

## 2021-02-12 DIAGNOSIS — R1011 Right upper quadrant pain: Secondary | ICD-10-CM

## 2021-02-14 ENCOUNTER — Ambulatory Visit
Admission: RE | Admit: 2021-02-14 | Discharge: 2021-02-14 | Disposition: A | Discharge status: Home / Self Care | Payer: 59 | Source: Ambulatory Visit | Attending: Home Modifications | Admitting: Home Modifications

## 2021-02-14 DIAGNOSIS — R1011 Right upper quadrant pain: Secondary | ICD-10-CM

## 2021-02-14 DIAGNOSIS — R319 Hematuria, unspecified: Secondary | ICD-10-CM

## 2021-02-17 ENCOUNTER — Other Ambulatory Visit: Payer: Self-pay

## 2021-02-19 LAB — IRON,TIBC AND FERRITIN PANEL
%SAT: 9
Iron: 34
TIBC: 366

## 2021-02-19 LAB — VITAMIN B12: Vitamin B-12: 827

## 2021-02-19 LAB — CBC AND DIFFERENTIAL
HCT: 41 (ref 41–53)
Hemoglobin: 13.2 — AB (ref 13.5–17.5)
Platelets: 286 (ref 150–399)
WBC: 10.6

## 2021-02-19 LAB — CBC: RBC: 5.63 — AB (ref 3.87–5.11)

## 2021-02-20 ENCOUNTER — Other Ambulatory Visit: Payer: Self-pay | Admitting: Home Modifications

## 2021-02-20 DIAGNOSIS — R109 Unspecified abdominal pain: Secondary | ICD-10-CM

## 2021-02-21 ENCOUNTER — Ambulatory Visit
Admission: RE | Admit: 2021-02-21 | Discharge: 2021-02-21 | Disposition: A | Discharge status: Home / Self Care | Payer: 59 | Source: Ambulatory Visit | Attending: Home Modifications | Admitting: Home Modifications

## 2021-02-21 DIAGNOSIS — R109 Unspecified abdominal pain: Secondary | ICD-10-CM

## 2021-02-21 MED ORDER — IOPAMIDOL (ISOVUE-300) INJECTION 61%
125.0000 mL | Freq: Once | INTRAVENOUS | Status: AC | PRN
  Administered 2021-02-21: 125 mL via INTRAVENOUS

## 2021-03-17 ENCOUNTER — Encounter: Payer: Self-pay | Admitting: Nurse Practitioner

## 2021-03-18 ENCOUNTER — Other Ambulatory Visit: Payer: Self-pay

## 2021-04-08 ENCOUNTER — Other Ambulatory Visit: Payer: Self-pay

## 2021-04-09 ENCOUNTER — Ambulatory Visit: Payer: 59 | Admitting: Nurse Practitioner

## 2021-05-08 ENCOUNTER — Encounter: Payer: Self-pay | Admitting: Nurse Practitioner

## 2021-05-08 ENCOUNTER — Ambulatory Visit (INDEPENDENT_AMBULATORY_CARE_PROVIDER_SITE_OTHER): Payer: 59 | Admitting: Nurse Practitioner

## 2021-05-08 ENCOUNTER — Other Ambulatory Visit (INDEPENDENT_AMBULATORY_CARE_PROVIDER_SITE_OTHER): Payer: 59

## 2021-05-08 VITALS — BP 123/60 | HR 95 | Ht 78.0 in | Wt >= 6400 oz

## 2021-05-08 DIAGNOSIS — D509 Iron deficiency anemia, unspecified: Secondary | ICD-10-CM | POA: Diagnosis not present

## 2021-05-08 DIAGNOSIS — K219 Gastro-esophageal reflux disease without esophagitis: Secondary | ICD-10-CM

## 2021-05-08 DIAGNOSIS — R110 Nausea: Secondary | ICD-10-CM

## 2021-05-08 DIAGNOSIS — R197 Diarrhea, unspecified: Secondary | ICD-10-CM

## 2021-05-08 LAB — CBC
HCT: 40.6 % (ref 39.0–52.0)
Hemoglobin: 13 g/dL (ref 13.0–17.0)
MCHC: 32 g/dL (ref 30.0–36.0)
MCV: 73.4 fl — ABNORMAL LOW (ref 78.0–100.0)
Platelets: 256 10*3/uL (ref 150.0–400.0)
RBC: 5.53 Mil/uL (ref 4.22–5.81)
RDW: 17 % — ABNORMAL HIGH (ref 11.5–15.5)
WBC: 8.3 10*3/uL (ref 4.0–10.5)

## 2021-05-08 LAB — C-REACTIVE PROTEIN: CRP: 1.3 mg/dL (ref 0.5–20.0)

## 2021-05-08 NOTE — H&P (View-Only) (Signed)
05/08/2021 CHUKWUMA STRAUS [id number] [birth year]   CHIEF COMPLAINT: abdominal bloating, abdominal pain, diarrhea   HISTORY OF PRESENT ILLNESS: Koa Zoeller. 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 S/P appendectomy 2005.  He presents to our office today as referred by Eilene Ghazi NP for further evaluation regarding abdominal pain and bloating.   He developed increased urination with profound fatigue and generally felt unwell end of April 2022.  He presented to an urgent care clinic and urinalysis showed trace bacteria, cultures + for E. Coli. He was prescribed Cipro without improvement.  He developed worsening nausea, could not eat and had persistent diarrhea which was more frequent than his typical IBS-D.  He also developed RUQ pain.  He was evaluated by Eilene Ghazi, NP and a RUQ sonogram was done 02/14/2021 which showed a normal gallbladder, the CBD was not identified.  The liver was normal.  He also noted having hematuria.  A renal ultrasound was limited secondary to his body habitus but no nephrosis or kidney stones were identified.  He continued to have RUQ pain which radiated to the right mid abdomen and he had a fever of 100.62F.  He underwent an abdominal/pelvic CT scan with contrast 02/21/2021 identified gallstones without evidence of acute cholecystitis, diverticulosis without evidence of acute diverticulitis and fatty infiltration to the liver.  Aortic atherosclerosis was noted as well.  Currently, his nausea is much better.  No further RUQ pain.  His diarrhea has significantly improved as well.  No bloody diarrhea, no bright red rectal bleeding or melena.  He underwent a colonoscopy by Dr. Carlean Purl 12/04/2005 due to having diarrhea which was normal and he was diagnosed with IBS-D.  Regarding his IBS-D, he  typically has 1 or 2 days of diarrhea monthly, on these days he will passed 5-10 episodes of nonbloody watery to mud like diarrhea with abdominal cramping.  No significant abdominal pain.  He typically takes Imodium and Pepto with symptom relief.  The days he does not have diarrhea he passes a normal formed stool, soft stool or pellet-like stool.  He is on Omeprazole daily since the age of 61 which she stated was it initially prescribed by his pulmonologist to prevent possible reflux related asthma symptoms.  He is now dependent on Omeprazole, if he skips a few doses he develops significant heartburn.  No dysphagia.  Never had an EGD. Negative food allergy testing many years ago. Paternal great grandmother died from colon cancer in her 76s or 79s.  Paternal uncle was diagnosed with colon cancer around age 29.  No known family history of celiac disease or IBD.  Laboratory studies 02/12/2021: WBC 6.7.  Hemoglobin 12.8.  Hematocrit 39.2.  Lipase 42.  Total bili 0.7.  Alk phos 54.  AST 30.  ALT 32.  BUN 8.  Creatinine 1.0.  Laboratory studies 02/19/2021: Iron 34.  Iron saturation 9%.  TIBC 366.  Hemoglobin 13.2 (base line Hg 13.7 - 14.2.). Hematocrit 41.  MCV 73.  Platelet 286.  Vitamin B12 827.  Folate 12.1.  His recent laboratory studies show he has iron deficiency. He denies taking any iron supplement.   RUQ sonogram 02/14/2021: 1. Normal gallbladder. 2. Common bile duct not identified. Limited evaluation liver. Suboptimal exam due to body habitus  Renal ultrasound 02/14/2021: Limited assessment due to patient body habitus. No  hydronephrosis or shadowing calculi.  CTAP 02/23/2021: 1. No acute findings within the abdomen or pelvis. No bowel obstruction or evidence of bowel wall inflammation. No evidence of acute solid organ abnormality. No renal or obstructive ureteral calculi. 2. Fatty infiltration of the liver. 3. Cholelithiasis without evidence of acute cholecystitis. 4. Colonic diverticulosis  without evidence of acute diverticulitis.  Aortic Atherosclerosis  Past Medical History:  Diagnosis Date   Acid reflux    ADD 09/13/2007   ALLERGIC RHINITIS 06/10/2009   ASTHMA, UNSPECIFIED, UNSPECIFIED STATUS 07/19/2008   DEPRESSIVE DISORDER 63/12/3543   DYSMETABOLIC SYNDROME 04/05/6388   GERD 09/13/2007   HYPERLIPIDEMIA 10/10/2009   HYPERTENSION 09/13/2007   IBS 03/20/2009   Morbid obesity ([place]) 07/19/2008   Skin cancer    Past Surgical History:  Procedure Laterality Date   ANKLE SURGERY  2007   APPENDECTOMY  2005   IR GENERIC HISTORICAL  07/23/2016   IR FLUORO GUIDED NEEDLE PLC ASPIRATION/INJECTION LOC 07/23/2016 Arne Cleveland, MD [facility]   SKIN CANCER EXCISION     teenager   TEE WITHOUT CARDIOVERSION N/A 04/15/2016   Procedure: TRANSESOPHAGEAL ECHOCARDIOGRAM (TEE);  Surgeon: Sanda Klein, MD;  Location: [facility] ENDOSCOPY;  Service: Cardiovascular;  Laterality: N/A;  He awakened during colonoscopy in 2007, moderate anesthesia care with Fentanyl 150 mcg. given IV and Versed 14 given IV was administered during this procedure.  He denies any problems with general anesthesia or airway management/intubation with his past surgeries.  Social History: Male partner. Non-smoker.  No alcohol use.  No drug use.  Family History: Father with hypertension.  Mother with rheumatoid arthritis, asthma, clotting disorder and MI x 2.  Paternal great grandmother died colon cancer in her 56's or70's.  Paternal uncle diagnosed colon cancer diagnosed at 46 or 19.  Maternal grandmother with lung cancer.   Allergies  Allergen Reactions   Citrus Anaphylaxis and Other (See Comments)    Lemon   Amoxicillin-Pot Clavulanate Nausea Only   Sulfamethoxazole-Trimethoprim Other (See Comments)    Does not remember, long ago   Vancomycin Other (See Comments)    Mild red mans syndrome, infuse more slowly.     Outpatient Encounter Medications as of 05/08/2021  Medication Sig   ALPRAZolam (XANAX) 1 MG tablet Take 1  tablet (1 mg total) by mouth 2 (two) times daily as needed for anxiety.   BL CALCIUM-MAGNESIUM-ZINC PO Take by mouth.   buPROPion (WELLBUTRIN) 75 MG tablet TAKE 1 TABLET BY MOUTH EVERY DAY   cetirizine (ZYRTEC) 10 MG tablet 1 tablet   EPINEPHrine 0.3 mg/0.3 mL IJ SOAJ injection Inject into the muscle once.   Fremanezumab-vfrm (AJOVY) 225 MG/1.5ML SOAJ Inject 225 mg into the skin every 30 (thirty) days.   furosemide (LASIX) 20 MG tablet Take 1 tablet (20 mg total) by mouth daily. (Patient taking differently: Take 20 mg by mouth daily as needed.)   irbesartan-hydrochlorothiazide (AVALIDE) 300-12.5 MG tablet TAKE 1 TABLET BY MOUTH EVERY DAY NEEDS APPT FOR REFILLS   omeprazole (PRILOSEC) 40 MG capsule TAKE 1 CAPSULE BY MOUTH TWICE A DAY   potassium chloride (KLOR-CON) 8 MEQ tablet Take 8 mEq by mouth daily.   PROAIR HFA 108 (90 Base) MCG/ACT inhaler INHALE 1-2 PUFFS EVERY 4-6 HOURS AS NEEDED FOR PERSISTENT COUGH OR INCREASED WORK OF BREATHING   Rimegepant Sulfate (NURTEC) 75 MG TBDP Take 75 mg by mouth daily as needed. For migraines. Take as close to onset of migraine as possible. One daily maximum.   SUMAtriptan (IMITREX) 6 MG/0.5ML SOLN injection Inject  0.5 mLs (6 mg total) into the skin every 2 (two) hours as needed for migraine or headache. Take one dose at headache onset, can take additional dose 2hrs later if needed. No more then 2 injections in 24hrs   verapamil (VERELAN PM) 240 MG 24 hr capsule Take 1 capsule (240 mg total) by mouth daily.   [DISCONTINUED] Rimegepant Sulfate (NURTEC) 75 MG TBDP Take 75 mg by mouth daily as needed. For migraines. Take as close to onset of migraine as possible. One daily maximum.   [DISCONTINUED] ciprofloxacin (CIPRO) 250 MG tablet 1 tablet (Patient not taking: Reported on 05/08/2021)   [DISCONTINUED] phenazopyridine (PYRIDIUM) 200 MG tablet Take 200 mg by mouth 3 (three) times daily. (Patient not taking: Reported on 05/08/2021)   No facility-administered encounter  medications on file as of 05/08/2021.    REVIEW OF SYSTEMS:  Gen: + 5 lbs weight loss. + Fatigue. Denies fever, sweats or chills. CV: Denies chest pain, palpitations or edema. Resp: Denies cough, shortness of breath of hemoptysis.  GI: See HPI  GU : See HPI. MS: + Lower back pain.  Derm: Denies rash, itchiness, skin lesions or unhealing ulcers. Psych: + Anxiety and depression.  Heme: Denies bruising, bleeding. Neuro:  + Headaches.  Endo:  Denies any problems with DM, thyroid or adrenal function.  PHYSICAL EXAM: BP 123/60   Pulse 95   Ht _0  (1.981 m)   Wt (!) 483 lb (219.1 kg)   SpO2 98%   BMI 55.82 kg/m  Wt Readings from Last 3 Encounters:  05/08/21 (!) 483 lb (219.1 kg)  10/16/19 (!) 456 lb (206.8 kg)  11/15/18 (!) 424 lb 12 oz (192.7 kg)    General: 35 year old obese white male in no acute distress. Head: Normocephalic and atraumatic. Eyes:  Sclerae non-icteric, conjunctive pink. Ears: Normal auditory acuity. Mouth: Dentition intact. No ulcers or lesions.  Neck: Supple, no lymphadenopathy or thyromegaly.  Lungs: Clear bilaterally to auscultation without wheezes, crackles or rhonchi. Heart: Regular rate and rhythm. No murmur, rub or gallop appreciated.  Abdomen: Soft, nontender, non distended. No masses. No hepatosplenomegaly. Normoactive bowel sounds x 4 quadrants.  Rectal: Deferred. Musculoskeletal: Symmetrical with no gross deformities. Skin: Warm and dry. No rash or lesions on visible extremities. Extremities: No edema. Neurological: Alert oriented x 4, no focal deficits.  Psychological:  Alert and cooperative. Normal mood and affect.  ASSESSMENT AND PLAN:  19. 35 year old male with a history of GERD symptoms dependent on PPI with recent nausea and RUQ pain which has resolved. CTAP showed gallstones without evidence of biliary duct dilatation or cholecystitis, diverticulosis without evidence of diverticulitis.  RUQ sonogram showed a normal gallbladder. Normal LFTs  and lipase level.  -EGD benefits and risks discussed including risk with sedation, risk of bleeding, perforation and infection  -Continue Omeprazole 40 mg daily  2.  IBS-D with associated lower abdominal cramping.  Family history of colon cancer  -Pepto-Bismol or Imodium 2 tabs twice daily as needed -CRP -Colonoscopy benefits and risks discussed including risk with sedation, risk of bleeding, perforation and infection   3.  IDA -EGD and colonoscopy benefits and risks discussed including risk with sedation, risk of bleeding, perforation and infection  -CBC, iron, iron saturation, TIBC, ferritin, IgA and TTG level -Await the above lab results before prescribing oral iron  4.  Recurrent UTI, PCP reports hematuria -Recommend urology consult if not already initiated  5.  Fatty liver per CTAP.  Normal LFTs -Weight loss recommended, exercise as tolerated, reduce carbohydrate  intake to reduce the risk of developing fatty liver disease was discussed with the patient   Patient to call our office if symptoms worsen Further follow-up to be determined after the above evaluation completed       CC:  Janith Lima, MD

## 2021-05-08 NOTE — Progress Notes (Signed)
05/08/2021 CHUKWUMA STRAUS [id number] [birth year]   CHIEF COMPLAINT: abdominal bloating, abdominal pain, diarrhea   HISTORY OF PRESENT ILLNESS: Alexis [name]. 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 S/P appendectomy 2005.  He presents to our office today as referred by Eilene Ghazi NP for further evaluation regarding abdominal pain and bloating.   He developed increased urination with profound fatigue and generally felt unwell end of April 2022.  He presented to an urgent care clinic and urinalysis showed trace bacteria, cultures + for E. Coli. He was prescribed Cipro without improvement.  He developed worsening nausea, could not eat and had persistent diarrhea which was more frequent than his typical IBS-D.  He also developed RUQ pain.  He was evaluated by Eilene Ghazi, NP and a RUQ sonogram was done 02/14/2021 which showed a normal gallbladder, the CBD was not identified.  The liver was normal.  He also noted having hematuria.  A renal ultrasound was limited secondary to his body habitus but no nephrosis or kidney stones were identified.  He continued to have RUQ pain which radiated to the right mid abdomen and he had a fever of 100.62F.  He underwent an abdominal/pelvic CT scan with contrast 02/21/2021 identified gallstones without evidence of acute cholecystitis, diverticulosis without evidence of acute diverticulitis and fatty infiltration to the liver.  Aortic atherosclerosis was noted as well.  Currently, his nausea is much better.  No further RUQ pain.  His diarrhea has significantly improved as well.  No bloody diarrhea, no bright red rectal bleeding or melena.  He underwent a colonoscopy by Dr. Carlean Purl 12/04/2005 due to having diarrhea which was normal and he was diagnosed with IBS-D.  Regarding his IBS-D, he  typically has 1 or 2 days of diarrhea monthly, on these days he will passed 5-10 episodes of nonbloody watery to mud like diarrhea with abdominal cramping.  No significant abdominal pain.  He typically takes Imodium and Pepto with symptom relief.  The days he does not have diarrhea he passes a normal formed stool, soft stool or pellet-like stool.  He is on Omeprazole daily since the age of 61 which she stated was it initially prescribed by his pulmonologist to prevent possible reflux related asthma symptoms.  He is now dependent on Omeprazole, if he skips a few doses he develops significant heartburn.  No dysphagia.  Never had an EGD. Negative food allergy testing many years ago. Paternal great grandmother died from colon cancer in her 76s or 79s.  Paternal uncle was diagnosed with colon cancer around age 29.  No known family history of celiac disease or IBD.  Laboratory studies 02/12/2021: WBC 6.7.  Hemoglobin 12.8.  Hematocrit 39.2.  Lipase 42.  Total bili 0.7.  Alk phos 54.  AST 30.  ALT 32.  BUN 8.  Creatinine 1.0.  Laboratory studies 02/19/2021: Iron 34.  Iron saturation 9%.  TIBC 366.  Hemoglobin 13.2 (base line Hg 13.7 - 14.2.). Hematocrit 41.  MCV 73.  Platelet 286.  Vitamin B12 827.  Folate 12.1.  His recent laboratory studies show he has iron deficiency. He denies taking any iron supplement.   RUQ sonogram 02/14/2021: 1. Normal gallbladder. 2. Common bile duct not identified. Limited evaluation liver. Suboptimal exam due to body habitus  Renal ultrasound 02/14/2021: Limited assessment due to patient body habitus. No  hydronephrosis or shadowing calculi.  CTAP 02/23/2021: 1. No acute findings within the abdomen or pelvis. No bowel obstruction or evidence of bowel wall inflammation. No evidence of acute solid organ abnormality. No renal or obstructive ureteral calculi. 2. Fatty infiltration of the liver. 3. Cholelithiasis without evidence of acute cholecystitis. 4. Colonic diverticulosis  without evidence of acute diverticulitis.  Aortic Atherosclerosis  Past Medical History:  Diagnosis Date   Acid reflux    ADD 09/13/2007   ALLERGIC RHINITIS 06/10/2009   ASTHMA, UNSPECIFIED, UNSPECIFIED STATUS 07/19/2008   DEPRESSIVE DISORDER 63/12/3543   DYSMETABOLIC SYNDROME 04/05/6388   GERD 09/13/2007   HYPERLIPIDEMIA 10/10/2009   HYPERTENSION 09/13/2007   IBS 03/20/2009   Morbid obesity ([place]) 07/19/2008   Skin cancer    Past Surgical History:  Procedure Laterality Date   ANKLE SURGERY  2007   APPENDECTOMY  2005   IR GENERIC HISTORICAL  07/23/2016   IR FLUORO GUIDED NEEDLE PLC ASPIRATION/INJECTION LOC 07/23/2016 Arne Cleveland, MD [facility]   SKIN CANCER EXCISION     teenager   TEE WITHOUT CARDIOVERSION N/A 04/15/2016   Procedure: TRANSESOPHAGEAL ECHOCARDIOGRAM (TEE);  Surgeon: Sanda Klein, MD;  Location: [facility] ENDOSCOPY;  Service: Cardiovascular;  Laterality: N/A;  He awakened during colonoscopy in 2007, moderate anesthesia care with Fentanyl 150 mcg. given IV and Versed 14 given IV was administered during this procedure.  He denies any problems with general anesthesia or airway management/intubation with his past surgeries.  Social History: Male partner. Non-smoker.  No alcohol use.  No drug use.  Family History: Father with hypertension.  Mother with rheumatoid arthritis, asthma, clotting disorder and MI x 2.  Paternal great grandmother died colon cancer in her 56's or70's.  Paternal uncle diagnosed colon cancer diagnosed at 46 or 19.  Maternal grandmother with lung cancer.   Allergies  Allergen Reactions   Citrus Anaphylaxis and Other (See Comments)    Lemon   Amoxicillin-Pot Clavulanate Nausea Only   Sulfamethoxazole-Trimethoprim Other (See Comments)    Does not remember, long ago   Vancomycin Other (See Comments)    Mild red mans syndrome, infuse more slowly.     Outpatient Encounter Medications as of 05/08/2021  Medication Sig   ALPRAZolam (XANAX) 1 MG tablet Take 1  tablet (1 mg total) by mouth 2 (two) times daily as needed for anxiety.   BL CALCIUM-MAGNESIUM-ZINC PO Take by mouth.   buPROPion (WELLBUTRIN) 75 MG tablet TAKE 1 TABLET BY MOUTH EVERY DAY   cetirizine (ZYRTEC) 10 MG tablet 1 tablet   EPINEPHrine 0.3 mg/0.3 mL IJ SOAJ injection Inject into the muscle once.   Fremanezumab-vfrm (AJOVY) 225 MG/1.5ML SOAJ Inject 225 mg into the skin every 30 (thirty) days.   furosemide (LASIX) 20 MG tablet Take 1 tablet (20 mg total) by mouth daily. (Patient taking differently: Take 20 mg by mouth daily as needed.)   irbesartan-hydrochlorothiazide (AVALIDE) 300-12.5 MG tablet TAKE 1 TABLET BY MOUTH EVERY DAY NEEDS APPT FOR REFILLS   omeprazole (PRILOSEC) 40 MG capsule TAKE 1 CAPSULE BY MOUTH TWICE A DAY   potassium chloride (KLOR-CON) 8 MEQ tablet Take 8 mEq by mouth daily.   PROAIR HFA 108 (90 Base) MCG/ACT inhaler INHALE 1-2 PUFFS EVERY 4-6 HOURS AS NEEDED FOR PERSISTENT COUGH OR INCREASED WORK OF BREATHING   Rimegepant Sulfate (NURTEC) 75 MG TBDP Take 75 mg by mouth daily as needed. For migraines. Take as close to onset of migraine as possible. One daily maximum.   SUMAtriptan (IMITREX) 6 MG/0.5ML SOLN injection Inject  0.5 mLs (6 mg total) into the skin every 2 (two) hours as needed for migraine or headache. Take one dose at headache onset, can take additional dose 2hrs later if needed. No more then 2 injections in 24hrs   verapamil (VERELAN PM) 240 MG 24 hr capsule Take 1 capsule (240 mg total) by mouth daily.   [DISCONTINUED] Rimegepant Sulfate (NURTEC) 75 MG TBDP Take 75 mg by mouth daily as needed. For migraines. Take as close to onset of migraine as possible. One daily maximum.   [DISCONTINUED] ciprofloxacin (CIPRO) 250 MG tablet 1 tablet (Patient not taking: Reported on 05/08/2021)   [DISCONTINUED] phenazopyridine (PYRIDIUM) 200 MG tablet Take 200 mg by mouth 3 (three) times daily. (Patient not taking: Reported on 05/08/2021)   No facility-administered encounter  medications on file as of 05/08/2021.    REVIEW OF SYSTEMS:  Gen: + 5 lbs weight loss. + Fatigue. Denies fever, sweats or chills. CV: Denies chest pain, palpitations or edema. Resp: Denies cough, shortness of breath of hemoptysis.  GI: See HPI  GU : See HPI. MS: + Lower back pain.  Derm: Denies rash, itchiness, skin lesions or unhealing ulcers. Psych: + Anxiety and depression.  Heme: Denies bruising, bleeding. Neuro:  + Headaches.  Endo:  Denies any problems with DM, thyroid or adrenal function.  PHYSICAL EXAM: BP 123/60   Pulse 95   Ht _0  (1.981 m)   Wt (!) 483 lb (219.1 kg)   SpO2 98%   BMI 55.82 kg/m  Wt Readings from Last 3 Encounters:  05/08/21 (!) 483 lb (219.1 kg)  10/16/19 (!) 456 lb (206.8 kg)  11/15/18 (!) 424 lb 12 oz (192.7 kg)    General: 35 year old obese white male in no acute distress. Head: Normocephalic and atraumatic. Eyes:  Sclerae non-icteric, conjunctive pink. Ears: Normal auditory acuity. Mouth: Dentition intact. No ulcers or lesions.  Neck: Supple, no lymphadenopathy or thyromegaly.  Lungs: Clear bilaterally to auscultation without wheezes, crackles or rhonchi. Heart: Regular rate and rhythm. No murmur, rub or gallop appreciated.  Abdomen: Soft, nontender, non distended. No masses. No hepatosplenomegaly. Normoactive bowel sounds x 4 quadrants.  Rectal: Deferred. Musculoskeletal: Symmetrical with no gross deformities. Skin: Warm and dry. No rash or lesions on visible extremities. Extremities: No edema. Neurological: Alert oriented x 4, no focal deficits.  Psychological:  Alert and cooperative. Normal mood and affect.  ASSESSMENT AND PLAN:  19. 35 year old male with a history of GERD symptoms dependent on PPI with recent nausea and RUQ pain which has resolved. CTAP showed gallstones without evidence of biliary duct dilatation or cholecystitis, diverticulosis without evidence of diverticulitis.  RUQ sonogram showed a normal gallbladder. Normal LFTs  and lipase level.  -EGD benefits and risks discussed including risk with sedation, risk of bleeding, perforation and infection  -Continue Omeprazole 40 mg daily  2.  IBS-D with associated lower abdominal cramping.  Family history of colon cancer  -Pepto-Bismol or Imodium 2 tabs twice daily as needed -CRP -Colonoscopy benefits and risks discussed including risk with sedation, risk of bleeding, perforation and infection   3.  IDA -EGD and colonoscopy benefits and risks discussed including risk with sedation, risk of bleeding, perforation and infection  -CBC, iron, iron saturation, TIBC, ferritin, IgA and TTG level -Await the above lab results before prescribing oral iron  4.  Recurrent UTI, PCP reports hematuria -Recommend urology consult if not already initiated  5.  Fatty liver per CTAP.  Normal LFTs -Weight loss recommended, exercise as tolerated, reduce carbohydrate  intake to reduce the risk of developing fatty liver disease was discussed with the patient   Patient to call our office if symptoms worsen Further follow-up to be determined after the above evaluation completed       CC:  Janith Lima, MD

## 2021-05-08 NOTE — Patient Instructions (Signed)
PROCEDURES: You have been scheduled for a colonoscopy. Please follow the written instructions given to you at your visit today. If you use inhalers (even only as needed), please bring them with you on the day of your procedure.  LABS:  Lab work has been ordered for you today. Our lab is located in the basement. Press "B" on the elevator. The lab is located at the first door on the left as you exit the elevator.  HEALTHCARE LAWS AND MY CHART RESULTS: Due to recent changes in healthcare laws, you may see the results of your imaging and laboratory studies on MyChart before your provider has had a chance to review them.   We understand that in some cases there may be results that are confusing or concerning to you. Not all laboratory results come back in the same time frame and the provider may be waiting for multiple results in order to interpret others.  Please give [place] 48 hours in order for your provider to thoroughly review all the results before contacting the office for clarification of your results.   RECOMMENDATIONS: Pepto/Imodium 1-2 tabs twice a day as needed. Benefiber- 1 tablespoon daily.  Please call our office if your symptoms worsen. It was great seeing you today! Thank you for entrusting me with your care and choosing [facility].  Noralyn Pick, CRNP  If you are age 35 or younger, your body mass index should be between 19-25. Your Body mass index is 55.82 kg/m. If this is out of the aformentioned range listed, please consider follow up with your Primary Care Provider.   The [place] [facility] providers would like to encourage you to use [facility] to communicate with providers for non-urgent requests or questions.  Due to long hold times on the telephone, sending your provider a message by [facility] may be faster and more efficient way to get a response. Please allow 48 business hours for a response.  Please remember that this is for non-urgent requests/questions.

## 2021-05-09 LAB — IGA: Immunoglobulin A: 563 mg/dL — ABNORMAL HIGH (ref 47–310)

## 2021-05-09 LAB — IRON,TIBC AND FERRITIN PANEL
%SAT: 9 % (calc) — ABNORMAL LOW (ref 20–48)
Ferritin: 25 ng/mL — ABNORMAL LOW (ref 38–380)
Iron: 28 ug/dL — ABNORMAL LOW (ref 50–180)
TIBC: 309 mcg/dL (calc) (ref 250–425)

## 2021-05-09 LAB — TISSUE TRANSGLUTAMINASE ABS,IGG,IGA
(tTG) Ab, IgA: <1 U/mL
(tTG) Ab, IgG: 2.2 U/mL

## 2021-05-28 ENCOUNTER — Telehealth: Payer: Self-pay

## 2021-05-28 ENCOUNTER — Encounter: Payer: Self-pay | Admitting: Neurology

## 2021-05-28 ENCOUNTER — Ambulatory Visit: Payer: 59 | Admitting: Neurology

## 2021-05-28 VITALS — BP 137/92 | HR 102 | Ht 78.0 in | Wt >= 6400 oz

## 2021-05-28 DIAGNOSIS — G43709 Chronic migraine without aura, not intractable, without status migrainosus: Secondary | ICD-10-CM

## 2021-05-28 DIAGNOSIS — G43109 Migraine with aura, not intractable, without status migrainosus: Secondary | ICD-10-CM

## 2021-05-28 DIAGNOSIS — G44019 Episodic cluster headache, not intractable: Secondary | ICD-10-CM

## 2021-05-28 DIAGNOSIS — I10 Essential (primary) hypertension: Secondary | ICD-10-CM

## 2021-05-28 MED ORDER — UBRELVY 100 MG PO TABS: 100.0000 mg | ORAL_TABLET | ORAL | 6 refills | Status: DC | PRN

## 2021-05-28 MED ORDER — RIZATRIPTAN BENZOATE 10 MG PO TBDP: 10.0000 mg | ORAL_TABLET | ORAL | 11 refills | Status: DC | PRN

## 2021-05-28 MED ORDER — AJOVY 225 MG/1.5ML ~~LOC~~ SOAJ: 225.0000 mg | SUBCUTANEOUS | 11 refills | Status: DC

## 2021-05-28 MED ORDER — VERAPAMIL HCL ER 240 MG PO CP24: 240.0000 mg | ORAL_CAPSULE | Freq: Every day | ORAL | 6 refills | Status: DC

## 2021-05-28 MED ORDER — ONDANSETRON 4 MG PO TBDP: 4.0000 mg | ORAL_TABLET | Freq: Three times a day (TID) | ORAL | 11 refills | Status: DC | PRN

## 2021-05-28 NOTE — Progress Notes (Signed)
[name]    Provider:  Dr Alexis [name] Referring Provider: Janith Lima, MD Primary Care Physician:  Alexis Stalker, PA-C   CC:  Headache  HPI: His headaches get worse with stress, he has changed jobs, he had a sleep study several years and was borderline, headaches not predominantly in the mornng, more at the end of the day, clusters have not really come back, still on the verapamil, stopped the ajovy but that helped tremendously and when he was on that he would have 1-2 migraines a month that would be very manageable. Without the ajovy he is taking advil. He can have 7-8/10 in pain and 15 migraine days a month for over 6 months since stopping the Ajovy we will reinitiate Ajovy. Nurtec did not work.   HPI 10/16/2019: This is a patient referred from Dr. Ronnald [name] for migraines and cluster headaches.  I have not seen him for over 3 years.  He has a long history of migraines.  Work-up in the past included MRI of the brain, MRV, lumbar puncture for evaluation of intracranial idiopathic hypertension or other causes of headache and was negative.  He has a family history of migraines in his mother.  In the past he was on Topamax but since last I saw him he is not on topamax. 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 He was doing well until this year only having one migraine a month. Over the last 3 months since a sinus infection he is having more headaches, only on the left side, severe pain, daily, he had tearing of the eye, severe jabs, can last up to an hour, a few times a day, he wakes up with it.  They can happen several times a day.  They are severe, he paces, he is tried multiple acute management medications such as over-the-counter Tylenol, Benadryl, ibuprofen and this does not help.  He is not sure of the sinus infection had anything to do with precipitating this.  He has had these in the past, last time was several years ago, very consistent with cluster  headaches.  We discussed the difference between cluster headaches and migraines.  I do feel as though he has both and there is higher incidence and migraine auras with other kinds of headaches.  Cluster headaches are also more common in males. No other focal neurologic deficits, associated symptoms, inciting events or modifiable factors.  Interval update 06/09/2016: 35 year old male with a past medical history of migraines and morbid obesity here for vision changes, possible papilledema seen on ophthalmology examination recently. [place] and his vision went blurry, very fuzzy and blurry for about 4-5 minutes.  Happened 3 times, it was significant. Both eyes. Happened again the next day. It was not an aura, it was blurry. Went away eventually, blinking eyes. He continues to have episodes of significant vision changes. He is having 1-2 significant migraine a month. He has had episodes of feeling like his head is going to explode, severe pressure all around the head with 10 out of 10 pain. Not worse with laying down, he has noticed some new hearing changes, more of a ringing. Discussion with patient and his mother. Symptoms are concerning for intracranial hypertension. Need to rule out tumors or masses causing the increased pressure as well as other intracranial etiologies. Discussed all the imaging needed as well as lumbar puncture and reasons why. Also discussed possible findings and how we would  treat them. If consistent with idiopathic intracranial hypertension would likely start on Diamox. Patient has been on doxycycline recently for multiple infections and other medications as well, he recently spent 14 days in the hospital for infections and sepsis. No history of high doses of vitamin A use or other medications that are associated with pseudotumor cerebri. Discussed that weight loss is key in pseudotumor cerebri. Also discussed that untreated can cause permanent vision loss. She does  vision or headaches worsen he is to proceed to the emergency room immediately.   Interval Update 02/27/2016:  He ws at work. Last Wednesday. He had acute onset of left eye vision loss. Then had a headache and lasted all day. Would not go away. Typical headache migraines, left side, throbbing, light sensitivity, sound sensitivity. He has a lot of light and sound sensitivity. No nausea or vomiting with the headaches. He has 4-5 headache days a month. Not typically any are migrainous. Had taken imitrex orally in the past. Migraines can be quick. He wakes up with a headache. He had a sleep study and he does not have sleep apnea. Did not need a cpap. They are not serious, they tend to go away quickly. Not often does he have the headaches. He is on Topamax and Verapamil and doing well.    Initial visit 11/14/2014 :  Alexis [name] is a 35 y.o. male here as a referral from Dr. Ronnald [name] for headache.    Migraines are well controlled if he takes the indomethacin. If he falls asleep and forgets the indomethacin on the couch he will wake up with a headache. He will wake up at 4am with a headache. He is on Verapamil and has not missed a dose. He does not have a lot of nausea with the migraines.  No signs of OSA.    He is on Wellbutrin. He says that he has been well on Wellbutrin for many years but he is unsure if it is helping him anymore. He has decreased energy and decreased concentration and he has gained weight and that makes him depressed.   Discussed that people with headaches often have decreased serotonin levels and that if location is not helping maybe we should change to an SSRI.       11/14/2014: Patient is here as an add on for new onset severe headache. PMHx of occular migraines. He is on '120mg'$  verapamil and completely controlled. Friday night they were at the theatre. The most he laughed the pressure was building in his head. It is on the left side. Throbbing is constant. He also feels stabbing in the brain in  the temporal area. He has constant pain Extremely painful. The pain was so bad that his BP shot up when he was on telemetry in the ED. The pain is constant but then he has periods of elevated pain in the head, the pain stabs and holds for a minute. The worst pain he has ever felt in his life. Severe 10/10. Eye waters, keeps getting hot flashes, clammy feeling. +light sensitivity. It has been continuous. No weakness, vision. Has some pulsatile hearing changes. He has been sick recently and has a sore throat and a sinus infection.   Reviewed notes, labs and imaging from outside physicians, which showed: MRI brain w/wo contrast 04/2012 showed no acute intracranial abnormalities including mass lesion or mass effect, hydrocephalus, extra-axial fluid collection, midline shift, hemorrhage, or acute infarction, large ischemic events (personally reviewed images). BMP/CMP unremarkable.    Review of  Systems: Patient complains of symptoms per HPI as well as the following symptoms: headache, weight gain. Pertinent negatives per HPI. All others negative.  Social History   Socioeconomic History   Marital status: Significant Other    Spouse name: Not on file   Number of children: 0   Years of education: College   Highest education level: Not on file  Occupational History   Occupation: TEFL teacher: [employer]   Occupation: EMT  Tobacco Use   Smoking status: Never   Smokeless tobacco: Never  Vaping Use   Vaping Use: Never used  Substance and Sexual Activity   Alcohol use: No    Comment: socially   Drug use: No   Sexual activity: Yes  Other Topics Concern   Not on file  Social History Narrative   Lives at home with parents.   Works at [employer].   Single.   Caffeine use: 3-4 cups daily.    Social Determinants of Health   Financial Resource Strain: Not on file  Food Insecurity: Not on file  Transportation Needs: Not on file  Physical Activity: Not on file  Stress: Not on file   Social Connections: Not on file  Intimate Partner Violence: Not on file    Family History  Problem Relation Age of Onset   Asthma Mother    Clotting disorder Mother        Factor 5   Rheum arthritis Mother    Skin cancer Mother    Heart attack Mother    Migraines Mother    Hypertension Father    Diabetes Father        type 2    Clotting disorder Maternal Uncle        Factor 5   Emphysema Maternal Grandmother        smoker   Asthma Maternal Grandmother    Lung cancer Maternal Grandmother    Colon polyps Paternal Grandmother    Asthma Paternal Grandmother    Diabetes Paternal Grandmother        type 2   Allergies Other        whole family-both sides of family   Heart disease Other        MGM deceased with MI; both sides of grandparents    Past Medical History:  Diagnosis Date   Acid reflux    ADD 09/13/2007   ALLERGIC RHINITIS 06/10/2009   ASTHMA, UNSPECIFIED, UNSPECIFIED STATUS 07/19/2008   DEPRESSIVE DISORDER 0000000   DYSMETABOLIC SYNDROME 0000000   GERD 09/13/2007   Headache    HYPERLIPIDEMIA 10/10/2009   HYPERTENSION 09/13/2007   IBS 03/20/2009   Morbid obesity ([place]) 07/19/2008   Skin cancer     Past Surgical History:  Procedure Laterality Date   ANKLE SURGERY  2007   APPENDECTOMY  2005   IR GENERIC HISTORICAL  07/23/2016   IR FLUORO GUIDED NEEDLE PLC ASPIRATION/INJECTION LOC 07/23/2016 Arne Cleveland, MD [facility]   SKIN CANCER EXCISION     teenager   TEE WITHOUT CARDIOVERSION N/A 04/15/2016   Procedure: TRANSESOPHAGEAL ECHOCARDIOGRAM (TEE);  Surgeon: Sanda Klein, MD;  Location: [facility] ENDOSCOPY;  Service: Cardiovascular;  Laterality: N/A;    Current Outpatient Medications  Medication Sig Dispense Refill   ALPRAZolam (XANAX) 1 MG tablet Take 1 tablet (1 mg total) by mouth 2 (two) times daily as needed for anxiety. 30 tablet 3   BL CALCIUM-MAGNESIUM-ZINC PO Take by mouth.     buPROPion (WELLBUTRIN) 75 MG tablet TAKE  1 TABLET BY MOUTH EVERY  DAY 90 tablet 1   cetirizine (ZYRTEC) 10 MG tablet 1 tablet     EPINEPHrine 0.3 mg/0.3 mL IJ SOAJ injection Inject into the muscle once.     Fremanezumab-vfrm (AJOVY) 225 MG/1.5ML SOAJ Inject 225 mg into the skin every 30 (thirty) days. 9 mL 0   furosemide (LASIX) 20 MG tablet Take 1 tablet (20 mg total) by mouth daily. (Patient taking differently: Take 20 mg by mouth daily as needed.) 30 tablet 3   irbesartan-hydrochlorothiazide (AVALIDE) 300-12.5 MG tablet TAKE 1 TABLET BY MOUTH EVERY DAY NEEDS APPT FOR REFILLS 90 tablet 1   omeprazole (PRILOSEC) 40 MG capsule TAKE 1 CAPSULE BY MOUTH TWICE A DAY 180 capsule 1   potassium chloride (KLOR-CON) 8 MEQ tablet Take 8 mEq by mouth daily.     PROAIR HFA 108 (90 Base) MCG/ACT inhaler INHALE 1-2 PUFFS EVERY 4-6 HOURS AS NEEDED FOR PERSISTENT COUGH OR INCREASED WORK OF BREATHING  0   verapamil (VERELAN PM) 240 MG 24 hr capsule Take 1 capsule (240 mg total) by mouth daily. 90 capsule 6   No current facility-administered medications for this visit.    Allergies as of 05/28/2021 - Review Complete 05/28/2021  Allergen Reaction Noted   Citrus Anaphylaxis and Other (See Comments) 09/04/2012   Amoxicillin-pot clavulanate Nausea Only    Sulfamethoxazole-trimethoprim Other (See Comments)    Vancomycin Other (See Comments) 04/11/2016    Vitals: BP (!) 137/92   Pulse (!) 102   Ht '6\' 6"'$  (1.981 m)   Wt (!) 481 lb (218.2 kg)   BMI 55.59 kg/m  Last Weight:  Wt Readings from Last 1 Encounters:  05/28/21 (!) 481 lb (218.2 kg)   Last Height:   Ht Readings from Last 1 Encounters:  05/28/21 '6\' 6"'$  (1.981 m)    Physical exam: Exam: Gen: NAD, conversant, well nourised, obese, well groomed                     CV: RRR, no MRG. No Carotid Bruits. No peripheral edema, warm, nontender Eyes: Conjunctivae clear without exudates or hemorrhage  Neuro: Detailed Neurologic Exam  Speech:    Speech is normal; fluent and spontaneous with normal comprehension.   Cognition:    The patient is oriented to person, place, and time;     recent and remote memory intact;     language fluent;     normal attention, concentration,     fund of knowledge Cranial Nerves:    The pupils are equal, round, and reactive to light. The fundi are normal and spontaneous venous pulsations are present. Visual fields are full to finger confrontation. Extraocular movements are intact. Trigeminal sensation is intact and the muscles of mastication are normal. The face is symmetric. The palate elevates in the midline. Hearing intact. Voice is normal. Shoulder shrug is normal. The tongue has normal motion without fasciculations.   Coordination:    Normal finger to nose and heel to shin. Normal rapid alternating movements.   Gait:    Heel-toe and tandem gait are normal.   Motor Observation:    No asymmetry, no atrophy, and no involuntary movements noted. Tone:    Normal muscle tone.    Posture:    Posture is normal. normal erect    Strength:    Strength is V/V in the upper and lower limbs.      Sensation: intact to LT     Reflex Exam:  DTR's:  Deep tendon reflexes in the upper and lower extremities are normal bilaterally.   Toes:    The toes are downgoing bilaterally.   Clonus:    Clonus is absent.   Assessment/Plan:  This is a lovely 35 year old male with a past medical history of migraines treated with Topamax, ocular migraines treated with verapamil, cluster headaches also treated with verapamil.  He has been doing extremely well with his migraines only 1 migraine a month on ajovy.  Today he comes back with cluster headaches.  He has had evaluation in the past including imaging of his brain and lumbar puncture which were all unremarkable.  Caren Beasley: [facility] [employer] [facility] Continue Ajovy Continue Verapamil Acutely: Rizatriptan and/or ubrelvy: Please take one tablet at the onset of your headache. If it does not improve the symptoms please take one  additional tablet. Do not take more then 2 tablets in 24hrs. Do not take use more then 2 to 3 times in a week.  Nausea and/or migraine: Ondansetron  NO CHARGE  PRIOR appointment:   Cluster headaches: He has had these in the past, last time was several years ago, very consistent with cluster headaches.  We discussed the difference between cluster headaches and migraines.  I do feel as though he has both and there is higher incidence and migraine auras with other kinds of headaches.  Cluster headaches are also more common in males.  And as the name implies they do happen in "clusters", they can happen at the same time every year, they may leave for a while and then come back.  We discussed the various treatments which include high-dose steroids initially and he would not like to do that.  We did discuss that the erenumab which is a CGRP medication has been approved for this condition, today we will inject him with 3 Ajovy which I feel may be just as effective, we will also increase his verapamil which is first-line treatment for cluster headaches, I will give him injectable Imitrex at the onset of his headaches.  I also gave him samples of Nurtec for 14 days to see if this can bridge until the other medications start to work.  Discussed oxygen however it is very difficult to get oxygen approved if you do not have a pulmonary disorder.  He will keep me updated on his progress and how the medications are working.   Cc: Dr. Elaina Hoops, MD   [facility] [address] [place], [place] [address]   Phone [phone] Fax [phone]   A total of 60 minutes was spent face-to-face with this patient. Over half this time was spent on counseling patient on the  1. Essential hypertension     diagnosis and different diagnostic and therapeutic options, counseling and coordination of care, risks ans benefits of management, compliance, or risk factor reduction and  education.

## 2021-05-28 NOTE — Telephone Encounter (Signed)
I have submitted a PA request for Ubrelvy '100mg'$  on CMM, Key: [id number]. Awaiting determination from [employer].

## 2021-05-28 NOTE — Patient Instructions (Signed)
Alexis [name]: Healthy [employer] [facility] Continue Ajovy Continue Verapamil Acutely: Rizatriptan and/or ubrelvy: Please take one tablet at the onset of your headache. If it does not improve the symptoms please take one additional tablet. Do not take more then 2 tablets in 24hrs. Do not take use more then 2 to 3 times in a week.  Nausea and/or migraine: Ondansetron

## 2021-05-28 NOTE — Addendum Note (Signed)
Addended by: Sarina Ill B on: 05/28/2021 01:44 PM   Modules accepted: Orders

## 2021-05-29 ENCOUNTER — Telehealth: Payer: Self-pay | Admitting: *Deleted

## 2021-05-29 ENCOUNTER — Encounter: Payer: Self-pay | Admitting: *Deleted

## 2021-05-29 NOTE — Telephone Encounter (Addendum)
Received PA approval for Pt Ubrelvy'100mg'$  tablet  PA Reference # [id number] Faxed the approval to pharmacy in chart  Effective Dates are 05/28/2021-11/27/2021

## 2021-06-01 NOTE — Anesthesia Preprocedure Evaluation (Addendum)
Anesthesia Evaluation  Patient identified by MRN, date of birth, ID band Patient awake    Reviewed: Allergy & Precautions, NPO status , Patient's Chart, lab work & pertinent test results  Airway Mallampati: III  TM Distance: >3 FB Neck ROM: Full    Dental no notable dental hx. (+) Teeth Intact, Dental Advisory Given   Pulmonary asthma ,    Pulmonary exam normal breath sounds clear to auscultation       Cardiovascular hypertension, Pt. on medications Normal cardiovascular exam Rhythm:Regular Rate:Normal     Neuro/Psych  Headaches,    GI/Hepatic GERD  ,  Endo/Other  Morbid obesity  Renal/GU negative Renal ROS     Musculoskeletal   Abdominal (+) + obese (BMI 56),   Peds  Hematology   Anesthesia Other Findings   Reproductive/Obstetrics                            Anesthesia Physical Anesthesia Plan  ASA: 3  Anesthesia Plan: MAC   Post-op Pain Management:    Induction:   PONV Risk Score and Plan: Treatment may vary due to age or medical condition  Airway Management Planned: Natural Airway  Additional Equipment: None  Intra-op Plan:   Post-operative Plan:   Informed Consent: I have reviewed the patients History and Physical, chart, labs and discussed the procedure including the risks, benefits and alternatives for the proposed anesthesia with the patient or authorized representative who has indicated his/her understanding and acceptance.     Dental advisory given  Plan Discussed with:   Anesthesia Plan Comments: (Nausea diarrhea for Colonoscopy MAC)       Anesthesia Quick Evaluation

## 2021-06-02 ENCOUNTER — Encounter (HOSPITAL_COMMUNITY): Payer: Self-pay | Admitting: Internal Medicine

## 2021-06-02 ENCOUNTER — Ambulatory Visit (HOSPITAL_COMMUNITY): Payer: 59 | Admitting: Anesthesiology

## 2021-06-02 ENCOUNTER — Encounter (HOSPITAL_COMMUNITY)
Admission: RE | Disposition: A | Discharge status: Home / Self Care | Payer: Self-pay | Source: Home / Self Care | Attending: Internal Medicine

## 2021-06-02 ENCOUNTER — Ambulatory Visit (HOSPITAL_COMMUNITY)
Admission: RE | Admit: 2021-06-02 | Discharge: 2021-06-02 | Disposition: A | Discharge status: Home / Self Care | Payer: 59 | Attending: Internal Medicine | Admitting: Internal Medicine

## 2021-06-02 ENCOUNTER — Other Ambulatory Visit: Payer: Self-pay

## 2021-06-02 DIAGNOSIS — Z87440 Personal history of urinary (tract) infections: Secondary | ICD-10-CM | POA: Insufficient documentation

## 2021-06-02 DIAGNOSIS — K580 Irritable bowel syndrome with diarrhea: Secondary | ICD-10-CM | POA: Insufficient documentation

## 2021-06-02 DIAGNOSIS — Z832 Family history of diseases of the blood and blood-forming organs and certain disorders involving the immune mechanism: Secondary | ICD-10-CM | POA: Diagnosis not present

## 2021-06-02 DIAGNOSIS — Z800 Family history of malignant neoplasm of digestive organs: Secondary | ICD-10-CM | POA: Diagnosis not present

## 2021-06-02 DIAGNOSIS — F419 Anxiety disorder, unspecified: Secondary | ICD-10-CM | POA: Insufficient documentation

## 2021-06-02 DIAGNOSIS — M1990 Unspecified osteoarthritis, unspecified site: Secondary | ICD-10-CM | POA: Insufficient documentation

## 2021-06-02 DIAGNOSIS — F32A Depression, unspecified: Secondary | ICD-10-CM | POA: Diagnosis not present

## 2021-06-02 DIAGNOSIS — R110 Nausea: Secondary | ICD-10-CM

## 2021-06-02 DIAGNOSIS — Z6841 Body Mass Index (BMI) 40.0 and over, adult: Secondary | ICD-10-CM | POA: Insufficient documentation

## 2021-06-02 DIAGNOSIS — R519 Headache, unspecified: Secondary | ICD-10-CM | POA: Diagnosis not present

## 2021-06-02 DIAGNOSIS — K317 Polyp of stomach and duodenum: Secondary | ICD-10-CM

## 2021-06-02 DIAGNOSIS — J45909 Unspecified asthma, uncomplicated: Secondary | ICD-10-CM | POA: Insufficient documentation

## 2021-06-02 DIAGNOSIS — Z801 Family history of malignant neoplasm of trachea, bronchus and lung: Secondary | ICD-10-CM | POA: Insufficient documentation

## 2021-06-02 DIAGNOSIS — Z91018 Allergy to other foods: Secondary | ICD-10-CM | POA: Insufficient documentation

## 2021-06-02 DIAGNOSIS — K8020 Calculus of gallbladder without cholecystitis without obstruction: Secondary | ICD-10-CM | POA: Diagnosis not present

## 2021-06-02 DIAGNOSIS — K760 Fatty (change of) liver, not elsewhere classified: Secondary | ICD-10-CM | POA: Diagnosis not present

## 2021-06-02 DIAGNOSIS — Z8261 Family history of arthritis: Secondary | ICD-10-CM | POA: Insufficient documentation

## 2021-06-02 DIAGNOSIS — Z880 Allergy status to penicillin: Secondary | ICD-10-CM | POA: Insufficient documentation

## 2021-06-02 DIAGNOSIS — D509 Iron deficiency anemia, unspecified: Secondary | ICD-10-CM | POA: Diagnosis not present

## 2021-06-02 DIAGNOSIS — Z881 Allergy status to other antibiotic agents status: Secondary | ICD-10-CM | POA: Insufficient documentation

## 2021-06-02 DIAGNOSIS — K6389 Other specified diseases of intestine: Secondary | ICD-10-CM | POA: Diagnosis not present

## 2021-06-02 DIAGNOSIS — R197 Diarrhea, unspecified: Secondary | ICD-10-CM

## 2021-06-02 DIAGNOSIS — Z8249 Family history of ischemic heart disease and other diseases of the circulatory system: Secondary | ICD-10-CM | POA: Diagnosis not present

## 2021-06-02 DIAGNOSIS — K219 Gastro-esophageal reflux disease without esophagitis: Secondary | ICD-10-CM | POA: Insufficient documentation

## 2021-06-02 DIAGNOSIS — Z79899 Other long term (current) drug therapy: Secondary | ICD-10-CM | POA: Diagnosis not present

## 2021-06-02 DIAGNOSIS — I10 Essential (primary) hypertension: Secondary | ICD-10-CM | POA: Diagnosis not present

## 2021-06-02 HISTORY — PX: COLONOSCOPY WITH PROPOFOL: SHX5780

## 2021-06-02 HISTORY — PX: BIOPSY: SHX5522

## 2021-06-02 HISTORY — PX: ESOPHAGOGASTRODUODENOSCOPY (EGD) WITH PROPOFOL: SHX5813

## 2021-06-02 SURGERY — COLONOSCOPY WITH PROPOFOL
Anesthesia: Monitor Anesthesia Care

## 2021-06-02 MED ORDER — PROPOFOL 1000 MG/100ML IV EMUL
INTRAVENOUS | Status: AC
  Filled 2021-06-02: qty 100

## 2021-06-02 MED ORDER — LACTATED RINGERS IV SOLN: INTRAVENOUS | Status: DC | PRN

## 2021-06-02 MED ORDER — PROPOFOL 10 MG/ML IV BOLUS
INTRAVENOUS | Status: DC | PRN
  Administered 2021-06-02: 100 mg via INTRAVENOUS

## 2021-06-02 MED ORDER — PROPOFOL 500 MG/50ML IV EMUL
INTRAVENOUS | Status: DC | PRN
  Administered 2021-06-02: 100 ug/kg/min via INTRAVENOUS
  Administered 2021-06-02: 150 ug/kg/min via INTRAVENOUS

## 2021-06-02 MED ORDER — MIDAZOLAM HCL 2 MG/2ML IJ SOLN
INTRAMUSCULAR | Status: AC
  Filled 2021-06-02: qty 2

## 2021-06-02 MED ORDER — LACTATED RINGERS IV SOLN: Freq: Once | INTRAVENOUS | Status: AC

## 2021-06-02 MED ORDER — SODIUM CHLORIDE 0.9 % IV SOLN: INTRAVENOUS | Status: DC

## 2021-06-02 MED ORDER — MIDAZOLAM HCL 5 MG/5ML IJ SOLN
INTRAMUSCULAR | Status: DC | PRN
  Administered 2021-06-02: 2 mg via INTRAVENOUS

## 2021-06-02 SURGICAL SUPPLY — 25 items

## 2021-06-02 NOTE — Interval H&P Note (Signed)
History and Physical Interval Note:  06/02/2021 8:50 AM  Alexis [name]  has presented today for surgery, with the diagnosis of IDA/nausea/GERD/diarrhea.  The various methods of treatment have been discussed with the patient and family. After consideration of risks, benefits and other options for treatment, the patient has consented to  Procedure(s): COLONOSCOPY WITH PROPOFOL (N/A) ESOPHAGOGASTRODUODENOSCOPY (EGD) WITH PROPOFOL (N/A) as a surgical intervention.  The patient's history has been reviewed, patient examined, no change in status, stable for surgery.  I have reviewed the patient's chart and labs.  Questions were answered to the patient's satisfaction.     Silvano Rusk

## 2021-06-02 NOTE — Transfer of Care (Signed)
Immediate Anesthesia Transfer of Care Note  Patient: Alexis [name]  Procedure(s) Performed: COLONOSCOPY WITH PROPOFOL ESOPHAGOGASTRODUODENOSCOPY (EGD) WITH PROPOFOL BIOPSY  Patient Location: PACU and Endoscopy Unit  Anesthesia Type:MAC  Level of Consciousness: awake, alert  and oriented  Airway & Oxygen Therapy: Patient Spontanous Breathing  Post-op Assessment: Report given to RN and Post -op Vital signs reviewed and stable  Post vital signs: Reviewed and stable  Last Vitals:  Vitals Value Taken Time  BP 143/81 06/02/21 0932  Temp 36.8 C 06/02/21 0932  Pulse 104 06/02/21 0933  Resp 22 06/02/21 0933  SpO2 94 % 06/02/21 0933  Vitals shown include unvalidated device data.  Last Pain:  Vitals:   06/02/21 0932  TempSrc: Axillary  PainSc:          Complications: No notable events documented.

## 2021-06-02 NOTE — Discharge Instructions (Addendum)
I did not see any significant abnormalities.  I did see some stomach polyps that are most likely releated to chronic omeprazole use. These are benign and innocent and ususally do not need follow-up. I took biopsies to check.  I took colon and small intestine biopsies as well and will be in touch with results and plans.  One thing I want you to think about is that processed foods can be contributing to your health issues and gastrointestinal problems. I want to discuss more bit ask that you think about your consumption of sodas, juices and other drinlks with added sugars and calories, and also consumption of processed foods like chips, pasta, pizza, breads, cakes, cookies, etc. It has been shown that these processed and high carbohydrate foods lead to gastrointestinal and liver problems as well as other health problems. Limiting consumption of these, if you eat or drink them, is in your best interest.  I appreciate the opportunity to care for you. Gatha Mayer, MD, FACG      YOU HAD AN ENDOSCOPIC PROCEDURE TODAY: Refer to the procedure report and other information in the discharge instructions given to you for any specific questions about what was found during the examination. If this information does not answer your questions, please call [place] office at [phone] to clarify.   YOU SHOULD EXPECT: Some feelings of bloating in the abdomen. Passage of more gas than usual. Walking can help get rid of the air that was put into your GI tract during the procedure and reduce the bloating. If you had a lower endoscopy (such as a colonoscopy or flexible sigmoidoscopy) you may notice spotting of blood in your stool or on the toilet paper. Some abdominal soreness may be present for a day or two, also.  DIET: Your first meal following the procedure should be a light meal and then it is ok to progress to your normal diet. A half-sandwich or bowl of soup is an example of a good first meal. Heavy or fried  foods are harder to digest and may make you feel nauseous or bloated. Drink plenty of fluids but you should avoid alcoholic beverages for 24 hours. If you had a esophageal dilation, please see attached instructions for diet.    ACTIVITY: Your care partner should take you home directly after the procedure. You should plan to take it easy, moving slowly for the rest of the day. You can resume normal activity the day after the procedure however YOU SHOULD NOT DRIVE, use power tools, machinery or perform tasks that involve climbing or major physical exertion for 24 hours (because of the sedation medicines used during the test).   SYMPTOMS TO REPORT IMMEDIATELY: A gastroenterologist can be reached at any hour. Please call [phone]  for any of the following symptoms:  Following lower endoscopy (colonoscopy, flexible sigmoidoscopy) Excessive amounts of blood in the stool  Significant tenderness, worsening of abdominal pains  Swelling of the abdomen that is new, acute  Fever of 100 or higher  Following upper endoscopy (EGD, EUS, ERCP, esophageal dilation) Vomiting of blood or coffee ground material  New, significant abdominal pain  New, significant chest pain or pain under the shoulder blades  Painful or persistently difficult swallowing  New shortness of breath  Black, tarry-looking or red, bloody stools  FOLLOW UP:  If any biopsies were taken you will be contacted by phone or by letter within the next 1-3 weeks. Call [phone]  if you have not heard about the biopsies in 3  weeks.  Please also call with any specific questions about appointments or follow up tests.

## 2021-06-02 NOTE — Op Note (Signed)
[facility] Patient Name: Alexis [name] Procedure Date: 06/02/2021 MRN: [id number] Attending MD: Gatha Mayer , MD Date of Birth: 1986-04-28 CSN: [id number] Age: 35 Admit Type: Outpatient Procedure:                Colonoscopy Indications:              Iron deficiency anemia, Diarrhea Providers:                Gatha Mayer, MD, Doristine Johns, RN, Carlyn Reichert, RN, Cherylynn Ridges, Technician Referring MD:              Medicines:                Propofol per Anesthesia, Monitored Anesthesia Care Complications:            No immediate complications. Estimated Blood Loss:     Estimated blood loss was minimal. Procedure:                Pre-Anesthesia Assessment:                           - Prior to the procedure, a History and Physical                            was performed, and patient medications and                            allergies were reviewed. The patient's tolerance of                            previous anesthesia was also reviewed. The risks                            and benefits of the procedure and the sedation                            options and risks were discussed with the patient.                            All questions were answered, and informed consent                            was obtained. Prior Anticoagulants: The patient has                            taken no previous anticoagulant or antiplatelet                            agents. ASA Grade Assessment: III - A patient with                            severe systemic disease. After reviewing the risks  and benefits, the patient was deemed in                            satisfactory condition to undergo the procedure.                           After obtaining informed consent, the colonoscope                            was passed under direct vision. Throughout the                            procedure, the patient's blood pressure, pulse,  and                            oxygen saturations were monitored continuously. The                            [id number] Olympus colonoscope was                            introduced through the anus and advanced to the the                            terminal ileum, with identification of the                            appendiceal orifice and IC valve. The colonoscopy                            was performed without difficulty. The patient                            tolerated the procedure well. The quality of the                            bowel preparation was good. The terminal ileum,                            ileocecal valve, appendiceal orifice, and rectum                            were photographed. Scope In: [id number] AM Scope Out: 9:27:00 AM Scope Withdrawal Time: 0 hours 9 minutes 0 seconds  Total Procedure Duration: 0 hours 10 minutes 43 seconds  Findings:      The perianal and digital rectal examinations were normal. Pertinent       negatives include normal prostate (size, shape, and consistency).      An area of mucosa in the terminal ileum was nodular. Biopsies were taken       with a cold forceps for histology. Verification of patient       identification for the specimen was done. Estimated blood loss was       minimal.      The entire examined colon appeared normal on direct and  retroflexion       views.      Biopsies for histology were taken with a cold forceps from the ascending       colon, transverse colon, descending colon, sigmoid colon and rectum for       evaluation of microscopic colitis. Estimated blood loss was minimal. Impression:               - Nodular ileal mucosa. Biopsied.                           - The entire examined colon is normal on direct and                            retroflexion views.                           - Biopsies were taken with a cold forceps from the                            ascending colon, transverse colon, descending                             colon, sigmoid colon and rectum for evaluation of                            microscopic colitis.                           Paternal great grandmother and paternal uncle had                            CRCA at advanced ages Moderate Sedation:      Not Applicable - Patient had care per Anesthesia. Recommendation:           - Patient has a contact number available for                            emergencies. The signs and symptoms of potential                            delayed complications were discussed with the                            patient. Return to normal activities tomorrow.                            Written discharge instructions were provided to the                            patient.                           - Limit processed foods especially carbohydrates.                           - Await pathology results. will arrange f/u after  that Procedure Code(s):        --- Professional ---                           [phone], Colonoscopy, flexible; with biopsy, single                            or multiple Diagnosis Code(s):        --- Professional ---                           K63.89, Other specified diseases of intestine                           D50.9, Iron deficiency anemia, unspecified                           R19.7, Diarrhea, unspecified CPT copyright 2019 [employer]. All rights reserved. The codes documented in this report are preliminary and upon coder review may  be revised to meet current compliance requirements. Gatha Mayer, MD 06/02/2021 9:47:42 AM This report has been signed electronically. Number of Addenda: 0

## 2021-06-02 NOTE — Op Note (Signed)
[facility] Patient Name: Alexis [name] Procedure Date: 06/02/2021 MRN: [id number] Attending MD: Gatha Mayer , MD Date of Birth: 08-05-1986 CSN: [id number] Age: 35 Admit Type: Outpatient Procedure:                Upper GI endoscopy Indications:              Iron deficiency anemia Providers:                Gatha Mayer, MD, Doristine Johns, RN, Carlyn Reichert, RN, Cherylynn Ridges, Technician Referring MD:              Medicines:                Propofol per Anesthesia, Monitored Anesthesia Care Complications:            No immediate complications. Estimated Blood Loss:     Estimated blood loss was minimal. Procedure:                Pre-Anesthesia Assessment:                           - Prior to the procedure, a History and Physical                            was performed, and patient medications and                            allergies were reviewed. The patient's tolerance of                            previous anesthesia was also reviewed. The risks                            and benefits of the procedure and the sedation                            options and risks were discussed with the patient.                            All questions were answered, and informed consent                            was obtained. Prior Anticoagulants: The patient has                            taken no previous anticoagulant or antiplatelet                            agents. ASA Grade Assessment: III - A patient with                            severe systemic disease. After reviewing the risks  and benefits, the patient was deemed in                            satisfactory condition to undergo the procedure.                           After obtaining informed consent, the endoscope was                            passed under direct vision. Throughout the                            procedure, the patient's blood pressure, pulse, and                             oxygen saturations were monitored continuously. The                            [id number] Olympus endoscope was introduced                            through the mouth, and advanced to the second part                            of duodenum. The upper GI endoscopy was                            accomplished without difficulty. The patient                            tolerated the procedure well. Scope In: Scope Out: Findings:      The examined esophagus was normal.      Multiple diminutive sessile polyps were found in the gastric fundus and       in the gastric body. Biopsies were taken with a cold forceps for       histology. Verification of patient identification for the specimen was       done. Estimated blood loss was minimal.      The examined duodenum was normal.      The cardia and gastric fundus were normal on retroflexion. Impression:               - Normal esophagus.                           - Multiple gastric polyps. Biopsied.                           - Normal examined duodenum. Moderate Sedation:      Not Applicable - Patient had care per Anesthesia. Recommendation:           - Patient has a contact number available for                            emergencies. The signs and symptoms of potential  delayed complications were discussed with the                            patient. Return to normal activities tomorrow.                            Written discharge instructions were provided to the                            patient.                           - Reduce processed foods and carbs.                           - Continue present medications.                           - Await pathology results.                           - See the other procedure note for documentation of                            additional recommendations. Procedure Code(s):        --- Professional ---                           [phone],  Esophagogastroduodenoscopy, flexible,                            transoral; with biopsy, single or multiple Diagnosis Code(s):        --- Professional ---                           K31.7, Polyp of stomach and duodenum                           D50.9, Iron deficiency anemia, unspecified CPT copyright 2019 American [employer] Association. All rights reserved. The codes documented in this report are preliminary and upon coder review may  be revised to meet current compliance requirements. Gatha Mayer, MD 06/02/2021 9:43:38 AM This report has been signed electronically. Number of Addenda: 0

## 2021-06-02 NOTE — Anesthesia Postprocedure Evaluation (Signed)
Anesthesia Post Note  Patient: Alexis [name]  Procedure(s) Performed: COLONOSCOPY WITH PROPOFOL ESOPHAGOGASTRODUODENOSCOPY (EGD) WITH PROPOFOL BIOPSY     Patient location during evaluation: Endoscopy Anesthesia Type: MAC Level of consciousness: awake and alert Pain management: pain level controlled Vital Signs Assessment: post-procedure vital signs reviewed and stable Respiratory status: spontaneous breathing, nonlabored ventilation, respiratory function stable and patient connected to nasal cannula oxygen Cardiovascular status: blood pressure returned to baseline and stable Postop Assessment: no apparent nausea or vomiting Anesthetic complications: no   No notable events documented.  Last Vitals:  Vitals:   06/02/21 0940 06/02/21 0950  BP: 126/70 116/84  Pulse: (!) 102 89  Resp: 20 20  Temp:    SpO2: 94% 97%    Last Pain:  Vitals:   06/02/21 0940  TempSrc:   PainSc: 0-No pain                 Barnet Glasgow

## 2021-06-03 ENCOUNTER — Encounter (HOSPITAL_COMMUNITY): Payer: Self-pay | Admitting: Internal Medicine

## 2021-06-03 LAB — SURGICAL PATHOLOGY

## 2021-06-17 ENCOUNTER — Other Ambulatory Visit: Payer: Self-pay

## 2021-06-17 MED ORDER — IRON 325 (65 FE) MG PO TABS: 325.0000 mg | ORAL_TABLET | Freq: Every day | ORAL | 0 refills | Status: AC

## 2021-07-04 ENCOUNTER — Ambulatory Visit (INDEPENDENT_AMBULATORY_CARE_PROVIDER_SITE_OTHER): Payer: 59

## 2021-07-04 ENCOUNTER — Ambulatory Visit
Admission: RE | Admit: 2021-07-04 | Discharge: 2021-07-04 | Disposition: A | Discharge status: Home / Self Care | Payer: 59 | Source: Ambulatory Visit | Attending: Emergency Medicine | Admitting: Emergency Medicine

## 2021-07-04 ENCOUNTER — Other Ambulatory Visit: Payer: Self-pay

## 2021-07-04 VITALS — BP 149/87 | HR 133 | Temp 102.4°F

## 2021-07-04 DIAGNOSIS — R059 Cough, unspecified: Secondary | ICD-10-CM

## 2021-07-04 DIAGNOSIS — J029 Acute pharyngitis, unspecified: Secondary | ICD-10-CM | POA: Diagnosis not present

## 2021-07-04 DIAGNOSIS — R509 Fever, unspecified: Secondary | ICD-10-CM

## 2021-07-04 DIAGNOSIS — B349 Viral infection, unspecified: Secondary | ICD-10-CM

## 2021-07-04 NOTE — Discharge Instructions (Addendum)
Your COVID and Flu tests are pending.  You should self quarantine until the test results are back.    Take Tylenol or ibuprofen as needed for fever or discomfort.  Rest and keep yourself hydrated.    Follow-up with your primary care provider if your symptoms are not improving.     

## 2021-07-04 NOTE — ED Provider Notes (Signed)
Alexis [name]    CSN: [id number] Arrival date & time: 07/04/21  1837      History   Chief Complaint Chief Complaint  Patient presents with   Cough   Sore Throat   Fever     HPI Alexis [name] is a 35 y.o. male.  Patient presents with 1 week history of fever, chills, fatigue, body aches, sore throat, cough productive of green sputum.  He also reports shortness of breath at night.  He denies rash, vomiting, diarrhea, or other symptoms.  OTC treatment attempted at home.  His medical history includes hypertension, asthma, morbid obesity.  The history is provided by the patient and medical records.   Past Medical History:  Diagnosis Date   Acid reflux    ADD 09/13/2007   ALLERGIC RHINITIS 06/10/2009   ASTHMA, UNSPECIFIED, UNSPECIFIED STATUS 07/19/2008   DEPRESSIVE DISORDER 58/06/9832   DYSMETABOLIC SYNDROME 82/50/5397   GERD 09/13/2007   Headache    HYPERLIPIDEMIA 10/10/2009   HYPERTENSION 09/13/2007   IBS 03/20/2009   Morbid obesity ([place]) 07/19/2008   Skin cancer     Patient Active Problem List   Diagnosis Date Noted   Iron deficiency anemia    Gastric polyps    Episodic cluster headache, not intractable 10/17/2019   Hyperglycemia 11/16/2018   Need for HPV vaccination 11/16/2018   Diarrhea 12/20/2017   High risk homosexual behavior 05/19/2017   Myopia of both eyes 12/30/2016   Primary osteoarthritis involving multiple joints 07/06/2014   Hyperlipidemia LDL goal <130 10/10/2009   Morbid obesity ([place]) 07/19/2008   Mild persistent asthma 07/19/2008   GAD (generalized anxiety disorder) 09/13/2007   Essential hypertension 09/13/2007   GERD (gastroesophageal reflux disease) 09/13/2007    Past Surgical History:  Procedure Laterality Date   ANKLE SURGERY  2007   APPENDECTOMY  2005   BIOPSY  06/02/2021   Procedure: BIOPSY;  Surgeon: Gatha Mayer, MD;  Location: [facility];  Service: Endoscopy;;  EGD and COLON   COLONOSCOPY WITH PROPOFOL N/A 06/02/2021    Procedure: COLONOSCOPY WITH PROPOFOL;  Surgeon: Gatha Mayer, MD;  Location: [facility] ENDOSCOPY;  Service: Endoscopy;  Laterality: N/A;   ESOPHAGOGASTRODUODENOSCOPY (EGD) WITH PROPOFOL N/A 06/02/2021   Procedure: ESOPHAGOGASTRODUODENOSCOPY (EGD) WITH PROPOFOL;  Surgeon: Gatha Mayer, MD;  Location: [facility] ENDOSCOPY;  Service: Endoscopy;  Laterality: N/A;   IR GENERIC HISTORICAL  07/23/2016   IR FLUORO GUIDED NEEDLE PLC ASPIRATION/INJECTION LOC 07/23/2016 Arne Cleveland, MD [facility]INTER[facility]   SKIN CANCER EXCISION     teenager   TEE WITHOUT CARDIOVERSION N/A 04/15/2016   Procedure: TRANSESOPHAGEAL ECHOCARDIOGRAM (TEE);  Surgeon: Sanda Klein, MD;  Location: [facility] ENDOSCOPY;  Service: Cardiovascular;  Laterality: N/A;       Home Medications    Prior to Admission medications   Medication Sig Start Date End Date Taking? Authorizing Provider  ALPRAZolam Duanne Moron) 1 MG tablet Take 1 tablet (1 mg total) by mouth 2 (two) times daily as needed for anxiety. 02/02/18   Janith Lima, MD  BL CALCIUM-MAGNESIUM-ZINC PO Take by mouth.    [provider]  buPROPion (WELLBUTRIN) 75 MG tablet TAKE 1 TABLET BY MOUTH EVERY DAY 06/08/19   Janith Lima, MD  cetirizine (ZYRTEC) 10 MG tablet 1 tablet    [provider]  EPINEPHrine 0.3 mg/0.3 mL IJ SOAJ injection Inject into the muscle once.    [provider]  Ferrous Sulfate (IRON) 325 (65 Fe) MG TABS Take 1 tablet (325 mg total) by mouth  daily. 06/17/21   Gatha Mayer, MD  Fremanezumab-vfrm (AJOVY) 225 MG/1.5ML SOAJ Inject 225 mg into the skin every 30 (thirty) days. 05/28/21   Melvenia Beam, MD  furosemide (LASIX) 20 MG tablet Take 1 tablet (20 mg total) by mouth daily. Patient taking differently: Take 20 mg by mouth daily as needed. 06/02/18   Janith Lima, MD  irbesartan-hydrochlorothiazide (AVALIDE) 300-12.5 MG tablet TAKE 1 TABLET BY MOUTH EVERY DAY NEEDS APPT FOR REFILLS 11/24/18   Janith Lima, MD  omeprazole (PRILOSEC) 40 MG  capsule TAKE 1 CAPSULE BY MOUTH TWICE A DAY 04/20/18   Janith Lima, MD  ondansetron (ZOFRAN-ODT) 4 MG disintegrating tablet Take 1-2 tablets (4-8 mg total) by mouth every 8 (eight) hours as needed. 05/28/21   Melvenia Beam, MD  potassium chloride (KLOR-CON) 8 MEQ tablet Take 8 mEq by mouth daily.    [provider]  PROAIR HFA 108 (90 Base) MCG/ACT inhaler INHALE 1-2 PUFFS EVERY 4-6 HOURS AS NEEDED FOR PERSISTENT COUGH OR INCREASED WORK OF BREATHING 09/08/16   [provider]  rizatriptan (MAXALT-MLT) 10 MG disintegrating tablet Take 1 tablet (10 mg total) by mouth as needed for migraine. May repeat in 2 hours if needed 05/28/21   Melvenia Beam, MD  Ubrogepant (UBRELVY) 100 MG TABS Take 100 mg by mouth every 2 (two) hours as needed. Maximum 200mg  a day. 05/28/21   Melvenia Beam, MD  verapamil (VERELAN PM) 240 MG 24 hr capsule Take 1 capsule (240 mg total) by mouth daily. 05/28/21   Melvenia Beam, MD    Family History Family History  Problem Relation Age of Onset   Asthma Mother    Clotting disorder Mother        Factor 5   Rheum arthritis Mother    Skin cancer Mother    Heart attack Mother    Migraines Mother    Hypertension Father    Diabetes Father        type 2    Clotting disorder Maternal Uncle        Factor 5   Emphysema Maternal Grandmother        smoker   Asthma Maternal Grandmother    Lung cancer Maternal Grandmother    Colon polyps Paternal Grandmother    Asthma Paternal Grandmother    Diabetes Paternal Grandmother        type 2   Allergies Other        whole family-both sides of family   Heart disease Other        MGM deceased with MI; both sides of grandparents    Social History Social History   Tobacco Use   Smoking status: Never   Smokeless tobacco: Never  Vaping Use   Vaping Use: Never used  Substance Use Topics   Alcohol use: No    Comment: socially   Drug use: No     Allergies   Citrus, Amoxicillin-pot clavulanate,  Sulfamethoxazole-trimethoprim, and Vancomycin   Review of Systems Review of Systems  Constitutional:  Positive for fatigue. Negative for chills and fever.  HENT:  Positive for sore throat. Negative for ear pain.   Eyes:  Negative for pain and visual disturbance.  Respiratory:  Positive for cough and shortness of breath.   Cardiovascular:  Negative for chest pain and palpitations.  Gastrointestinal:  Negative for abdominal pain, diarrhea and vomiting.  Skin:  Negative for color change and rash.  All other systems reviewed and are  negative.   Physical Exam Triage Vital Signs ED Triage Vitals  Enc Vitals Group     BP      Pulse      Resp      Temp      Temp src      SpO2      Weight      Height      Head Circumference      Peak Flow      Pain Score      Pain Loc      Pain Edu?      Excl. in [place]?    No data found.  Updated Vital Signs BP (!) 149/87   Pulse (!) 133   Temp (!) 102.4 F (39.1 C) (Oral)   SpO2 95%   Visual Acuity Right Eye Distance:   Left Eye Distance:   Bilateral Distance:    Right Eye Near:   Left Eye Near:    Bilateral Near:     Physical Exam Vitals and nursing note reviewed.  Constitutional:      General: He is not in acute distress.    Appearance: He is well-developed. He is obese.  HENT:     Head: Normocephalic and atraumatic.     Right Ear: Tympanic membrane normal.     Left Ear: Tympanic membrane normal.     Nose: Nose normal.     Mouth/Throat:     Mouth: Mucous membranes are moist.     Pharynx: Oropharynx is clear.  Eyes:     Conjunctiva/sclera: Conjunctivae normal.  Cardiovascular:     Rate and Rhythm: Normal rate and regular rhythm.     Heart sounds: Normal heart sounds.  Pulmonary:     Effort: Pulmonary effort is normal. No respiratory distress.     Breath sounds: Normal breath sounds.  Abdominal:     Palpations: Abdomen is soft.     Tenderness: There is no abdominal tenderness.  Musculoskeletal:     Cervical back: Neck  supple.  Skin:    General: Skin is warm and dry.  Neurological:     General: No focal deficit present.     Mental Status: He is alert and oriented to person, place, and time.     Gait: Gait normal.  Psychiatric:        Mood and Affect: Mood normal.        Behavior: Behavior normal.     UC Treatments / Results  Labs (all labs ordered are listed, but only abnormal results are displayed) Labs Reviewed  COVID-19, FLU A+B NAA    EKG   Radiology DG Chest 2 View  Result Date: 07/04/2021 CLINICAL DATA:  Productive cough. Fever and sore throat. Symptoms for 1 week. EXAM: CHEST - 2 VIEW COMPARISON:  05/06/2018 FINDINGS: The cardiomediastinal contours are normal. Mild bronchial thickening. Pulmonary vasculature is normal. No consolidation, pleural effusion, or pneumothorax. No acute osseous abnormalities are seen. IMPRESSION: Mild bronchial thickening without pneumonia. Electronically Signed   By: Keith Rake M.D.   On: 07/04/2021 19:35    Procedures Procedures (including critical care time)  Medications Ordered in UC Medications - No data to display  Initial Impression / Assessment and Plan / UC Course  I have reviewed the triage vital signs and the nursing notes.  Pertinent labs & imaging results that were available during my care of the patient were reviewed by me and considered in my medical decision making (see chart for details).   Fever,  Viral illness.  CXR shows no pneumonia.  COVID and Flu pending.  Instructed patient to self quarantine per CDC guidelines.  Discussed symptomatic treatment including Tylenol or ibuprofen, rest, hydration.  Instructed patient to follow up with PCP if symptoms are not improving.  Patient agrees to plan of care.    Final Clinical Impressions(s) / UC Diagnoses   Final diagnoses:  Fever, unspecified  Viral illness     Discharge Instructions      Your COVID and Flu tests are pending.  You should self quarantine until the test results  are back.    Take Tylenol or ibuprofen as needed for fever or discomfort.  Rest and keep yourself hydrated.    Follow-up with your primary care provider if your symptoms are not improving.         ED Prescriptions   None    PDMP not reviewed this encounter.   Sharion Balloon, NP 07/04/21 [phone]

## 2021-07-04 NOTE — ED Triage Notes (Signed)
Pt here with cough, fever, and sore throat x 1 week.

## 2021-07-06 LAB — COVID-19, FLU A+B NAA
Influenza A, NAA: NOT DETECTED
Influenza B, NAA: NOT DETECTED
SARS-CoV-2, NAA: NOT DETECTED

## 2021-09-12 ENCOUNTER — Other Ambulatory Visit (INDEPENDENT_AMBULATORY_CARE_PROVIDER_SITE_OTHER): Payer: 59

## 2021-09-12 ENCOUNTER — Ambulatory Visit (INDEPENDENT_AMBULATORY_CARE_PROVIDER_SITE_OTHER): Payer: 59 | Admitting: Internal Medicine

## 2021-09-12 ENCOUNTER — Encounter: Payer: Self-pay | Admitting: Internal Medicine

## 2021-09-12 ENCOUNTER — Other Ambulatory Visit: Payer: Self-pay | Admitting: Internal Medicine

## 2021-09-12 VITALS — BP 126/76 | HR 109 | Ht 78.0 in | Wt >= 6400 oz

## 2021-09-12 DIAGNOSIS — D509 Iron deficiency anemia, unspecified: Secondary | ICD-10-CM

## 2021-09-12 DIAGNOSIS — R197 Diarrhea, unspecified: Secondary | ICD-10-CM

## 2021-09-12 DIAGNOSIS — R040 Epistaxis: Secondary | ICD-10-CM

## 2021-09-12 LAB — CBC WITH DIFFERENTIAL/PLATELET
Basophils Absolute: 0.1 10*3/uL (ref 0.0–0.1)
Basophils Relative: 0.8 % (ref 0.0–3.0)
Eosinophils Absolute: 0.3 10*3/uL (ref 0.0–0.7)
Eosinophils Relative: 2.9 % (ref 0.0–5.0)
HCT: 41.1 % (ref 39.0–52.0)
Hemoglobin: 13.3 g/dL (ref 13.0–17.0)
Lymphocytes Relative: 21.2 % (ref 12.0–46.0)
Lymphs Abs: 2.2 10*3/uL (ref 0.7–4.0)
MCHC: 32.4 g/dL (ref 30.0–36.0)
MCV: 73.3 fl — ABNORMAL LOW (ref 78.0–100.0)
Monocytes Absolute: 1 10*3/uL (ref 0.1–1.0)
Monocytes Relative: 9.3 % (ref 3.0–12.0)
Neutro Abs: 6.9 10*3/uL (ref 1.4–7.7)
Neutrophils Relative %: 65.8 % (ref 43.0–77.0)
Platelets: 254 10*3/uL (ref 150.0–400.0)
RBC: 5.6 Mil/uL (ref 4.22–5.81)
RDW: 16.3 % — ABNORMAL HIGH (ref 11.5–15.5)
WBC: 10.5 10*3/uL (ref 4.0–10.5)

## 2021-09-12 LAB — FERRITIN: Ferritin: 33.9 ng/mL (ref 22.0–322.0)

## 2021-09-12 NOTE — Progress Notes (Signed)
Alexis [name] 35 y.o. [birth year] [id number]  Assessment & Plan:   Encounter Diagnoses  Name Primary?   Iron deficiency anemia, unspecified iron deficiency anemia type Yes   Diarrhea, unspecified type    Epistaxis    Diarrhea is improved.  Maybe this was irritable bowel syndrome. Reassess iron deficiency. Chronic recurrent epistaxis could play a role I suppose.  Orders Placed This Encounter  Procedures   CBC with Differential/Platelet   Ferritin     Further plans pending these results.  I think it is reasonable to hold off on capsule endoscopy at this time given the resolution of diarrhea.  Labs have returned at the time of dictation.  His ferritin is improved from a level of 25 in July he remains microcytic and still has a normal hemoglobin.  He should continue his iron supplementation and we will repeat these labs in 3 months.  Lab Results  Component Value Date   WBC 10.5 09/12/2021   HGB 13.3 09/12/2021   HCT 41.1 09/12/2021   MCV 73.3 (L) 09/12/2021   PLT 254.0 09/12/2021   Lab Results  Component Value Date   FERRITIN 33.9 09/12/2021     CC: Marda Stalker, PA-C    Subjective:   Chief Complaint: Follow-up of diarrhea and iron deficiency anemia  HPI [place] is a 35 year old white man previously seen this summer by Ciearra Rufo Best, he had an iron deficiency anemia that was treated with iron and was improving.  He was also having some diarrhea problems.  He had an EGD and a colonoscopy performed by me in August.  He had gastric fundic gland polyps and normal terminal ileum biopsies and negative random colon biopsies.  In the interim his diarrhea has resolved.  We were going to do a capsule endoscopy because of the diarrhea and iron deficiency anemia question Crohn's disease but he wanted to defer that and presents today saying he does not wish to pursue that.  He does eat red meat but does not eat significant amounts of green leafy vegetables with respect  to iron ingestion.  He does have a history of chronic recurrent epistaxis he has had recurrent sinusitis problems.  At one point he had complications of a root canal with a sinus abscess and hospitalization and a lot of problems.  Since that time he has had the recurrent epistaxis and has had sinus surgery recommended but has not pursued that.  He may at some point.  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  Does not have an ear nose and throat specialist at this time. Allergies  Allergen Reactions   Citrus Anaphylaxis and Other (See Comments)    Lemon   Amoxicillin-Pot Clavulanate Nausea Only   Sulfamethoxazole-Trimethoprim Other (See Comments)    Does not remember, long ago   Vancomycin Other (See Comments)    Mild red mans syndrome, infuse more slowly.   Current Meds  Medication Sig   ALPRAZolam (XANAX) 1 MG tablet Take 1 tablet (1 mg total) by mouth 2 (two) times daily as needed for anxiety.   BL CALCIUM-MAGNESIUM-ZINC PO Take by mouth.   buPROPion (WELLBUTRIN XL) 150 MG 24 hr tablet Take 150 mg by mouth daily.   cetirizine (ZYRTEC) 10 MG tablet 1 tablet   EPINEPHrine 0.3 mg/0.3 mL IJ SOAJ injection Inject into the muscle once.   Ferrous Sulfate (IRON) 325 (65 Fe) MG TABS Take 1 tablet (325 mg total) by mouth daily.   Fremanezumab-vfrm (AJOVY)  225 MG/1.5ML SOAJ Inject 225 mg into the skin every 30 (thirty) days.   furosemide (LASIX) 20 MG tablet Take 1 tablet (20 mg total) by mouth daily. (Patient taking differently: Take 20 mg by mouth daily as needed.)   irbesartan-hydrochlorothiazide (AVALIDE) 300-12.5 MG tablet TAKE 1 TABLET BY MOUTH EVERY DAY NEEDS APPT FOR REFILLS   omeprazole (PRILOSEC) 40 MG capsule TAKE 1 CAPSULE BY MOUTH TWICE A DAY   ondansetron (ZOFRAN-ODT) 4 MG disintegrating tablet Take 1-2 tablets (4-8 mg total) by mouth every 8 (eight) hours as needed.   potassium chloride (KLOR-CON) 8 MEQ tablet Take 8 mEq by mouth daily.   PROAIR HFA 108 (90 Base)  MCG/ACT inhaler INHALE 1-2 PUFFS EVERY 4-6 HOURS AS NEEDED FOR PERSISTENT COUGH OR INCREASED WORK OF BREATHING   rizatriptan (MAXALT-MLT) 10 MG disintegrating tablet Take 1 tablet (10 mg total) by mouth as needed for migraine. May repeat in 2 hours if needed   Ubrogepant (UBRELVY) 100 MG TABS Take 100 mg by mouth every 2 (two) hours as needed. Maximum 200mg  a day.   verapamil (VERELAN PM) 240 MG 24 hr capsule Take 1 capsule (240 mg total) by mouth daily.   Past Medical History:  Diagnosis Date   Acid reflux    ADD 09/13/2007   ALLERGIC RHINITIS 06/10/2009   ASTHMA, UNSPECIFIED, UNSPECIFIED STATUS 07/19/2008   Bacterial sinusitis    C. difficile diarrhea    Dental abscess    caused sinusitis   DEPRESSIVE DISORDER 62/69/4854   DYSMETABOLIC SYNDROME 62/70/3500   GERD 09/13/2007   Headache    HYPERLIPIDEMIA 10/10/2009   HYPERTENSION 09/13/2007   IBS 03/20/2009   Morbid obesity ([place]) 07/19/2008   Skin cancer    Past Surgical History:  Procedure Laterality Date   ANKLE SURGERY  2007   APPENDECTOMY  2005   BIOPSY  06/02/2021   Procedure: BIOPSY;  Surgeon: Gatha Mayer, MD;  Location: [facility];  Service: Endoscopy;;  EGD and COLON   COLONOSCOPY WITH PROPOFOL N/A 06/02/2021   Procedure: COLONOSCOPY WITH PROPOFOL;  Surgeon: Gatha Mayer, MD;  Location: [facility];  Service: Endoscopy;  Laterality: N/A;   ESOPHAGOGASTRODUODENOSCOPY (EGD) WITH PROPOFOL N/A 06/02/2021   Procedure: ESOPHAGOGASTRODUODENOSCOPY (EGD) WITH PROPOFOL;  Surgeon: Gatha Mayer, MD;  Location: [facility];  Service: Endoscopy;  Laterality: N/A;   IR GENERIC HISTORICAL  07/23/2016   IR FLUORO GUIDED NEEDLE PLC ASPIRATION/INJECTION LOC 07/23/2016 Arne Cleveland, MD [facility]   SKIN CANCER EXCISION     teenager   TEE WITHOUT CARDIOVERSION N/A 04/15/2016   Procedure: TRANSESOPHAGEAL ECHOCARDIOGRAM (TEE);  Surgeon: Sanda Klein, MD;  Location: [facility] ENDOSCOPY;  Service: Cardiovascular;  Laterality: N/A;    Social History   Social History Narrative   Lives at home with parents.   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   Caffeine use: 3-4 cups daily.    family history includes Allergies in an other family member; Asthma in his maternal grandmother, mother, and paternal grandmother; Clotting disorder in his maternal uncle and mother; Colon polyps in his paternal grandmother; Diabetes in his father and paternal grandmother; Emphysema in his maternal grandmother; Heart attack in his mother; Heart disease in an other family member; Hypertension in his father; Lung cancer in his maternal grandmother; Migraines in his mother; Rheum arthritis in his mother; Skin cancer in his mother.   Review of Systems As per HPI  Objective:  Physical Exam BP 126/76   Pulse (!) 109   Ht 6\' 6"  (1.981 m)   Wt (!) 480 lb (217.7 kg)   BMI 55.47 kg/m

## 2021-09-12 NOTE — Patient Instructions (Addendum)

## 2021-09-15 ENCOUNTER — Other Ambulatory Visit: Payer: Self-pay | Admitting: Neurology

## 2021-09-15 DIAGNOSIS — E1169 Type 2 diabetes mellitus with other specified complication: Secondary | ICD-10-CM

## 2021-09-15 MED ORDER — MOUNJARO 2.5 MG/0.5ML ~~LOC~~ SOAJ
2.5000 mg | SUBCUTANEOUS | 2 refills | Status: DC
  Filled 2021-09-16 – 2021-11-04 (×3): qty 2, 28d supply, fill #0

## 2021-09-16 ENCOUNTER — Other Ambulatory Visit (HOSPITAL_COMMUNITY): Payer: Self-pay

## 2021-09-24 NOTE — Telephone Encounter (Signed)
error 

## 2021-11-04 ENCOUNTER — Other Ambulatory Visit (HOSPITAL_COMMUNITY): Payer: Self-pay

## 2021-11-19 DIAGNOSIS — Z0289 Encounter for other administrative examinations: Secondary | ICD-10-CM

## 2021-12-02 ENCOUNTER — Ambulatory Visit (INDEPENDENT_AMBULATORY_CARE_PROVIDER_SITE_OTHER): Payer: 59 | Admitting: Family Medicine

## 2021-12-02 ENCOUNTER — Encounter (INDEPENDENT_AMBULATORY_CARE_PROVIDER_SITE_OTHER): Payer: Self-pay | Admitting: Family Medicine

## 2021-12-02 ENCOUNTER — Other Ambulatory Visit: Payer: Self-pay

## 2021-12-02 VITALS — BP 122/78 | HR 111 | Temp 98.9°F | Ht 76.0 in | Wt >= 6400 oz

## 2021-12-02 DIAGNOSIS — R0602 Shortness of breath: Secondary | ICD-10-CM | POA: Diagnosis not present

## 2021-12-02 DIAGNOSIS — G43109 Migraine with aura, not intractable, without status migrainosus: Secondary | ICD-10-CM

## 2021-12-02 DIAGNOSIS — I10 Essential (primary) hypertension: Secondary | ICD-10-CM | POA: Diagnosis not present

## 2021-12-02 DIAGNOSIS — Z6841 Body Mass Index (BMI) 40.0 and over, adult: Secondary | ICD-10-CM

## 2021-12-02 DIAGNOSIS — E669 Obesity, unspecified: Secondary | ICD-10-CM

## 2021-12-02 DIAGNOSIS — R739 Hyperglycemia, unspecified: Secondary | ICD-10-CM

## 2021-12-02 DIAGNOSIS — R5383 Other fatigue: Secondary | ICD-10-CM | POA: Diagnosis not present

## 2021-12-02 DIAGNOSIS — D508 Other iron deficiency anemias: Secondary | ICD-10-CM

## 2021-12-02 DIAGNOSIS — F3289 Other specified depressive episodes: Secondary | ICD-10-CM

## 2021-12-02 DIAGNOSIS — Z9189 Other specified personal risk factors, not elsewhere classified: Secondary | ICD-10-CM

## 2021-12-02 NOTE — Progress Notes (Signed)
Chief Complaint:   OBESITY Alexis [name] (MR# [id number]) is a 36 y.o. male who presents for evaluation and treatment of obesity and related comorbidities. Current BMI is Body mass index is 58.06 kg/m. Alexis [name] has been struggling with his weight for many years and has been unsuccessful in either losing weight, maintaining weight loss, or reaching his healthy weight goal.  Alexis [name] is currently in the action stage of change and ready to dedicate time achieving and maintaining a healthier weight. Alexis [name] is interested in becoming our patient and working on intensive lifestyle modifications including (but not limited to) diet and exercise for weight loss.  Heard about clinic from Dr. Jaynee Eagles. Pt has considered lap band previously. 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 Breakfast- Alexis [name] S/E/Ch croissant or English muffin (1 sandwich), coffee with creamer (1/2 cup); Lunch- [employer]fil[employer] sandwich with fries (M) + sweet tea (L) (eat all sandwich, tea, and most fries (feel satisfied); Afternoon snack- chips or candy (1 Reese's cup) (thinks he's hungry); Dinner- [place] (chicken alfredo) or [place] (ACP and cheese dip and chips) takeout or 2 boneless skinless thighs + 3 scoops of veggies + yeast roll.  Alexis [name]'s habits were reviewed today and are as follows: His family eats meals together, he thinks his family will eat healthier with him, he struggles with family and or coworkers weight loss sabotage, he has been heavy most of his life, his heaviest weight ever was current weight of 477 pounds, he has significant food cravings issues, he snacks frequently in the evenings, he is frequently drinking liquids with calories, he frequently makes poor food choices, he has problems with excessive hunger, he frequently eats larger portions than normal, he has binge eating behaviors, and he struggles with emotional eating.  Depression Screen Alexis [name]'s Food and Mood (modified PHQ-9) score was  18.  Depression screen [name] 2/9 12/02/2021  Decreased Interest 3  Down, Depressed, Hopeless 3  PHQ - 2 Score 6  Altered sleeping 2  Tired, decreased energy 3  Change in appetite 2  Feeling bad or failure about yourself  1  Trouble concentrating 2  Moving slowly or fidgety/restless 1  Suicidal thoughts 1  PHQ-9 Score 18  Difficult doing work/chores Very difficult  Some encounter information is confidential and restricted. Go to Review Flowsheets activity to see all data.  Some recent data might be hidden   Subjective:   1. Other fatigue Alexis [name] admits to daytime somnolence and admits to waking up still tired. Patient has a history of symptoms of daytime fatigue, morning fatigue, nasal obstruction, and morning headache. Alexis [name] generally gets 4 or 5 hours of sleep per night, and states that he has poor sleep quality. Snoring is present. Apneic episodes are not present. Epworth Sleepiness Score is 7. EKG- Sinus tachycardia.  2. SOBOE (shortness of breath on exertion) Alexis [name] notes increasing shortness of breath with exercising and seems to be worsening over time with weight gain. He notes getting out of breath sooner with activity than he used to. This has gotten worse recently. Alexis [name] denies shortness of breath at rest or orthopnea.  3. Other iron deficiency anemia Alexis [name] takes ferrous sulfate. He sees GI.  4. Essential hypertension BP normally well controlled. Dx ~36 years old. Pt is on irbesartan-HCTZ.  5. Other depression Alexis [name] is on Wellbutrin in the morning and PRN Xanax. Questionable management. Pt's mood is lability.  6. Ocular migraine Pt sees Dr. Jaynee Eagles and is prescribed verapamil, Roselyn Meier, and  Ajovy.  7. Hyperglycemia No h/o diabetes or pre-diabetes.  8. At risk for deficient intake of food The patient is at a higher than average risk of deficient intake of food due to inadequate intake.  Assessment/Plan:   1. Other fatigue Alexis [name] does feel that his weight is causing  his energy to be lower than it should be. Fatigue may be related to obesity, depression or many other causes. Labs will be ordered, and in the meanwhile, Alexis [name] will focus on self care including making healthy food choices, increasing physical activity and focusing on stress reduction. Check labs today.  - EKG 12-Lead - Vitamin B12 - VITAMIN D 25 Hydroxy (Vit-D Deficiency, Fractures) - Folate - TSH - T4, free - T3  2. SOBOE (shortness of breath on exertion) Alexis [name] does feel that he gets out of breath more easily that he used to when he exercises. Alexis [name]'s shortness of breath appears to be obesity related and exercise induced. He has agreed to work on weight loss and gradually increase exercise to treat his exercise induced shortness of breath. Will continue to monitor closely.  3. Other iron deficiency anemia Check labs today.  - Retic - CBC w/Diff/Platelet  4. Essential hypertension Alexis [name] is working on healthy weight loss and exercise to improve blood pressure control. We will watch for signs of hypotension as he continues his lifestyle modifications. Check labs today.  - Comprehensive metabolic panel - Lipid Panel With LDL/HDL Ratio  5. Other depression Behavior modification techniques were discussed today to help Alexis [name] deal with his emotional/non-hunger eating behaviors.  Orders and follow up as documented in patient record. Pt may increase Wellbutrin to BID.  6. Ocular migraine F/u with Dr. Jaynee Eagles for further management.  7. Hyperglycemia Fasting labs will be obtained and results with be discussed with Alexis [name] in 2 weeks at his follow up visit. In the meanwhile Alexis [name] was started on a lower simple carbohydrate diet and will work on weight loss efforts.  - Hemoglobin A1c - Insulin, random  8. At risk for deficient intake of food [name] was given approximately 15 minutes of deficient intake of food prevention counseling today. Alexis [name] is at risk for eating too few calories  based on current food recall. He was encouraged to focus on meeting caloric and protein goals according to his recommended meal plan.   9. Obesity with current BMI of 58.2 Alexis [name] is currently in the action stage of change and his goal is to continue with weight loss efforts. I recommend Alexis [name] begin the structured treatment plan as follows:  He has agreed to the Category 4 Plan + 300 calories.  Exercise goals: No exercise has been prescribed at this time.   Behavioral modification strategies: increasing lean protein intake, meal planning and cooking strategies, keeping healthy foods in the home, and planning for success.  He was informed of the importance of frequent follow-up visits to maximize his success with intensive lifestyle modifications for his multiple health conditions. He was informed we would discuss his lab results at his next visit unless there is a critical issue that needs to be addressed sooner. Alexis [name] agreed to keep his next visit at the agreed upon time to discuss these results.  Objective:   Blood pressure 122/78, pulse (!) 111, temperature 98.9 F (37.2 C), height 6\' 4"  (1.93 m), weight (!) 477 lb (216.4 kg), SpO2 96 %. Body mass index is 58.06 kg/m.  EKG: sinus rhythm, rate 104- tachycardia.  Indirect Calorimeter completed today shows a VO2 of  474 and a REE of 3269.  His calculated basal metabolic rate is 5189 thus his basal metabolic rate is worse than expected.  General: Cooperative, alert, well developed, in no acute distress. HEENT: Conjunctivae and lids unremarkable. Cardiovascular: Regular rhythm.  Lungs: Normal work of breathing. Neurologic: No focal deficits.   Lab Results  Component Value Date   CREATININE 0.99 11/15/2018   BUN 8 11/15/2018   NA 139 11/15/2018   K 3.5 11/15/2018   CL 103 11/15/2018   CO2 27 11/15/2018   Lab Results  Component Value Date   ALT 32 09/17/2016   AST 25 09/17/2016   ALKPHOS 59 09/17/2016   BILITOT 0.5 09/17/2016    Lab Results  Component Value Date   HGBA1C 5.4 11/18/2018   HGBA1C 5.5 03/08/2007   No results found for: INSULIN Lab Results  Component Value Date   TSH 1.970 08/25/2016   Lab Results  Component Value Date   CHOL 163 02/02/2018   HDL 44.30 02/02/2018   LDLCALC 89 02/02/2018   TRIG 147.0 02/02/2018   CHOLHDL 4 02/02/2018   Lab Results  Component Value Date   WBC 10.5 09/12/2021   HGB 13.3 09/12/2021   HCT 41.1 09/12/2021   MCV 73.3 (L) 09/12/2021   PLT 254.0 09/12/2021   Lab Results  Component Value Date   IRON 28 (L) 05/08/2021   TIBC 309 05/08/2021   FERRITIN 33.9 09/12/2021     Attestation Statements:   Reviewed by clinician on day of visit: allergies, medications, problem list, medical history, surgical history, family history, social history, and previous encounter notes.  Coral Ceo, CMA, am acting as transcriptionist for Coralie Common, MD.  This is the patient's first visit at [facility]. The patient's NEW PATIENT PACKET was reviewed at length. Included in the packet: current and past health history, medications, allergies, ROS, gynecologic history (women only), surgical history, family history, social history, weight history, weight loss surgery history (for those that have had weight loss surgery), nutritional evaluation, mood and food questionnaire, PHQ9, Epworth questionnaire, sleep habits questionnaire, patient life and health improvement goals questionnaire. These will all be scanned into the patient's chart under media.   During the visit, I independently reviewed the patient's EKG, bioimpedance scale results, and indirect calorimeter results. I used this information to tailor a meal plan for the patient that will help him to lose weight and will improve his obesity-related conditions going forward. I performed a medically necessary appropriate examination and/or evaluation. I discussed the assessment and treatment plan with the  patient. The patient was provided an opportunity to ask questions and all were answered. The patient agreed with the plan and demonstrated an understanding of the instructions. Labs were ordered at this visit and will be reviewed at the next visit unless more critical results need to be addressed immediately. Clinical information was updated and documented in the EMR.   Time spent on visit including pre-visit chart review and post-visit care was 46 minutes.   A separate 15 minutes was spent on risk counseling (see above).   I have reviewed the above documentation for accuracy and completeness, and I agree with the above. - Coralie Common, MD

## 2021-12-03 LAB — CBC WITH DIFFERENTIAL/PLATELET
Basophils Absolute: 0.1 10*3/uL (ref 0.0–0.2)
Basos: 1 %
EOS (ABSOLUTE): 0.2 10*3/uL (ref 0.0–0.4)
Eos: 2 %
Hematocrit: 46 % (ref 37.5–51.0)
Hemoglobin: 14.8 g/dL (ref 13.0–17.7)
Immature Grans (Abs): 0.1 10*3/uL (ref 0.0–0.1)
Immature Granulocytes: 2 %
Lymphocytes Absolute: 2.2 10*3/uL (ref 0.7–3.1)
Lymphs: 26 %
MCH: 24.1 pg — ABNORMAL LOW (ref 26.6–33.0)
MCHC: 32.2 g/dL (ref 31.5–35.7)
MCV: 75 fL — ABNORMAL LOW (ref 79–97)
Monocytes Absolute: 0.8 10*3/uL (ref 0.1–0.9)
Monocytes: 9 %
Neutrophils Absolute: 5.2 10*3/uL (ref 1.4–7.0)
Neutrophils: 60 %
Platelets: 271 10*3/uL (ref 150–450)
RBC: 6.14 x10E6/uL — ABNORMAL HIGH (ref 4.14–5.80)
RDW: 17.1 % — ABNORMAL HIGH (ref 11.6–15.4)
WBC: 8.5 10*3/uL (ref 3.4–10.8)

## 2021-12-03 LAB — T3: T3, Total: 159 ng/dL (ref 71–180)

## 2021-12-03 LAB — COMPREHENSIVE METABOLIC PANEL
ALT: 26 IU/L (ref 0–44)
AST: 21 IU/L (ref 0–40)
Albumin/Globulin Ratio: 1.4 (ref 1.2–2.2)
Albumin: 4.1 g/dL (ref 4.0–5.0)
Alkaline Phosphatase: 75 IU/L (ref 44–121)
BUN/Creatinine Ratio: 8 — ABNORMAL LOW (ref 9–20)
BUN: 9 mg/dL (ref 6–20)
Bilirubin Total: 0.8 mg/dL (ref 0.0–1.2)
CO2: 25 mmol/L (ref 20–29)
Calcium: 9.4 mg/dL (ref 8.7–10.2)
Chloride: 99 mmol/L (ref 96–106)
Creatinine, Ser: 1.09 mg/dL (ref 0.76–1.27)
Globulin, Total: 2.9 g/dL (ref 1.5–4.5)
Glucose: 79 mg/dL (ref 70–99)
Potassium: 4.2 mmol/L (ref 3.5–5.2)
Sodium: 138 mmol/L (ref 134–144)
Total Protein: 7 g/dL (ref 6.0–8.5)
eGFR: 91 mL/min/{1.73_m2} (ref >59)

## 2021-12-03 LAB — LIPID PANEL WITH LDL/HDL RATIO
Cholesterol, Total: 177 mg/dL (ref 100–199)
HDL: 43 mg/dL (ref >39)
LDL Chol Calc (NIH): 111 mg/dL — ABNORMAL HIGH (ref 0–99)
LDL/HDL Ratio: 2.6 ratio (ref 0.0–3.6)
Triglycerides: 130 mg/dL (ref 0–149)
VLDL Cholesterol Cal: 23 mg/dL (ref 5–40)

## 2021-12-03 LAB — VITAMIN D 25 HYDROXY (VIT D DEFICIENCY, FRACTURES): Vit D, 25-Hydroxy: 17.7 ng/mL — ABNORMAL LOW (ref 30.0–100.0)

## 2021-12-03 LAB — RETICULOCYTES: Retic Ct Pct: 1.7 % (ref 0.6–2.6)

## 2021-12-03 LAB — TSH: TSH: 2.93 u[IU]/mL (ref 0.450–4.500)

## 2021-12-03 LAB — HEMOGLOBIN A1C
Est. average glucose Bld gHb Est-mCnc: 120 mg/dL
Hgb A1c MFr Bld: 5.8 % — ABNORMAL HIGH (ref 4.8–5.6)

## 2021-12-03 LAB — INSULIN, RANDOM: INSULIN: 34.5 u[IU]/mL — ABNORMAL HIGH (ref 2.6–24.9)

## 2021-12-03 LAB — FOLATE: Folate: 6.8 ng/mL (ref >3.0)

## 2021-12-03 LAB — T4, FREE: Free T4: 1.25 ng/dL (ref 0.82–1.77)

## 2021-12-03 LAB — VITAMIN B12: Vitamin B-12: 732 pg/mL (ref 232–1245)

## 2021-12-16 ENCOUNTER — Ambulatory Visit (INDEPENDENT_AMBULATORY_CARE_PROVIDER_SITE_OTHER): Payer: 59 | Admitting: Family Medicine

## 2021-12-19 ENCOUNTER — Encounter (INDEPENDENT_AMBULATORY_CARE_PROVIDER_SITE_OTHER): Payer: Self-pay | Admitting: Bariatrics

## 2021-12-19 ENCOUNTER — Other Ambulatory Visit: Payer: Self-pay

## 2021-12-19 ENCOUNTER — Ambulatory Visit (INDEPENDENT_AMBULATORY_CARE_PROVIDER_SITE_OTHER): Payer: 59 | Admitting: Bariatrics

## 2021-12-19 DIAGNOSIS — Z6841 Body Mass Index (BMI) 40.0 and over, adult: Secondary | ICD-10-CM

## 2021-12-19 DIAGNOSIS — D509 Iron deficiency anemia, unspecified: Secondary | ICD-10-CM

## 2021-12-19 DIAGNOSIS — R7303 Prediabetes: Secondary | ICD-10-CM

## 2021-12-19 DIAGNOSIS — E669 Obesity, unspecified: Secondary | ICD-10-CM

## 2021-12-19 DIAGNOSIS — E559 Vitamin D deficiency, unspecified: Secondary | ICD-10-CM

## 2021-12-19 MED ORDER — VITAMIN D (ERGOCALCIFEROL) 1.25 MG (50000 UNIT) PO CAPS: 50000.0000 [IU] | ORAL_CAPSULE | ORAL | 0 refills | Status: DC

## 2021-12-22 ENCOUNTER — Encounter (INDEPENDENT_AMBULATORY_CARE_PROVIDER_SITE_OTHER): Payer: Self-pay | Admitting: Bariatrics

## 2021-12-22 NOTE — Progress Notes (Signed)
? ? ? ?Chief Complaint:  ? ?OBESITY ?Alexis [name] is here to discuss his progress with his obesity treatment plan along with follow-up of his obesity related diagnoses. Alexis [name] is on the Category 4 Plan plus 300 calories and states he is following his eating plan approximately 20% of the time. Alexis [name] states he is doing 0 minutes 0 times per week. ? ?Today's visit was #: 2 ?Starting weight: 477 lbs ?Starting date: 12/02/2021 ?Today's weight: 481 lbs ?Today's date: 12/19/2021 ?Total lbs lost to date: 0 ?Total lbs lost since last in-office visit: 0 ? ?Interim History: Alexis [name] is up 4 lbs. He struggled with the diet. He has not been able to get the food. He is skipping less meals.  ? ?Subjective:  ? ?1. Pre-diabetes ?Alexis [name] is not on medications. His range fluctuates a lot. His last A1C was 5.8. His insulin was 34.5. ? ?2. Iron deficiency anemia, unspecified iron deficiency anemia type ?Alexis [name] is taking iron currently. He has had a colonoscopy and endoscopy. His Hgb and Hct was normal. His MCV and MCH was decreased. His RBC are increased.  ? ?3. Vitamin D deficiency ?Alexis [name] is currently not taking Vitamin D. His last Vitamin D was 17.7. ? ?Assessment/Plan:  ? ?1. Pre-diabetes ?Alexis [name] was provided information sheet on Metformin today. He will continue to work on weight loss, exercise, and decreasing simple carbohydrates to help decrease the risk of diabetes.  ? ?2. Iron deficiency anemia, unspecified iron deficiency anemia type ?Orders and follow up as documented in patient record. Alexis [name] will continue iron medication.  ? ?Counseling ?Iron is essential for our bodies to make red blood cells.  Reasons that someone may be deficient include: an iron-deficient diet (more likely in those following vegan or vegetarian diets), women with heavy menses, patients with GI disorders or poor absorption, patients that have had bariatric surgery, frequent blood donors, patients with cancer, and patients with heart disease.   ?An iron supplement  has been recommended. This is found over-the-counter.  ?Iron-rich foods include dark leafy greens, red and white meats, eggs, seafood, and beans.   ?Certain foods and drinks prevent your body from absorbing iron properly. Avoid eating these foods in the same meal as iron-rich foods or with iron supplements. These foods include: coffee, black tea, and red wine; milk, dairy products, and foods that are high in calcium; beans and soybeans; whole grains.  ?Constipation can be a side effect of iron supplementation. Increased water and fiber intake are helpful. Water goal: > 2 liters/day. Fiber goal: > 25 grams/day.  ? ?3. Vitamin D deficiency ?Low Vitamin D level contributes to fatigue and are associated with obesity, breast, and colon cancer. Alexis [name] agrees to start Vitamin D. We will fill prescription Vitamin D 50,000 IU every week for 1 month with no refills and he will follow-up for routine testing of Vitamin D, at least 2-3 times per year to avoid over-replacement. ? ?- Vitamin D, Ergocalciferol, (DRISDOL) 1.25 MG (50000 UNIT) CAPS capsule; Take 1 capsule (50,000 Units total) by mouth every 7 (seven) days.  Dispense: 4 capsule; Refill: 0 ? ?4. Obesity, current BMI 58.6 ?Alexis [name] is currently in the action stage of change. As such, his goal is to continue with weight loss efforts. He has agreed to the Category 4 Plan plus 300 calories.  ? ?Alexis [name] will continue meal planning. He will adhere to the plan 80-90%. We reviewed labs from 12/02/2021 CMP, Lipid panel, folate, Vitamin D, B 12, CBC, A1C and thyroid panel.  ? ?Exercise goals:  No exercise has been prescribed at this time. ? ?Behavioral modification strategies: increasing lean protein intake, decreasing simple carbohydrates, increasing vegetables, increasing water intake, decreasing eating out, no skipping meals, meal planning and cooking strategies, keeping healthy foods in the home, and planning for success. ? ?Alexis [name] has agreed to follow-up with our clinic in 2  weeks with Dr. Jearld Shines. He was informed of the importance of frequent follow-up visits to maximize his success with intensive lifestyle modifications for his multiple health conditions.  ? ?Objective:  ? ?Blood pressure 122/84, pulse (!) 107, temperature 98.1 ?F (36.7 ?C), height '6\' 4"'$  (1.93 m), weight (!) 481 lb (218.2 kg), SpO2 96 %. ?Body mass index is 58.55 kg/m?. ? ?General: Cooperative, alert, well developed, in no acute distress. ?HEENT: Conjunctivae and lids unremarkable. ?Cardiovascular: Regular rhythm.  ?Lungs: Normal work of breathing. ?Neurologic: No focal deficits.  ? ?Lab Results  ?Component Value Date  ? CREATININE 1.09 12/02/2021  ? BUN 9 12/02/2021  ? NA 138 12/02/2021  ? K 4.2 12/02/2021  ? CL 99 12/02/2021  ? CO2 25 12/02/2021  ? ?Lab Results  ?Component Value Date  ? ALT 26 12/02/2021  ? AST 21 12/02/2021  ? ALKPHOS 75 12/02/2021  ? BILITOT 0.8 12/02/2021  ? ?Lab Results  ?Component Value Date  ? HGBA1C 5.8 (H) 12/02/2021  ? HGBA1C 5.4 11/18/2018  ? HGBA1C 5.5 03/08/2007  ? ?Lab Results  ?Component Value Date  ? INSULIN 34.5 (H) 12/02/2021  ? ?Lab Results  ?Component Value Date  ? TSH 2.930 12/02/2021  ? ?Lab Results  ?Component Value Date  ? CHOL 177 12/02/2021  ? HDL 43 12/02/2021  ? LDLCALC 111 (H) 12/02/2021  ? TRIG 130 12/02/2021  ? CHOLHDL 4 02/02/2018  ? ?Lab Results  ?Component Value Date  ? VD25OH 17.7 (L) 12/02/2021  ? ?Lab Results  ?Component Value Date  ? WBC 8.5 12/02/2021  ? HGB 14.8 12/02/2021  ? HCT 46.0 12/02/2021  ? MCV 75 (L) 12/02/2021  ? PLT 271 12/02/2021  ? ?Lab Results  ?Component Value Date  ? IRON 28 (L) 05/08/2021  ? TIBC 309 05/08/2021  ? FERRITIN 33.9 09/12/2021  ? ?Attestation Statements:  ? ?Reviewed by clinician on day of visit: allergies, medications, problem list, medical history, surgical history, family history, social history, and previous encounter notes. ? ?I, Lizbeth Bark, RMA, am acting as transcriptionist for [employer], DO. ? ?I have reviewed the above  documentation for accuracy and completeness, and I agree with the above. Jearld Lesch, DO ? ?

## 2022-01-08 ENCOUNTER — Ambulatory Visit (INDEPENDENT_AMBULATORY_CARE_PROVIDER_SITE_OTHER): Payer: 59 | Admitting: Nurse Practitioner

## 2022-01-08 ENCOUNTER — Encounter (INDEPENDENT_AMBULATORY_CARE_PROVIDER_SITE_OTHER): Payer: Self-pay | Admitting: Nurse Practitioner

## 2022-01-08 VITALS — BP 137/89 | HR 95 | Temp 98.0°F | Ht 76.0 in | Wt >= 6400 oz

## 2022-01-08 DIAGNOSIS — E559 Vitamin D deficiency, unspecified: Secondary | ICD-10-CM | POA: Diagnosis not present

## 2022-01-08 DIAGNOSIS — R030 Elevated blood-pressure reading, without diagnosis of hypertension: Secondary | ICD-10-CM

## 2022-01-08 DIAGNOSIS — Z6841 Body Mass Index (BMI) 40.0 and over, adult: Secondary | ICD-10-CM | POA: Diagnosis not present

## 2022-01-08 DIAGNOSIS — R7303 Prediabetes: Secondary | ICD-10-CM | POA: Diagnosis not present

## 2022-01-08 DIAGNOSIS — E669 Obesity, unspecified: Secondary | ICD-10-CM

## 2022-01-08 MED ORDER — VITAMIN D (ERGOCALCIFEROL) 1.25 MG (50000 UNIT) PO CAPS: 50000.0000 [IU] | ORAL_CAPSULE | ORAL | 0 refills | Status: DC

## 2022-01-09 NOTE — Progress Notes (Signed)
? ? ? ?Chief Complaint:  ? ?OBESITY ?Alexis [name] is here to discuss his progress with his obesity treatment plan along with follow-up of his obesity related diagnoses. Alexis [name] is on the Category 4 Plan plus 300 calories and states he is following his eating plan approximately 60% of the time. Alexis [name] states he is doing 0 minutes 0 times per week. ? ?Today's visit was #: 3 ?Starting weight: 477 lbs ?Starting date: 12/02/2021 ?Today's weight: 485 lbs ?Today's date: 01/08/2022 ?Total lbs lost to date: 8 lbs ?Total lbs lost since last in-office visit: 0 ? ?Interim History: Alexis [name]'s last job was in January. Due to losing his job he wasn't able to start Category 4 due to cost. He started a new job in 3 weeks ago. He got his first paycheck and is eager to start the plan. He is struggling with craving sweets in the evenings and eating everything on his meal plan.  ? ?Subjective:  ? ?1. Pre-diabetes ?Alexis [name]'s last A1C was 5.8 and insulin was 34.5. He has a family history of diabetes mellitus type 2.  ? ?2. Vitamin D deficiency ?Alexis [name]'s last Vitamin D was 17.7. He is taking Vitamin 50,000 IU weekly. He denies side effects nausea, vomiting, and muscle weakness.  ? ?Assessment/Plan:  ? ?1. Pre-diabetes ?Alexis [name] will continue to work on weight loss, exercise, and decreasing simple carbohydrates to help decrease the risk of diabetes.  ? ?2. Vitamin D deficiency ?Low Vitamin D level contributes to fatigue and are associated with obesity, breast, and colon cancer. We will refill prescription Vitamin D 50,000 IU every week for 1 month with 1 and he  will follow-up for routine testing of Vitamin D, at least 2-3 times per year to avoid over-replacement. ? ?- Vitamin D, Ergocalciferol, (DRISDOL) 1.25 MG (50000 UNIT) CAPS capsule; Take 1 capsule (50,000 Units total) by mouth every 7 (seven) days.  Dispense: 4 capsule; Refill: 0 ? ?3. Obesity, current BMI 59.1 ?Alexis [name] is currently in the action stage of change. As such, his goal is to continue  with weight loss efforts. He has agreed to the Category 4 Plan.  ? ?Exercise goals:  Alexis [name] will start walking. He has a fitness center at his new job.  ? ?Behavioral modification strategies: increasing lean protein intake, increasing water intake, no skipping meals, and meal planning and cooking strategies. ? ?Alexis [name] has agreed to follow-up with our clinic in 2 weeks. He was informed of the importance of frequent follow-up visits to maximize his success with intensive lifestyle modifications for his multiple health conditions.  ? ?Objective:  ? ?Blood pressure 137/89, pulse 95, temperature 98 ?F (36.7 ?C), height '6\' 4"'$  (1.93 m), weight (!) 485 lb (220 kg), SpO2 95 %. ?Body mass index is 59.04 kg/m?. ? ?General: Cooperative, alert, well developed, in no acute distress. ?HEENT: Conjunctivae and lids unremarkable. ?Cardiovascular: Regular rhythm.  ?Lungs: Normal work of breathing. ?Neurologic: No focal deficits.  ? ?Lab Results  ?Component Value Date  ? CREATININE 1.09 12/02/2021  ? BUN 9 12/02/2021  ? NA 138 12/02/2021  ? K 4.2 12/02/2021  ? CL 99 12/02/2021  ? CO2 25 12/02/2021  ? ?Lab Results  ?Component Value Date  ? ALT 26 12/02/2021  ? AST 21 12/02/2021  ? ALKPHOS 75 12/02/2021  ? BILITOT 0.8 12/02/2021  ? ?Lab Results  ?Component Value Date  ? HGBA1C 5.8 (H) 12/02/2021  ? HGBA1C 5.4 11/18/2018  ? HGBA1C 5.5 03/08/2007  ? ?Lab Results  ?Component Value Date  ? INSULIN 34.5 (  H) 12/02/2021  ? ?Lab Results  ?Component Value Date  ? TSH 2.930 12/02/2021  ? ?Lab Results  ?Component Value Date  ? CHOL 177 12/02/2021  ? HDL 43 12/02/2021  ? LDLCALC 111 (H) 12/02/2021  ? TRIG 130 12/02/2021  ? CHOLHDL 4 02/02/2018  ? ?Lab Results  ?Component Value Date  ? VD25OH 17.7 (L) 12/02/2021  ? ?Lab Results  ?Component Value Date  ? WBC 8.5 12/02/2021  ? HGB 14.8 12/02/2021  ? HCT 46.0 12/02/2021  ? MCV 75 (L) 12/02/2021  ? PLT 271 12/02/2021  ? ?Lab Results  ?Component Value Date  ? IRON 28 (L) 05/08/2021  ? TIBC 309 05/08/2021   ? FERRITIN 33.9 09/12/2021  ? ?Attestation Statements:  ? ?Reviewed by clinician on day of visit: allergies, medications, problem list, medical history, surgical history, family history, social history, and previous encounter notes. ? ?Time spent on visit including pre-visit chart review and post-visit care and charting was 40 minutes.  ? ?I, Lizbeth Bark, RMA, am acting as Location manager for Everardo Pacific, FNP. ? ?I have reviewed the above documentation for accuracy and completeness, and I agree with the above. Everardo Pacific, FNP  ?

## 2022-01-17 ENCOUNTER — Telehealth: Payer: Self-pay | Admitting: Nurse Practitioner

## 2022-01-17 DIAGNOSIS — J029 Acute pharyngitis, unspecified: Secondary | ICD-10-CM

## 2022-01-17 MED ORDER — AZITHROMYCIN 250 MG PO TABS: ORAL_TABLET | ORAL | 0 refills | Status: DC

## 2022-01-17 NOTE — Progress Notes (Signed)
E-Visit for Sore Throat - Strep Symptoms ? ?We are sorry that you are not feeling well.  Here is how we plan to help! ? ?Based on what you have shared with me it is likely that you have strep pharyngitis.  Strep pharyngitis is inflammation and infection in the back of the throat.  This is an infection cause by bacteria and is treated with antibiotics.  I have prescribed Azithromycin 250 mg two tablets today and then one daily for 4 additional days. For throat pain, we recommend over the counter oral pain relief medications such as acetaminophen or aspirin, or anti-inflammatory medications such as ibuprofen or naproxen sodium. Topical treatments such as oral throat lozenges or sprays may be used as needed. Strep infections are not as easily transmitted as other respiratory infections, however we still recommend that you avoid close contact with loved ones, especially the very young and elderly.  Remember to wash your hands thoroughly throughout the day as this is the number one way to prevent the spread of infection and wipe down door knobs and counters with disinfectant. ? ? ?Home Care: ?Only take medications as instructed by your medical team. ?Complete the entire course of an antibiotic. ?Do not take these medications with alcohol. ?A steam or ultrasonic humidifier can help congestion.  You can place a towel over your head and breathe in the steam from hot water coming from a faucet. ?Avoid close contacts especially the very young and the elderly. ?Cover your mouth when you cough or sneeze. ?Always remember to wash your hands. ? ?Get Help Right Away If: ?You develop worsening fever or sinus pain. ?You develop a severe head ache or visual changes. ?Your symptoms persist after you have completed your treatment plan. ? ?Make sure you ?Understand these instructions. ?Will watch your condition. ?Will get help right away if you are not doing well or get worse. ? ? ?Thank you for choosing an e-visit. ? ?Your e-visit  answers were reviewed by a board certified advanced clinical practitioner to complete your personal care plan. Depending upon the condition, your plan could have included both over the counter or prescription medications. ? ?Please review your pharmacy choice. Make sure the pharmacy is open so you can pick up prescription now. If there is a problem, you may contact your provider through [employer] and have the prescription routed to another pharmacy.  Your safety is important to [place]. If you have drug allergies check your prescription carefully.  ? ?For the next 24 hours you can use MyChart to ask questions about today's visit, request a non-urgent call back, or ask for a work or school excuse. ?You will get an email in the next two days asking about your experience. I hope that your e-visit has been valuable and will speed your recovery. ? ?5-10 minutes spent reviewing and documenting in chart. ? ?

## 2022-01-29 ENCOUNTER — Encounter (INDEPENDENT_AMBULATORY_CARE_PROVIDER_SITE_OTHER): Payer: Self-pay | Admitting: Family Medicine

## 2022-01-29 ENCOUNTER — Ambulatory Visit (INDEPENDENT_AMBULATORY_CARE_PROVIDER_SITE_OTHER): Payer: 59 | Admitting: Family Medicine

## 2022-01-29 VITALS — BP 125/83 | HR 100 | Temp 98.6°F | Ht 76.0 in | Wt >= 6400 oz

## 2022-01-29 DIAGNOSIS — I10 Essential (primary) hypertension: Secondary | ICD-10-CM | POA: Diagnosis not present

## 2022-01-29 DIAGNOSIS — E559 Vitamin D deficiency, unspecified: Secondary | ICD-10-CM | POA: Diagnosis not present

## 2022-01-29 DIAGNOSIS — Z9189 Other specified personal risk factors, not elsewhere classified: Secondary | ICD-10-CM

## 2022-01-29 DIAGNOSIS — R7303 Prediabetes: Secondary | ICD-10-CM | POA: Diagnosis not present

## 2022-01-29 DIAGNOSIS — Z6841 Body Mass Index (BMI) 40.0 and over, adult: Secondary | ICD-10-CM

## 2022-01-29 DIAGNOSIS — E669 Obesity, unspecified: Secondary | ICD-10-CM | POA: Diagnosis not present

## 2022-01-29 MED ORDER — VITAMIN D (ERGOCALCIFEROL) 1.25 MG (50000 UNIT) PO CAPS: 50000.0000 [IU] | ORAL_CAPSULE | ORAL | 0 refills | Status: DC

## 2022-02-06 ENCOUNTER — Telehealth: Payer: Self-pay | Admitting: Emergency Medicine

## 2022-02-06 DIAGNOSIS — N3001 Acute cystitis with hematuria: Secondary | ICD-10-CM

## 2022-02-06 MED ORDER — CEPHALEXIN 500 MG PO CAPS: 500.0000 mg | ORAL_CAPSULE | Freq: Four times a day (QID) | ORAL | 0 refills | Status: AC

## 2022-02-06 NOTE — Progress Notes (Addendum)
E-Visit for Urinary Problems ? ?We are sorry that you are not feeling well.  Here is how we plan to help! ? ?Based on what you shared with me it looks like you most likely have a simple urinary tract infection. ? ?A UTI (Urinary Tract Infection) is a bacterial infection of the bladder. ? ?Most cases of urinary tract infections are simple to treat but a key part of your care is to encourage you to drink plenty of fluids and watch your symptoms carefully. ? ?I have prescribed cephalexin '500mg'$  4 times a day for 7 days.  Your symptoms should gradually improve. Please be seen in person if our urinary symptoms worsen, you develop worsening fever, back pain or pelvic pain or if your symptoms do not resolve after completing the antibiotic. ? ?Urinary tract infections can be prevented by drinking plenty of water to keep your body hydrated.  Also don't hold it in!  If possible, empty your bladder every 4 hours. ? ?HOME CARE ?Drink plenty of fluids ?Compete the full course of the antibiotics even if the symptoms resolve ?Remember, when you need to go?go. Holding in your urine can increase the likelihood of getting a UTI! ?GET HELP RIGHT AWAY IF: ?You cannot urinate ?You get a high fever ?Worsening back pain occurs ?You see blood in your urine ?You feel sick to your stomach or throw up ?You feel like you are going to pass out ? ?MAKE SURE YOU  ?Understand these instructions. ?Will watch your condition. ?Will get help right away if you are not doing well or get worse. ? ? ?Thank you for choosing an e-visit. ? ?Your e-visit answers were reviewed by a board certified advanced clinical practitioner to complete your personal care plan. Depending upon the condition, your plan could have included both over the counter or prescription medications. ? ?Please review your pharmacy choice. Make sure the pharmacy is open so you can pick up prescription now. If there is a problem, you may contact your provider through [employer] and  have the prescription routed to another pharmacy.  Your safety is important to [place]. If you have drug allergies check your prescription carefully.  ? ?For the next 24 hours you can use MyChart to ask questions about today's visit, request a non-urgent call back, or ask for a work or school excuse. ?You will get an email in the next two days asking about your experience. I hope that your e-visit has been valuable and will speed your recovery. ? ?I have spent 5 minutes in review of e-visit questionnaire, review and updating patient chart, medical decision making and response to patient.  ? ?Willeen Cass, PhD, FNP-BC ?  ?

## 2022-02-12 ENCOUNTER — Ambulatory Visit (INDEPENDENT_AMBULATORY_CARE_PROVIDER_SITE_OTHER): Payer: 59 | Admitting: Family Medicine

## 2022-02-16 NOTE — Progress Notes (Signed)
? ? ? ?Chief Complaint:  ? ?OBESITY ?Alexis [name] is here to discuss his progress with his obesity treatment plan along with follow-up of his obesity related diagnoses. Alexis [name] is on the Category 4 Plan and states he is following his eating plan approximately 80% of the time. Alexis [name] states he is walking/cardio for 10-30 minutes 2-3 times per week. ? ?Today's visit was #: 4 ?Starting weight: 477 lbs ?Starting date: 12/02/2021 ?Today's weight: 482 lbs ?Today's date: 01/29/2022 ?Total lbs lost to date: 0 ?Total lbs lost since last in-office visit: 3 ? ?Interim History: First office visit with me. Missed his last visit due to a CX appointment. Last office visit was with Colletta Maryland, NP on 01/08/2022. Since last office visit, he started exercising as well. Unable to eat all of the food on the plan.  ? ?Subjective:  ? ?1. Benign essential hypertension ?Taking Lasix, Avalide, and Verelan PM. Medication management per specialists.PCP. Patient is asymptomatic with no concerns today.  ? ?2. Vitamin D deficiency ?Last Vitamin D level was drawn 2 months ago, and was 17.7. No at goal. Patient states taking regularly with no side effects.  ? ?3. Pre-diabetes ?Last A1c was 5.8 and fasting insulin 34.5 approximately 2 months ago. Patient is without cravings or hunger. Skips food on the plan and tends to have some snacking in the evenings.  ? ?4. At risk for heart disease ?Alexis [name] is at higher than average risk for cardiovascular disease due to obesity, pre-diabetes, hypertension, and very high visceral fat rating at 38. ? ?Assessment/Plan:  ?No orders of the defined types were placed in this encounter. ? ? ?Medications Discontinued During This Encounter  ?Medication Reason  ? Vitamin D, Ergocalciferol, (DRISDOL) 1.25 MG (50000 UNIT) CAPS capsule Reorder  ?  ? ?Meds ordered this encounter  ?Medications  ? Vitamin D, Ergocalciferol, (DRISDOL) 1.25 MG (50000 UNIT) CAPS capsule  ?  Sig: Take 1 capsule (50,000 Units total) by mouth every 7 (seven)  days.  ?  Dispense:  4 capsule  ?  Refill:  0  ?  ? ?1. Benign essential hypertension ?Blood pressure is at goal. Continue medications per PCP/specialists. Decrease salt, increase water, continue PNP and weight loss.  ? ?2. Vitamin D deficiency ?We will refill prescription Vitamin D 50,000 IU every week for 1 month. [place] will follow-up for routine testing of Vitamin D, at least 2-3 times per year to avoid over-replacement. ? ?- Vitamin D, Ergocalciferol, (DRISDOL) 1.25 MG (50000 UNIT) CAPS capsule; Take 1 capsule (50,000 Units total) by mouth every 7 (seven) days.  Dispense: 4 capsule; Refill: 0 ? ?3. Pre-diabetes ?No medications currently. We will consider metformin in the future to help with snacking/cravings. Since he is not eating enough now, I wouldn't recommend a GLP-1 at this time.  ? ?4. At risk for heart disease ?Alexis [name] was given approximately 10 minutes of coronary artery disease prevention counseling today. He is 36 y.o. male and has risk factors for heart disease including obesity. We discussed intensive lifestyle modifications today with an emphasis on specific weight loss instructions and strategies. ? ?Repetitive spaced learning was employed today to elicit superior memory formation and behavioral change.  ? ?5. Obesity, current BMI 58.8 ?Alexis [name] is currently in the action stage of change. As such, his goal is to continue with weight loss efforts. He has agreed to keeping a food journal and adhering to recommended goals of 2500 calories and 180+ grams of protein daily.  ? ?Bring in journal to next office visit of  total calories and total grams of protein per day.  ? ?Exercise goals: As is. ? ?Behavioral modification strategies: increasing lean protein intake, decreasing simple carbohydrates, no skipping meals, and better snacking choices. ? ?Alexis [name] has agreed to follow-up with our clinic in 3 weeks. He was informed of the importance of frequent follow-up visits to maximize his success with intensive  lifestyle modifications for his multiple health conditions.  ? ?Objective:  ? ?Blood pressure 125/83, pulse 100, temperature 98.6 ?F (37 ?C), height '6\' 4"'$  (1.93 m), weight (!) 482 lb (218.6 kg), SpO2 97 %. ?Body mass index is 58.67 kg/m?. ? ?General: Cooperative, alert, well developed, in no acute distress. ?HEENT: Conjunctivae and lids unremarkable. ?Cardiovascular: Regular rhythm.  ?Lungs: Normal work of breathing. ?Neurologic: No focal deficits.  ? ?Lab Results  ?Component Value Date  ? CREATININE 1.09 12/02/2021  ? BUN 9 12/02/2021  ? NA 138 12/02/2021  ? K 4.2 12/02/2021  ? CL 99 12/02/2021  ? CO2 25 12/02/2021  ? ?Lab Results  ?Component Value Date  ? ALT 26 12/02/2021  ? AST 21 12/02/2021  ? ALKPHOS 75 12/02/2021  ? BILITOT 0.8 12/02/2021  ? ?Lab Results  ?Component Value Date  ? HGBA1C 5.8 (H) 12/02/2021  ? HGBA1C 5.4 11/18/2018  ? HGBA1C 5.5 03/08/2007  ? ?Lab Results  ?Component Value Date  ? INSULIN 34.5 (H) 12/02/2021  ? ?Lab Results  ?Component Value Date  ? TSH 2.930 12/02/2021  ? ?Lab Results  ?Component Value Date  ? CHOL 177 12/02/2021  ? HDL 43 12/02/2021  ? LDLCALC 111 (H) 12/02/2021  ? TRIG 130 12/02/2021  ? CHOLHDL 4 02/02/2018  ? ?Lab Results  ?Component Value Date  ? VD25OH 17.7 (L) 12/02/2021  ? ?Lab Results  ?Component Value Date  ? WBC 8.5 12/02/2021  ? HGB 14.8 12/02/2021  ? HCT 46.0 12/02/2021  ? MCV 75 (L) 12/02/2021  ? PLT 271 12/02/2021  ? ?Lab Results  ?Component Value Date  ? IRON 28 (L) 05/08/2021  ? TIBC 309 05/08/2021  ? FERRITIN 33.9 09/12/2021  ? ?Attestation Statements:  ? ?Reviewed by clinician on day of visit: allergies, medications, problem list, medical history, surgical history, family history, social history, and previous encounter notes. ? ? ?I, Trixie Dredge, am acting as transcriptionist for [employer], DO. ? ?I have reviewed the above documentation for accuracy and completeness, and I agree with the above. Marjory Sneddon, D.O. ? ?The [place]  was signed into law in 2016 which includes the topic of electronic health records.  This provides immediate access to information in MyChart.  This includes consultation notes, operative notes, office notes, lab results and pathology reports.  If you have any questions about what you read please let us know at your next visit so we can discuss your concerns and take corrective action if need be.  We are right here with you. ? ? ?

## 2022-02-26 ENCOUNTER — Ambulatory Visit (INDEPENDENT_AMBULATORY_CARE_PROVIDER_SITE_OTHER): Payer: 59 | Admitting: Family Medicine

## 2022-02-26 ENCOUNTER — Encounter (INDEPENDENT_AMBULATORY_CARE_PROVIDER_SITE_OTHER): Payer: Self-pay | Admitting: Family Medicine

## 2022-02-26 VITALS — BP 129/83 | HR 96 | Temp 98.8°F | Ht 76.0 in | Wt >= 6400 oz

## 2022-02-26 DIAGNOSIS — I10 Essential (primary) hypertension: Secondary | ICD-10-CM

## 2022-02-26 DIAGNOSIS — E669 Obesity, unspecified: Secondary | ICD-10-CM | POA: Diagnosis not present

## 2022-02-26 DIAGNOSIS — Z6841 Body Mass Index (BMI) 40.0 and over, adult: Secondary | ICD-10-CM | POA: Diagnosis not present

## 2022-02-26 DIAGNOSIS — E559 Vitamin D deficiency, unspecified: Secondary | ICD-10-CM

## 2022-02-26 MED ORDER — VITAMIN D (ERGOCALCIFEROL) 1.25 MG (50000 UNIT) PO CAPS: 50000.0000 [IU] | ORAL_CAPSULE | ORAL | 0 refills | Status: DC

## 2022-03-06 NOTE — Progress Notes (Unsigned)
Chief Complaint:   OBESITY Alexis [name] is here to discuss his progress with his obesity treatment plan along with follow-up of his obesity related diagnoses. Alexis [name] is on keeping a food journal and adhering to recommended goals of 2500 calories and 180+ grams of protein and states he is following his eating plan approximately 75% of the time. Alexis [name] states he is using treadmill 10-15 minutes 2-3 times per week.  Today's visit was #: 5 Starting weight: 477 lbs Starting date: 12/02/2021 Today's weight: 481 lbs Today's date: 02/26/2022 Total lbs lost to date: 0 Total lbs lost since last in-office visit: 1  Interim History: Alexis [name] is trying to get breakfast and protein in daily. Ending up close to 2500 cal a day and unsure of protein. Feels he does have less compliance on the weekends. He felt somewhat overwhelmed with food choices. Has no plans currently for Memorial Day weekend.  Subjective:   1. Vitamin D deficiency Alexis [name] is currently on prescription Vit D 50,000 IU weekly. Denies any nausea, vomiting or muscle weakness. He notes fatigue.  2. Essential hypertension Alexis [name]'s blood pressure is well controlled today. Denies chest pain, chest pressure and headache.  Assessment/Plan:   1. Vitamin D deficiency We will refill Vit D 50,000 UI once weekly for 1 month with no refills.  -Refill Vitamin D, Ergocalciferol, (DRISDOL) 1.25 MG (50000 UNIT) CAPS capsule; Take 1 capsule (50,000 Units total) by mouth every 7 (seven) days.  Dispense: 4 capsule; Refill: 0  2. Essential hypertension Alexis [name] will continue with current medications with no changes in dose.  3. Obesity with current BMI of 58.8 Alexis [name] is currently in the action stage of change. As such, his goal is to continue with weight loss efforts. He has agreed to the Category 4 Plan + 300.  Exercise goals: All adults should avoid inactivity. Some physical activity is better than none, and adults who participate in any amount of  physical activity gain some health benefits.  Behavioral modification strategies: increasing lean protein intake, meal planning and cooking strategies, keeping healthy foods in the home, and planning for success.  Alexis [name] has agreed to follow-up with our clinic in 3 weeks. He was informed of the importance of frequent follow-up visits to maximize his success with intensive lifestyle modifications for his multiple health conditions.   Objective:   Blood pressure 129/83, pulse 96, temperature 98.8 F (37.1 C), height '6\' 4"'$  (1.93 m), weight (!) 481 lb (218.2 kg), SpO2 100 %. Body mass index is 58.55 kg/m.  General: Cooperative, alert, well developed, in no acute distress. HEENT: Conjunctivae and lids unremarkable. Cardiovascular: Regular rhythm.  Lungs: Normal work of breathing. Neurologic: No focal deficits.   Lab Results  Component Value Date   CREATININE 1.09 12/02/2021   BUN 9 12/02/2021   NA 138 12/02/2021   K 4.2 12/02/2021   CL 99 12/02/2021   CO2 25 12/02/2021   Lab Results  Component Value Date   ALT 26 12/02/2021   AST 21 12/02/2021   ALKPHOS 75 12/02/2021   BILITOT 0.8 12/02/2021   Lab Results  Component Value Date   HGBA1C 5.8 (H) 12/02/2021   HGBA1C 5.4 11/18/2018   HGBA1C 5.5 03/08/2007   Lab Results  Component Value Date   INSULIN 34.5 (H) 12/02/2021   Lab Results  Component Value Date   TSH 2.930 12/02/2021   Lab Results  Component Value Date   CHOL 177 12/02/2021   HDL 43 12/02/2021   LDLCALC 111 (H) 12/02/2021  TRIG 130 12/02/2021   CHOLHDL 4 02/02/2018   Lab Results  Component Value Date   VD25OH 17.7 (L) 12/02/2021   Lab Results  Component Value Date   WBC 8.5 12/02/2021   HGB 14.8 12/02/2021   HCT 46.0 12/02/2021   MCV 75 (L) 12/02/2021   PLT 271 12/02/2021   Lab Results  Component Value Date   IRON 28 (L) 05/08/2021   TIBC 309 05/08/2021   FERRITIN 33.9 09/12/2021   Attestation Statements:   Reviewed by clinician on day  of visit: allergies, medications, problem list, medical history, surgical history, family history, social history, and previous encounter notes.  I, Elnora Morrison, RMA am acting as transcriptionist for Coralie Common, MD. I have reviewed the above documentation for accuracy and completeness, and I agree with the above. - Coralie Common, MD

## 2022-03-19 ENCOUNTER — Ambulatory Visit (INDEPENDENT_AMBULATORY_CARE_PROVIDER_SITE_OTHER): Payer: 59 | Admitting: Family Medicine

## 2022-03-24 ENCOUNTER — Ambulatory Visit
Admission: RE | Admit: 2022-03-24 | Discharge: 2022-03-24 | Disposition: A | Discharge status: Home / Self Care | Payer: BC Managed Care – PPO | Source: Ambulatory Visit | Attending: Emergency Medicine | Admitting: Emergency Medicine

## 2022-03-24 ENCOUNTER — Emergency Department (HOSPITAL_BASED_OUTPATIENT_CLINIC_OR_DEPARTMENT_OTHER)
Admission: EM | Admit: 2022-03-24 | Discharge: 2022-03-24 | Disposition: A | Discharge status: Home / Self Care | Payer: BC Managed Care – PPO | Attending: Emergency Medicine | Admitting: Emergency Medicine

## 2022-03-24 ENCOUNTER — Encounter (HOSPITAL_BASED_OUTPATIENT_CLINIC_OR_DEPARTMENT_OTHER): Payer: Self-pay

## 2022-03-24 ENCOUNTER — Emergency Department (HOSPITAL_BASED_OUTPATIENT_CLINIC_OR_DEPARTMENT_OTHER): Payer: BC Managed Care – PPO

## 2022-03-24 ENCOUNTER — Other Ambulatory Visit: Payer: Self-pay

## 2022-03-24 ENCOUNTER — Emergency Department (HOSPITAL_BASED_OUTPATIENT_CLINIC_OR_DEPARTMENT_OTHER): Payer: BC Managed Care – PPO | Admitting: Radiology

## 2022-03-24 VITALS — BP 130/77 | HR 117 | Temp 101.0°F | Resp 20

## 2022-03-24 DIAGNOSIS — Z20822 Contact with and (suspected) exposure to covid-19: Secondary | ICD-10-CM | POA: Diagnosis not present

## 2022-03-24 DIAGNOSIS — E860 Dehydration: Secondary | ICD-10-CM | POA: Insufficient documentation

## 2022-03-24 DIAGNOSIS — R0602 Shortness of breath: Secondary | ICD-10-CM | POA: Diagnosis not present

## 2022-03-24 DIAGNOSIS — R1030 Lower abdominal pain, unspecified: Secondary | ICD-10-CM | POA: Diagnosis not present

## 2022-03-24 DIAGNOSIS — R350 Frequency of micturition: Secondary | ICD-10-CM

## 2022-03-24 DIAGNOSIS — R000 Tachycardia, unspecified: Secondary | ICD-10-CM

## 2022-03-24 DIAGNOSIS — Z79899 Other long term (current) drug therapy: Secondary | ICD-10-CM | POA: Insufficient documentation

## 2022-03-24 DIAGNOSIS — R509 Fever, unspecified: Secondary | ICD-10-CM | POA: Diagnosis not present

## 2022-03-24 DIAGNOSIS — N3000 Acute cystitis without hematuria: Secondary | ICD-10-CM | POA: Diagnosis not present

## 2022-03-24 DIAGNOSIS — K8020 Calculus of gallbladder without cholecystitis without obstruction: Secondary | ICD-10-CM | POA: Diagnosis not present

## 2022-03-24 DIAGNOSIS — N3289 Other specified disorders of bladder: Secondary | ICD-10-CM | POA: Diagnosis not present

## 2022-03-24 LAB — URINALYSIS, ROUTINE W REFLEX MICROSCOPIC
Bilirubin Urine: NEGATIVE
Glucose, UA: NEGATIVE mg/dL
Ketones, ur: NEGATIVE mg/dL
Nitrite: NEGATIVE
Specific Gravity, Urine: 1.022 (ref 1.005–1.030)
pH: 6 (ref 5.0–8.0)

## 2022-03-24 LAB — CBC WITH DIFFERENTIAL/PLATELET
Abs Immature Granulocytes: 0.46 10*3/uL — ABNORMAL HIGH (ref 0.00–0.07)
Basophils Absolute: 0.1 10*3/uL (ref 0.0–0.1)
Basophils Relative: 0 %
Eosinophils Absolute: 0.2 10*3/uL (ref 0.0–0.5)
Eosinophils Relative: 1 %
HCT: 41.3 % (ref 39.0–52.0)
Hemoglobin: 12.9 g/dL — ABNORMAL LOW (ref 13.0–17.0)
Immature Granulocytes: 3 %
Lymphocytes Relative: 12 %
Lymphs Abs: 2.2 10*3/uL (ref 0.7–4.0)
MCH: 24.1 pg — ABNORMAL LOW (ref 26.0–34.0)
MCHC: 31.2 g/dL (ref 30.0–36.0)
MCV: 77.2 fL — ABNORMAL LOW (ref 80.0–100.0)
Monocytes Absolute: 1.4 10*3/uL — ABNORMAL HIGH (ref 0.1–1.0)
Monocytes Relative: 8 %
Neutro Abs: 14.4 10*3/uL — ABNORMAL HIGH (ref 1.7–7.7)
Neutrophils Relative %: 76 %
Platelets: 314 10*3/uL (ref 150–400)
RBC: 5.35 MIL/uL (ref 4.22–5.81)
RDW: 15.9 % — ABNORMAL HIGH (ref 11.5–15.5)
WBC: 18.7 10*3/uL — ABNORMAL HIGH (ref 4.0–10.5)
nRBC: 0 % (ref 0.0–0.2)

## 2022-03-24 LAB — COMPREHENSIVE METABOLIC PANEL
ALT: 18 U/L (ref 0–44)
AST: 16 U/L (ref 15–41)
Albumin: 3.9 g/dL (ref 3.5–5.0)
Alkaline Phosphatase: 53 U/L (ref 38–126)
Anion gap: 11 (ref 5–15)
BUN: 8 mg/dL (ref 6–20)
CO2: 27 mmol/L (ref 22–32)
Calcium: 9.6 mg/dL (ref 8.9–10.3)
Chloride: 97 mmol/L — ABNORMAL LOW (ref 98–111)
Creatinine, Ser: 1.21 mg/dL (ref 0.61–1.24)
GFR, Estimated: >60 mL/min (ref >60)
Glucose, Bld: 93 mg/dL (ref 70–99)
Potassium: 3.2 mmol/L — ABNORMAL LOW (ref 3.5–5.1)
Sodium: 135 mmol/L (ref 135–145)
Total Bilirubin: 0.9 mg/dL (ref 0.3–1.2)
Total Protein: 7.7 g/dL (ref 6.5–8.1)

## 2022-03-24 LAB — RESP PANEL BY RT-PCR (FLU A&B, COVID) ARPGX2
Influenza A by PCR: NEGATIVE
Influenza B by PCR: NEGATIVE
SARS Coronavirus 2 by RT PCR: NEGATIVE

## 2022-03-24 LAB — POCT URINALYSIS DIP (MANUAL ENTRY)
Bilirubin, UA: NEGATIVE
Glucose, UA: NEGATIVE mg/dL
Ketones, POC UA: NEGATIVE mg/dL
Nitrite, UA: NEGATIVE
Spec Grav, UA: 1.02 (ref 1.010–1.025)
Urobilinogen, UA: 0.2 E.U./dL
pH, UA: 8.5 — AB (ref 5.0–8.0)

## 2022-03-24 LAB — LACTIC ACID, PLASMA
Lactic Acid, Venous: 3 mmol/L (ref 0.5–1.9)
Lactic Acid, Venous: 0.5 mmol/L (ref 0.5–1.9)

## 2022-03-24 LAB — POCT RAPID STREP A (OFFICE): Rapid Strep A Screen: NEGATIVE

## 2022-03-24 MED ORDER — SODIUM CHLORIDE 0.9 % IV SOLN
1.0000 g | Freq: Once | INTRAVENOUS | Status: AC
  Administered 2022-03-24: 1 g via INTRAVENOUS
  Filled 2022-03-24: qty 10

## 2022-03-24 MED ORDER — CEFPODOXIME PROXETIL 200 MG PO TABS: 200.0000 mg | ORAL_TABLET | Freq: Two times a day (BID) | ORAL | 0 refills | Status: AC

## 2022-03-24 MED ORDER — POTASSIUM CHLORIDE CRYS ER 20 MEQ PO TBCR
40.0000 meq | EXTENDED_RELEASE_TABLET | Freq: Once | ORAL | Status: AC
Start: 2022-03-24 — End: 2022-03-24
  Administered 2022-03-24: 40 meq via ORAL
  Filled 2022-03-24: qty 2

## 2022-03-24 MED ORDER — SODIUM CHLORIDE 0.9 % IV BOLUS
1000.0000 mL | Freq: Once | INTRAVENOUS | Status: AC
  Administered 2022-03-24: 1000 mL via INTRAVENOUS

## 2022-03-24 MED ORDER — ONDANSETRON HCL 4 MG/2ML IJ SOLN
4.0000 mg | Freq: Once | INTRAMUSCULAR | Status: AC
  Administered 2022-03-24: 4 mg via INTRAVENOUS
  Filled 2022-03-24: qty 2

## 2022-03-24 MED ORDER — ACETAMINOPHEN 325 MG PO TABS
650.0000 mg | ORAL_TABLET | Freq: Once | ORAL | Status: AC
  Administered 2022-03-24: 650 mg via ORAL

## 2022-03-24 MED ORDER — IOHEXOL 300 MG/ML  SOLN
100.0000 mL | Freq: Once | INTRAMUSCULAR | Status: AC | PRN
Start: 2022-03-24 — End: 2022-03-24
  Administered 2022-03-24: 100 mL via INTRAVENOUS

## 2022-03-24 MED ORDER — ONDANSETRON 4 MG PO TBDP: 4.0000 mg | ORAL_TABLET | Freq: Three times a day (TID) | ORAL | 0 refills | Status: DC | PRN

## 2022-03-24 NOTE — ED Provider Notes (Signed)
Alexis [name]    CSN: [id number] Arrival date & time: 03/24/22  1645      History   Chief Complaint Chief Complaint  Patient presents with   Urinary Frequency    Entered by patient   Fever   Cough    HPI Alexis [name] is a 36 y.o. male.  Patient presents with 6-day history of urinary frequency; worse today; developed lower abdominal pressure in bladder area and has increased frequency.  He also reports fever, chills, body aches, cough, and shortness of breath today.  He denies rash, sore throat, vomiting, diarrhea, penile discharge, testicular pain, or other symptoms.  Last bowel movement today.  No OTC medications taken today.  His medical history includes hypertension, obesity, asthma, allergic rhinitis, dysmetabolic syndrome, IBS, GERD, ADD, depression, anxiety, appendectomy.    The history is provided by the patient and medical records.    Past Medical History:  Diagnosis Date   Acid reflux    ADD 09/13/2007   ALLERGIC RHINITIS 06/10/2009   Anemia    Anxiety    ASTHMA, UNSPECIFIED, UNSPECIFIED STATUS 07/19/2008   Back pain    Bacterial sinusitis    Bilateral swelling of feet    C. difficile diarrhea    Dental abscess    caused sinusitis   DEPRESSIVE DISORDER 33/35/4562   DYSMETABOLIC SYNDROME 56/38/9373   Gallbladder problem    GERD 09/13/2007   Headache    HYPERLIPIDEMIA 10/10/2009   HYPERTENSION 09/13/2007   IBS 03/20/2009   IBS (irritable bowel syndrome)    Joint pain    Morbid obesity ([place]) 07/19/2008   Ocular migraine    Palpitations    Skin cancer    SOB (shortness of breath)     Patient Active Problem List   Diagnosis Date Noted   Iron deficiency anemia    Gastric polyps    Episodic cluster headache, not intractable 10/17/2019   Hyperglycemia 11/16/2018   Need for HPV vaccination 11/16/2018   Diarrhea 12/20/2017   High risk homosexual behavior 05/19/2017   Myopia of both eyes 12/30/2016   Primary osteoarthritis involving multiple  joints 07/06/2014   Hyperlipidemia LDL goal <130 10/10/2009   Morbid obesity ([place]) 07/19/2008   Mild persistent asthma 07/19/2008   GAD (generalized anxiety disorder) 09/13/2007   Essential hypertension 09/13/2007   GERD (gastroesophageal reflux disease) 09/13/2007    Past Surgical History:  Procedure Laterality Date   ANKLE SURGERY  10/12/2005   APPENDECTOMY  10/13/2003   BIOPSY  06/02/2021   Procedure: BIOPSY;  Surgeon: Gatha Mayer, MD;  Location: [facility];  Service: Endoscopy;;  EGD and COLON   COLONOSCOPY WITH PROPOFOL N/A 06/02/2021   Procedure: COLONOSCOPY WITH PROPOFOL;  Surgeon: Gatha Mayer, MD;  Location: [facility];  Service: Endoscopy;  Laterality: N/A;   ESOPHAGOGASTRODUODENOSCOPY (EGD) WITH PROPOFOL N/A 06/02/2021   Procedure: ESOPHAGOGASTRODUODENOSCOPY (EGD) WITH PROPOFOL;  Surgeon: Gatha Mayer, MD;  Location: [facility];  Service: Endoscopy;  Laterality: N/A;   IR GENERIC HISTORICAL  07/23/2016   IR FLUORO GUIDED NEEDLE PLC ASPIRATION/INJECTION LOC 07/23/2016 Arne Cleveland, MD [facility]   LASIK Bilateral 2018   SKIN CANCER EXCISION     teenager   TEE WITHOUT CARDIOVERSION N/A 04/15/2016   Procedure: TRANSESOPHAGEAL ECHOCARDIOGRAM (TEE);  Surgeon: Sanda Klein, MD;  Location: [facility] ENDOSCOPY;  Service: Cardiovascular;  Laterality: N/A;       Home Medications    Prior to Admission medications   Medication Sig Start Date End Date Taking? Authorizing  Provider  ALPRAZolam Duanne Moron) 1 MG tablet Take 1 tablet (1 mg total) by mouth 2 (two) times daily as needed for anxiety. 02/02/18   Janith Lima, MD  azithromycin (ZITHROMAX Z-PAK) 250 MG tablet As directed 01/17/22   Hassell Done, Mary-Margaret, FNP  buPROPion (WELLBUTRIN XL) 150 MG 24 hr tablet Take 150 mg by mouth daily.    [provider]  cetirizine (ZYRTEC) 10 MG tablet 1 tablet    [provider]  EPINEPHrine 0.3 mg/0.3 mL IJ SOAJ injection Inject into the muscle once.     [provider]  Ferrous Sulfate (IRON) 325 (65 Fe) MG TABS Take 1 tablet (325 mg total) by mouth daily. 06/17/21   Gatha Mayer, MD  Fremanezumab-vfrm (AJOVY) 225 MG/1.5ML SOAJ Inject 225 mg into the skin every 30 (thirty) days. 05/28/21   Melvenia Beam, MD  furosemide (LASIX) 20 MG tablet Take 1 tablet (20 mg total) by mouth daily. Patient taking differently: Take 20 mg by mouth daily as needed. 06/02/18   Janith Lima, MD  irbesartan-hydrochlorothiazide (AVALIDE) 300-12.5 MG tablet TAKE 1 TABLET BY MOUTH EVERY DAY NEEDS APPT FOR REFILLS 11/24/18   Janith Lima, MD  Magnesium 100 MG CAPS Take 100 mg by mouth.    [provider]  omeprazole (PRILOSEC) 40 MG capsule TAKE 1 CAPSULE BY MOUTH TWICE A DAY 04/20/18   Janith Lima, MD  ondansetron (ZOFRAN-ODT) 4 MG disintegrating tablet Take 1-2 tablets (4-8 mg total) by mouth every 8 (eight) hours as needed. 05/28/21   Melvenia Beam, MD  potassium chloride (KLOR-CON) 8 MEQ tablet Take 8 mEq by mouth daily.    [provider]  PROAIR HFA 108 (90 Base) MCG/ACT inhaler INHALE 1-2 PUFFS EVERY 4-6 HOURS AS NEEDED FOR PERSISTENT COUGH OR INCREASED WORK OF BREATHING 09/08/16   [provider]  rizatriptan (MAXALT-MLT) 10 MG disintegrating tablet Take 1 tablet (10 mg total) by mouth as needed for migraine. May repeat in 2 hours if needed 05/28/21   Melvenia Beam, MD  Ubrogepant (UBRELVY) 100 MG TABS Take 100 mg by mouth every 2 (two) hours as needed. Maximum '200mg'$  a day. 05/28/21   Melvenia Beam, MD  verapamil (VERELAN PM) 240 MG 24 hr capsule Take 1 capsule (240 mg total) by mouth daily. 05/28/21   Melvenia Beam, MD  Vitamin D, Ergocalciferol, (DRISDOL) 1.25 MG (50000 UNIT) CAPS capsule Take 1 capsule (50,000 Units total) by mouth every 7 (seven) days. 02/26/22   Laqueta Linden, MD    Family History Family History  Problem Relation Age of Onset   Depression Mother    Cancer Mother    Thyroid disease  Mother    Heart disease Mother    Hyperlipidemia Mother    Hypertension Mother    Asthma Mother    Clotting disorder Mother        Factor 5   Rheum arthritis Mother    Skin cancer Mother    Heart attack Mother    Migraines Mother    Kidney disease Mother    Anxiety disorder Mother    Bipolar disorder Mother    Obesity Mother    Obesity Father    Depression Father    Anxiety disorder Father    Hyperlipidemia Father    Hypertension Father    Diabetes Father        type 2    Liver disease Father    Sleep apnea Father  Emphysema Maternal Grandmother        smoker   Asthma Maternal Grandmother    Lung cancer Maternal Grandmother    Colon polyps Paternal Grandmother    Asthma Paternal Grandmother    Diabetes Paternal Grandmother        type 2   Clotting disorder Maternal Uncle        Factor 5   Allergies Other        whole family-both sides of family   Heart disease Other        MGM deceased with MI; both sides of grandparents    Social History Social History   Tobacco Use   Smoking status: Never   Smokeless tobacco: Never  Vaping Use   Vaping Use: Never used  Substance Use Topics   Alcohol use: No    Comment: socially   Drug use: No     Allergies   Citrus, Amoxicillin-pot clavulanate, Sulfamethoxazole-trimethoprim, and Vancomycin   Review of Systems Review of Systems  Constitutional:  Positive for chills and fever.  HENT:  Negative for ear pain and sore throat.   Respiratory:  Positive for cough. Negative for shortness of breath.   Cardiovascular:  Negative for chest pain and palpitations.  Gastrointestinal:  Positive for abdominal pain. Negative for constipation, diarrhea, nausea and vomiting.  Genitourinary:  Positive for dysuria and hematuria. Negative for flank pain, penile discharge and testicular pain.  Musculoskeletal:  Negative for arthralgias and back pain.  Skin:  Negative for color change and rash.  All other systems reviewed and are  negative.    Physical Exam Triage Vital Signs ED Triage Vitals  Enc Vitals Group     BP      Pulse      Resp      Temp      Temp src      SpO2      Weight      Height      Head Circumference      Peak Flow      Pain Score      Pain Loc      Pain Edu?      Excl. in [place]?    No data found.  Updated Vital Signs BP 130/77 (BP Location: Left Arm)   Pulse (!) 117   Temp (!) 101 F (38.3 C) (Oral)   Resp 20   SpO2 96%   Visual Acuity Right Eye Distance:   Left Eye Distance:   Bilateral Distance:    Right Eye Near:   Left Eye Near:    Bilateral Near:     Physical Exam Vitals and nursing note reviewed.  Constitutional:      General: He is not in acute distress.    Appearance: He is well-developed. He is obese. He is not ill-appearing.  HENT:     Mouth/Throat:     Mouth: Mucous membranes are moist.  Cardiovascular:     Rate and Rhythm: Regular rhythm. Tachycardia present.     Heart sounds: Normal heart sounds.  Pulmonary:     Effort: Pulmonary effort is normal. No respiratory distress.     Breath sounds: Normal breath sounds.  Abdominal:     General: Bowel sounds are normal.     Palpations: Abdomen is soft.     Tenderness: There is abdominal tenderness in the right lower quadrant. There is no right CVA tenderness, left CVA tenderness, guarding or rebound.     Comments: Mild tenderness to palpation  of RLQ.  No rebound or guarding. No CVAT.  History of appendectomy.   Musculoskeletal:     Cervical back: Neck supple.  Skin:    General: Skin is warm and dry.  Neurological:     Mental Status: He is alert.  Psychiatric:        Mood and Affect: Mood normal.        Behavior: Behavior normal.      UC Treatments / Results  Labs (all labs ordered are listed, but only abnormal results are displayed) Labs Reviewed  POCT URINALYSIS DIP (MANUAL ENTRY) - Abnormal; Notable for the following components:      Result Value   Blood, UA trace-intact (*)    pH, UA 8.5  (*)    Protein Ur, POC trace (*)    Leukocytes, UA Trace (*)    All other components within normal limits  URINE CULTURE  COVID-19, FLU A+B NAA  POCT RAPID STREP A (OFFICE)    EKG   Radiology No results found.  Procedures Procedures (including critical care time)  Medications Ordered in UC Medications  acetaminophen (TYLENOL) tablet 650 mg (650 mg Oral Given 03/24/22 1709)    Initial Impression / Assessment and Plan / UC Course  I have reviewed the triage vital signs and the nursing notes.  Pertinent labs & imaging results that were available during my care of the patient were reviewed by me and considered in my medical decision making (see chart for details).    Urinary frequency, fever, lower abdominal pain, shortness of breath, tachycardia, morbid obesity.  Patient remains tachycardic and febrile despite Tylenol given here.  His abdomen is mildly tender to palpation.  He experienced episode of shortness of breath this afternoon.  Based on the patient's symptoms and exam today, sending him to the ED for evaluation.  Patient is agreeable to this and feels stable to drive himself to the ED.  He declines EMS.    Final Clinical Impressions(s) / UC Diagnoses   Final diagnoses:  Urinary frequency  Fever, unspecified  Lower abdominal pain  Shortness of breath  Tachycardia  Morbid obesity [facility])   Discharge Instructions   None    ED Prescriptions   None    PDMP not reviewed this encounter.   Sharion Balloon, NP 03/24/22 1752

## 2022-03-24 NOTE — ED Provider Notes (Signed)
[place] EMERGENCY DEPT Provider Note   CSN: [id number] Arrival date & time: 03/24/22  1842     History  Chief Complaint  Patient presents with   Fever    Alexis [name] is a 36 y.o. male.  Patient presents to ER chief complaint of urinary frequency fevers feeling of dehydration.  Symptoms going on for 5 days.  Fever of 101 today.  He went to urgent care and was sent to the ER for further evaluation.  Denies vomiting denies diarrhea.  Has abdominal fullness sensation denies any back pain.       Home Medications Prior to Admission medications   Medication Sig Start Date End Date Taking? Authorizing Provider  cefpodoxime (VANTIN) 200 MG tablet Take 1 tablet (200 mg total) by mouth 2 (two) times daily for 7 days. 03/24/22 03/31/22 Yes Luna Fuse, MD  ondansetron (ZOFRAN-ODT) 4 MG disintegrating tablet Take 1 tablet (4 mg total) by mouth every 8 (eight) hours as needed for up to 10 doses for nausea or vomiting. 03/24/22  Yes Danicka Hourihan, Greggory Brandy, MD  ALPRAZolam Duanne Moron) 1 MG tablet Take 1 tablet (1 mg total) by mouth 2 (two) times daily as needed for anxiety. 02/02/18   Janith Lima, MD  azithromycin (ZITHROMAX Z-PAK) 250 MG tablet As directed 01/17/22   Hassell Done, Mary-Margaret, FNP  buPROPion (WELLBUTRIN XL) 150 MG 24 hr tablet Take 150 mg by mouth daily.    [provider]  cetirizine (ZYRTEC) 10 MG tablet 1 tablet    [provider]  EPINEPHrine 0.3 mg/0.3 mL IJ SOAJ injection Inject into the muscle once.    [provider]  Ferrous Sulfate (IRON) 325 (65 Fe) MG TABS Take 1 tablet (325 mg total) by mouth daily. 06/17/21   Gatha Mayer, MD  Fremanezumab-vfrm (AJOVY) 225 MG/1.5ML SOAJ Inject 225 mg into the skin every 30 (thirty) days. 05/28/21   Melvenia Beam, MD  furosemide (LASIX) 20 MG tablet Take 1 tablet (20 mg total) by mouth daily. Patient taking differently: Take 20 mg by mouth daily as needed. 06/02/18   Janith Lima, MD   irbesartan-hydrochlorothiazide (AVALIDE) 300-12.5 MG tablet TAKE 1 TABLET BY MOUTH EVERY DAY NEEDS APPT FOR REFILLS 11/24/18   Janith Lima, MD  Magnesium 100 MG CAPS Take 100 mg by mouth.    [provider]  omeprazole (PRILOSEC) 40 MG capsule TAKE 1 CAPSULE BY MOUTH TWICE A DAY 04/20/18   Janith Lima, MD  potassium chloride (KLOR-CON) 8 MEQ tablet Take 8 mEq by mouth daily.    [provider]  PROAIR HFA 108 (90 Base) MCG/ACT inhaler INHALE 1-2 PUFFS EVERY 4-6 HOURS AS NEEDED FOR PERSISTENT COUGH OR INCREASED WORK OF BREATHING 09/08/16   [provider]  rizatriptan (MAXALT-MLT) 10 MG disintegrating tablet Take 1 tablet (10 mg total) by mouth as needed for migraine. May repeat in 2 hours if needed 05/28/21   Melvenia Beam, MD  Ubrogepant (UBRELVY) 100 MG TABS Take 100 mg by mouth every 2 (two) hours as needed. Maximum '200mg'$  a day. 05/28/21   Melvenia Beam, MD  verapamil (VERELAN PM) 240 MG 24 hr capsule Take 1 capsule (240 mg total) by mouth daily. 05/28/21   Melvenia Beam, MD  Vitamin D, Ergocalciferol, (DRISDOL) 1.25 MG (50000 UNIT) CAPS capsule Take 1 capsule (50,000 Units total) by mouth every 7 (seven) days. 02/26/22   Laqueta Linden, MD      Allergies  Citrus, Amoxicillin-pot clavulanate, Sulfamethoxazole-trimethoprim, and Vancomycin    Review of Systems   Review of Systems  Constitutional:  Positive for fever.  HENT:  Negative for ear pain and sore throat.   Eyes:  Negative for pain.  Respiratory:  Negative for cough.   Cardiovascular:  Negative for chest pain.  Genitourinary:  Negative for flank pain.  Musculoskeletal:  Negative for back pain.  Skin:  Negative for color change and rash.  Neurological:  Negative for syncope.  All other systems reviewed and are negative.   Physical Exam Updated Vital Signs BP 109/68   Pulse 93   Temp 98.9 F (37.2 C) (Oral)   Resp 16   Ht '6\' 4"'$  (1.93 m)   Wt (!) 223.3 kg   SpO2 97%   BMI  59.92 kg/m  Physical Exam Constitutional:      Appearance: He is well-developed.  HENT:     Head: Normocephalic.     Nose: Nose normal.  Eyes:     Extraocular Movements: Extraocular movements intact.  Cardiovascular:     Rate and Rhythm: Normal rate.  Pulmonary:     Effort: Pulmonary effort is normal.  Abdominal:     Tenderness: There is abdominal tenderness.     Comments: Mild lower abdominal tenderness.  No guarding or rebound.  No CVA tenderness noted bilaterally.  Skin:    Coloration: Skin is not jaundiced.  Neurological:     General: No focal deficit present.     Mental Status: He is alert and oriented to person, place, and time. Mental status is at baseline.     ED Results / Procedures / Treatments   Labs (all labs ordered are listed, but only abnormal results are displayed) Labs Reviewed  LACTIC ACID, PLASMA - Abnormal; Notable for the following components:      Result Value   Lactic Acid, Venous 3.0 (*)    All other components within normal limits  COMPREHENSIVE METABOLIC PANEL - Abnormal; Notable for the following components:   Potassium 3.2 (*)    Chloride 97 (*)    All other components within normal limits  CBC WITH DIFFERENTIAL/PLATELET - Abnormal; Notable for the following components:   WBC 18.7 (*)    Hemoglobin 12.9 (*)    MCV 77.2 (*)    MCH 24.1 (*)    RDW 15.9 (*)    Neutro Abs 14.4 (*)    Monocytes Absolute 1.4 (*)    Abs Immature Granulocytes 0.46 (*)    All other components within normal limits  URINALYSIS, ROUTINE W REFLEX MICROSCOPIC - Abnormal; Notable for the following components:   Hgb urine dipstick TRACE (*)    Protein, ur TRACE (*)    Leukocytes,Ua MODERATE (*)    All other components within normal limits  RESP PANEL BY RT-PCR (FLU A&B, COVID) [id number]  URINE CULTURE  LACTIC ACID, PLASMA    EKG None  Radiology CT Abdomen Pelvis W Contrast  Result Date: 03/24/2022 CLINICAL DATA:  Right lower quadrant pain and fever. EXAM: CT  ABDOMEN AND PELVIS WITH CONTRAST TECHNIQUE: Multidetector CT imaging of the abdomen and pelvis was performed using the standard protocol following bolus administration of intravenous contrast. RADIATION DOSE REDUCTION: This exam was performed according to the departmental dose-optimization program which includes automated exposure control, adjustment of the mA and/or kV according to patient size and/or use of iterative reconstruction technique. CONTRAST:  12m OMNIPAQUE IOHEXOL 300 MG/ML  SOLN COMPARISON:  CT abdomen and pelvis 02/21/2021. FINDINGS: Lower chest:  No acute abnormality. Hepatobiliary: Gallstones are present. There is no biliary ductal dilatation. No focal liver lesions are seen. Pancreas: Unremarkable. No pancreatic ductal dilatation or surrounding inflammatory changes. Spleen: Normal in size without focal abnormality. Adrenals/Urinary Tract: Kidneys and adrenal glands are within normal limits. There is mild wall thickening of the bladder with surrounding inflammatory stranding and fluid. No bladder calculi are seen. Stomach/Bowel: Stomach is within normal limits. Appendix is within normal limits. No evidence of bowel wall thickening, distention, or inflammatory changes. Vascular/Lymphatic: No significant vascular findings are present. No enlarged abdominal or pelvic lymph nodes. Reproductive: Prostate is unremarkable. Other: Small fat containing left inguinal hernia. No ascites or free air. Musculoskeletal: No acute or significant osseous findings. IMPRESSION: 1. Bladder wall thickening with surrounding inflammatory stranding and fluid worrisome for cystitis. 2. Cholelithiasis. Electronically Signed   By: Ronney Asters M.D.   On: 03/24/2022 22:36   DG Chest 2 View  Result Date: 03/24/2022 CLINICAL DATA:  Shortness of breath with fever. EXAM: CHEST - 2 VIEW COMPARISON:  Chest x-ray 07/04/2021 FINDINGS: The heart size and mediastinal contours are within normal limits. Both lungs are clear. The  visualized skeletal structures are unremarkable. IMPRESSION: No active cardiopulmonary disease. Electronically Signed   By: Ronney Asters M.D.   On: 03/24/2022 19:24    Procedures Procedures    Medications Ordered in ED Medications  sodium chloride 0.9 % bolus 1,000 mL (0 mLs Intravenous Stopped 03/24/22 2142)  cefTRIAXone (ROCEPHIN) 1 g in sodium chloride 0.9 % 100 mL IVPB (0 g Intravenous Stopped 03/24/22 2117)  ondansetron (ZOFRAN) injection 4 mg (4 mg Intravenous Given 03/24/22 2046)  potassium chloride SA (KLOR-CON M) CR tablet 40 mEq (40 mEq Oral Given 03/24/22 2114)  iohexol (OMNIPAQUE) 300 MG/ML solution 100 mL (100 mLs Intravenous Contrast Given 03/24/22 2213)  sodium chloride 0.9 % bolus 1,000 mL (0 mLs Intravenous Stopped 03/24/22 2306)    ED Course/ Medical Decision Making/ A&P                           Medical Decision Making Amount and/or Complexity of Data Reviewed Labs: ordered. Radiology: ordered.  Risk Prescription drug management.   Cardiac monitoring showing sinus rhythm.  Review of outside records shows office visit Feb 26, 2022 for vitamin D deficiency.  Work-up today included labs White count of 18.7 chemistry shows potassium 3.2 lactic acid elevated 3.0.  Urine shows moderate leukocytes.  Given symptoms I am concerned about urinary tract infection with elevated lactic acid elevated white count.  CT of the pelvis pursued with no additional findings other than evidence of cystitis.  Chest x-ray unremarkable per radiologist.  Patient's vital signs appear improving with IV fluids.  Initial lactic was three-point 0 repeat lactic appears improved as well.  Given IV Rocephin here in the ER, prescription of cefpodoxime provided.  Advise close follow-up with primary care doctor group in 2 or 3 days, however I advised immediate return for persistent fevers pain worsening symptoms or any additional concerns.        Final Clinical Impression(s) / ED Diagnoses Final  diagnoses:  Acute cystitis without hematuria    Rx / DC Orders ED Discharge Orders          Ordered    cefpodoxime (VANTIN) 200 MG tablet  2 times daily        03/24/22 2338    ondansetron (ZOFRAN-ODT) 4 MG disintegrating tablet  Every 8 hours PRN  03/24/22 2339              Luna Fuse, MD 03/24/22 [phone]

## 2022-03-24 NOTE — Discharge Instructions (Signed)
Call your primary care doctor or specialist as discussed in the next 2-3 days.   Return immediately back to the ER if:  Your symptoms worsen within the next 12-24 hours. You develop new symptoms such as new fevers, persistent vomiting, new pain, shortness of breath, or new weakness or numbness, or if you have any other concerns.  

## 2022-03-24 NOTE — ED Triage Notes (Signed)
Patient here POV from UC.  Endorses Recent History of Frequent Urination and Fever for approximately 5 days. Symptoms have been progressively worsening.   Fever of 100.5 Today. Given Tylenol at UC at approximately 1700. Sent by UC today for Laboratory Studies.   NAD Noted during Triage. A&Ox4. GCS 15. Ambulatory.

## 2022-03-24 NOTE — ED Notes (Signed)
Pt verbalizes understanding of discharge instructions. Opportunity for questioning and answers were provided. Pt discharged from ED to home.   ? ?

## 2022-03-24 NOTE — ED Notes (Signed)
Patient is being discharged from the Urgent Care and sent to the Emergency Department via personal vehicle . Per Hall Busing, NP, patient is in need of higher level of care due to further evaluation. Patient is aware and verbalizes understanding of plan of care.  Vitals:   03/24/22 1708 03/24/22 1740  BP: 130/77   Pulse: (!) 135 (!) 117  Resp: 20   Temp: (!) 100.8 F (38.2 C) (!) 101 F (38.3 C)  SpO2: 98% 96%

## 2022-03-24 NOTE — ED Notes (Signed)
Lactate 3.0 read back and verified with Simona Huh.

## 2022-03-24 NOTE — ED Triage Notes (Signed)
Patient presents to Urgent Care with complaints of urinary freq x 6 days, cough, body aches since today. Fever once last thursday and again today. 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 He has had several negative tests. Not taking any meds.

## 2022-03-25 LAB — COVID-19, FLU A+B NAA
Influenza A, NAA: NOT DETECTED
Influenza B, NAA: NOT DETECTED
SARS-CoV-2, NAA: NOT DETECTED

## 2022-03-26 LAB — URINE CULTURE

## 2022-04-06 ENCOUNTER — Ambulatory Visit (INDEPENDENT_AMBULATORY_CARE_PROVIDER_SITE_OTHER): Payer: BC Managed Care – PPO | Admitting: Family Medicine

## 2022-04-06 ENCOUNTER — Encounter (INDEPENDENT_AMBULATORY_CARE_PROVIDER_SITE_OTHER): Payer: Self-pay | Admitting: Family Medicine

## 2022-04-06 VITALS — BP 134/86 | HR 87 | Temp 98.0°F | Ht 76.0 in | Wt >= 6400 oz

## 2022-04-06 DIAGNOSIS — E559 Vitamin D deficiency, unspecified: Secondary | ICD-10-CM

## 2022-04-06 DIAGNOSIS — E669 Obesity, unspecified: Secondary | ICD-10-CM | POA: Diagnosis not present

## 2022-04-06 DIAGNOSIS — Z6841 Body Mass Index (BMI) 40.0 and over, adult: Secondary | ICD-10-CM | POA: Diagnosis not present

## 2022-04-06 DIAGNOSIS — I10 Essential (primary) hypertension: Secondary | ICD-10-CM | POA: Diagnosis not present

## 2022-04-06 MED ORDER — VITAMIN D (ERGOCALCIFEROL) 1.25 MG (50000 UNIT) PO CAPS: 50000.0000 [IU] | ORAL_CAPSULE | ORAL | 0 refills | Status: DC

## 2022-04-08 NOTE — Progress Notes (Signed)
Chief Complaint:   OBESITY Alexis [name] is here to discuss his progress with his obesity treatment plan along with follow-up of his obesity related diagnoses. Alexis [name] is on the Category 4 Plan+ 300 and states he is following his eating plan approximately 70% of the time. Alexis [name] states he is walking 30-45 minutes 2-3 times per week.  Today's visit was #: 6 Starting weight: 477 lbs Starting date: 12/02/2021 Today's weight: 486 lbs Today's date: 04/06/2022 Total lbs lost to date: 0 lbs Total lbs lost since last in-office visit: 0  Interim History: Alexis [name] went away on vacation since last appointment and states he overate more than underate. He wants to stick to plan for the next few weeks. Wants to start again tomorrow. His birthday is coming up.  Subjective:   1. Vitamin D deficiency Alexis [name] is currently taking prescription Vit D 50,000 IU once a week. Denies any nausea, vomiting or muscle weakness. He notes fatigue.  2. Essential hypertension Alexis [name]'s blood pressure is well controlled today. Denies chest pain, chest pressure and headache.  Assessment/Plan:   1. Vitamin D deficiency We will refill Vit D 50,000 IU once a week for 1 month with 0 refills.  -Refill Vitamin D, Ergocalciferol, (DRISDOL) 1.25 MG (50000 UNIT) CAPS capsule; Take 1 capsule (50,000 Units total) by mouth every 7 (seven) days.  Dispense: 4 capsule; Refill: 0  2. Essential hypertension Alexis [name] will continue with current medications without changes in dose.  3. Obesity with current BMI of 59.2 Alexis [name] is currently in the action stage of change. As such, his goal is to continue with weight loss efforts. He has agreed to the Category 4 Plan +300.  Exercise goals: All adults should avoid inactivity. Some physical activity is better than none, and adults who participate in any amount of physical activity gain some health benefits.  Behavioral modification strategies: increasing lean protein intake, meal planning and  cooking strategies, and keeping healthy foods in the home.  Alexis [name] has agreed to follow-up with our clinic in 3 weeks. He was informed of the importance of frequent follow-up visits to maximize his success with intensive lifestyle modifications for his multiple health conditions.   Objective:   Blood pressure 134/86, pulse 87, temperature 98 F (36.7 C), height '6\' 4"'$  (1.93 m), weight (!) 486 lb (220.4 kg), SpO2 98 %. Body mass index is 59.16 kg/m.  General: Cooperative, alert, well developed, in no acute distress. HEENT: Conjunctivae and lids unremarkable. Cardiovascular: Regular rhythm.  Lungs: Normal work of breathing. Neurologic: No focal deficits.   Lab Results  Component Value Date   CREATININE 1.21 03/24/2022   BUN 8 03/24/2022   NA 135 03/24/2022   K 3.2 (L) 03/24/2022   CL 97 (L) 03/24/2022   CO2 27 03/24/2022   Lab Results  Component Value Date   ALT 18 03/24/2022   AST 16 03/24/2022   ALKPHOS 53 03/24/2022   BILITOT 0.9 03/24/2022   Lab Results  Component Value Date   HGBA1C 5.8 (H) 12/02/2021   HGBA1C 5.4 11/18/2018   HGBA1C 5.5 03/08/2007   Lab Results  Component Value Date   INSULIN 34.5 (H) 12/02/2021   Lab Results  Component Value Date   TSH 2.930 12/02/2021   Lab Results  Component Value Date   CHOL 177 12/02/2021   HDL 43 12/02/2021   LDLCALC 111 (H) 12/02/2021   TRIG 130 12/02/2021   CHOLHDL 4 02/02/2018   Lab Results  Component Value Date   VD25OH 17.7 (  L) 12/02/2021   Lab Results  Component Value Date   WBC 18.7 (H) 03/24/2022   HGB 12.9 (L) 03/24/2022   HCT 41.3 03/24/2022   MCV 77.2 (L) 03/24/2022   PLT 314 03/24/2022   Lab Results  Component Value Date   IRON 28 (L) 05/08/2021   TIBC 309 05/08/2021   FERRITIN 33.9 09/12/2021   Attestation Statements:   Reviewed by clinician on day of visit: allergies, medications, problem list, medical history, surgical history, family history, social history, and previous encounter  notes.  I, Elnora Morrison, RMA am acting as transcriptionist for Coralie Common, MD.  I have reviewed the above documentation for accuracy and completeness, and I agree with the above. - Coralie Common, MD

## 2022-04-23 ENCOUNTER — Ambulatory Visit (INDEPENDENT_AMBULATORY_CARE_PROVIDER_SITE_OTHER): Payer: BC Managed Care – PPO | Admitting: Family Medicine

## 2022-04-23 ENCOUNTER — Encounter (INDEPENDENT_AMBULATORY_CARE_PROVIDER_SITE_OTHER): Payer: Self-pay | Admitting: Family Medicine

## 2022-04-23 VITALS — BP 129/76 | HR 96 | Temp 98.7°F | Ht 76.0 in | Wt >= 6400 oz

## 2022-04-23 DIAGNOSIS — D2261 Melanocytic nevi of right upper limb, including shoulder: Secondary | ICD-10-CM | POA: Diagnosis not present

## 2022-04-23 DIAGNOSIS — E559 Vitamin D deficiency, unspecified: Secondary | ICD-10-CM | POA: Diagnosis not present

## 2022-04-23 DIAGNOSIS — R7303 Prediabetes: Secondary | ICD-10-CM | POA: Diagnosis not present

## 2022-04-23 DIAGNOSIS — Z6841 Body Mass Index (BMI) 40.0 and over, adult: Secondary | ICD-10-CM | POA: Diagnosis not present

## 2022-04-23 DIAGNOSIS — L723 Sebaceous cyst: Secondary | ICD-10-CM | POA: Diagnosis not present

## 2022-04-23 DIAGNOSIS — L821 Other seborrheic keratosis: Secondary | ICD-10-CM | POA: Diagnosis not present

## 2022-04-23 DIAGNOSIS — D225 Melanocytic nevi of trunk: Secondary | ICD-10-CM | POA: Diagnosis not present

## 2022-04-23 DIAGNOSIS — E669 Obesity, unspecified: Secondary | ICD-10-CM | POA: Diagnosis not present

## 2022-04-23 MED ORDER — METFORMIN HCL 500 MG PO TABS: 500.0000 mg | ORAL_TABLET | Freq: Every day | ORAL | 0 refills | Status: DC

## 2022-04-23 MED ORDER — VITAMIN D (ERGOCALCIFEROL) 1.25 MG (50000 UNIT) PO CAPS: 50000.0000 [IU] | ORAL_CAPSULE | ORAL | 0 refills | Status: DC

## 2022-04-28 ENCOUNTER — Emergency Department (HOSPITAL_BASED_OUTPATIENT_CLINIC_OR_DEPARTMENT_OTHER): Payer: BC Managed Care – PPO

## 2022-04-28 ENCOUNTER — Other Ambulatory Visit: Payer: Self-pay

## 2022-04-28 ENCOUNTER — Encounter (HOSPITAL_BASED_OUTPATIENT_CLINIC_OR_DEPARTMENT_OTHER): Payer: Self-pay

## 2022-04-28 ENCOUNTER — Emergency Department (HOSPITAL_BASED_OUTPATIENT_CLINIC_OR_DEPARTMENT_OTHER)
Admission: EM | Admit: 2022-04-28 | Discharge: 2022-04-29 | Disposition: A | Discharge status: Home / Self Care | Payer: BC Managed Care – PPO | Attending: Emergency Medicine | Admitting: Emergency Medicine

## 2022-04-28 DIAGNOSIS — R112 Nausea with vomiting, unspecified: Secondary | ICD-10-CM | POA: Insufficient documentation

## 2022-04-28 DIAGNOSIS — Z7984 Long term (current) use of oral hypoglycemic drugs: Secondary | ICD-10-CM | POA: Insufficient documentation

## 2022-04-28 DIAGNOSIS — R1013 Epigastric pain: Secondary | ICD-10-CM | POA: Insufficient documentation

## 2022-04-28 DIAGNOSIS — K8020 Calculus of gallbladder without cholecystitis without obstruction: Secondary | ICD-10-CM | POA: Diagnosis not present

## 2022-04-28 DIAGNOSIS — R109 Unspecified abdominal pain: Secondary | ICD-10-CM | POA: Diagnosis not present

## 2022-04-28 DIAGNOSIS — R1011 Right upper quadrant pain: Secondary | ICD-10-CM | POA: Diagnosis not present

## 2022-04-28 LAB — URINALYSIS, ROUTINE W REFLEX MICROSCOPIC
Bilirubin Urine: NEGATIVE
Glucose, UA: NEGATIVE mg/dL
Hgb urine dipstick: NEGATIVE
Ketones, ur: NEGATIVE mg/dL
Leukocytes,Ua: NEGATIVE
Nitrite: NEGATIVE
Protein, ur: 100 mg/dL — AB
Specific Gravity, Urine: 1.037 — ABNORMAL HIGH (ref 1.005–1.030)
pH: 6 (ref 5.0–8.0)

## 2022-04-28 LAB — CBC
HCT: 43.3 % (ref 39.0–52.0)
Hemoglobin: 13.7 g/dL (ref 13.0–17.0)
MCH: 24.2 pg — ABNORMAL LOW (ref 26.0–34.0)
MCHC: 31.6 g/dL (ref 30.0–36.0)
MCV: 76.4 fL — ABNORMAL LOW (ref 80.0–100.0)
Platelets: 273 10*3/uL (ref 150–400)
RBC: 5.67 MIL/uL (ref 4.22–5.81)
RDW: 16.2 % — ABNORMAL HIGH (ref 11.5–15.5)
WBC: 10.8 10*3/uL — ABNORMAL HIGH (ref 4.0–10.5)
nRBC: 0 % (ref 0.0–0.2)

## 2022-04-28 LAB — COMPREHENSIVE METABOLIC PANEL
ALT: 20 U/L (ref 0–44)
AST: 20 U/L (ref 15–41)
Albumin: 4.1 g/dL (ref 3.5–5.0)
Alkaline Phosphatase: 58 U/L (ref 38–126)
Anion gap: 9 (ref 5–15)
BUN: 14 mg/dL (ref 6–20)
CO2: 26 mmol/L (ref 22–32)
Calcium: 9.8 mg/dL (ref 8.9–10.3)
Chloride: 103 mmol/L (ref 98–111)
Creatinine, Ser: 1.04 mg/dL (ref 0.61–1.24)
GFR, Estimated: >60 mL/min (ref >60)
Glucose, Bld: 110 mg/dL — ABNORMAL HIGH (ref 70–99)
Potassium: 3.7 mmol/L (ref 3.5–5.1)
Sodium: 138 mmol/L (ref 135–145)
Total Bilirubin: 0.7 mg/dL (ref 0.3–1.2)
Total Protein: 7.4 g/dL (ref 6.5–8.1)

## 2022-04-28 LAB — LIPASE, BLOOD: Lipase: 47 U/L (ref 11–51)

## 2022-04-28 LAB — TROPONIN I (HIGH SENSITIVITY): Troponin I (High Sensitivity): 3 ng/L (ref <18)

## 2022-04-28 MED ORDER — FAMOTIDINE 20 MG PO TABS: 20.0000 mg | ORAL_TABLET | Freq: Two times a day (BID) | ORAL | 0 refills | Status: DC

## 2022-04-28 MED ORDER — ALUM & MAG HYDROXIDE-SIMETH 200-200-20 MG/5ML PO SUSP
30.0000 mL | Freq: Once | ORAL | Status: AC
  Administered 2022-04-28: 30 mL via ORAL
  Filled 2022-04-28: qty 30

## 2022-04-28 MED ORDER — IOHEXOL 300 MG/ML  SOLN
100.0000 mL | Freq: Once | INTRAMUSCULAR | Status: AC | PRN
  Administered 2022-04-28: 100 mL via INTRAVENOUS

## 2022-04-28 MED ORDER — DICYCLOMINE HCL 10 MG PO CAPS
10.0000 mg | ORAL_CAPSULE | Freq: Once | ORAL | Status: AC
  Administered 2022-04-28: 10 mg via ORAL
  Filled 2022-04-28: qty 1

## 2022-04-28 MED ORDER — SODIUM CHLORIDE 0.9 % IV BOLUS
1000.0000 mL | Freq: Once | INTRAVENOUS | Status: AC
  Administered 2022-04-28: 1000 mL via INTRAVENOUS

## 2022-04-28 MED ORDER — METOCLOPRAMIDE HCL 5 MG/ML IJ SOLN
10.0000 mg | Freq: Once | INTRAMUSCULAR | Status: AC
  Administered 2022-04-28: 10 mg via INTRAVENOUS
  Filled 2022-04-28: qty 2

## 2022-04-28 MED ORDER — MORPHINE SULFATE (PF) 4 MG/ML IV SOLN
4.0000 mg | Freq: Once | INTRAVENOUS | Status: AC
  Administered 2022-04-28: 4 mg via INTRAVENOUS
  Filled 2022-04-28: qty 1

## 2022-04-28 MED ORDER — HYDROCHLOROTHIAZIDE 25 MG PO TABS
25.0000 mg | ORAL_TABLET | Freq: Once | ORAL | Status: AC
  Administered 2022-04-28: 25 mg via ORAL
  Filled 2022-04-28: qty 1

## 2022-04-28 MED ORDER — ONDANSETRON HCL 4 MG/2ML IJ SOLN
4.0000 mg | Freq: Once | INTRAMUSCULAR | Status: AC
  Administered 2022-04-28: 4 mg via INTRAVENOUS
  Filled 2022-04-28: qty 2

## 2022-04-28 NOTE — Discharge Instructions (Addendum)
Please follow-up with your GI doctor.  If you no longer are established with Dr. Carlean Purl, I have attached an office that you may call to these papers.

## 2022-04-28 NOTE — ED Notes (Signed)
Charge RN called to waiting room to assess pt. Pt now c/o central chest pain. Pt brought to treatment room, new EKG obtained and given to the EDP.

## 2022-04-28 NOTE — ED Provider Notes (Signed)
[facility] Provider Note   CSN: [id number] Arrival date & time: 04/28/22  1907     History  Chief Complaint  Patient presents with   Abdominal Pain    Alexis [name] is a 36 y.o. male with a past medical history of morbid obesity, GERD and "gallbladder attacks" presenting today with acute onset abdominal pain.  He reports that around 530 he started to have some right upper quadrant discomfort.  This increased in severity and upon his arrival he began to vomit.  While in the waiting room he developed some associated chest pain that he described as sharp in the middle of his chest.  No new foods, recent travel or antibiotics.  No associated diarrhea.  History of appendectomy.  Was seen last month and had a CT abdomen pelvis with gallstones.   Abdominal Pain      Home Medications Prior to Admission medications   Medication Sig Start Date End Date Taking? Authorizing Provider  famotidine (PEPCID) 20 MG tablet Take 1 tablet (20 mg total) by mouth 2 (two) times daily. 04/28/22  Yes Kayda Allers A, PA-C  ALPRAZolam (XANAX) 1 MG tablet Take 1 tablet (1 mg total) by mouth 2 (two) times daily as needed for anxiety. 02/02/18   Janith Lima, MD  azithromycin (ZITHROMAX Z-PAK) 250 MG tablet As directed 01/17/22   Hassell Done, Mary-Margaret, FNP  buPROPion (WELLBUTRIN XL) 150 MG 24 hr tablet Take 150 mg by mouth daily.    [provider]  cetirizine (ZYRTEC) 10 MG tablet 1 tablet    [provider]  EPINEPHrine 0.3 mg/0.3 mL IJ SOAJ injection Inject into the muscle once.    [provider]  Ferrous Sulfate (IRON) 325 (65 Fe) MG TABS Take 1 tablet (325 mg total) by mouth daily. 06/17/21   Gatha Mayer, MD  Fremanezumab-vfrm (AJOVY) 225 MG/1.5ML SOAJ Inject 225 mg into the skin every 30 (thirty) days. 05/28/21   Melvenia Beam, MD  furosemide (LASIX) 20 MG tablet Take 1 tablet (20 mg total) by mouth daily. Patient taking differently: Take 20  mg by mouth daily as needed. 06/02/18   Janith Lima, MD  irbesartan-hydrochlorothiazide (AVALIDE) 300-12.5 MG tablet TAKE 1 TABLET BY MOUTH EVERY DAY NEEDS APPT FOR REFILLS 11/24/18   Janith Lima, MD  Magnesium 100 MG CAPS Take 100 mg by mouth.    [provider]  metFORMIN (GLUCOPHAGE) 500 MG tablet Take 1 tablet (500 mg total) by mouth daily with breakfast. 04/23/22   Laqueta Linden, MD  omeprazole (PRILOSEC) 40 MG capsule TAKE 1 CAPSULE BY MOUTH TWICE A DAY 04/20/18   Janith Lima, MD  ondansetron (ZOFRAN-ODT) 4 MG disintegrating tablet Take 1 tablet (4 mg total) by mouth every 8 (eight) hours as needed for up to 10 doses for nausea or vomiting. 03/24/22   Luna Fuse, MD  potassium chloride (KLOR-CON) 8 MEQ tablet Take 8 mEq by mouth daily.    [provider]  PROAIR HFA 108 (90 Base) MCG/ACT inhaler INHALE 1-2 PUFFS EVERY 4-6 HOURS AS NEEDED FOR PERSISTENT COUGH OR INCREASED WORK OF BREATHING 09/08/16   [provider]  rizatriptan (MAXALT-MLT) 10 MG disintegrating tablet Take 1 tablet (10 mg total) by mouth as needed for migraine. May repeat in 2 hours if needed 05/28/21   Melvenia Beam, MD  Ubrogepant (UBRELVY) 100 MG TABS Take 100 mg by mouth every 2 (two) hours as needed. Maximum '200mg'$  a day. 05/28/21  Melvenia Beam, MD  verapamil (VERELAN PM) 240 MG 24 hr capsule Take 1 capsule (240 mg total) by mouth daily. 05/28/21   Melvenia Beam, MD  Vitamin D, Ergocalciferol, (DRISDOL) 1.25 MG (50000 UNIT) CAPS capsule Take 1 capsule (50,000 Units total) by mouth every 7 (seven) days. 04/23/22   Laqueta Linden, MD      Allergies    Citrus, Amoxicillin-pot clavulanate, Sulfamethoxazole-trimethoprim, and Vancomycin    Review of Systems   Review of Systems  Gastrointestinal:  Positive for abdominal pain.    Physical Exam Updated Vital Signs BP (!) 156/98   Pulse 78   Temp 97.9 F (36.6 C)   Resp 14   Ht '6\' 4"'$  (1.93 m)   Wt (!) 220.9 kg    SpO2 100%   BMI 59.28 kg/m  Physical Exam Vitals and nursing note reviewed.  Constitutional:      General: He is not in acute distress.    Appearance: Normal appearance. He is ill-appearing.  HENT:     Head: Normocephalic and atraumatic.  Eyes:     General: No scleral icterus.    Conjunctiva/sclera: Conjunctivae normal.  Cardiovascular:     Rate and Rhythm: Normal rate and regular rhythm.  Pulmonary:     Effort: Pulmonary effort is normal. No respiratory distress.  Abdominal:     Palpations: Abdomen is soft.     Tenderness: There is abdominal tenderness in the right upper quadrant and epigastric area.     Hernia: No hernia is present.  Skin:    General: Skin is warm and dry.     Findings: No rash.  Neurological:     Mental Status: He is alert.  Psychiatric:        Mood and Affect: Mood normal.     ED Results / Procedures / Treatments   Labs (all labs ordered are listed, but only abnormal results are displayed) Labs Reviewed  COMPREHENSIVE METABOLIC PANEL - Abnormal; Notable for the following components:      Result Value   Glucose, Bld 110 (*)    All other components within normal limits  CBC - Abnormal; Notable for the following components:   WBC 10.8 (*)    MCV 76.4 (*)    MCH 24.2 (*)    RDW 16.2 (*)    All other components within normal limits  LIPASE, BLOOD  URINALYSIS, ROUTINE W REFLEX MICROSCOPIC  TROPONIN I (HIGH SENSITIVITY)    EKG EKG Interpretation  Date/Time:  Tuesday April 28 2022 20:17:51 EDT Ventricular Rate:  81 PR Interval:  144 QRS Duration: 115 QT Interval:  380 QTC Calculation: 442 R Axis:   -24 Text Interpretation: Sinus rhythm Nonspecific intraventricular conduction delay Low voltage, precordial leads Baseline wander in lead(s) V3 Confirmed by Malvin Johns [phone]) on 04/28/2022 9:42:00 PM  Radiology US Abdomen Limited RUQ (LIVER/GB)  Result Date: 04/28/2022 CLINICAL DATA:  Right upper quadrant pain, morbid obesity. EXAM:  ULTRASOUND ABDOMEN LIMITED RIGHT UPPER QUADRANT COMPARISON:  03/24/2022. FINDINGS: Gallbladder: No gallstones visualized. The gallbladder wall measures 2.7 mm which is within normal limits. No pericholecystic fluid. No sonographic Murphy sign noted by sonographer. Common bile duct: Not seen on exam. Liver: No focal lesion identified. Increased parenchymal echogenicity. Portal vein is not seen on exam. Other: No free fluid. Examination is limited due to patient's body habitus. IMPRESSION: 1. Limited evaluation due to patient's body habitus. 2. No cholelithiasis or acute cholecystitis. 3. Hepatic steatosis. 4. Nonvisualization of the common bile duct and  portal vein. Electronically Signed   By: Brett Fairy M.D.   On: 04/28/2022 21:10    Procedures Procedures   Medications Ordered in ED Medications  metoCLOPramide (REGLAN) injection 10 mg (has no administration in time range)  alum & mag hydroxide-simeth (MAALOX/MYLANTA) 200-200-20 MG/5ML suspension 30 mL (has no administration in time range)  dicyclomine (BENTYL) capsule 10 mg (has no administration in time range)  ondansetron (ZOFRAN) injection 4 mg (4 mg Intravenous Given 04/28/22 2032)  morphine (PF) 4 MG/ML injection 4 mg (4 mg Intravenous Given 04/28/22 2032)  sodium chloride 0.9 % bolus 1,000 mL ( Intravenous Infusion Verify 04/28/22 2145)    ED Course/ Medical Decision Making/ A&P                           Medical Decision Making Amount and/or Complexity of Data Reviewed Labs: ordered. Radiology: ordered.  Risk OTC drugs. Prescription drug management.   This patient presents to the ED for concern of right upper quadrant tenderness.  Differential includes but is not limited to cholelithiasis, cholecystitis, PUD, pancreatitis, gastritis, gastroenteritis.   This is not an exhaustive differential.    Past Medical History / Co-morbidities / Social History: Obesity and recently diagnosed gallstones   Additional history: Per chart  review, patient follows with Dr. Carlean Purl for GI concerns.  He was diagnosed with IBS when he was younger and recently had a colonoscopy with some polyps removed.  This was August 2022.   Physical Exam: Epigastric and right upper quadrant tenderness  Lab Tests: I ordered, and personally interpreted labs.  The pertinent results include:  WBC 10.8   Imaging Studies: I ordered and independently visualized and interpreted RUQ [place] which showed no abnormalities.  Somewhat limited exam secondary to body habitus. I agree with the radiologist interpretation.   Cardiac Monitoring:  The patient was maintained on a cardiac monitor.  My attending physician Dr. Tamera Punt viewed and interpreted the cardiac monitored which showed an underlying rhythm of: NSR   Medications: I ordered medication including morphine and zofran. Reevaluation of the patient after these medicines showed that the patient improved.   He reported that his symptoms are coming back so GI cocktail and Reglan ordered at this time.  On reevaluation, patient feels better after these medications.   Disposition: 36 year old male presenting today with acute onset abdominal pain, nausea and vomiting.  Ultrasound was negative however gallstones were noted on patient's CT scan a month ago so another CT scan has been ordered to reevaluate the area.  Patient signed out to Dr. Tamera Punt at shift change.  She will follow-up on patient's CT scan.  Suspect surgical consult if there are signs of cholecystitis or severely symptomatic gallstones.  If negative, patient will follow-up with GI outpatient after he passes p.o. challenge.    Final Clinical Impression(s) / ED Diagnoses Final diagnoses:  None    Rx / DC Orders ED Discharge Orders          Ordered    famotidine (PEPCID) 20 MG tablet  2 times daily        04/28/22 2137              Koralee Wedeking, Cecilio Asper, PA-C 04/28/22 2206    Malvin Johns, MD 05/02/22 1458

## 2022-04-28 NOTE — ED Triage Notes (Signed)
Patient here POV from Home.  Endorses RUQ ABD Pain that began today. Worsens in the Past Hour.   Moderate Nausea. No Emesis. No Diarrhea. No Constipation. No Known Fevers. No Urinary Symptoms.   Uncomfortable during Triage. A&Ox4. GCS 15. Ambulatory.

## 2022-04-28 NOTE — Progress Notes (Signed)
Chief Complaint:   OBESITY Alexis [name] is here to discuss his progress with his obesity treatment plan along with follow-up of his obesity related diagnoses. Alexis [name] is on the Category 4 Plan +300 and states he is following his eating plan approximately 50% of the time. Alexis [name] states he is swimming 40 minutes 3 times per week.  Today's visit was #: 7 Starting weight: 477 lbs Starting date: 12/02/2021 Today's weight: 487 lbs Today's date: 04/23/2022 Total lbs lost to date: 0 lbs Total lbs lost since last in-office visit: 0  Interim History: Alexis [name] celebrated his birthday since last appointment, it was a surprise party! Then celebrated 4th of July and then his boyfriend's birthday. Realizes he needs to eat earlier in the evening. He wants to get back on meal prepping and planning.  Subjective:   1. Prediabetes Alexis [name]'s last A1c was 5.8, insulin was 34.5. He is not currently on medication. Increase in carb cravings recently.  2. Vitamin D deficiency Alexis [name] is currently taking prescription Vit D 50,000 IU once a week. His last Vit D level of 17.7.  Assessment/Plan:   1. Prediabetes  START Metformin 500 mg by mouth daily. Will fill for 1 month with 0 refills.  -Start metFORMIN (GLUCOPHAGE) 500 MG tablet; Take 1 tablet (500 mg total) by mouth daily with breakfast.  Dispense: 30 tablet; Refill: 0  2. Vitamin D deficiency We will refill Vit D 50,000 IU once a week for 1 month with 0 refills.  -Refill Vitamin D, Ergocalciferol, (DRISDOL) 1.25 MG (50000 UNIT) CAPS capsule; Take 1 capsule (50,000 Units total) by mouth every 7 (seven) days.  Dispense: 4 capsule; Refill: 0  3. Obesity with current BMI of 59.3 Alexis [name] is currently in the action stage of change. As such, his goal is to continue with weight loss efforts. He has agreed to the Category 4 Plan +300.  Exercise goals: No exercise has been prescribed at this time.  Behavioral modification strategies: increasing lean protein intake,  meal planning and cooking strategies, keeping healthy foods in the home, and planning for success.  Alexis [name] has agreed to follow-up with our clinic in 3 weeks. He was informed of the importance of frequent follow-up visits to maximize his success with intensive lifestyle modifications for his multiple health conditions.   Objective:   Blood pressure 129/76, pulse 96, temperature 98.7 F (37.1 C), height '6\' 4"'$  (1.93 m), weight (!) 487 lb (220.9 kg), SpO2 96 %. Body mass index is 59.28 kg/m.  General: Cooperative, alert, well developed, in no acute distress. HEENT: Conjunctivae and lids unremarkable. Cardiovascular: Regular rhythm.  Lungs: Normal work of breathing. Neurologic: No focal deficits.   Lab Results  Component Value Date   CREATININE 1.21 03/24/2022   BUN 8 03/24/2022   NA 135 03/24/2022   K 3.2 (L) 03/24/2022   CL 97 (L) 03/24/2022   CO2 27 03/24/2022   Lab Results  Component Value Date   ALT 18 03/24/2022   AST 16 03/24/2022   ALKPHOS 53 03/24/2022   BILITOT 0.9 03/24/2022   Lab Results  Component Value Date   HGBA1C 5.8 (H) 12/02/2021   HGBA1C 5.4 11/18/2018   HGBA1C 5.5 03/08/2007   Lab Results  Component Value Date   INSULIN 34.5 (H) 12/02/2021   Lab Results  Component Value Date   TSH 2.930 12/02/2021   Lab Results  Component Value Date   CHOL 177 12/02/2021   HDL 43 12/02/2021   LDLCALC 111 (H) 12/02/2021  TRIG 130 12/02/2021   CHOLHDL 4 02/02/2018   Lab Results  Component Value Date   VD25OH 17.7 (L) 12/02/2021   Lab Results  Component Value Date   WBC 18.7 (H) 03/24/2022   HGB 12.9 (L) 03/24/2022   HCT 41.3 03/24/2022   MCV 77.2 (L) 03/24/2022   PLT 314 03/24/2022   Lab Results  Component Value Date   IRON 28 (L) 05/08/2021   TIBC 309 05/08/2021   FERRITIN 33.9 09/12/2021   Attestation Statements:   Reviewed by clinician on day of visit: allergies, medications, problem list, medical history, surgical history, family history,  social history, and previous encounter notes.  I, Elnora Morrison, RMA am acting as transcriptionist for Coralie Common, MD.  I have reviewed the above documentation for accuracy and completeness, and I agree with the above. - Coralie Common, MD

## 2022-04-29 LAB — TROPONIN I (HIGH SENSITIVITY): Troponin I (High Sensitivity): 3 ng/L (ref <18)

## 2022-04-29 MED ORDER — ONDANSETRON 4 MG PO TBDP: ORAL_TABLET | ORAL | 0 refills | Status: DC

## 2022-04-29 MED ORDER — HYDROCODONE-ACETAMINOPHEN 5-325 MG PO TABS: 1.0000 | ORAL_TABLET | ORAL | 0 refills | Status: DC | PRN

## 2022-04-29 NOTE — ED Notes (Signed)
Reviewed AVS/discharge instruction with patient. Time allotted for and all questions answered. Patient is agreeable for d/c and escorted to ed exit by staff.  

## 2022-05-01 ENCOUNTER — Telehealth: Payer: Self-pay

## 2022-05-01 NOTE — Telephone Encounter (Signed)
Left message for pt to call back  °

## 2022-05-01 NOTE — Telephone Encounter (Signed)
Pt stated that he recently went to the Hospital and they notified him to follow up with his GI Dr. Hyman Bible reviewed: Pt diagnosed with gallstones, recommended to follow up with [facility]: Pt stated that he wanted to Dr. Carlean Purl before he has a surgery: Pt was scheduled for 06/17/2022 at 9:30 with Dr. Carlean Purl: Pt made aware: Pt verbalized understanding with all questions answered.

## 2022-05-12 ENCOUNTER — Encounter (INDEPENDENT_AMBULATORY_CARE_PROVIDER_SITE_OTHER): Payer: Self-pay | Admitting: Family Medicine

## 2022-05-12 NOTE — Telephone Encounter (Signed)
Please advise 

## 2022-05-20 ENCOUNTER — Encounter (INDEPENDENT_AMBULATORY_CARE_PROVIDER_SITE_OTHER): Payer: Self-pay

## 2022-05-21 ENCOUNTER — Ambulatory Visit (INDEPENDENT_AMBULATORY_CARE_PROVIDER_SITE_OTHER): Payer: BC Managed Care – PPO | Admitting: Family Medicine

## 2022-05-21 ENCOUNTER — Encounter (INDEPENDENT_AMBULATORY_CARE_PROVIDER_SITE_OTHER): Payer: Self-pay | Admitting: Family Medicine

## 2022-05-21 VITALS — BP 125/80 | HR 86 | Temp 98.7°F | Ht 76.0 in | Wt >= 6400 oz

## 2022-05-21 DIAGNOSIS — Z6841 Body Mass Index (BMI) 40.0 and over, adult: Secondary | ICD-10-CM

## 2022-05-21 DIAGNOSIS — D508 Other iron deficiency anemias: Secondary | ICD-10-CM

## 2022-05-21 DIAGNOSIS — E559 Vitamin D deficiency, unspecified: Secondary | ICD-10-CM

## 2022-05-21 DIAGNOSIS — E669 Obesity, unspecified: Secondary | ICD-10-CM

## 2022-05-21 MED ORDER — VITAMIN D (ERGOCALCIFEROL) 1.25 MG (50000 UNIT) PO CAPS: 50000.0000 [IU] | ORAL_CAPSULE | ORAL | 0 refills | Status: DC

## 2022-05-28 ENCOUNTER — Other Ambulatory Visit (INDEPENDENT_AMBULATORY_CARE_PROVIDER_SITE_OTHER): Payer: Self-pay | Admitting: Family Medicine

## 2022-05-28 DIAGNOSIS — R7303 Prediabetes: Secondary | ICD-10-CM

## 2022-05-28 NOTE — Progress Notes (Signed)
Chief Complaint:   OBESITY Alexis [name] is here to discuss his progress with his obesity treatment plan along with follow-up of his obesity related diagnoses. Alexis [name] is on the Category 4 Plan +300 and states he is following his eating plan approximately 50-60% of the time. Alexis [name] states he is going to the pool 20-30 minutes 2-3 times per week.  Today's visit was #: 8 Starting weight: 477 lbs Starting date: 12/02/2021 Today's weight: 486 lbs Today's date: 05/21/2022 Total lbs lost to date: 0 lbs Total lbs lost since last in-office visit: 0  Interim History: Alexis [name] experienced an episode of severe abdominal pain and went to ED and was found to have gallstones. Been hesitant to start Metformin. He has been trying to stay away from greasy/fatty foods.   Subjective:   1. Vitamin D deficiency Alexis [name] is currently taking prescription Vit D 50,000 IU once a week. Denies any nausea, vomiting or muscle weakness.He notes fatigue.  2. Other iron deficiency anemia Alexis [name] is currently taking over the counter Iron but not consistent. His last MCV was of 76, RDW of 162.  Assessment/Plan:   1. Vitamin D deficiency We will refill Vit D 50,000 IU once a week for 1 month with 0 refills.  -Refill Vitamin D, Ergocalciferol, (DRISDOL) 1.25 MG (50000 UNIT) CAPS capsule; Take 1 capsule (50,000 Units total) by mouth every 7 (seven) days.  Dispense: 4 capsule; Refill: 0  2. Other iron deficiency anemia Alexis [name] encouraged to increase consistency of by mouth iron. Will repeat CBC in 3 months.  3. Obesity with current BMI of 59.2 Alexis [name] is currently in the action stage of change. As such, his goal is to continue with weight loss efforts. He has agreed to the Category 4 Plan +300.  Exercise goals: All adults should avoid inactivity. Some physical activity is better than none, and adults who participate in any amount of physical activity gain some health benefits.  Behavioral modification strategies: increasing  lean protein intake, meal planning and cooking strategies, keeping healthy foods in the home, and planning for success.  Alexis [name] has agreed to follow-up with our clinic in 3 weeks. He was informed of the importance of frequent follow-up visits to maximize his success with intensive lifestyle modifications for his multiple health conditions.   Objective:   Blood pressure 125/80, pulse 86, temperature 98.7 F (37.1 C), height '6\' 4"'$  (1.93 m), weight (!) 486 lb (220.4 kg), SpO2 96 %. Body mass index is 59.16 kg/m.  General: Cooperative, alert, well developed, in no acute distress. HEENT: Conjunctivae and lids unremarkable. Cardiovascular: Regular rhythm.  Lungs: Normal work of breathing. Neurologic: No focal deficits.   Lab Results  Component Value Date   CREATININE 1.04 04/28/2022   BUN 14 04/28/2022   NA 138 04/28/2022   K 3.7 04/28/2022   CL 103 04/28/2022   CO2 26 04/28/2022   Lab Results  Component Value Date   ALT 20 04/28/2022   AST 20 04/28/2022   ALKPHOS 58 04/28/2022   BILITOT 0.7 04/28/2022   Lab Results  Component Value Date   HGBA1C 5.8 (H) 12/02/2021   HGBA1C 5.4 11/18/2018   HGBA1C 5.5 03/08/2007   Lab Results  Component Value Date   INSULIN 34.5 (H) 12/02/2021   Lab Results  Component Value Date   TSH 2.930 12/02/2021   Lab Results  Component Value Date   CHOL 177 12/02/2021   HDL 43 12/02/2021   LDLCALC 111 (H) 12/02/2021   TRIG 130 12/02/2021  CHOLHDL 4 02/02/2018   Lab Results  Component Value Date   VD25OH 17.7 (L) 12/02/2021   Lab Results  Component Value Date   WBC 10.8 (H) 04/28/2022   HGB 13.7 04/28/2022   HCT 43.3 04/28/2022   MCV 76.4 (L) 04/28/2022   PLT 273 04/28/2022   Lab Results  Component Value Date   IRON 28 (L) 05/08/2021   TIBC 309 05/08/2021   FERRITIN 33.9 09/12/2021   Attestation Statements:   Reviewed by clinician on day of visit: allergies, medications, problem list, medical history, surgical history,  family history, social history, and previous encounter notes.  I, Elnora Morrison, RMA am acting as transcriptionist for Coralie Common, MD.  I have reviewed the above documentation for accuracy and completeness, and I agree with the above. - Coralie Common, MD

## 2022-06-08 DIAGNOSIS — Z6841 Body Mass Index (BMI) 40.0 and over, adult: Secondary | ICD-10-CM | POA: Diagnosis not present

## 2022-06-08 DIAGNOSIS — K8020 Calculus of gallbladder without cholecystitis without obstruction: Secondary | ICD-10-CM | POA: Diagnosis not present

## 2022-06-17 ENCOUNTER — Ambulatory Visit: Payer: BC Managed Care – PPO | Admitting: Internal Medicine

## 2022-06-17 NOTE — Progress Notes (Deleted)
Alexis [name] 36 y.o. [birth year] [id number]  Assessment & Plan:      Subjective:   Chief Complaint:  HPI 36 year old white man with severe obesity and history of iron deficiency anemia (negative EGD and colonoscopy) who presents with a recent diagnosis of cholelithiasis. CT abdomen pelvis 04/28/2022 (ED visit) IMPRESSION: 1. No acute intra-abdominal or pelvic pathology. 2. Cholelithiasis. 3. Fatty liver. Ultrasound 04/28/2022 IMPRESSION: 1. Limited evaluation due to patient's body habitus. 2. No cholelithiasis or acute cholecystitis. 3. Hepatic steatosis. 4. Nonvisualization of the common bile duct and portal vein.  Lab Results  Component Value Date   WBC 10.8 (H) 04/28/2022   HGB 13.7 04/28/2022   HCT 43.3 04/28/2022   MCV 76.4 (L) 04/28/2022   PLT 273 04/28/2022   Lab Results  Component Value Date   FERRITIN 33.9 09/12/2021   Lab Results  Component Value Date   ALT 20 04/28/2022   AST 20 04/28/2022   ALKPHOS 58 04/28/2022   BILITOT 0.7 04/28/2022     Patient saw Dr. Brantley Stage of [facility] surgery on June 08, 2022 for symptomatic cholelithiasis.  Given severe obesity weight loss surgery was recommended to be considered, they discussed Actigall and about potential for cholecystectomy also.  EGD for iron deficiency 06/02/2021 - Normal esophagus. - Multiple gastric polyps. Biopsied. - Normal examined duodenum.  Colonoscopy for iron deficiency 06/02/2021 - Nodular ileal mucosa. Biopsied. - The entire examined colon is normal on direct and retroflexion views. - Biopsies were taken with a cold forceps from the ascending colon, transverse colon, descending colon, sigmoid colon and rectum for evaluation of microscopic colitis. Paternal great grandmother and paternal uncle had 58 at advanced ages  FINAL MICROSCOPIC DIAGNOSIS:   A. GASTRIC, FUNDUS AND BODY, POLYPECTOMY:  - Fundic gland polyps.  - Warthin-Starry negative for Helicobacter pylori.  - No  intestinal metaplasia, adenomatous change or carcinoma.   B. ILEUM, TERMINAL, BIOPSY:  - Ileal mucosa with benign lymphoid aggregates.  - No active inflammation, granulomas or chronic changes.   C. COLON, RANDOM, BIOPSY:  - Unremarkable colonic mucosa.  - No microscopic colitis, active inflammation or chronic changes.    1) Stomach polyps are innocent and benign and do not need f/u - likely a result of chronic omeprazole use   2) Colon and ileum (small bowel) biopsies are normal - no inflammation - no inflammatory bowel disease   3) I recommend a capsule endoscopy of small bowel to further evaluate iron deficiency anemia and chronic diarrhea (needs to be done at the hospital-not yet done)   4) go ahead and schedule a follow-up visit with me for next avail (Oct or Nov) and I will update with capsule results before that appt also   5) he should take ferrous sulfate daily (325 mg OTC) but hold 3 days pre capsule   6) colonoscopy recall 10 years (has 2 elderly second + degree relatives w/ CRCA so no earlier exam needed given distant relatives and age   12) no EGD recall  Allergies  Allergen Reactions   Citrus Anaphylaxis and Other (See Comments)    Lemon   Amoxicillin-Pot Clavulanate Nausea Only   Sulfamethoxazole-Trimethoprim Other (See Comments)    Does not remember, long ago   Vancomycin Other (See Comments)    Mild red mans syndrome, infuse more slowly.   No outpatient medications have been marked as taking for the 06/17/22 encounter (Appointment) with Gatha Mayer, MD.   Past Medical History:  Diagnosis Date  Acid reflux    ADD 09/13/2007   ALLERGIC RHINITIS 06/10/2009   Anemia    Anxiety    ASTHMA, UNSPECIFIED, UNSPECIFIED STATUS 07/19/2008   Back pain    Bacterial sinusitis    Bilateral swelling of feet    C. difficile diarrhea    Dental abscess    caused sinusitis   DEPRESSIVE DISORDER 90/24/0973   DYSMETABOLIC SYNDROME 53/29/9242   Gallbladder problem     GERD 09/13/2007   Headache    HYPERLIPIDEMIA 10/10/2009   HYPERTENSION 09/13/2007   IBS 03/20/2009   IBS (irritable bowel syndrome)    Joint pain    Morbid obesity ([place]) 07/19/2008   Ocular migraine    Palpitations    Prediabetes    Skin cancer    SOB (shortness of breath)    Vitamin D deficiency    Past Surgical History:  Procedure Laterality Date   ANKLE SURGERY  10/12/2005   APPENDECTOMY  10/13/2003   BIOPSY  06/02/2021   Procedure: BIOPSY;  Surgeon: Gatha Mayer, MD;  Location: [facility] END[facility]COPY;  Service: Endoscopy;;  EGD and COLON   COLONOSCOPY WITH PROPOFOL N/A 06/02/2021   Procedure: COLONOSCOPY WITH PROPOFOL;  Surgeon: Gatha Mayer, MD;  Location: [facility] ENDOS[facility];  Service: Endoscopy;  Laterality: N/A;   ESOPHAGOGASTRODUODENOSCOPY (EGD) WITH PROPOFOL N/A 06/02/2021   Procedure: ESOPHAGOGASTRODUODENOSCOPY (EGD) WITH PROPOFOL;  Surgeon: Gatha Mayer, MD;  Location: [facility] END[facility];  Service: Endoscopy;  Laterality: N/A;   IR GENERIC HISTORICAL  07/23/2016   IR FLUORO GUIDED NEEDLE PLC ASPIRATION/INJECTION LOC 07/23/2016 Arne Cleveland, MD [facility]INTER[facility]   LASIK Bilateral 2018   SKIN CANCER EXCISION     teenager   TEE WITHOUT CARDIOVERSION N/A 04/15/2016   Procedure: TRANSESOPHAGEAL ECHOCARDIOGRAM (TEE);  Surgeon: Sanda Klein, MD;  Location: [facility] ENDOSCOPY;  Service: Cardiovascular;  Laterality: N/A;   Social History   Social History Narrative   Lives at home with parents.   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   Caffeine use: 3-4 cups daily.    family history includes Allergies in an other family member; Anxiety disorder in his father and mother; Asthma in his maternal grandmother, mother, and paternal grandmother; Bipolar disorder in his mother; Cancer in his mother; Clotting disorder in his maternal uncle and mother; Colon polyps in his paternal grandmother; Depression in his father and  mother; Diabetes in his father and paternal grandmother; Emphysema in his maternal grandmother; Heart attack in his mother; Heart disease in his mother and another family member; Hyperlipidemia in his father and mother; Hypertension in his father and mother; Kidney disease in his mother; Liver disease in his father; Lung cancer in his maternal grandmother; Migraines in his mother; Obesity in his father and mother; Rheum arthritis in his mother; Skin cancer in his mother; Sleep apnea in his father; Thyroid disease in his mother.   Review of Systems   Objective:   Physical Exam

## 2022-06-20 ENCOUNTER — Other Ambulatory Visit (INDEPENDENT_AMBULATORY_CARE_PROVIDER_SITE_OTHER): Payer: Self-pay | Admitting: Family Medicine

## 2022-06-20 DIAGNOSIS — R7303 Prediabetes: Secondary | ICD-10-CM

## 2022-06-22 ENCOUNTER — Telehealth (INDEPENDENT_AMBULATORY_CARE_PROVIDER_SITE_OTHER): Payer: BC Managed Care – PPO | Admitting: Family Medicine

## 2022-06-22 ENCOUNTER — Encounter (INDEPENDENT_AMBULATORY_CARE_PROVIDER_SITE_OTHER): Payer: Self-pay | Admitting: Family Medicine

## 2022-06-22 DIAGNOSIS — E669 Obesity, unspecified: Secondary | ICD-10-CM | POA: Diagnosis not present

## 2022-06-22 DIAGNOSIS — E559 Vitamin D deficiency, unspecified: Secondary | ICD-10-CM

## 2022-06-22 DIAGNOSIS — R7303 Prediabetes: Secondary | ICD-10-CM | POA: Diagnosis not present

## 2022-06-22 DIAGNOSIS — Z6841 Body Mass Index (BMI) 40.0 and over, adult: Secondary | ICD-10-CM

## 2022-06-22 DIAGNOSIS — K8020 Calculus of gallbladder without cholecystitis without obstruction: Secondary | ICD-10-CM

## 2022-06-22 MED ORDER — VITAMIN D (ERGOCALCIFEROL) 1.25 MG (50000 UNIT) PO CAPS: 50000.0000 [IU] | ORAL_CAPSULE | ORAL | 0 refills | Status: DC

## 2022-06-22 MED ORDER — METFORMIN HCL 500 MG PO TABS: 250.0000 mg | ORAL_TABLET | Freq: Every day | ORAL | 0 refills | Status: DC

## 2022-06-22 NOTE — Progress Notes (Signed)
TeleHealth Visit:  This visit was completed with telemedicine (audio/video) technology. Chao has verbally consented to this TeleHealth visit. The patient is located at home, the provider is located at home. The participants in this visit include the listed provider and patient. The visit was conducted today via MyChart video.  OBESITY Alexis [name] is here to discuss his progress with his obesity treatment plan along with follow-up of his obesity related diagnoses.   Today's visit was # 9 Starting weight: 477 lbs Starting date: 12/02/2021 Weight at last in office visit: 486 lbs on 05/21/22 Total weight loss: 0 lbs at last in office visit on 05/21/22. Today's reported weight:  No weight reported.  Nutrition Plan: the Category 4 Plan plus 300 calorie- 60-75% adherence  Interim History: Alexis [name] reports he is doing better about not skipping meals.  Reports when he skipped meals he tended to overeat later in the day.  He has cut out fried foods.  He is drinking sweet tea.  Reports his boyfriend does bring him treats such as ice cream sometimes.  Assessment/Plan:  1. Prediabetes Alexis [name] has a diagnosis of prediabetes based on his elevated HgA1c.  Last A1c was 5.8 on 12/02/2021. Medication(s): Prescribed metformin 500 mg but has had diarrhea with this. Lab Results  Component Value Date   HGBA1C 5.8 (H) 12/02/2021   Lab Results  Component Value Date   INSULIN 34.5 (H) 12/02/2021    Plan: Refill metformin 0.5 to 1 tablet (250-500 mg) with breakfast daily. He will take 1/2 pill (250 mg) of metformin daily with food.  Discontinue if this is not tolerated.   2. Vitamin D Deficiency Vitamin D is not at goal of 50.  Vitamin D is very low at 17.7 (12/02/2021). He is on weekly prescription Vitamin D 50,000 IU.  Lab Results  Component Value Date   VD25OH 17.7 (L) 12/02/2021    Plan: Refill prescription vitamin D 50,000 IU weekly.   3.  Calculus of gallbladder without cholecystitis without  obstruction Had ED visit on 04/28/2022 and was diagnosed with cholelithiasis. Had general surgery consultation with Dr. Erroll Luna on 06/08/2022.  Recommended that they wait and see if he has further gallbladder attacks.  He was started on ursodiol.  He is avoiding fatty foods. Dr. Brantley Stage also told him he would be a good candidate for bariatric surgery.  Alexis [name] was in the process for approval for surgery 10 years ago but then a coworker died from bariatric surgery which caused him to drop out of the approval process. He is considering bariatric surgery again.  Plan: Information was provided to sign up for the bariatric surgery seminar [facility] Surgery seminar). Patient was advised that bariatric surgery is a tool for weight loss and will not be successful long-term without significant dietary changes and exercise.   4. Obesity: Current BMI 59.18 [name] is currently in the action stage of change. As such, his goal is to continue with weight loss efforts.  He has agreed to the Category 4 Plan +300 cal.  Between now and his next visit he will work on: 1.  Eating 3 meals with protein per day. 2.  Cutting out sweet tea-he will have tea with artificial sweetener. 3.  Reducing simple carbs.  Exercise goals: No exercise has been prescribed at this time.  Behavioral modification strategies: increasing lean protein intake, decreasing simple carbohydrates, decreasing liquid calories, planning for success, and decreasing junk food.  Alexa has agreed to follow-up with our clinic in 3 weeks.  No orders of the defined types were placed in this encounter.   Medications Discontinued During This Encounter  Medication Reason   metFORMIN (GLUCOPHAGE) 500 MG tablet Reorder   Vitamin D, Ergocalciferol, (DRISDOL) 1.25 MG (50000 UNIT) CAPS capsule Reorder     Meds ordered this encounter  Medications   metFORMIN (GLUCOPHAGE) 500 MG tablet    Sig: Take 0.5-1 tablets (250-500 mg  total) by mouth daily with breakfast.    Dispense:  30 tablet    Refill:  0    Order Specific Question:   Supervising Provider    Answer:   Dennard Nip D [[id number]]   Vitamin D, Ergocalciferol, (DRISDOL) 1.25 MG (50000 UNIT) CAPS capsule    Sig: Take 1 capsule (50,000 Units total) by mouth every 7 (seven) days.    Dispense:  4 capsule    Refill:  0    Order Specific Question:   Supervising Provider    Answer:   Dennard Nip D [[id number]]      Objective:   VITALS: Per patient if applicable, see vitals. GENERAL: Alert and in no acute distress. CARDIOPULMONARY: No increased WOB. Speaking in clear sentences.  PSYCH: Pleasant and cooperative. Speech normal rate and rhythm. Affect is appropriate. Insight and judgement are appropriate. Attention is focused, linear, and appropriate.  NEURO: Oriented as arrived to appointment on time with no prompting.   Lab Results  Component Value Date   CREATININE 1.04 04/28/2022   BUN 14 04/28/2022   NA 138 04/28/2022   K 3.7 04/28/2022   CL 103 04/28/2022   CO2 26 04/28/2022   Lab Results  Component Value Date   ALT 20 04/28/2022   AST 20 04/28/2022   ALKPHOS 58 04/28/2022   BILITOT 0.7 04/28/2022   Lab Results  Component Value Date   HGBA1C 5.8 (H) 12/02/2021   HGBA1C 5.4 11/18/2018   HGBA1C 5.5 03/08/2007   Lab Results  Component Value Date   INSULIN 34.5 (H) 12/02/2021   Lab Results  Component Value Date   TSH 2.930 12/02/2021   Lab Results  Component Value Date   CHOL 177 12/02/2021   HDL 43 12/02/2021   LDLCALC 111 (H) 12/02/2021   TRIG 130 12/02/2021   CHOLHDL 4 02/02/2018   Lab Results  Component Value Date   WBC 10.8 (H) 04/28/2022   HGB 13.7 04/28/2022   HCT 43.3 04/28/2022   MCV 76.4 (L) 04/28/2022   PLT 273 04/28/2022   Lab Results  Component Value Date   IRON 28 (L) 05/08/2021   TIBC 309 05/08/2021   FERRITIN 33.9 09/12/2021   Lab Results  Component Value Date   VD25OH 17.7 (L) 12/02/2021     Attestation Statements:   Reviewed by clinician on day of visit: allergies, medications, problem list, medical history, surgical history, family history, social history, and previous encounter notes.

## 2022-07-15 ENCOUNTER — Other Ambulatory Visit: Payer: Self-pay | Admitting: Neurology

## 2022-07-15 DIAGNOSIS — G43709 Chronic migraine without aura, not intractable, without status migrainosus: Secondary | ICD-10-CM

## 2022-07-15 DIAGNOSIS — I10 Essential (primary) hypertension: Secondary | ICD-10-CM

## 2022-07-15 DIAGNOSIS — G43109 Migraine with aura, not intractable, without status migrainosus: Secondary | ICD-10-CM

## 2022-07-15 DIAGNOSIS — G44019 Episodic cluster headache, not intractable: Secondary | ICD-10-CM

## 2022-07-16 ENCOUNTER — Encounter (INDEPENDENT_AMBULATORY_CARE_PROVIDER_SITE_OTHER): Payer: Self-pay | Admitting: Family Medicine

## 2022-07-16 ENCOUNTER — Telehealth (INDEPENDENT_AMBULATORY_CARE_PROVIDER_SITE_OTHER): Payer: BC Managed Care – PPO | Admitting: Family Medicine

## 2022-07-16 DIAGNOSIS — E669 Obesity, unspecified: Secondary | ICD-10-CM

## 2022-07-16 DIAGNOSIS — R632 Polyphagia: Secondary | ICD-10-CM

## 2022-07-16 DIAGNOSIS — R7303 Prediabetes: Secondary | ICD-10-CM | POA: Diagnosis not present

## 2022-07-16 DIAGNOSIS — Z6841 Body Mass Index (BMI) 40.0 and over, adult: Secondary | ICD-10-CM

## 2022-07-16 DIAGNOSIS — E559 Vitamin D deficiency, unspecified: Secondary | ICD-10-CM

## 2022-07-16 MED ORDER — VITAMIN D (ERGOCALCIFEROL) 1.25 MG (50000 UNIT) PO CAPS: 50000.0000 [IU] | ORAL_CAPSULE | ORAL | 0 refills | Status: DC

## 2022-07-16 MED ORDER — METFORMIN HCL 500 MG PO TABS: 250.0000 mg | ORAL_TABLET | Freq: Every day | ORAL | 0 refills | Status: DC

## 2022-07-18 ENCOUNTER — Other Ambulatory Visit (INDEPENDENT_AMBULATORY_CARE_PROVIDER_SITE_OTHER): Payer: Self-pay | Admitting: Family Medicine

## 2022-07-18 DIAGNOSIS — R7303 Prediabetes: Secondary | ICD-10-CM

## 2022-07-20 NOTE — Progress Notes (Signed)
TeleHealth Visit:  Due to the COVID-19 pandemic, this visit was completed with telemedicine (audio/video) technology to reduce patient and provider exposure as well as to preserve personal protective equipment.   Alexis [name] has verbally consented to this TeleHealth visit. The patient is located at home, the provider is located at the [facility] office. The participants in this visit include the listed provider and patient. The visit was conducted today via Mychart Video.   Chief Complaint: OBESITY Alexis [name] is here to discuss his progress with his obesity treatment plan along with follow-up of his obesity related diagnoses. Alexis [name] is on the Category 4 Plan and states he is following his eating plan approximately 80% of the time. Alexis [name] states he is using treadmill 10 minutes 5 times per week.  Today's visit was #: 10 Starting weight: 477 lbs Starting date: 12/02/2021  Interim History: Mychart appointment again--last time Alexis [name] thought he was Covid positive. He is doing 10 min/a day on treadmill at work. Following 80% of meal plan. Found making and or preparing food to make it easier to follow plan.  Subjective:   1. Vitamin D deficiency Alexis [name] is currently taking prescription Vit D 50,000 IU once a week. Denies any nausea, vomiting or muscle weakness. He notes fatigue.  2. Prediabetes Alexis [name]'s issues on Metformin with occasional diarrhea.  3. Polyphagia Significant increase in carb intake and carb desires. No history of kidney stones. PDMP ( prescription drug monitoring program) checked with no concerns. Blood pressure within normal limits at almost all previous appointment.  Assessment/Plan:   1. Vitamin D deficiency We will refill Vit D 50,000 IU once a week for 1 month with 0 refills.  -Refill Vitamin D, Ergocalciferol, (DRISDOL) 1.25 MG (50000 UNIT) CAPS capsule; Take 1 capsule (50,000 Units total) by mouth every 7 (seven) days.  Dispense: 4 capsule; Refill: 0  2.  Prediabetes We will refill Metformin 50 mg by mouth daily for 1 month with 0 refills.  -Refill metFORMIN (GLUCOPHAGE) 500 MG tablet; Take 0.5-1 tablets (250-500 mg total) by mouth daily with breakfast.  Dispense: 30 tablet; Refill: 0  3. Polyphagia Alexis [name] is to come in for weight check and will sign controlled substance contract. Will discuss Qsymia (Lomaira/Topiramate) at next appointment.  He understands the risks and possible side effects of the above medications.  Due to insurance coverage as well as national drug supply shortages we discussed GLP-1s but opted for combination lomaira/ topiramate if he were to want to pursue medication.  4. Obesity with current BMI of 59.2 Alexis [name] is currently in the action stage of change. As such, his goal is to continue with weight loss efforts. He has agreed to the Category 4 Plan +300.   Exercise goals: No exercise has been prescribed at this time.  Behavioral modification strategies: increasing lean protein intake, meal planning and cooking strategies, and keeping healthy foods in the home.  Alexis [name] has agreed to follow-up with our clinic in 3 weeks. He was informed of the importance of frequent follow-up visits to maximize his success with intensive lifestyle modifications for his multiple health conditions.  Objective:   VITALS: Per patient if applicable, see vitals. GENERAL: Alert and in no acute distress. CARDIOPULMONARY: No increased WOB. Speaking in clear sentences.  PSYCH: Pleasant and cooperative. Speech normal rate and rhythm. Affect is appropriate. Insight and judgement are appropriate. Attention is focused, linear, and appropriate.  NEURO: Oriented as arrived to appointment on time with no prompting.   Lab Results  Component  Value Date   CREATININE 1.04 04/28/2022   BUN 14 04/28/2022   NA 138 04/28/2022   K 3.7 04/28/2022   CL 103 04/28/2022   CO2 26 04/28/2022   Lab Results  Component Value Date   ALT 20 04/28/2022   AST 20  04/28/2022   ALKPHOS 58 04/28/2022   BILITOT 0.7 04/28/2022   Lab Results  Component Value Date   HGBA1C 5.8 (H) 12/02/2021   HGBA1C 5.4 11/18/2018   HGBA1C 5.5 03/08/2007   Lab Results  Component Value Date   INSULIN 34.5 (H) 12/02/2021   Lab Results  Component Value Date   TSH 2.930 12/02/2021   Lab Results  Component Value Date   CHOL 177 12/02/2021   HDL 43 12/02/2021   LDLCALC 111 (H) 12/02/2021   TRIG 130 12/02/2021   CHOLHDL 4 02/02/2018   Lab Results  Component Value Date   VD25OH 17.7 (L) 12/02/2021   Lab Results  Component Value Date   WBC 10.8 (H) 04/28/2022   HGB 13.7 04/28/2022   HCT 43.3 04/28/2022   MCV 76.4 (L) 04/28/2022   PLT 273 04/28/2022   Lab Results  Component Value Date   IRON 28 (L) 05/08/2021   TIBC 309 05/08/2021   FERRITIN 33.9 09/12/2021    Attestation Statements:   Reviewed by clinician on day of visit: allergies, medications, problem list, medical history, surgical history, family history, social history, and previous encounter notes.  I, Elnora Morrison, RMA am acting as transcriptionist for Coralie Common, MD.  I have reviewed the above documentation for accuracy and completeness, and I agree with the above. - Coralie Common, MD

## 2022-08-04 NOTE — Progress Notes (Unsigned)
TeleHealth Visit:  This visit was completed with telemedicine (audio/video) technology. Alexis [name] has verbally consented to this TeleHealth visit. The patient is located at home, the provider is located at home. The participants in this visit include the listed provider and patient. The visit was conducted today via MyChart video.  OBESITY Alexis [name] is here to discuss his progress with his obesity treatment plan along with follow-up of his obesity related diagnoses.   Today's visit was # 11 Starting weight: 477 lbs Starting date: 12/02/2021 Weight at last in office visit: 486 lbs on 05/21/22 Total weight loss: 0 lbs at last in office visit on 05/21/22. Today's reported weight: No weight reported.  Nutrition Plan: the Category 4 Plan +300 calories  Current exercise:  treadmill 10 minutes 5 times per week.  Interim History: Alexis [name] reports he is doing better with cooking at home and skipping fewer meals.  His boyfriend does the cooking.  Breakfast is the meal he tends to skip due to lack of hunger and lack of time for prep since he is tends to sleep late.  He packs lunch or gets a salad at work and uses light [place] dressing.  Tends to cook dinner at home.  He has sweet tea at lunch a few times a week.  Has cut back on soda. He would like to try Qsymia as discussed at last visit and looks forward to talking to Dr. Jeani Sow about this.  He is going to try to go into the office and weigh tomorrow because he will be in [place].  Assessment/Plan:  1. Vitamin D Deficiency Vitamin D is at goal of 50.  Vitamin D very low at 17.7. He is on weekly prescription Vitamin D 50,000 IU.  Lab Results  Component Value Date   VD25OH 17.7 (L) 12/02/2021    Plan: Refill prescription vitamin D 50,000 IU weekly. Check vitamin D level within the next few months.  2. Prediabetes Last A1c elevated at 5.8 on 12/02/2021. Notes polyphagia and cravings.  Metformin started last office visit.  He had been  concerned because he was on this in the past and it caused GI upset.  He has been taking 250 mg (1/2 tablet) of metformin and tolerating it fine.  Lab Results  Component Value Date   HGBA1C 5.8 (H) 12/02/2021   Lab Results  Component Value Date   INSULIN 34.5 (H) 12/02/2021    Plan: Continue 250 mg of metformin daily.  May increase to 1 tablet/day. Refill metformin 250-500 mg daily.   3. Obesity: Current BMI 59.2 Alexis [name] is currently in the action stage of change. As such, his goal is to continue with weight loss efforts.  He has agreed to the Category 4 Plan +300 calories.   Exercise goals: as is  [employer] breakfast handout sent via [place]. Encouraged him to take skinny girl dressing to work to have on salad. Work on cutting out sugar sweetened beverages.  Behavioral modification strategies: increasing lean protein intake, decreasing simple carbohydrates, increasing water intake, decreasing liquid calories, no skipping meals, meal planning and cooking strategies, and planning for success.  Alexis [name] has agreed to follow-up with our clinic in 3 weeks.   No orders of the defined types were placed in this encounter.   Medications Discontinued During This Encounter  Medication Reason   metFORMIN (GLUCOPHAGE) 500 MG tablet Reorder   Vitamin D, Ergocalciferol, (DRISDOL) 1.25 MG (50000 UNIT) CAPS capsule Reorder     Meds ordered this encounter  Medications   metFORMIN (  GLUCOPHAGE) 500 MG tablet    Sig: Take 0.5-1 tablets (250-500 mg total) by mouth daily with breakfast.    Dispense:  30 tablet    Refill:  0    Order Specific Question:   Supervising Provider    Answer:   Netty Starring   Vitamin D, Ergocalciferol, (DRISDOL) 1.25 MG (50000 UNIT) CAPS capsule    Sig: Take 1 capsule (50,000 Units total) by mouth every 7 (seven) days.    Dispense:  4 capsule    Refill:  0    Order Specific Question:   Supervising Provider    Answer:   Dell Ponto [[id number]]       Objective:   VITALS: Per patient if applicable, see vitals. GENERAL: Alert and in no acute distress. CARDIOPULMONARY: No increased WOB. Speaking in clear sentences.  PSYCH: Pleasant and cooperative. Speech normal rate and rhythm. Affect is appropriate. Insight and judgement are appropriate. Attention is focused, linear, and appropriate.  NEURO: Oriented as arrived to appointment on time with no prompting.   Lab Results  Component Value Date   CREATININE 1.04 04/28/2022   BUN 14 04/28/2022   NA 138 04/28/2022   K 3.7 04/28/2022   CL 103 04/28/2022   CO2 26 04/28/2022   Lab Results  Component Value Date   ALT 20 04/28/2022   AST 20 04/28/2022   ALKPHOS 58 04/28/2022   BILITOT 0.7 04/28/2022   Lab Results  Component Value Date   HGBA1C 5.8 (H) 12/02/2021   HGBA1C 5.4 11/18/2018   HGBA1C 5.5 03/08/2007   Lab Results  Component Value Date   INSULIN 34.5 (H) 12/02/2021   Lab Results  Component Value Date   TSH 2.930 12/02/2021   Lab Results  Component Value Date   CHOL 177 12/02/2021   HDL 43 12/02/2021   LDLCALC 111 (H) 12/02/2021   TRIG 130 12/02/2021   CHOLHDL 4 02/02/2018   Lab Results  Component Value Date   WBC 10.8 (H) 04/28/2022   HGB 13.7 04/28/2022   HCT 43.3 04/28/2022   MCV 76.4 (L) 04/28/2022   PLT 273 04/28/2022   Lab Results  Component Value Date   IRON 28 (L) 05/08/2021   TIBC 309 05/08/2021   FERRITIN 33.9 09/12/2021   Lab Results  Component Value Date   VD25OH 17.7 (L) 12/02/2021    Attestation Statements:   Reviewed by clinician on day of visit: allergies, medications, problem list, medical history, surgical history, family history, social history, and previous encounter notes.

## 2022-08-05 ENCOUNTER — Encounter (INDEPENDENT_AMBULATORY_CARE_PROVIDER_SITE_OTHER): Payer: Self-pay | Admitting: Family Medicine

## 2022-08-05 ENCOUNTER — Telehealth (INDEPENDENT_AMBULATORY_CARE_PROVIDER_SITE_OTHER): Payer: BC Managed Care – PPO | Admitting: Family Medicine

## 2022-08-05 DIAGNOSIS — E559 Vitamin D deficiency, unspecified: Secondary | ICD-10-CM

## 2022-08-05 DIAGNOSIS — R7303 Prediabetes: Secondary | ICD-10-CM

## 2022-08-05 DIAGNOSIS — Z6841 Body Mass Index (BMI) 40.0 and over, adult: Secondary | ICD-10-CM | POA: Diagnosis not present

## 2022-08-05 DIAGNOSIS — E669 Obesity, unspecified: Secondary | ICD-10-CM

## 2022-08-05 MED ORDER — VITAMIN D (ERGOCALCIFEROL) 1.25 MG (50000 UNIT) PO CAPS
50000.0000 [IU] | ORAL_CAPSULE | ORAL | 0 refills | Status: DC
Start: 2022-08-05 — End: 2022-08-24

## 2022-08-05 MED ORDER — METFORMIN HCL 500 MG PO TABS: 250.0000 mg | ORAL_TABLET | Freq: Every day | ORAL | 0 refills | Status: DC

## 2022-08-10 ENCOUNTER — Telehealth: Payer: BC Managed Care – PPO | Admitting: Physician Assistant

## 2022-08-10 DIAGNOSIS — R509 Fever, unspecified: Secondary | ICD-10-CM

## 2022-08-10 DIAGNOSIS — R300 Dysuria: Secondary | ICD-10-CM

## 2022-08-10 NOTE — Progress Notes (Signed)
E-Visit for Urinary Problems ? ?Based on what you shared with me, I feel your condition warrants further evaluation and I recommend that you be seen for a face to face office visit.  Male bladder infections are not very common.  We worry about prostate or kidney conditions.  The standard of care is to examine the abdomen and kidneys, and to do a urine and blood test to make sure that something more serious is not going on.  We recommend that you see a provider today.  If your doctor's office is closed [facility] has the following Urgent Cares: ? ?  ?NOTE: You will not be charged for this e-visit. ? ?If you are having a true medical emergency please call 911.   ? ?  ? For an urgent face to face visit, [facility] has six urgent care centers for your convenience:  ?  ? [facility] [place] ?Get Driving Directions ?[phone] ?[address] ?, [place] [address] ?  ? [facility] ([place]) ?Get Driving Directions ?[phone] ?[address] ?[place], [place] [address] ? ?[facility] ([place]) ?Get Driving Directions ?[phone] ?[address] ?[place],  [place]  [address] ? ?[facility] ?Get Driving Directions ?[phone] ?[address] ?[place], [place] [address] ?  ?[facility] ?Get Driving Directions  ?[phone] ?[address] ?[address] ?[place], [place] [address] ?  ? [facility] [place] ?Get Driving Directions ?[phone] ?[address] ?[place], [place] [address] ? ?Your MyChart E-visit questionnaire answers were reviewed by a board certified advanced clinical practitioner to complete your personal care plan based on your specific symptoms.  Thank you for using e-Visits. ?

## 2022-08-12 DIAGNOSIS — K219 Gastro-esophageal reflux disease without esophagitis: Secondary | ICD-10-CM | POA: Diagnosis not present

## 2022-08-12 DIAGNOSIS — I10 Essential (primary) hypertension: Secondary | ICD-10-CM | POA: Diagnosis not present

## 2022-08-12 DIAGNOSIS — F419 Anxiety disorder, unspecified: Secondary | ICD-10-CM | POA: Diagnosis not present

## 2022-08-12 DIAGNOSIS — F324 Major depressive disorder, single episode, in partial remission: Secondary | ICD-10-CM | POA: Diagnosis not present

## 2022-08-13 ENCOUNTER — Other Ambulatory Visit (INDEPENDENT_AMBULATORY_CARE_PROVIDER_SITE_OTHER): Payer: Self-pay | Admitting: Family Medicine

## 2022-08-13 DIAGNOSIS — R7303 Prediabetes: Secondary | ICD-10-CM

## 2022-08-16 ENCOUNTER — Other Ambulatory Visit: Payer: Self-pay | Admitting: Neurology

## 2022-08-16 DIAGNOSIS — I10 Essential (primary) hypertension: Secondary | ICD-10-CM

## 2022-08-16 DIAGNOSIS — G44019 Episodic cluster headache, not intractable: Secondary | ICD-10-CM

## 2022-08-16 DIAGNOSIS — G43109 Migraine with aura, not intractable, without status migrainosus: Secondary | ICD-10-CM

## 2022-08-16 DIAGNOSIS — G43709 Chronic migraine without aura, not intractable, without status migrainosus: Secondary | ICD-10-CM

## 2022-08-24 ENCOUNTER — Ambulatory Visit (INDEPENDENT_AMBULATORY_CARE_PROVIDER_SITE_OTHER): Payer: BC Managed Care – PPO | Admitting: Family Medicine

## 2022-08-24 VITALS — BP 118/82 | HR 82 | Temp 98.4°F | Ht 76.0 in | Wt >= 6400 oz

## 2022-08-24 DIAGNOSIS — E559 Vitamin D deficiency, unspecified: Secondary | ICD-10-CM | POA: Diagnosis not present

## 2022-08-24 DIAGNOSIS — R632 Polyphagia: Secondary | ICD-10-CM

## 2022-08-24 DIAGNOSIS — Z6841 Body Mass Index (BMI) 40.0 and over, adult: Secondary | ICD-10-CM

## 2022-08-24 DIAGNOSIS — E669 Obesity, unspecified: Secondary | ICD-10-CM

## 2022-08-24 DIAGNOSIS — R7303 Prediabetes: Secondary | ICD-10-CM | POA: Diagnosis not present

## 2022-08-24 MED ORDER — VITAMIN D (ERGOCALCIFEROL) 1.25 MG (50000 UNIT) PO CAPS: 50000.0000 [IU] | ORAL_CAPSULE | ORAL | 0 refills | Status: DC

## 2022-08-24 MED ORDER — METFORMIN HCL 500 MG PO TABS: 500.0000 mg | ORAL_TABLET | Freq: Every day | ORAL | 0 refills | Status: DC

## 2022-08-24 MED ORDER — LOMAIRA 8 MG PO TABS: 8.0000 mg | ORAL_TABLET | Freq: Every day | ORAL | 0 refills | Status: DC

## 2022-08-24 MED ORDER — TOPIRAMATE 25 MG PO TABS: 25.0000 mg | ORAL_TABLET | Freq: Two times a day (BID) | ORAL | 0 refills | Status: DC

## 2022-08-25 ENCOUNTER — Encounter (INDEPENDENT_AMBULATORY_CARE_PROVIDER_SITE_OTHER): Payer: Self-pay | Admitting: Family Medicine

## 2022-09-08 NOTE — Progress Notes (Signed)
Chief Complaint:   OBESITY Alexis [name] is here to discuss his progress with his obesity treatment plan along with follow-up of his obesity related diagnoses. Alexis [name] is on the Category 4 Plan and states he is following his eating plan approximately 90% of the time. Alexis [name] states he is using treadmill and walking 15 minutes 1-4 times per week.  Today's visit was #: 12 Starting weight: 477 lbs Starting date: 12/02/2021 Today's weight: 469 lbs Today's date: 08/24/2022 Total lbs lost to date: 8 lbs Total lbs lost since last in-office visit: 17  Interim History: Alexis [name] seen for 1st time in clinic since 05/21/22. Has been adhering to meal plan more strictly and has been more conscious of choices. His partner is also making healthier choices. Not working on Thanksgiving so going to parents.  Subjective:   1. Polyphagia Alexis [name]'s PDMP (A prescription drug monitoring program)checked with no issues. No concerns for nephrolithiasis. Controlled substance contract signed.  2. Prediabetes Alexis [name] is taking 1 tab, Metformin daily. Denies GI side effects.  3. Vitamin D deficiency Alexis [name] is doing well on Vit D. Denies any nausea, vomiting or muscle weakness. He notes fatigue. His last Vit D level of 17.7 then up to 33.5, 06/30/22.  Assessment/Plan:   1. Polyphagia Start Lomaira 8 mg daily AND start Topamax 25 mg twice a day for 1 month with 0 refills.  -Start Phentermine HCl (LOMAIRA) 8 MG TABS; Take 8 mg by mouth daily.  Dispense: 30 tablet; Refill: 0  -Start topiramate (TOPAMAX) 25 MG tablet; Take 1 tablet (25 mg total) by mouth 2 (two) times daily.  Dispense: 60 tablet; Refill: 0  2. Prediabetes We will refill Metformin 500 mg daily for 1 month with 0 refills.  -Refill metFORMIN (GLUCOPHAGE) 500 MG tablet; Take 1 tablet (500 mg total) by mouth daily with breakfast.  Dispense: 30 tablet; Refill: 0  3. Vitamin D deficiency We will refill Vit D 50K IU once a week for 1 month with 0 refills. Will  recheck labs in Feb 2024.  -Refill Vitamin D, Ergocalciferol, (DRISDOL) 1.25 MG (50000 UNIT) CAPS capsule; Take 1 capsule (50,000 Units total) by mouth every 7 (seven) days.  Dispense: 4 capsule; Refill: 0  4. Obesity with current BMI of 57.1 Alexis [name] is currently in the action stage of change. As such, his goal is to continue with weight loss efforts. He has agreed to the Category 4 Plan+300.  Exercise goals: As is.  Behavioral modification strategies: increasing lean protein intake, meal planning and cooking strategies, keeping healthy foods in the home, and holiday eating strategies .  Alexis [name] has agreed to follow-up with our clinic in 5 weeks. He was informed of the importance of frequent follow-up visits to maximize his success with intensive lifestyle modifications for his multiple health conditions.   Objective:   Blood pressure 118/82, pulse 82, temperature 98.4 F (36.9 C), height '6\' 4"'$  (1.93 m), weight (!) 469 lb (212.7 kg), SpO2 99 %. Body mass index is 57.09 kg/m.  General: Cooperative, alert, well developed, in no acute distress. HEENT: Conjunctivae and lids unremarkable. Cardiovascular: Regular rhythm.  Lungs: Normal work of breathing. Neurologic: No focal deficits.   Lab Results  Component Value Date   CREATININE 1.04 04/28/2022   BUN 14 04/28/2022   NA 138 04/28/2022   K 3.7 04/28/2022   CL 103 04/28/2022   CO2 26 04/28/2022   Lab Results  Component Value Date   ALT 20 04/28/2022   AST 20 04/28/2022  ALKPHOS 58 04/28/2022   BILITOT 0.7 04/28/2022   Lab Results  Component Value Date   HGBA1C 5.8 (H) 12/02/2021   HGBA1C 5.4 11/18/2018   HGBA1C 5.5 03/08/2007   Lab Results  Component Value Date   INSULIN 34.5 (H) 12/02/2021   Lab Results  Component Value Date   TSH 2.930 12/02/2021   Lab Results  Component Value Date   CHOL 177 12/02/2021   HDL 43 12/02/2021   LDLCALC 111 (H) 12/02/2021   TRIG 130 12/02/2021   CHOLHDL 4 02/02/2018   Lab  Results  Component Value Date   VD25OH 17.7 (L) 12/02/2021   Lab Results  Component Value Date   WBC 10.8 (H) 04/28/2022   HGB 13.7 04/28/2022   HCT 43.3 04/28/2022   MCV 76.4 (L) 04/28/2022   PLT 273 04/28/2022   Lab Results  Component Value Date   IRON 28 (L) 05/08/2021   TIBC 309 05/08/2021   FERRITIN 33.9 09/12/2021   Attestation Statements:   Reviewed by clinician on day of visit: allergies, medications, problem list, medical history, surgical history, family history, social history, and previous encounter notes.  I, Elnora Morrison, RMA am acting as transcriptionist for Coralie Common, MD.  I have reviewed the above documentation for accuracy and completeness, and I agree with the above. - Coralie Common, MD

## 2022-09-17 ENCOUNTER — Ambulatory Visit (INDEPENDENT_AMBULATORY_CARE_PROVIDER_SITE_OTHER): Payer: BC Managed Care – PPO | Admitting: Internal Medicine

## 2022-09-19 ENCOUNTER — Other Ambulatory Visit: Payer: Self-pay | Admitting: Neurology

## 2022-09-19 DIAGNOSIS — G43109 Migraine with aura, not intractable, without status migrainosus: Secondary | ICD-10-CM

## 2022-09-19 DIAGNOSIS — G44019 Episodic cluster headache, not intractable: Secondary | ICD-10-CM

## 2022-09-19 DIAGNOSIS — I10 Essential (primary) hypertension: Secondary | ICD-10-CM

## 2022-09-19 DIAGNOSIS — G43709 Chronic migraine without aura, not intractable, without status migrainosus: Secondary | ICD-10-CM

## 2022-09-23 ENCOUNTER — Ambulatory Visit: Payer: BC Managed Care – PPO | Admitting: Neurology

## 2022-09-23 VITALS — BP 153/104 | HR 96 | Ht 78.0 in | Wt >= 6400 oz

## 2022-09-23 DIAGNOSIS — I10 Essential (primary) hypertension: Secondary | ICD-10-CM

## 2022-09-23 DIAGNOSIS — G44019 Episodic cluster headache, not intractable: Secondary | ICD-10-CM

## 2022-09-23 DIAGNOSIS — G43709 Chronic migraine without aura, not intractable, without status migrainosus: Secondary | ICD-10-CM | POA: Diagnosis not present

## 2022-09-23 MED ORDER — EMGALITY 120 MG/ML ~~LOC~~ SOAJ: 120.0000 mg | SUBCUTANEOUS | 11 refills | Status: DC

## 2022-09-23 MED ORDER — RIZATRIPTAN BENZOATE 10 MG PO TBDP: 10.0000 mg | ORAL_TABLET | ORAL | 11 refills | Status: DC | PRN

## 2022-09-23 MED ORDER — ONDANSETRON 4 MG PO TBDP: 4.0000 mg | ORAL_TABLET | Freq: Three times a day (TID) | ORAL | 3 refills | Status: AC | PRN

## 2022-09-23 NOTE — Patient Instructions (Addendum)
- Migraine and cluster Prevention: Recommend titrating up the Topiramate - Dr. Rinaldo Cloud - Cluster headache prevention: Continue the Verapamil - '240mg'$  could increase to '360mg'$  but fearful of hypotension, this is a good medication for cluster headaches and also for blood pressure - Acute: Rizatriptan as soon as the headache starts, can take with ondansetron and repeat 2 hours later. Could also try imitrex injections or nasal spray. See how the rizatriptan does and let us know if you need something faster.  Chronic migaines: 8-12 migraines a month and > 15 headache days a month for the last year. Doesn't feel the Ajovy helped. Botox is an option. Try emgality.  Galcanezumab Injection What is this medication? GALCANEZUMAB (gal ka NEZ ue mab) prevents migraines. It works by blocking a substance in the body that causes migraines. It may also be used to treat cluster headaches. It is a monoclonal antibody. This medicine may be used for other purposes; ask your health care provider or pharmacist if you have questions. COMMON BRAND NAME(S): Emgality What should I tell my care team before I take this medication? They need to know if you have any of these conditions: An unusual or allergic reaction to galcanezumab, other medications, foods, dyes, or preservatives Pregnant or trying to get pregnant Breast-feeding How should I use this medication? This medication is injected under the skin. You will be taught how to prepare and give it. Take it as directed on the prescription label. Keep taking it unless your care team tells you to stop. It is important that you put your used needles and syringes in a special sharps container. Do not put them in a trash can. If you do not have a sharps container, call your pharmacist or care team to get one. Talk to your care team about the use of this medication in children. Special care may be needed. Overdosage: If you think you have taken too much of this medicine contact a  poison control center or emergency room at once. NOTE: This medicine is only for you. Do not share this medicine with others. What if I miss a dose? If you miss a dose, take it as soon as you can. If it is almost time for your next dose, take only that dose. Do not take double or extra doses. What may interact with this medication? Interactions are not expected. This list may not describe all possible interactions. Give your health care provider a list of all the medicines, herbs, non-prescription drugs, or dietary supplements you use. Also tell them if you smoke, drink alcohol, or use illegal drugs. Some items may interact with your medicine. What should I watch for while using this medication? Visit your care team for regular checks on your progress. Tell your care team if your symptoms do not start to get better or if they get worse. What side effects may I notice from receiving this medication? Side effects that you should report to your care team as soon as possible: Allergic reactions or angioedema--skin rash, itching or hives, swelling of the face, eyes, lips, tongue, arms, or legs, trouble swallowing or breathing Side effects that usually do not require medical attention (report to your care team if they continue or are bothersome): Pain, redness, or irritation at injection site This list may not describe all possible side effects. Call your doctor for medical advice about side effects. You may report side effects to FDA at 1-800-FDA-1088. Where should I keep my medication? Keep out of the reach of  children and pets. Store in a refrigerator or at room temperature between 20 and 25 degrees C (68 and 77 degrees F). Refrigeration (preferred): Store in the refrigerator. Do not freeze. Keep in the original container until you are ready to take it. Remove the dose from the carton about 30 minutes before it is time for you to use it. If the dose is not used, it may be stored in original container  at room temperature for 7 days. Get rid of any unused medication after the expiration date. Room Temperature: This medication may be stored at room temperature for up to 7 days. Keep it in the original container. Protect from light until time of use. If it is stored at room temperature, get rid of any unused medication after 7 days or after it expires, whichever is first. To get rid of medications that are no longer needed or have expired: Take the medication to a medication take-back program. Check with your pharmacy or law enforcement to find a location. If you cannot return the medication, ask your pharmacist or care team how to get rid of this medication safely. NOTE: This sheet is a summary. It may not cover all possible information. If you have questions about this medicine, talk to your doctor, pharmacist, or health care provider.  2023 Elsevier/Gold Standard (2021-10-16 00:00:00)

## 2022-09-23 NOTE — Progress Notes (Signed)
[name]    Provider:  Dr Jaynee Eagles Referring Provider: Marda Stalker, PA-C Primary Care Physician:  Marda Stalker, PA-C   CC:  Headache   09/23/2022: He now does primary care for Alexis [name], Alexis [name]. He took Ajovy and couldn;t tell if it helped. Discussed Emgality is approved for migraines and cluster headaches. He was just started on Topiramate again and Dr. Jearld Shines can titrate or we can. He tried Ajovy. He has migraines and cluster headaches. He has light sensitivity, pulsating/pounding/throbbing, nausea, more on the right, advil helps but doesn't take it away, can abort in an hour without medicatio can last upwards of 24-48 hours, sleep helps. 8-12 migraines a month and > 15 headache days a month for the last year. Doesn't feel the Ajovy helped. Botox is an option. Try emgality.  Patient complains of symptoms per HPI as well as the following symptoms: migraines . Pertinent negatives and positives per HPI. All others negative   HPI 05/28/2021: His headaches get worse with stress, he has changed jobs, he had a sleep study several years and was borderline, headaches not predominantly in the mornng, more at the end of the day, clusters have not really come back, still on the verapamil, stopped the ajovy but that helped tremendously and when he was on that he would have 1-2 migraines a month that would be very manageable. Without the ajovy he is taking advil. He can have 7-8/10 in pain and 15 migraine days a month for over 6 months since stopping the Ajovy we will reinitiate Ajovy. Nurtec did not work.   HPI 10/16/2019: This is a patient referred from Dr. Ronnald Ramp for migraines and cluster headaches.  I have not seen him for over 3 years.  He has a long history of migraines.  Work-up in the past included MRI of the brain, MRV, lumbar puncture for evaluation of intracranial idiopathic hypertension or other causes of headache and was negative.  He has a family history  of migraines in his mother.  In the past he was on Topamax but since last I saw him he is not on topamax. 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 He was doing well until this year only having one migraine a month. Over the last 3 months since a sinus infection he is having more headaches, only on the left side, severe pain, daily, he had tearing of the eye, severe jabs, can last up to an hour, a few times a day, he wakes up with it.  They can happen several times a day.  They are severe, he paces, he is tried multiple acute management medications such as over-the-counter Tylenol, Benadryl, ibuprofen and this does not help.  He is not sure of the sinus infection had anything to do with precipitating this.  He has had these in the past, last time was several years ago, very consistent with cluster headaches.  We discussed the difference between cluster headaches and migraines.  I do feel as though he has both and there is higher incidence and migraine auras with other kinds of headaches.  Cluster headaches are also more common in males. No other focal neurologic deficits, associated symptoms, inciting events or modifiable factors.  Interval update 06/09/2016: 36 year old male with a past medical history of migraines and morbid obesity here for vision changes, possible papilledema seen on ophthalmology examination recently. [place] and his vision went blurry, very fuzzy and blurry for  about 4-5 minutes.  Happened 3 times, it was significant. Both eyes. Happened again the next day. It was not an aura, it was blurry. Went away eventually, blinking eyes. He continues to have episodes of significant vision changes. He is having 1-2 significant migraine a month. He has had episodes of feeling like his head is going to explode, severe pressure all around the head with 10 out of 10 pain. Not worse with laying down, he has noticed some new hearing changes, more of a  ringing. Discussion with patient and his mother. Symptoms are concerning for intracranial hypertension. Need to rule out tumors or masses causing the increased pressure as well as other intracranial etiologies. Discussed all the imaging needed as well as lumbar puncture and reasons why. Also discussed possible findings and how we would treat them. If consistent with idiopathic intracranial hypertension would likely start on Diamox. Patient has been on doxycycline recently for multiple infections and other medications as well, he recently spent 14 days in the hospital for infections and sepsis. No history of high doses of vitamin A use or other medications that are associated with pseudotumor cerebri. Discussed that weight loss is key in pseudotumor cerebri. Also discussed that untreated can cause permanent vision loss. She does vision or headaches worsen he is to proceed to the emergency room immediately.   Interval Update 02/27/2016:  He ws at work. Last Wednesday. He had acute onset of left eye vision loss. Then had a headache and lasted all day. Would not go away. Typical headache migraines, left side, throbbing, light sensitivity, sound sensitivity. He has a lot of light and sound sensitivity. No nausea or vomiting with the headaches. He has 4-5 headache days a month. Not typically any are migrainous. Had taken imitrex orally in the past. Migraines can be quick. He wakes up with a headache. He had a sleep study and he does not have sleep apnea. Did not need a cpap. They are not serious, they tend to go away quickly. Not often does he have the headaches. He is on Topamax and Verapamil and doing well.    Initial visit 11/14/2014 :  Alexis [name] is a 36 y.o. male here as a referral from Dr. Ronnald Ramp for headache.    Migraines are well controlled if he takes the indomethacin. If he falls asleep and forgets the indomethacin on the couch he will wake up with a headache. He will wake up at 4am with a headache. He is  on Verapamil and has not missed a dose. He does not have a lot of nausea with the migraines.  No signs of OSA.    He is on Wellbutrin. He says that he has been well on Wellbutrin for many years but he is unsure if it is helping him anymore. He has decreased energy and decreased concentration and he has gained weight and that makes him depressed.   Discussed that people with headaches often have decreased serotonin levels and that if location is not helping maybe we should change to an SSRI.       11/14/2014: Patient is here as an add on for new onset severe headache. PMHx of occular migraines. He is on '120mg'$  verapamil and completely controlled. Friday night they were at the theatre. The most he laughed the pressure was building in his head. It is on the left side. Throbbing is constant. He also feels stabbing in the brain in the temporal area. He has constant pain Extremely painful. The pain  was so bad that his BP shot up when he was on telemetry in the ED. The pain is constant but then he has periods of elevated pain in the head, the pain stabs and holds for a minute. The worst pain he has ever felt in his life. Severe 10/10. Eye waters, keeps getting hot flashes, clammy feeling. +light sensitivity. It has been continuous. No weakness, vision. Has some pulsatile hearing changes. He has been sick recently and has a sore throat and a sinus infection.   Reviewed notes, labs and imaging from outside physicians, which showed: MRI brain w/wo contrast 04/2012 showed no acute intracranial abnormalities including mass lesion or mass effect, hydrocephalus, extra-axial fluid collection, midline shift, hemorrhage, or acute infarction, large ischemic events (personally reviewed images). BMP/CMP unremarkable.    Review of Systems: Patient complains of symptoms per HPI as well as the following symptoms: headache, weight gain. Pertinent negatives per HPI. All others negative.  Social History   Socioeconomic History    Marital status: Single    Spouse name: Not on file   Number of children: 0   Years of education: College   Highest education level: Not on file  Occupational History   Occupation: TEFL teacher: [employer]   Occupation: EMT   Occupation: Glass blower/designer  Tobacco Use   Smoking status: Never   Smokeless tobacco: Never  Scientific laboratory technician Use: Never used  Substance and Sexual Activity   Alcohol use: No    Comment: socially   Drug use: No   Sexual activity: Yes  Other Topics Concern   Not on file  Social History Narrative   Lives at home with parents.   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   Caffeine use: 3-4 cups daily.    Social Determinants of [name]   Financial Resource Strain: Not on file  Food Insecurity: Not on file  Transportation Needs: Not on file  Physical Activity: Not on file  Stress: Not on file  Social Connections: Not on file  Intimate Partner Violence: Not on file    Family History  Problem Relation Age of Onset   Depression Mother    Cancer Mother    Thyroid disease Mother    Heart disease Mother    Hyperlipidemia Mother    Hypertension Mother    Asthma Mother    Clotting disorder Mother        Factor 5   Rheum arthritis Mother    Skin cancer Mother    Heart attack Mother    Migraines Mother    Kidney disease Mother    Anxiety disorder Mother    Bipolar disorder Mother    Obesity Mother    Obesity Father    Depression Father    Anxiety disorder Father    Hyperlipidemia Father    Hypertension Father    Diabetes Father        type 2    Liver disease Father    Sleep apnea Father    Emphysema Maternal Grandmother        smoker   Asthma Maternal Grandmother    Lung cancer Maternal Grandmother    Colon polyps Paternal Grandmother    Asthma Paternal Grandmother    Diabetes Paternal Grandmother        type 2   Clotting disorder Maternal Uncle         Factor  5   Allergies Other        whole family-both sides of family   Heart disease Other        MGM deceased with MI; both sides of grandparents    Past Medical History:  Diagnosis Date   Acid reflux    ADD 09/13/2007   ALLERGIC RHINITIS 06/10/2009   Anemia    Anxiety    ASTHMA, UNSPECIFIED, UNSPECIFIED STATUS 07/19/2008   Back pain    Bacterial sinusitis    Bilateral swelling of feet    C. difficile diarrhea    Dental abscess    caused sinusitis   DEPRESSIVE DISORDER 25/42/7062   DYSMETABOLIC SYNDROME 37/62/8315   Gallbladder problem    GERD 09/13/2007   Headache    HYPERLIPIDEMIA 10/10/2009   HYPERTENSION 09/13/2007   IBS 03/20/2009   IBS (irritable bowel syndrome)    Joint pain    Morbid obesity ([place]) 07/19/2008   Ocular migraine    Palpitations    Prediabetes    Skin cancer    SOB (shortness of breath)    Vitamin D deficiency     Past Surgical History:  Procedure Laterality Date   ANKLE SURGERY  10/12/2005   APPENDECTOMY  10/13/2003   BIOPSY  06/02/2021   Procedure: BIOPSY;  Surgeon: Gatha Mayer, MD;  Location: [facility];  Service: Endoscopy;;  EGD and COLON   COLONOSCOPY WITH PROPOFOL N/A 06/02/2021   Procedure: COLONOSCOPY WITH PROPOFOL;  Surgeon: Gatha Mayer, MD;  Location: [facility];  Service: Endoscopy;  Laterality: N/A;   ESOPHAGOGASTRODUODENOSCOPY (EGD) WITH PROPOFOL N/A 06/02/2021   Procedure: ESOPHAGOGASTRODUODENOSCOPY (EGD) WITH PROPOFOL;  Surgeon: Gatha Mayer, MD;  Location: [facility];  Service: Endoscopy;  Laterality: N/A;   IR GENERIC HISTORICAL  07/23/2016   IR FLUORO GUIDED NEEDLE PLC ASPIRATION/INJECTION LOC 07/23/2016 Arne Cleveland, MD [facility]   LASIK Bilateral 2018   SKIN CANCER EXCISION     teenager   TEE WITHOUT CARDIOVERSION N/A 04/15/2016   Procedure: TRANSESOPHAGEAL ECHOCARDIOGRAM (TEE);  Surgeon: Sanda Klein, MD;  Location: [facility] ENDOSCOPY;  Service: Cardiovascular;  Laterality: N/A;    Current  Outpatient Medications  Medication Sig Dispense Refill   ALPRAZolam (XANAX) 1 MG tablet Take 1 tablet (1 mg total) by mouth 2 (two) times daily as needed for anxiety. 30 tablet 3   buPROPion (WELLBUTRIN XL) 150 MG 24 hr tablet Take 150 mg by mouth daily.     cetirizine (ZYRTEC) 10 MG tablet 1 tablet     EPINEPHrine 0.3 mg/0.3 mL IJ SOAJ injection Inject into the muscle once.     famotidine (PEPCID) 20 MG tablet Take 1 tablet (20 mg total) by mouth 2 (two) times daily. 30 tablet 0   Ferrous Sulfate (IRON) 325 (65 Fe) MG TABS Take 1 tablet (325 mg total) by mouth daily. 30 tablet 0   furosemide (LASIX) 20 MG tablet Take 1 tablet (20 mg total) by mouth daily. (Patient taking differently: Take 20 mg by mouth daily as needed.) 30 tablet 3   Galcanezumab-gnlm (EMGALITY) 120 MG/ML SOAJ Inject 120 mg into the skin every 30 (thirty) days. 1.12 mL 11   irbesartan-hydrochlorothiazide (AVALIDE) 300-12.5 MG tablet TAKE 1 TABLET BY MOUTH EVERY DAY NEEDS APPT FOR REFILLS 90 tablet 1   Magnesium 100 MG CAPS Take 100 mg by mouth.     metFORMIN (GLUCOPHAGE) 500 MG tablet Take 1 tablet (500 mg total) by mouth daily with breakfast. 30 tablet 0   omeprazole (PRILOSEC) 40  MG capsule TAKE 1 CAPSULE BY MOUTH TWICE A DAY 180 capsule 1   ondansetron (ZOFRAN-ODT) 4 MG disintegrating tablet Take 1-2 tablets (4-8 mg total) by mouth every 8 (eight) hours as needed. 30 tablet 3   Phentermine HCl (LOMAIRA) 8 MG TABS Take 8 mg by mouth daily. 30 tablet 0   potassium chloride (KLOR-CON) 8 MEQ tablet Take 8 mEq by mouth daily.     PROAIR HFA 108 (90 Base) MCG/ACT inhaler INHALE 1-2 PUFFS EVERY 4-6 HOURS AS NEEDED FOR PERSISTENT COUGH OR INCREASED WORK OF BREATHING  0   topiramate (TOPAMAX) 25 MG tablet Take 1 tablet (25 mg total) by mouth 2 (two) times daily. 60 tablet 0   ursodiol (ACTIGALL) 300 MG capsule Take 300 mg by mouth 2 (two) times daily.     verapamil (VERELAN PM) 240 MG 24 hr capsule TAKE 1 CAPSULE BY MOUTH DAILY 30  capsule 0   Vitamin D, Ergocalciferol, (DRISDOL) 1.25 MG (50000 UNIT) CAPS capsule Take 1 capsule (50,000 Units total) by mouth every 7 (seven) days. 4 capsule 0   rizatriptan (MAXALT-MLT) 10 MG disintegrating tablet Take 1 tablet (10 mg total) by mouth as needed for migraine. May repeat in 2 hours if needed 12 tablet 11   No current facility-administered medications for this visit.    Allergies as of 09/23/2022 - Review Complete 08/25/2022  Allergen Reaction Noted   Citrus Anaphylaxis and Other (See Comments) 09/04/2012   Amoxicillin-pot clavulanate Nausea Only    Sulfamethoxazole-trimethoprim Other (See Comments)    Vancomycin Other (See Comments) 04/11/2016    Vitals: BP (!) 153/104 (BP Location: Left Arm, Patient Position: Sitting, Cuff Size: Large)   Pulse 96   Ht '6\' 6"'$  (1.981 m)   Wt (!) 473 lb 9.6 oz (214.8 kg)   BMI 54.73 kg/m  Last Weight:  Wt Readings from Last 1 Encounters:  09/23/22 (!) 473 lb 9.6 oz (214.8 kg)   Last Height:   Ht Readings from Last 1 Encounters:  09/23/22 '6\' 6"'$  (1.981 m)   Exam: NAD, pleasant                  Speech:    Speech is normal; fluent and spontaneous with normal comprehension.  Cognition:    The patient is oriented to person, place, and time;     recent and remote memory intact;     language fluent;    Cranial Nerves:    The pupils are equal, round, and reactive to light.Trigeminal sensation is intact and the muscles of mastication are normal. The face is symmetric. The palate elevates in the midline. Hearing intact. Voice is normal. Shoulder shrug is normal. The tongue has normal motion without fasciculations.   Coordination:  No dysmetria  Motor Observation:    No asymmetry, no atrophy, and no involuntary movements noted. Tone:    Normal muscle tone.     Strength:    Strength is V/V in the upper and lower limbs.      Sensation: intact to LT    Assessment/Plan:  This is a lovely 36 year old male with a past medical  history of chronic migraines treated with Topamax and multiple other medications, ocular migraines and cluster headaches treated with verapamil, cluster headaches also treated with verapamil.    - Migraine and cluster Prevention: Recommend titrating up the Topiramate - Dr. Rinaldo Cloud - Cluster headache prevention: Continue the Verapamil - '240mg'$  could increase to '360mg'$  but fearful of hypotension, this is a good medication for  cluster headaches and also for blood pressure - Acute: Rizatriptan as soon as the headache starts, can take with ondansetron and repeat 2 hours later. Could also try imitrex injections or nasal spray. See how the rizatriptan does and let us know if you need something faster.  Chronic migaines: 8-12 migraines a month and > 15 headache days a month for the last year. Doesn't feel the Ajovy helped. Botox is an option. Try emgality.  PRIOR appointment:   Cluster headaches: He has had these in the past, last time was several years ago, very consistent with cluster headaches.  We discussed the difference between cluster headaches and migraines.  I do feel as though he has both and there is higher incidence and migraine auras with other kinds of headaches.  Cluster headaches are also more common in males.  And as the name implies they do happen in "clusters", they can happen at the same time every year, they may leave for a while and then come back.  We discussed the various treatments which include high-dose steroids initially and he would not like to do that.  We did discuss that the erenumab which is a CGRP medication has been approved for this condition, today we will inject him with 3 Ajovy which I feel may be just as effective, we will also increase his verapamil which is first-line treatment for cluster headaches, I will give him injectable Imitrex at the onset of his headaches.  I also gave him samples of Nurtec for 14 days to see if this can bridge until the other medications start to  work.  Discussed oxygen however it is very difficult to get oxygen approved if you do not have a pulmonary disorder.  He will keep me updated on his progress and how the medications are working.   Cc: Dr. Elaina Hoops, MD   [facility] [address] [place], [place] [address]   Phone [phone] Fax [phone]   I spent over 30 minutes of face-to-face and non-face-to-face time with patient on the  1. Chronic migraine without aura without status migrainosus, not intractable   2. Episodic cluster headache, not intractable   3. Essential hypertension    diagnosis.  This included previsit chart review, lab review, study review, order entry, electronic [name] record documentation, patient education on the different diagnostic and therapeutic options, counseling and coordination of care, risks and benefits of management, compliance, or risk factor reduction

## 2022-09-24 ENCOUNTER — Other Ambulatory Visit (INDEPENDENT_AMBULATORY_CARE_PROVIDER_SITE_OTHER): Payer: Self-pay | Admitting: Family Medicine

## 2022-09-24 DIAGNOSIS — R632 Polyphagia: Secondary | ICD-10-CM

## 2022-10-01 ENCOUNTER — Encounter (INDEPENDENT_AMBULATORY_CARE_PROVIDER_SITE_OTHER): Payer: Self-pay | Admitting: Family Medicine

## 2022-10-01 ENCOUNTER — Telehealth (INDEPENDENT_AMBULATORY_CARE_PROVIDER_SITE_OTHER): Payer: BC Managed Care – PPO | Admitting: Family Medicine

## 2022-10-01 DIAGNOSIS — R632 Polyphagia: Secondary | ICD-10-CM | POA: Diagnosis not present

## 2022-10-01 DIAGNOSIS — E669 Obesity, unspecified: Secondary | ICD-10-CM

## 2022-10-01 DIAGNOSIS — R7303 Prediabetes: Secondary | ICD-10-CM | POA: Diagnosis not present

## 2022-10-01 DIAGNOSIS — E559 Vitamin D deficiency, unspecified: Secondary | ICD-10-CM | POA: Insufficient documentation

## 2022-10-01 DIAGNOSIS — Z6841 Body Mass Index (BMI) 40.0 and over, adult: Secondary | ICD-10-CM

## 2022-10-01 MED ORDER — VITAMIN D (ERGOCALCIFEROL) 1.25 MG (50000 UNIT) PO CAPS: 50000.0000 [IU] | ORAL_CAPSULE | ORAL | 0 refills | Status: DC

## 2022-10-01 MED ORDER — LOMAIRA 8 MG PO TABS: 8.0000 mg | ORAL_TABLET | Freq: Every day | ORAL | 0 refills | Status: DC

## 2022-10-01 MED ORDER — LOMAIRA 8 MG PO TABS
12.0000 mg | ORAL_TABLET | Freq: Every day | ORAL | 0 refills | Status: DC
Start: 2022-10-01 — End: 2022-11-09

## 2022-10-01 MED ORDER — TOPIRAMATE 25 MG PO TABS: 25.0000 mg | ORAL_TABLET | Freq: Two times a day (BID) | ORAL | 0 refills | Status: DC

## 2022-10-01 MED ORDER — TOPIRAMATE 25 MG PO TABS: 75.0000 mg | ORAL_TABLET | Freq: Every day | ORAL | 0 refills | Status: DC

## 2022-10-01 MED ORDER — METFORMIN HCL 500 MG PO TABS: 500.0000 mg | ORAL_TABLET | Freq: Every day | ORAL | 0 refills | Status: DC

## 2022-10-19 NOTE — Progress Notes (Signed)
TeleHealth Visit:  Due to the COVID-19 pandemic, this visit was completed with telemedicine (audio/video) technology to reduce patient and provider exposure as well as to preserve personal protective equipment.   [name] has verbally consented to this TeleHealth visit. The patient is located at home, the provider is located at the [facility] office. The participants in this visit include the listed provider and patient. The visit was conducted today via Mychart Video.  Chief Complaint: OBESITY Alexis [name] is here to discuss his progress with his obesity treatment plan along with follow-up of his obesity related diagnoses. Alexis [name] is on the Category 4 Plan and states he is following his eating plan approximately 90% of the time. Alexis [name] states he is going to the gym 15-20 minutes 3-4 times per week.  Today's visit was #: 13 Starting weight: 477 lbs Starting date: 12/02/2021  Interim History: Alexis [name] has not felt well--not sure if its URI, flu, COVID; dealing with cough, HA, congestion. Reports weight loss of at 6lbs since last appointment. Saw Dr. Jaynee Eagles between visits.  Subjective:   1. Polyphagia Simran's blood pressure elevated with Dr. Jaynee Eagles, Bp at home has been at 130-140/80. Voices he normally has blood pressures within normal limits. Starting weight of 469 lbs.  2. Vitamin D deficiency Lawarence is currently taking prescription Vit D 50,000 IU once a week. Denies any nausea, vomiting or muscle weakness. He notes fatigue.  3. Prediabetes No GI side effects with Metformin.  Assessment/Plan:   1. Polyphagia We will refill Topamax 75 mg daily and refill Phentermine 12 mg daily for 1 month with 0 refills.  -Refill topiramate (TOPAMAX) 25 MG tablet; Take 3 tablets (75 mg total) by mouth daily.  Dispense: 90 tablet; Refill: 0  -Refill Phentermine HCl (LOMAIRA) 8 MG TABS; Take 12 mg by mouth daily.  Dispense: 45 tablet; Refill: 0  2. Vitamin D deficiency We will refill Vit D  50K IU once a week for 1 month with 0 refills.  -Refill Vitamin D, Ergocalciferol, (DRISDOL) 1.25 MG (50000 UNIT) CAPS capsule; Take 1 capsule (50,000 Units total) by mouth every 7 (seven) days.  Dispense: 4 capsule; Refill: 0  3. Prediabetes We will refill Metformin 500 mg daily for 1 month with 0 refills.  -Refill metFORMIN (GLUCOPHAGE) 500 MG tablet; Take 1 tablet (500 mg total) by mouth daily with breakfast.  Dispense: 30 tablet; Refill: 0  4. Obesity with current BMI of 57.1 Deane is currently in the action stage of change. As such, his goal is to continue with weight loss efforts. He has agreed to the Category 4 Plan +300.   Exercise goals: All adults should avoid inactivity. Some physical activity is better than none, and adults who participate in any amount of physical activity gain some health benefits.  Behavioral modification strategies: increasing lean protein intake, meal planning and cooking strategies, keeping healthy foods in the home, holiday eating strategies , celebration eating strategies, and planning for success.  Javious has agreed to follow-up with our clinic in 4 weeks. He was informed of the importance of frequent follow-up visits to maximize his success with intensive lifestyle modifications for his multiple health conditions.  Objective:   VITALS: Per patient if applicable, see vitals. GENERAL: Alert and in no acute distress. CARDIOPULMONARY: No increased WOB. Speaking in clear sentences.  PSYCH: Pleasant and cooperative. Speech normal rate and rhythm. Affect is appropriate. Insight and judgement are appropriate. Attention is focused, linear, and appropriate.  NEURO: Oriented as arrived to appointment  on time with no prompting.   Lab Results  Component Value Date   CREATININE 1.04 04/28/2022   BUN 14 04/28/2022   NA 138 04/28/2022   K 3.7 04/28/2022   CL 103 04/28/2022   CO2 26 04/28/2022   Lab Results  Component Value Date   ALT 20 04/28/2022   AST  20 04/28/2022   ALKPHOS 58 04/28/2022   BILITOT 0.7 04/28/2022   Lab Results  Component Value Date   HGBA1C 5.8 (H) 12/02/2021   HGBA1C 5.4 11/18/2018   HGBA1C 5.5 03/08/2007   Lab Results  Component Value Date   INSULIN 34.5 (H) 12/02/2021   Lab Results  Component Value Date   TSH 2.930 12/02/2021   Lab Results  Component Value Date   CHOL 177 12/02/2021   HDL 43 12/02/2021   LDLCALC 111 (H) 12/02/2021   TRIG 130 12/02/2021   CHOLHDL 4 02/02/2018   Lab Results  Component Value Date   VD25OH 17.7 (L) 12/02/2021   Lab Results  Component Value Date   WBC 10.8 (H) 04/28/2022   HGB 13.7 04/28/2022   HCT 43.3 04/28/2022   MCV 76.4 (L) 04/28/2022   PLT 273 04/28/2022   Lab Results  Component Value Date   IRON 28 (L) 05/08/2021   TIBC 309 05/08/2021   FERRITIN 33.9 09/12/2021   Attestation Statements:   Reviewed by clinician on day of visit: allergies, medications, problem list, medical history, surgical history, family history, social history, and previous encounter notes.  I, Elnora Morrison, RMA am acting as transcriptionist for Coralie Common, MD.  I have reviewed the above documentation for accuracy and completeness, and I agree with the above. - Coralie Common, MD

## 2022-10-24 ENCOUNTER — Other Ambulatory Visit: Payer: Self-pay | Admitting: Neurology

## 2022-10-24 DIAGNOSIS — G43709 Chronic migraine without aura, not intractable, without status migrainosus: Secondary | ICD-10-CM

## 2022-10-24 DIAGNOSIS — I10 Essential (primary) hypertension: Secondary | ICD-10-CM

## 2022-10-24 DIAGNOSIS — G43109 Migraine with aura, not intractable, without status migrainosus: Secondary | ICD-10-CM

## 2022-10-24 DIAGNOSIS — G44019 Episodic cluster headache, not intractable: Secondary | ICD-10-CM

## 2022-11-02 ENCOUNTER — Ambulatory Visit (INDEPENDENT_AMBULATORY_CARE_PROVIDER_SITE_OTHER): Payer: BC Managed Care – PPO | Admitting: Family Medicine

## 2022-11-09 ENCOUNTER — Ambulatory Visit (INDEPENDENT_AMBULATORY_CARE_PROVIDER_SITE_OTHER): Payer: BC Managed Care – PPO | Admitting: Family Medicine

## 2022-11-09 ENCOUNTER — Encounter (INDEPENDENT_AMBULATORY_CARE_PROVIDER_SITE_OTHER): Payer: Self-pay | Admitting: Family Medicine

## 2022-11-09 VITALS — BP 110/75 | HR 107 | Temp 98.5°F | Ht 76.0 in

## 2022-11-09 DIAGNOSIS — E669 Obesity, unspecified: Secondary | ICD-10-CM

## 2022-11-09 DIAGNOSIS — Z6841 Body Mass Index (BMI) 40.0 and over, adult: Secondary | ICD-10-CM

## 2022-11-09 DIAGNOSIS — E559 Vitamin D deficiency, unspecified: Secondary | ICD-10-CM | POA: Diagnosis not present

## 2022-11-09 DIAGNOSIS — R632 Polyphagia: Secondary | ICD-10-CM | POA: Diagnosis not present

## 2022-11-09 DIAGNOSIS — R7303 Prediabetes: Secondary | ICD-10-CM

## 2022-11-09 MED ORDER — METFORMIN HCL 500 MG PO TABS: 500.0000 mg | ORAL_TABLET | Freq: Every day | ORAL | 0 refills | Status: DC

## 2022-11-09 MED ORDER — VITAMIN D (ERGOCALCIFEROL) 1.25 MG (50000 UNIT) PO CAPS: 50000.0000 [IU] | ORAL_CAPSULE | ORAL | 0 refills | Status: DC

## 2022-11-09 MED ORDER — TOPIRAMATE 25 MG PO TABS: 75.0000 mg | ORAL_TABLET | Freq: Every day | ORAL | 0 refills | Status: DC

## 2022-11-09 MED ORDER — LOMAIRA 8 MG PO TABS: 12.0000 mg | ORAL_TABLET | Freq: Every day | ORAL | 0 refills | Status: DC

## 2022-11-18 NOTE — Progress Notes (Signed)
Chief Complaint:   OBESITY Alexis [name] is here to discuss his progress with his obesity treatment plan along with follow-up of his obesity related diagnoses. Alexis [name] is on the Category 4 Plan and states he is following his eating plan approximately 90% of the time. Alexis [name] states he is going to gym 15-25 minutes 2-3 times per week.  Today's visit was #: 14 Starting weight: 477 lbs Starting date: 12/02/2021 Today's weight: 460 lbs Today's date: 11/09/2022 Total lbs lost to date: 17 lbs Total lbs lost since last in-office visit: 9  Interim History: Alexis [name] had influenza since last appointment.  He felt feverish and was down for most of the holiday season.  Mentions that he has slowly transition back to getting all food in.  The next few weeks only has work and continuing on plan.  Subjective:   1. Prediabetes Alexis [name]'s last A1c was 1 year ago.  On Metformin.  Alexis [name] is on combo Lomaira and Topiramate.  Only taking 8/50-down 9 pounds since starting.  Blood pressure well-controlled today.  3. Vitamin D deficiency Alexis [name] is currently taking prescription Vit D 50,000 IU once a week.   Denies any nausea, vomiting or muscle weakness.  He/  notes fatigue.  Assessment/Plan:   1. Prediabetes Will refill Metformin 500 mg by mouth daily for 1 month with 0 refills.  -Refill metFORMIN (GLUCOPHAGE) 500 MG tablet; Take 1 tablet (500 mg total) by mouth daily with breakfast.  Dispense: 30 tablet; Refill: 0  2. Polyphagia Refill/Increase Phentermine to 12 mg daily and refill/increase Topiramate to 75 mg daily for 1 with 0 refills.  -Refill Phentermine HCl (LOMAIRA) 8 MG TABS; Take 1.5 tablets (12 mg total) by mouth daily.  Dispense: 45 tablet; Refill: 0 -Refill topiramate (TOPAMAX) 25 MG tablet; Take 3 tablets (75 mg total) by mouth daily.  Dispense: 90 tablet; Refill: 0  3. Vitamin D deficiency We will refill Vit D 50K IU once a week for 1 month with 0 refills.  -Refill Vitamin D,  Ergocalciferol, (DRISDOL) 1.25 MG (50000 UNIT) CAPS capsule; Take 1 capsule (50,000 Units total) by mouth every 7 (seven) days.  Dispense: 4 capsule; Refill: 0  4. BMI 50.0-59.9, adult [facility]) Alexis [name] is currently in the action stage of change. As such, his goal is to continue with weight loss efforts. He has agreed to the Category 4 Plan.   Exercise goals: As is.  Behavioral modification strategies: increasing lean protein intake, meal planning and cooking strategies, keeping healthy foods in the home, and planning for success.  Alexis [name] has agreed to follow-up with our clinic in 3 weeks. He was informed of the importance of frequent follow-up visits to maximize his success with intensive lifestyle modifications for his multiple health conditions.   Objective:   Blood pressure 110/75, pulse (!) 107, temperature 98.5 F (36.9 C), height 6' 4"$  (1.93 m), SpO2 100 %. Body mass index is 57.65 kg/m.  General: Cooperative, alert, well developed, in no acute distress. HEENT: Conjunctivae and lids unremarkable. Cardiovascular: Regular rhythm.  Lungs: Normal work of breathing. Neurologic: No focal deficits.   Lab Results  Component Value Date   CREATININE 1.04 04/28/2022   BUN 14 04/28/2022   NA 138 04/28/2022   K 3.7 04/28/2022   CL 103 04/28/2022   CO2 26 04/28/2022   Lab Results  Component Value Date   ALT 20 04/28/2022   AST 20 04/28/2022   ALKPHOS 58 04/28/2022   BILITOT 0.7 04/28/2022   Lab Results  Component Value Date   HGBA1C 5.8 (H) 12/02/2021   HGBA1C 5.4 11/18/2018   HGBA1C 5.5 03/08/2007   Lab Results  Component Value Date   INSULIN 34.5 (H) 12/02/2021   Lab Results  Component Value Date   TSH 2.930 12/02/2021   Lab Results  Component Value Date   CHOL 177 12/02/2021   HDL 43 12/02/2021   LDLCALC 111 (H) 12/02/2021   TRIG 130 12/02/2021   CHOLHDL 4 02/02/2018   Lab Results  Component Value Date   VD25OH 17.7 (L) 12/02/2021   Lab Results  Component  Value Date   WBC 10.8 (H) 04/28/2022   HGB 13.7 04/28/2022   HCT 43.3 04/28/2022   MCV 76.4 (L) 04/28/2022   PLT 273 04/28/2022   Lab Results  Component Value Date   IRON 28 (L) 05/08/2021   TIBC 309 05/08/2021   FERRITIN 33.9 09/12/2021   Attestation Statements:   Reviewed by clinician on day of visit: allergies, medications, problem list, medical history, surgical history, family history, social history, and previous encounter notes.  I, Elnora Morrison, RMA am acting as transcriptionist for Coralie Common, MD.  I have reviewed the above documentation for accuracy and completeness, and I agree with the above. - Coralie Common, MD

## 2022-12-03 ENCOUNTER — Ambulatory Visit (INDEPENDENT_AMBULATORY_CARE_PROVIDER_SITE_OTHER): Payer: BC Managed Care – PPO | Admitting: Family Medicine

## 2022-12-03 ENCOUNTER — Encounter (INDEPENDENT_AMBULATORY_CARE_PROVIDER_SITE_OTHER): Payer: Self-pay | Admitting: Family Medicine

## 2022-12-03 VITALS — BP 125/78 | HR 96 | Temp 98.6°F | Ht 76.0 in | Wt >= 6400 oz

## 2022-12-03 DIAGNOSIS — R5383 Other fatigue: Secondary | ICD-10-CM

## 2022-12-03 DIAGNOSIS — D509 Iron deficiency anemia, unspecified: Secondary | ICD-10-CM

## 2022-12-03 DIAGNOSIS — E559 Vitamin D deficiency, unspecified: Secondary | ICD-10-CM

## 2022-12-03 DIAGNOSIS — E785 Hyperlipidemia, unspecified: Secondary | ICD-10-CM

## 2022-12-03 DIAGNOSIS — Z6841 Body Mass Index (BMI) 40.0 and over, adult: Secondary | ICD-10-CM

## 2022-12-03 DIAGNOSIS — R632 Polyphagia: Secondary | ICD-10-CM

## 2022-12-03 DIAGNOSIS — R7303 Prediabetes: Secondary | ICD-10-CM

## 2022-12-03 DIAGNOSIS — E669 Obesity, unspecified: Secondary | ICD-10-CM

## 2022-12-03 MED ORDER — VITAMIN D (ERGOCALCIFEROL) 1.25 MG (50000 UNIT) PO CAPS: 50000.0000 [IU] | ORAL_CAPSULE | ORAL | 0 refills | Status: DC

## 2022-12-03 MED ORDER — METFORMIN HCL 500 MG PO TABS: 500.0000 mg | ORAL_TABLET | Freq: Every day | ORAL | 0 refills | Status: DC

## 2022-12-03 MED ORDER — LOMAIRA 8 MG PO TABS: 12.0000 mg | ORAL_TABLET | Freq: Every day | ORAL | 0 refills | Status: DC

## 2022-12-03 MED ORDER — TOPIRAMATE 25 MG PO TABS: 75.0000 mg | ORAL_TABLET | Freq: Every day | ORAL | 0 refills | Status: DC

## 2022-12-03 NOTE — Progress Notes (Deleted)
Since last appointment he has been eating and following meal plan and is staying focused.  Feels good on combo phentermine/ topiramate.  He occasionally isn't getting all food in due to fatigue.  Next few weeks no events or activities.  Next major milestone is 5 year anniversary with his partner.  Wants to perhaps take a small vacation. Next few weeks he doesn't have any events, activities or travel.

## 2022-12-10 NOTE — Progress Notes (Signed)
Chief Complaint:   OBESITY Alexis [name] is here to discuss his progress with his obesity treatment plan along with follow-up of his obesity related diagnoses. Alexis [name] is on the Category 4 Plan and states he is following his eating plan approximately 90% of the time. Alexis [name] states he is exercising/walking 1-2 miles 4 times per week.  Today's visit was #: 15 Starting weight: 477 lbs Starting date: 12/02/2021 Today's weight: 454 lbs Today's date: 12/03/2022 Total lbs lost to date: 23 lbs Total lbs lost since last in-office visit: 6  Interim History: Since last appointment Alexis [name] has been eating and following meal plan and is staying focused.  Feels good on combo phentermine/ topiramate.  He occasionally isn't getting all food in due to fatigue.  Next few weeks no events or activities.  Next major milestone is 5 year anniversary with his partner.  Wants to perhaps take a small vacation. Next few weeks he doesn't have any events, activities or travel.  Subjective:   1. Prediabetes Last A1c 5.8, insulin 34.5.  2. Polyphagia PDMP checked.  Starting weight at 469 lbs.  Needs to lose 5% or 23 lbs and has lost 15/23.  3. Vitamin D deficiency Alexis [name] is currently taking prescription Vit D 50,000 IU once a week.  Denies any nausea, vomiting or muscle weakness.  He notes fatigue.  4. Hyperlipidemia LDL goal <130 Last LDL slightly elevated.  Not on medications.  5. Iron deficiency anemia, unspecified iron deficiency anemia type On Iron by mouth.  6. Other fatigue Significant fatigue.  Symptoms present since initial appointment.  Assessment/Plan:   1. Prediabetes We will obtain labs today.  Will refill Metformin 500 mg daily for 1 month with 0 refills.  -Refill metFORMIN (GLUCOPHAGE) 500 MG tablet; Take 1 tablet (500 mg total) by mouth daily with breakfast.  Dispense: 30 tablet; Refill: 0  - Comprehensive metabolic panel - Hemoglobin A1c - Insulin, random  2. Polyphagia Will refill  Phentermine 12 mg daily and refill Topamax 75 mg daily for 1 month with 0 refills.  -Refill Phentermine HCl (LOMAIRA) 8 MG TABS; Take 1.5 tablets (12 mg total) by mouth daily.  Dispense: 45 tablet; Refill: 0  -Refill topiramate (TOPAMAX) 25 MG tablet; Take 3 tablets (75 mg total) by mouth daily.  Dispense: 90 tablet; Refill: 0  3. Vitamin D deficiency We will obtain labs today.  We will refill Vit D 50K IU once a week for 1 month with 0 refills.  -Refill Vitamin D, Ergocalciferol, (DRISDOL) 1.25 MG (50000 UNIT) CAPS capsule; Take 1 capsule (50,000 Units total) by mouth every 7 (seven) days.  Dispense: 4 capsule; Refill: 0  - VITAMIN D 25 Hydroxy (Vit-D Deficiency, Fractures)  4. Hyperlipidemia LDL goal <130 We will obtain labs today.  - Lipid Panel With LDL/HDL Ratio  5. Iron deficiency anemia, unspecified iron deficiency anemia type We will obtain labs today.  - CBC w/Diff/Platelet - Anemia panel  6. Other fatigue We will obtain labs today.  - Testosterone,Free and Total  7. BMI 50.0-59.9, adult ([place])  8. Obesity with starting BMI of 58.0 Alexis [name] is currently in the action stage of change. As such, his goal is to continue with weight loss efforts. He has agreed to the Category 4 Plan +300.   Exercise goals: As is.  Behavioral modification strategies: increasing lean protein intake, meal planning and cooking strategies, keeping healthy foods in the home, and planning for success.  Alexis [name] has agreed to follow-up with our clinic in 4  weeks. He was informed of the importance of frequent follow-up visits to maximize his success with intensive lifestyle modifications for his multiple health conditions.   Alexis [name] was informed we would discuss his lab results at his next visit unless there is a critical issue that needs to be addressed sooner. Alexis [name] agreed to keep his next visit at the agreed upon time to discuss these results.  Objective:   Blood pressure 125/78, pulse 96,  temperature 98.6 F (37 C), height 6\' 4"  (1.93 m), weight (!) 454 lb (205.9 kg), SpO2 100 %. Body mass index is 55.26 kg/m.  General: Cooperative, alert, well developed, in no acute distress. HEENT: Conjunctivae and lids unremarkable. Cardiovascular: Regular rhythm.  Lungs: Normal work of breathing. Neurologic: No focal deficits.   Lab Results  Component Value Date   CREATININE 1.04 04/28/2022   BUN 14 04/28/2022   NA 138 04/28/2022   K 3.7 04/28/2022   CL 103 04/28/2022   CO2 26 04/28/2022   Lab Results  Component Value Date   ALT 20 04/28/2022   AST 20 04/28/2022   ALKPHOS 58 04/28/2022   BILITOT 0.7 04/28/2022   Lab Results  Component Value Date   HGBA1C 5.8 (H) 12/02/2021   HGBA1C 5.4 11/18/2018   HGBA1C 5.5 03/08/2007   Lab Results  Component Value Date   INSULIN 34.5 (H) 12/02/2021   Lab Results  Component Value Date   TSH 2.930 12/02/2021   Lab Results  Component Value Date   CHOL 177 12/02/2021   HDL 43 12/02/2021   LDLCALC 111 (H) 12/02/2021   TRIG 130 12/02/2021   CHOLHDL 4 02/02/2018   Lab Results  Component Value Date   VD25OH 17.7 (L) 12/02/2021   Lab Results  Component Value Date   WBC 10.8 (H) 04/28/2022   HGB 13.7 04/28/2022   HCT 43.3 04/28/2022   MCV 76.4 (L) 04/28/2022   PLT 273 04/28/2022   Lab Results  Component Value Date   IRON 28 (L) 05/08/2021   TIBC 309 05/08/2021   FERRITIN 33.9 09/12/2021   Attestation Statements:   Reviewed by clinician on day of visit: allergies, medications, problem list, medical history, surgical history, family history, social history, and previous encounter notes.  I, Elnora Morrison, RMA am acting as transcriptionist for Coralie Common, MD.  I have reviewed the above documentation for accuracy and completeness, and I agree with the above. - Coralie Common, MD

## 2022-12-16 DIAGNOSIS — E785 Hyperlipidemia, unspecified: Secondary | ICD-10-CM | POA: Diagnosis not present

## 2022-12-16 DIAGNOSIS — E559 Vitamin D deficiency, unspecified: Secondary | ICD-10-CM | POA: Diagnosis not present

## 2022-12-16 DIAGNOSIS — R7303 Prediabetes: Secondary | ICD-10-CM | POA: Diagnosis not present

## 2022-12-16 DIAGNOSIS — D509 Iron deficiency anemia, unspecified: Secondary | ICD-10-CM | POA: Diagnosis not present

## 2022-12-19 LAB — CBC WITH DIFFERENTIAL/PLATELET
Basophils Absolute: 0.1 10*3/uL (ref 0.0–0.2)
Basos: 1 %
EOS (ABSOLUTE): 0.2 10*3/uL (ref 0.0–0.4)
Eos: 2 %
Hemoglobin: 14.5 g/dL (ref 13.0–17.7)
Immature Grans (Abs): 0.1 10*3/uL (ref 0.0–0.1)
Immature Granulocytes: 1 %
Lymphocytes Absolute: 1.8 10*3/uL (ref 0.7–3.1)
Lymphs: 19 %
MCH: 25 pg — ABNORMAL LOW (ref 26.6–33.0)
MCHC: 32.2 g/dL (ref 31.5–35.7)
MCV: 78 fL — ABNORMAL LOW (ref 79–97)
Monocytes Absolute: 0.8 10*3/uL (ref 0.1–0.9)
Monocytes: 9 %
Neutrophils Absolute: 6.3 10*3/uL (ref 1.4–7.0)
Neutrophils: 68 %
Platelets: 275 10*3/uL (ref 150–450)
RBC: 5.81 x10E6/uL — ABNORMAL HIGH (ref 4.14–5.80)
RDW: 16 % — ABNORMAL HIGH (ref 11.6–15.4)
WBC: 9.1 10*3/uL (ref 3.4–10.8)

## 2022-12-19 LAB — COMPREHENSIVE METABOLIC PANEL
ALT: 28 IU/L (ref 0–44)
AST: 19 IU/L (ref 0–40)
Albumin/Globulin Ratio: 1.3 (ref 1.2–2.2)
Albumin: 3.9 g/dL — ABNORMAL LOW (ref 4.1–5.1)
Alkaline Phosphatase: 71 IU/L (ref 44–121)
BUN/Creatinine Ratio: 10 (ref 9–20)
BUN: 12 mg/dL (ref 6–20)
Bilirubin Total: 0.9 mg/dL (ref 0.0–1.2)
CO2: 24 mmol/L (ref 20–29)
Calcium: 9.1 mg/dL (ref 8.7–10.2)
Chloride: 99 mmol/L (ref 96–106)
Creatinine, Ser: 1.23 mg/dL (ref 0.76–1.27)
Globulin, Total: 2.9 g/dL (ref 1.5–4.5)
Glucose: 96 mg/dL (ref 70–99)
Potassium: 3.2 mmol/L — ABNORMAL LOW (ref 3.5–5.2)
Sodium: 138 mmol/L (ref 134–144)
Total Protein: 6.8 g/dL (ref 6.0–8.5)
eGFR: 78 mL/min/{1.73_m2} (ref >59)

## 2022-12-19 LAB — ANEMIA PANEL
Ferritin: 100 ng/mL (ref 30–400)
Folate, Hemolysate: 333 ng/mL
Folate, RBC: 740 ng/mL (ref >498)
Hematocrit: 45 % (ref 37.5–51.0)
Iron Saturation: 14 % — ABNORMAL LOW (ref 15–55)
Iron: 39 ug/dL (ref 38–169)
Retic Ct Pct: 1.5 % (ref 0.6–2.6)
Total Iron Binding Capacity: 270 ug/dL (ref 250–450)
UIBC: 231 ug/dL (ref 111–343)
Vitamin B-12: 436 pg/mL (ref 232–1245)

## 2022-12-19 LAB — VITAMIN D 25 HYDROXY (VIT D DEFICIENCY, FRACTURES): Vit D, 25-Hydroxy: 37.8 ng/mL (ref 30.0–100.0)

## 2022-12-19 LAB — HEMOGLOBIN A1C
Est. average glucose Bld gHb Est-mCnc: 117 mg/dL
Hgb A1c MFr Bld: 5.7 % — ABNORMAL HIGH (ref 4.8–5.6)

## 2022-12-19 LAB — LIPID PANEL WITH LDL/HDL RATIO
Cholesterol, Total: 167 mg/dL (ref 100–199)
HDL: 44 mg/dL (ref >39)
LDL Chol Calc (NIH): 103 mg/dL — ABNORMAL HIGH (ref 0–99)
LDL/HDL Ratio: 2.3 ratio (ref 0.0–3.6)
Triglycerides: 107 mg/dL (ref 0–149)
VLDL Cholesterol Cal: 20 mg/dL (ref 5–40)

## 2022-12-19 LAB — INSULIN, RANDOM: INSULIN: 64.4 u[IU]/mL — ABNORMAL HIGH (ref 2.6–24.9)

## 2022-12-19 LAB — TESTOSTERONE,FREE AND TOTAL
Testosterone, Free: 4.9 pg/mL — ABNORMAL LOW (ref 8.7–25.1)
Testosterone: 381 ng/dL (ref 264–916)

## 2022-12-31 ENCOUNTER — Encounter (INDEPENDENT_AMBULATORY_CARE_PROVIDER_SITE_OTHER): Payer: Self-pay | Admitting: Family Medicine

## 2022-12-31 ENCOUNTER — Ambulatory Visit (INDEPENDENT_AMBULATORY_CARE_PROVIDER_SITE_OTHER): Payer: BC Managed Care – PPO | Admitting: Family Medicine

## 2022-12-31 VITALS — BP 116/76 | HR 99 | Temp 98.7°F | Ht 76.0 in | Wt >= 6400 oz

## 2022-12-31 DIAGNOSIS — E559 Vitamin D deficiency, unspecified: Secondary | ICD-10-CM | POA: Diagnosis not present

## 2022-12-31 DIAGNOSIS — E669 Obesity, unspecified: Secondary | ICD-10-CM

## 2022-12-31 DIAGNOSIS — D508 Other iron deficiency anemias: Secondary | ICD-10-CM | POA: Diagnosis not present

## 2022-12-31 DIAGNOSIS — E349 Endocrine disorder, unspecified: Secondary | ICD-10-CM

## 2022-12-31 DIAGNOSIS — R7303 Prediabetes: Secondary | ICD-10-CM

## 2022-12-31 DIAGNOSIS — R632 Polyphagia: Secondary | ICD-10-CM | POA: Diagnosis not present

## 2022-12-31 DIAGNOSIS — Z6841 Body Mass Index (BMI) 40.0 and over, adult: Secondary | ICD-10-CM

## 2022-12-31 NOTE — Progress Notes (Deleted)
Patient feels like he has been doing well on meal plan.  Doing well following around 90% of the time.  No hunger.  The combo medication of lomaira/ topiramate is really helping control eating and desire for food. He is going to the gym 2-3 times a week.  Upcoming few weeks he only has work- no events or travel.  Denies any changes that need to be made to the plan.

## 2023-01-04 ENCOUNTER — Telehealth: Payer: BC Managed Care – PPO | Admitting: Family Medicine

## 2023-01-04 ENCOUNTER — Telehealth: Payer: BC Managed Care – PPO | Admitting: Neurology

## 2023-01-04 ENCOUNTER — Telehealth: Payer: Self-pay | Admitting: Neurology

## 2023-01-04 DIAGNOSIS — J019 Acute sinusitis, unspecified: Secondary | ICD-10-CM

## 2023-01-04 DIAGNOSIS — B9689 Other specified bacterial agents as the cause of diseases classified elsewhere: Secondary | ICD-10-CM

## 2023-01-04 MED ORDER — DOXYCYCLINE HYCLATE 100 MG PO TABS: 100.0000 mg | ORAL_TABLET | Freq: Two times a day (BID) | ORAL | 0 refills | Status: AC

## 2023-01-04 NOTE — Telephone Encounter (Signed)
LVM and sent mychart msg informing pt of need to reschedule 01/04/23 appointment due to MD being out. If pt calls back, pt can be rescheduled with NP per Dr. Jaynee Eagles

## 2023-01-04 NOTE — Progress Notes (Signed)
E-Visit for Sinus Problems  We are sorry that you are not feeling well.  Here is how we plan to help!  Based on what you have shared with me it looks like you have sinusitis.  Sinusitis is inflammation and infection in the sinus cavities of the head.  Based on your presentation I believe you most likely have Acute Bacterial Sinusitis.  This is an infection caused by bacteria and is treated with antibiotics. I have prescribed Doxycycline 100mg  by mouth twice a day for 10 days. To be filled in two days if not improved with continued OTC treatment that you have been doing.   You may use an oral decongestant such as Mucinex D or if you have glaucoma or high blood pressure use plain Mucinex. Saline nasal spray help and can safely be used as often as needed for congestion.  If you develop worsening sinus pain, fever or notice severe headache and vision changes, or if symptoms are not better after completion of antibiotic, please schedule an appointment with a health care provider.    Sinus infections are not as easily transmitted as other respiratory infection, however we still recommend that you avoid close contact with loved ones, especially the very young and elderly.  Remember to wash your hands thoroughly throughout the day as this is the number one way to prevent the spread of infection!  Home Care: Only take medications as instructed by your medical team. Complete the entire course of an antibiotic. Do not take these medications with alcohol. A steam or ultrasonic humidifier can help congestion.  You can place a towel over your head and breathe in the steam from hot water coming from a faucet. Avoid close contacts especially the very young and the elderly. Cover your mouth when you cough or sneeze. Always remember to wash your hands.  Get Help Right Away If: You develop worsening fever or sinus pain. You develop a severe head ache or visual changes. Your symptoms persist after you have  completed your treatment plan.  Make sure you Understand these instructions. Will watch your condition. Will get help right away if you are not doing well or get worse.  Thank you for choosing an e-visit.  Your e-visit answers were reviewed by a board certified advanced clinical practitioner to complete your personal care plan. Depending upon the condition, your plan could have included both over the counter or prescription medications.  Please review your pharmacy choice. Make sure the pharmacy is open so you can pick up prescription now. If there is a problem, you may contact your provider through [employer] and have the prescription routed to another pharmacy.  Your safety is important to [place]. If you have drug allergies check your prescription carefully.   For the next 24 hours you can use MyChart to ask questions about today's visit, request a non-urgent call back, or ask for a work or school excuse. You will get an email in the next two days asking about your experience. I hope that your e-visit has been valuable and will speed your recovery.  I provided 5 minutes of non face-to-face time during this encounter for chart review, medication and order placement, as well as and documentation.

## 2023-01-05 MED ORDER — METFORMIN HCL 500 MG PO TABS: 500.0000 mg | ORAL_TABLET | Freq: Every day | ORAL | 0 refills | Status: DC

## 2023-01-05 MED ORDER — LOMAIRA 8 MG PO TABS: 12.0000 mg | ORAL_TABLET | Freq: Every day | ORAL | 0 refills | Status: DC

## 2023-01-05 MED ORDER — VITAMIN D (ERGOCALCIFEROL) 1.25 MG (50000 UNIT) PO CAPS: 50000.0000 [IU] | ORAL_CAPSULE | ORAL | 0 refills | Status: DC

## 2023-01-05 MED ORDER — TOPIRAMATE 25 MG PO TABS: 75.0000 mg | ORAL_TABLET | Freq: Every day | ORAL | 0 refills | Status: DC

## 2023-01-06 NOTE — Progress Notes (Signed)
Chief Complaint:   OBESITY Alexis [name] is here to discuss his progress with his obesity treatment plan along with follow-up of his obesity related diagnoses. Alexis [name] is on the Category 4 plan and states he is following his eating plan approximately 90% of the time. Alexis [name] states he is going to the gym 15-20 minutes 2-3 times per week.  Today's visit was #: 16 Starting weight: 477 lbs Starting date: 12/02/2021 Today's weight: 449 lbs Today's date: 12/31/2022 Total lbs lost to date: 28 Total lbs lost since last in-office visit: -5  Interim History: Patient feels like he has been doing well on meal plan.  Doing well following around 90% of the time.  No hunger.  The combo medication of lomaira/ topiramate is really helping control eating and desire for food. He is going to the gym 2-3 times a week.  Upcoming few weeks he only has work- no events or travel.  Denies any changes that need to be made to the plan.   Subjective:   1. Vitamin D deficiency On prescription vitamin D.  No nausea, vomiting, muscle weakness. Last labs seventeen 1 year ago, now 37.8. Discussed recent lab results.   2. Polyphagia On combo Lomaira and topiramate. PDMP checked, no concerns.  Starting weight of 469, now down 20 pounds since starting.  3. Prediabetes On metformin.  No GI side effects. Improved A1c 5.8 down to 5.7. Insulin increased as expected. Discussed recent lab results.   4. Other iron deficiency anemia On p.o. iron daily.  Slight increase in MCV from 76-78.  No constipating effects.  RDW still elevated.  Iron low end of normal. Discussed recent lab results.   5. Testosterone deficiency Decreased free testosterone. Positive for fatigue. Discussed recent lab results.  Assessment/Plan:   1. Vitamin D deficiency Refill- - Vitamin D, Ergocalciferol, (DRISDOL) 1.25 MG (50000 UNIT) CAPS capsule; Take 1 capsule (50,000 Units total) by mouth every 7 (seven) days.  Dispense: 4 capsule; Refill:  0  2. Polyphagia Refill- - Phentermine HCl (LOMAIRA) 8 MG TABS; Take 1.5 tablets (12 mg total) by mouth daily.  Dispense: 45 tablet; Refill: 0 - topiramate (TOPAMAX) 25 MG tablet; Take 3 tablets (75 mg total) by mouth daily.  Dispense: 90 tablet; Refill: 0  3. Prediabetes Refill- - metFORMIN (GLUCOPHAGE) 500 MG tablet; Take 1 tablet (500 mg total) by mouth daily with breakfast.  Dispense: 30 tablet; Refill: 0  4. Other iron deficiency anemia Continue p.o. iron.  Patient changed to prenatal vitamin which has more iron.  5. Testosterone deficiency Patient to follow-up with PCP to see if she will manage.  6. BMI 50.0-59.9, adult ([place]) Obesity with starting BMI of 58.0 Alexis [name] is currently in the action stage of change. As such, his goal is to continue with weight loss efforts. He has agreed to the Category 4 plan.  Exercise goals: As is  Behavioral modification strategies: increasing lean protein intake, meal planning and cooking strategies, keeping healthy foods in the home, and planning for success.  [name] has agreed to follow-up with our clinic in 3-4 weeks. He was informed of the importance of frequent follow-up visits to maximize his success with intensive lifestyle modifications for his multiple health conditions.    Objective:   Blood pressure 116/76, pulse 99, temperature 98.7 F (37.1 C), height 6\' 4"  (1.93 m), weight (!) 449 lb (203.7 kg), SpO2 99 %. Body mass index is 54.65 kg/m.  General: Cooperative, alert, well developed, in no acute distress. HEENT: Conjunctivae  and lids unremarkable. Cardiovascular: Regular rhythm.  Lungs: Normal work of breathing. Neurologic: No focal deficits.   Lab Results  Component Value Date   CREATININE 1.23 12/16/2022   BUN 12 12/16/2022   NA 138 12/16/2022   K 3.2 (L) 12/16/2022   CL 99 12/16/2022   CO2 24 12/16/2022   Lab Results  Component Value Date   ALT 28 12/16/2022   AST 19 12/16/2022   ALKPHOS 71 12/16/2022    BILITOT 0.9 12/16/2022   Lab Results  Component Value Date   HGBA1C 5.7 (H) 12/16/2022   HGBA1C 5.8 (H) 12/02/2021   HGBA1C 5.4 11/18/2018   HGBA1C 5.5 03/08/2007   Lab Results  Component Value Date   INSULIN 64.4 (H) 12/16/2022   INSULIN 34.5 (H) 12/02/2021   Lab Results  Component Value Date   TSH 2.930 12/02/2021   Lab Results  Component Value Date   CHOL 167 12/16/2022   HDL 44 12/16/2022   LDLCALC 103 (H) 12/16/2022   TRIG 107 12/16/2022   CHOLHDL 4 02/02/2018   Lab Results  Component Value Date   VD25OH 37.8 12/16/2022   VD25OH 17.7 (L) 12/02/2021   Lab Results  Component Value Date   WBC 9.1 12/16/2022   HGB 14.5 12/16/2022   HCT 45.0 12/16/2022   MCV 78 (L) 12/16/2022   PLT 275 12/16/2022   Lab Results  Component Value Date   IRON 39 12/16/2022   TIBC 270 12/16/2022   FERRITIN 100 12/16/2022    Attestation Statements:   Reviewed by clinician on day of visit: allergies, medications, problem list, medical history, surgical history, family history, social history, and previous encounter notes.  I, Dawn Whitmire, FNP-C, am acting as Location manager for Coralie Common, MD.  I have reviewed the above documentation for accuracy and completeness, and I agree with the above. - Coralie Common, MD

## 2023-01-28 ENCOUNTER — Ambulatory Visit
Admission: RE | Admit: 2023-01-28 | Discharge: 2023-01-28 | Disposition: A | Discharge status: Home / Self Care | Payer: BC Managed Care – PPO | Source: Ambulatory Visit | Attending: Urgent Care | Admitting: Urgent Care

## 2023-01-28 ENCOUNTER — Other Ambulatory Visit: Payer: Self-pay

## 2023-01-28 VITALS — BP 138/91 | HR 102 | Temp 98.2°F | Resp 18

## 2023-01-28 DIAGNOSIS — K1379 Other lesions of oral mucosa: Secondary | ICD-10-CM | POA: Diagnosis not present

## 2023-01-28 DIAGNOSIS — J329 Chronic sinusitis, unspecified: Secondary | ICD-10-CM

## 2023-01-28 MED ORDER — CEFDINIR 300 MG PO CAPS: 300.0000 mg | ORAL_CAPSULE | Freq: Two times a day (BID) | ORAL | 0 refills | Status: AC

## 2023-01-28 MED ORDER — PREDNISONE 50 MG PO TABS: 50.0000 mg | ORAL_TABLET | Freq: Every day | ORAL | 0 refills | Status: DC

## 2023-01-28 MED ORDER — PREDNISONE 50 MG PO TABS
50.0000 mg | ORAL_TABLET | Freq: Every day | ORAL | 0 refills | Status: AC
Start: 2023-01-28 — End: 2023-02-02

## 2023-01-28 MED ORDER — CLINDAMYCIN HCL 300 MG PO CAPS: 300.0000 mg | ORAL_CAPSULE | Freq: Three times a day (TID) | ORAL | 0 refills | Status: AC

## 2023-01-28 MED ORDER — CLINDAMYCIN HCL 300 MG PO CAPS: 300.0000 mg | ORAL_CAPSULE | Freq: Three times a day (TID) | ORAL | 0 refills | Status: DC

## 2023-01-28 MED ORDER — LIDOCAINE VISCOUS HCL 2 % MT SOLN: 5.0000 mL | Freq: Three times a day (TID) | OROMUCOSAL | 0 refills | Status: DC | PRN

## 2023-01-28 MED ORDER — LIDOCAINE VISCOUS HCL 2 % MT SOLN
5.0000 mL | Freq: Three times a day (TID) | ORAL | 0 refills | Status: DC | PRN
Start: 2023-01-28 — End: 2023-09-13

## 2023-01-28 MED ORDER — CEFDINIR 300 MG PO CAPS: 300.0000 mg | ORAL_CAPSULE | Freq: Two times a day (BID) | ORAL | 0 refills | Status: DC

## 2023-01-28 NOTE — ED Triage Notes (Addendum)
March 25 had an e-visit for a sinus infection.  Patient was given doxycycline, finished this med and felt better, but quickly started having tongue, throat pain.  Continues to cough up green phlegm.    Has a "new patient" appt with ent next Tuesday  Patient reports burning and soreness in back of throat , followed by coughing and tightness  Patient has been using an otc remedy for thrush

## 2023-01-28 NOTE — ED Provider Notes (Signed)
[facility]    CSN: [id number] Arrival date & time: 01/28/23  1553      History   Chief Complaint Chief Complaint  Patient presents with   Sore Throat    cough, sore throat, negative strep test. I feel like I have tonsillitis and thrush, was on antibiotics 2 weeks ago for a sinus infection. I have an new patient apt next Tuesday w/ ent but cant wait that long. - Entered by patient    HPI Alexis [name] is a 37 y.o. male.    Sore Throat    Patient presents to urgent care with complaint of "burning" and "soreness" in the back of his throat.  He also reports coughing and chest tightness.  Patient believes he has oral thrush and has been using an over-the-counter "remedy".  Patient had ED visit on 01/04/2023 for reported sinus infection.  Provider recommended supportive care however prescribed backup doxycycline which the patient filled and took.  He states that he finished that medication and symptoms improved but then developed having tongue, and throat pain.  Patient states he has negative strep and mono (self test at work).  Unable to sleep because of cough and sore throat.  Patient has prescriptions for metformin for weight management, not DM 2.  Recent A1c shows prediabetic.  Past Medical History:  Diagnosis Date   Acid reflux    ADD 09/13/2007   ALLERGIC RHINITIS 06/10/2009   Anemia    Anxiety    ASTHMA, UNSPECIFIED, UNSPECIFIED STATUS 07/19/2008   Back pain    Bacterial sinusitis    Bilateral swelling of feet    C. difficile diarrhea    Dental abscess    caused sinusitis   DEPRESSIVE DISORDER 09/13/2007   DYSMETABOLIC SYNDROME 10/25/2009   Gallbladder problem    GERD 09/13/2007   Headache    HYPERLIPIDEMIA 10/10/2009   HYPERTENSION 09/13/2007   IBS 03/20/2009   IBS (irritable bowel syndrome)    Joint pain    Morbid obesity 07/19/2008   Ocular migraine    Palpitations    Prediabetes    Skin cancer    SOB (shortness of breath)    Vitamin D  deficiency     Patient Active Problem List   Diagnosis Date Noted   Polyphagia 10/01/2022   Prediabetes 10/01/2022   Vitamin D deficiency 10/01/2022   Class 3 severe obesity with serious comorbidity and body mass index (BMI) of 50.0 to 59.9 in adult 10/01/2022   Iron deficiency anemia    Gastric polyps    Episodic cluster headache, not intractable 10/17/2019   Hyperglycemia 11/16/2018   Need for HPV vaccination 11/16/2018   Diarrhea 12/20/2017   High risk homosexual behavior 05/19/2017   Myopia of both eyes 12/30/2016   Primary osteoarthritis involving multiple joints 07/06/2014   Hyperlipidemia LDL goal <130 10/10/2009   Morbid obesity (HCC) 07/19/2008   Mild persistent asthma 07/19/2008   GAD (generalized anxiety disorder) 09/13/2007   Essential hypertension 09/13/2007   GERD (gastroesophageal reflux disease) 09/13/2007    Past Surgical History:  Procedure Laterality Date   ANKLE SURGERY  10/12/2005   APPENDECTOMY  10/13/2003   BIOPSY  06/02/2021   Procedure: BIOPSY;  Surgeon: Iva Boop, MD;  Location: [facility];  Service: Endoscopy;;  EGD and COLON   COLONOSCOPY WITH PROPOFOL N/A 06/02/2021   Procedure: COLONOSCOPY WITH PROPOFOL;  Surgeon: Iva Boop, MD;  Location: [facility];  Service: Endoscopy;  Laterality: N/A;   ESOPHAGOGASTRODUODENOSCOPY (EGD) WITH PROPOFOL  N/A 06/02/2021   Procedure: ESOPHAGOGASTRODUODENOSCOPY (EGD) WITH PROPOFOL;  Surgeon: Iva Boop, MD;  Location: [facility];  Service: Endoscopy;  Laterality: N/A;   IR GENERIC HISTORICAL  07/23/2016   IR FLUORO GUIDED NEEDLE PLC ASPIRATION/INJECTION LOC 07/23/2016 Oley Balm, MD [facility]   LASIK Bilateral 2018   SKIN CANCER EXCISION     teenager   TEE WITHOUT CARDIOVERSION N/A 04/15/2016   Procedure: TRANSESOPHAGEAL ECHOCARDIOGRAM (TEE);  Surgeon: Thurmon Fair, MD;  Location: [facility];  Service: Cardiovascular;  Laterality: N/A;       Home Medications    Prior to  Admission medications   Medication Sig Start Date End Date Taking? Authorizing Provider  ALPRAZolam Prudy Feeler) 1 MG tablet Take 1 tablet (1 mg total) by mouth 2 (two) times daily as needed for anxiety. 02/02/18   Etta Grandchild, MD  buPROPion (WELLBUTRIN XL) 150 MG 24 hr tablet Take 150 mg by mouth daily.    [provider]  cetirizine (ZYRTEC) 10 MG tablet 1 tablet    [provider]  EPINEPHrine 0.3 mg/0.3 mL IJ SOAJ injection Inject into the muscle once.    [provider]  famotidine (PEPCID) 20 MG tablet Take 1 tablet (20 mg total) by mouth 2 (two) times daily. 04/28/22   Redwine, Madison A, PA-C  Ferrous Sulfate (IRON) 325 (65 Fe) MG TABS Take 1 tablet (325 mg total) by mouth daily. 06/17/21   Iva Boop, MD  furosemide (LASIX) 20 MG tablet Take 1 tablet (20 mg total) by mouth daily. Patient taking differently: Take 20 mg by mouth daily as needed. 06/02/18   Etta Grandchild, MD  Galcanezumab-gnlm (EMGALITY) 120 MG/ML SOAJ Inject 120 mg into the skin every 30 (thirty) days. 09/23/22   Anson Fret, MD  irbesartan-hydrochlorothiazide (AVALIDE) 300-12.5 MG tablet TAKE 1 TABLET BY MOUTH EVERY DAY NEEDS APPT FOR REFILLS 11/24/18   Etta Grandchild, MD  Magnesium 100 MG CAPS Take 100 mg by mouth.    [provider]  metFORMIN (GLUCOPHAGE) 500 MG tablet Take 1 tablet (500 mg total) by mouth daily with breakfast. 01/05/23   Langston Reusing, MD  omeprazole (PRILOSEC) 40 MG capsule TAKE 1 CAPSULE BY MOUTH TWICE A DAY 04/20/18   Etta Grandchild, MD  ondansetron (ZOFRAN-ODT) 4 MG disintegrating tablet Take 1-2 tablets (4-8 mg total) by mouth every 8 (eight) hours as needed. 09/23/22   Anson Fret, MD  Phentermine HCl (LOMAIRA) 8 MG TABS Take 1.5 tablets (12 mg total) by mouth daily. 01/05/23   Langston Reusing, MD  potassium chloride (KLOR-CON) 8 MEQ tablet Take 8 mEq by mouth daily.    [provider]  PROAIR HFA 108 (90 Base) MCG/ACT inhaler  INHALE 1-2 PUFFS EVERY 4-6 HOURS AS NEEDED FOR PERSISTENT COUGH OR INCREASED WORK OF BREATHING 09/08/16   [provider]  rizatriptan (MAXALT-MLT) 10 MG disintegrating tablet Take 1 tablet (10 mg total) by mouth as needed for migraine. May repeat in 2 hours if needed 09/23/22   Anson Fret, MD  topiramate (TOPAMAX) 25 MG tablet Take 3 tablets (75 mg total) by mouth daily. 01/05/23   Langston Reusing, MD  ursodiol (ACTIGALL) 300 MG capsule Take 300 mg by mouth 2 (two) times daily.    [provider]  verapamil (VERELAN PM) 240 MG 24 hr capsule TAKE 1 CAPSULE BY MOUTH DAILY 10/26/22   Anson Fret, MD  Vitamin D, Ergocalciferol, (DRISDOL) 1.25 MG (50000 UNIT) CAPS  capsule Take 1 capsule (50,000 Units total) by mouth every 7 (seven) days. 01/05/23   Langston Reusing, MD    Family History Family History  Problem Relation Age of Onset   Depression Mother    Cancer Mother    Thyroid disease Mother    Heart disease Mother    Hyperlipidemia Mother    Hypertension Mother    Asthma Mother    Clotting disorder Mother        Factor 5   Rheum arthritis Mother    Skin cancer Mother    Heart attack Mother    Migraines Mother    Kidney disease Mother    Anxiety disorder Mother    Bipolar disorder Mother    Obesity Mother    Obesity Father    Depression Father    Anxiety disorder Father    Hyperlipidemia Father    Hypertension Father    Diabetes Father        type 2    Liver disease Father    Sleep apnea Father    Emphysema Maternal Grandmother        smoker   Asthma Maternal Grandmother    Lung cancer Maternal Grandmother    Colon polyps Paternal Grandmother    Asthma Paternal Grandmother    Diabetes Paternal Grandmother        type 2   Clotting disorder Maternal Uncle        Factor 5   Allergies Other        whole family-both sides of family   Heart disease Other        MGM deceased with MI; both sides of grandparents    Social History Social  History   Tobacco Use   Smoking status: Never   Smokeless tobacco: Never  Vaping Use   Vaping Use: Never used  Substance Use Topics   Alcohol use: No    Comment: socially   Drug use: No     Allergies   Citrus, Amoxicillin-pot clavulanate, Sulfamethoxazole-trimethoprim, and Vancomycin   Review of Systems Review of Systems   Physical Exam Triage Vital Signs ED Triage Vitals  Enc Vitals Group     BP 01/28/23 1603 (!) 138/91     Pulse Rate 01/28/23 1603 (!) 102     Resp 01/28/23 1603 18     Temp 01/28/23 1603 98.2 F (36.8 C)     Temp Source 01/28/23 1603 Oral     SpO2 01/28/23 1603 97 %     Weight --      Height --      Head Circumference --      Peak Flow --      Pain Score 01/28/23 1607 2     Pain Loc --      Pain Edu? --      Excl. in GC? --    No data found.  Updated Vital Signs BP (!) 138/91 (BP Location: Left Arm)   Pulse (!) 102   Temp 98.2 F (36.8 C) (Oral)   Resp 18   SpO2 97%   Visual Acuity Right Eye Distance:   Left Eye Distance:   Bilateral Distance:    Right Eye Near:   Left Eye Near:    Bilateral Near:     Physical Exam Vitals reviewed.  Constitutional:      Appearance: He is well-developed.  HENT:     Mouth/Throat:     Mouth: Mucous membranes are moist.     Tongue:  No lesions.     Pharynx: Posterior oropharyngeal erythema present. No oropharyngeal exudate.     Tonsils: 2+ on the right. 2+ on the left.  Cardiovascular:     Rate and Rhythm: Normal rate and regular rhythm.     Heart sounds: Normal heart sounds.  Pulmonary:     Effort: Pulmonary effort is normal.     Breath sounds: Normal breath sounds.  Skin:    General: Skin is warm and dry.  Neurological:     General: No focal deficit present.     Mental Status: He is alert and oriented to person, place, and time.  Psychiatric:        Mood and Affect: Mood normal.        Behavior: Behavior normal.      UC Treatments / Results  Labs (all labs ordered are listed,  but only abnormal results are displayed) Labs Reviewed - No data to display  EKG   Radiology No results found.  Procedures Procedures (including critical care time)  Medications Ordered in UC Medications - No data to display  Initial Impression / Assessment and Plan / UC Course  I have reviewed the triage vital signs and the nursing notes.  Pertinent labs & imaging results that were available during my care of the patient were reviewed by me and considered in my medical decision making (see chart for details).   Alexis [name] is a 37 y.o. male presenting with sore throat and oral mucosa. Patient is afebrile without recent antipyretics, satting well on room air. Overall is well appearing and non-toxic, well hydrated, without respiratory distress. Pulmonary exam is unremarkable.  Lungs CTAB without wheezing, rhonchi, rales.  Pharyngeal erythema is present.  No peritonsillar exudates.  Tonsils are 2+ bilaterally.  Discussed differential diagnoses with the patient.  Possible new viral infection as the cause of his symptoms however cannot rule out recurrent bacterial sinusitis, also unresolved sinus inflammation following Doxy treatment.  Will treat the patient for recurrent sinusitis with cefdinir and clindamycin.  Will also prescribe anti-inflammatory therapy using prednisone.  Patient will follow-up with ENT next week.  Counseled patient on potential for adverse effects with medications prescribed/recommended today, ER and return-to-clinic precautions discussed, patient verbalized understanding and agreement with care plan.   Final Clinical Impressions(s) / UC Diagnoses   Final diagnoses:  None   Discharge Instructions   None    ED Prescriptions   None    PDMP not reviewed this encounter.   Charma Igo, [place] 01/28/23 1644

## 2023-01-28 NOTE — Discharge Instructions (Addendum)
Please STOP using OTC mouthwash product.  START using prescribed mouthwash.  Take both prescribed antibiotics, with food.

## 2023-02-02 DIAGNOSIS — J324 Chronic pansinusitis: Secondary | ICD-10-CM | POA: Diagnosis not present

## 2023-02-02 DIAGNOSIS — J320 Chronic maxillary sinusitis: Secondary | ICD-10-CM | POA: Diagnosis not present

## 2023-02-02 DIAGNOSIS — J302 Other seasonal allergic rhinitis: Secondary | ICD-10-CM | POA: Diagnosis not present

## 2023-02-08 ENCOUNTER — Other Ambulatory Visit: Payer: Self-pay | Admitting: Neurology

## 2023-02-08 DIAGNOSIS — G44019 Episodic cluster headache, not intractable: Secondary | ICD-10-CM

## 2023-02-08 DIAGNOSIS — G43709 Chronic migraine without aura, not intractable, without status migrainosus: Secondary | ICD-10-CM

## 2023-02-08 DIAGNOSIS — I10 Essential (primary) hypertension: Secondary | ICD-10-CM

## 2023-02-08 DIAGNOSIS — G43109 Migraine with aura, not intractable, without status migrainosus: Secondary | ICD-10-CM

## 2023-02-09 ENCOUNTER — Ambulatory Visit (INDEPENDENT_AMBULATORY_CARE_PROVIDER_SITE_OTHER): Payer: BC Managed Care – PPO | Admitting: Family Medicine

## 2023-02-09 ENCOUNTER — Encounter (INDEPENDENT_AMBULATORY_CARE_PROVIDER_SITE_OTHER): Payer: Self-pay | Admitting: Family Medicine

## 2023-02-09 VITALS — BP 116/81 | HR 106 | Temp 98.9°F | Ht 76.0 in | Wt >= 6400 oz

## 2023-02-09 DIAGNOSIS — E669 Obesity, unspecified: Secondary | ICD-10-CM | POA: Diagnosis not present

## 2023-02-09 DIAGNOSIS — Z6841 Body Mass Index (BMI) 40.0 and over, adult: Secondary | ICD-10-CM | POA: Diagnosis not present

## 2023-02-09 DIAGNOSIS — R632 Polyphagia: Secondary | ICD-10-CM

## 2023-02-09 DIAGNOSIS — R7303 Prediabetes: Secondary | ICD-10-CM

## 2023-02-09 MED ORDER — METFORMIN HCL 500 MG PO TABS: 500.0000 mg | ORAL_TABLET | Freq: Every day | ORAL | 0 refills | Status: DC

## 2023-02-09 MED ORDER — TOPIRAMATE 25 MG PO TABS: 75.0000 mg | ORAL_TABLET | Freq: Every day | ORAL | 0 refills | Status: DC

## 2023-02-09 MED ORDER — LOMAIRA 8 MG PO TABS: 12.0000 mg | ORAL_TABLET | Freq: Every day | ORAL | 0 refills | Status: DC

## 2023-02-09 NOTE — Progress Notes (Unsigned)
Chief Complaint:   OBESITY Alexis [name] is here to discuss his progress with his obesity treatment plan along with follow-up of his obesity related diagnoses. Alexis [name] is on the Category 4 Plan and states he is following his eating plan approximately 85% of the time. Alexis [name] states he is not exercising.   Today's visit was #: 17 Starting weight: 477 lbs Starting date: 12/02/2021 Today's weight: 444 lbs Today's date: 02/09/2023 Total lbs lost to date: 33 lbs Total lbs lost since last in-office visit: 5 lbs  Interim History: Patient really hasn't felt well since last appointment.  He was on antibiotics, and steroid and had smell changes and some other nondescript symptoms.  Went on overnight trip to [place] and the change in pressure was very difficult on his internal ear pressure.  Going Tuesday (1 week from today) for allergy testing. CT scan is tomorrow. He has been able to stick to meal plan fairly strictly.  No hunger; getting most of the food most days but with not feeling well has not always gotten everything in.  Subjective:   1. Prediabetes Patient is on metformin daily.  Last A1c was 5.7 in early March.  Insulin level 64.4.  2. Polyphagia Patient is on combination phentermine/topiramate.  Patient started on 08/26/2022 at 469 LBS.  Patient needed to lose 23 LBS, now down 25 LBS.  Assessment/Plan:   1. Prediabetes Refill- metFORMIN (GLUCOPHAGE) 500 MG tablet; Take 1 tablet (500 mg total) by mouth daily with breakfast.  Dispense: 30 tablet; Refill: 0  2. Polyphagia Refill- Phentermine HCl (LOMAIRA) 8 MG TABS; Take 1.5 tablets (12 mg total) by mouth daily.  Dispense: 45 tablet; Refill: 0  Refill- topiramate (TOPAMAX) 25 MG tablet; Take 3 tablets (75 mg total) by mouth daily.  Dispense: 90 tablet; Refill: 0  3. BMI 50.0-59.9, adult (HCC)  4. Obesity with starting BMI of 58.0 Alexis [name] is currently in the action stage of change. As such, his goal is to continue with weight loss  efforts. He has agreed to the Category 4 Plan.   Exercise goals: All adults should avoid inactivity. Some physical activity is better than none, and adults who participate in any amount of physical activity gain some health benefits.  Behavioral modification strategies: increasing lean protein intake, meal planning and cooking strategies, keeping healthy foods in the home, and planning for success.  Alexis [name] has agreed to follow-up with our clinic in 3-4 weeks. He was informed of the importance of frequent follow-up visits to maximize his success with intensive lifestyle modifications for his multiple health conditions.   Objective:   Blood pressure 116/81, pulse (!) 106, temperature 98.9 F (37.2 C), height 6\' 4"  (1.93 m), weight (!) 444 lb (201.4 kg), SpO2 100 %. Body mass index is 54.05 kg/m.  General: Cooperative, alert, well developed, in no acute distress. HEENT: Conjunctivae and lids unremarkable. Cardiovascular: Regular rhythm.  Lungs: Normal work of breathing. Neurologic: No focal deficits.   Lab Results  Component Value Date   CREATININE 1.23 12/16/2022   BUN 12 12/16/2022   NA 138 12/16/2022   K 3.2 (L) 12/16/2022   CL 99 12/16/2022   CO2 24 12/16/2022   Lab Results  Component Value Date   ALT 28 12/16/2022   AST 19 12/16/2022   ALKPHOS 71 12/16/2022   BILITOT 0.9 12/16/2022   Lab Results  Component Value Date   HGBA1C 5.7 (H) 12/16/2022   HGBA1C 5.8 (H) 12/02/2021   HGBA1C 5.4 11/18/2018   HGBA1C  5.5 03/08/2007   Lab Results  Component Value Date   INSULIN 64.4 (H) 12/16/2022   INSULIN 34.5 (H) 12/02/2021   Lab Results  Component Value Date   TSH 2.930 12/02/2021   Lab Results  Component Value Date   CHOL 167 12/16/2022   HDL 44 12/16/2022   LDLCALC 103 (H) 12/16/2022   TRIG 107 12/16/2022   CHOLHDL 4 02/02/2018   Lab Results  Component Value Date   VD25OH 37.8 12/16/2022   VD25OH 17.7 (L) 12/02/2021   Lab Results  Component Value Date    WBC 9.1 12/16/2022   HGB 14.5 12/16/2022   HCT 45.0 12/16/2022   MCV 78 (L) 12/16/2022   PLT 275 12/16/2022   Lab Results  Component Value Date   IRON 39 12/16/2022   TIBC 270 12/16/2022   FERRITIN 100 12/16/2022   Attestation Statements:   Reviewed by clinician on day of visit: allergies, medications, problem list, medical history, surgical history, family history, social history, and previous encounter notes.  I, Malcolm Metro, RMA, am acting as transcriptionist for Reuben Likes, MD.  I have reviewed the above documentation for accuracy and completeness, and I agree with the above. - Reuben Likes, MD

## 2023-02-10 DIAGNOSIS — R0981 Nasal congestion: Secondary | ICD-10-CM | POA: Diagnosis not present

## 2023-02-16 DIAGNOSIS — J301 Allergic rhinitis due to pollen: Secondary | ICD-10-CM | POA: Diagnosis not present

## 2023-03-11 ENCOUNTER — Encounter (INDEPENDENT_AMBULATORY_CARE_PROVIDER_SITE_OTHER): Payer: Self-pay | Admitting: Family Medicine

## 2023-03-11 ENCOUNTER — Ambulatory Visit (INDEPENDENT_AMBULATORY_CARE_PROVIDER_SITE_OTHER): Payer: BC Managed Care – PPO | Admitting: Family Medicine

## 2023-03-11 VITALS — BP 124/84 | HR 99 | Temp 98.7°F | Ht 76.0 in | Wt >= 6400 oz

## 2023-03-11 DIAGNOSIS — E559 Vitamin D deficiency, unspecified: Secondary | ICD-10-CM

## 2023-03-11 DIAGNOSIS — R7303 Prediabetes: Secondary | ICD-10-CM

## 2023-03-11 DIAGNOSIS — E669 Obesity, unspecified: Secondary | ICD-10-CM | POA: Diagnosis not present

## 2023-03-11 DIAGNOSIS — Z6841 Body Mass Index (BMI) 40.0 and over, adult: Secondary | ICD-10-CM

## 2023-03-11 DIAGNOSIS — R632 Polyphagia: Secondary | ICD-10-CM | POA: Diagnosis not present

## 2023-03-11 MED ORDER — METFORMIN HCL 500 MG PO TABS: 500.0000 mg | ORAL_TABLET | Freq: Every day | ORAL | 0 refills | Status: DC

## 2023-03-11 MED ORDER — VITAMIN D (ERGOCALCIFEROL) 1.25 MG (50000 UNIT) PO CAPS: 50000.0000 [IU] | ORAL_CAPSULE | ORAL | 0 refills | Status: DC

## 2023-03-11 MED ORDER — LOMAIRA 8 MG PO TABS: 12.0000 mg | ORAL_TABLET | Freq: Every day | ORAL | 0 refills | Status: DC

## 2023-03-11 MED ORDER — TOPIRAMATE 25 MG PO TABS: 75.0000 mg | ORAL_TABLET | Freq: Every day | ORAL | 0 refills | Status: DC

## 2023-03-11 NOTE — Progress Notes (Signed)
Chief Complaint:   OBESITY Alexis [name] is here to discuss his progress with his obesity treatment plan along with follow-up of his obesity related diagnoses. Alexis [name] is on the Category 4 Plan and states he is following his eating plan approximately 85-90% of the time. Alexis [name] states he is walking for 15 to 20 minutes 2 times per week.  Today's visit was #: 18 Starting weight: 477 LBS Starting date: 12/02/2021 Today's weight: 445 LBS Today's date: 03/11/2023 Total lbs lost to date: 32 LBS Total lbs lost since last in-office visit: +1 LB  Interim History: Patient had a busier Memorial Day weekend.  He has been following meal plan 85-90%. He is just trying to make sure he is getting his protein in daily and following Category plan.  For snack calories he is not snacking much.  He will eat a larger meal  and still working on getting all protein in. He occasionally opts for higher protein Fairlife drink.  He is experiencing some edema of the lower extremities today.   Subjective:   1. Vitamin D deficiency Patient is on prescription Vitamin D.  Patient denies nausea, vomiting, muscle weakness, but is positive for fatigue.  2. Polyphagia Patient is on combination Lomaira/topiramate 12/75.  PDMP checked today no concerns.  3. Prediabetes Patient is on metformin daily.  No GI side effects.  Assessment/Plan:   1. Vitamin D deficiency Refill- Vitamin D, Ergocalciferol, (DRISDOL) 1.25 MG (50000 UNIT) CAPS capsule; Take 1 capsule (50,000 Units total) by mouth every 7 (seven) days.  Dispense: 4 capsule; Refill: 0  2. Polyphagia Refill- topiramate (TOPAMAX) 25 MG tablet; Take 3 tablets (75 mg total) by mouth daily.  Dispense: 90 tablet; Refill: 0  Refill- Phentermine HCl (LOMAIRA) 8 MG TABS; Take 1.5 tablets (12 mg total) by mouth daily.  Dispense: 45 tablet; Refill: 0  3. Prediabetes Refill- metFORMIN (GLUCOPHAGE) 500 MG tablet; Take 1 tablet (500 mg total) by mouth daily with breakfast.   Dispense: 30 tablet; Refill: 0  4. BMI 50.0-59.9, adult (HCC)  5. Obesity with starting BMI of 58.0 Alexis [name] is currently in the action stage of change. As such, his goal is to continue with weight loss efforts. He has agreed to the Category 4 Plan.   Exercise goals:  Patient to be consistent with 3-4 times a week of 10 to 15 minutes active exercise.  Behavioral modification strategies: increasing lean protein intake, meal planning and cooking strategies, keeping healthy foods in the home, and planning for success.  Alexis [name] has agreed to follow-up with our clinic in 4 weeks. He was informed of the importance of frequent follow-up visits to maximize his success with intensive lifestyle modifications for his multiple health conditions.   Objective:   Blood pressure 124/84, pulse 99, temperature 98.7 F (37.1 C), height 6\' 4"  (1.93 m), weight (!) 445 lb (201.9 kg), SpO2 100 %. Body mass index is 54.17 kg/m.  General: Cooperative, alert, well developed, in no acute distress. HEENT: Conjunctivae and lids unremarkable. Cardiovascular: Regular rhythm.  Lungs: Normal work of breathing. Neurologic: No focal deficits.   Lab Results  Component Value Date   CREATININE 1.23 12/16/2022   BUN 12 12/16/2022   NA 138 12/16/2022   K 3.2 (L) 12/16/2022   CL 99 12/16/2022   CO2 24 12/16/2022   Lab Results  Component Value Date   ALT 28 12/16/2022   AST 19 12/16/2022   ALKPHOS 71 12/16/2022   BILITOT 0.9 12/16/2022   Lab Results  Component Value Date   HGBA1C 5.7 (H) 12/16/2022   HGBA1C 5.8 (H) 12/02/2021   HGBA1C 5.4 11/18/2018   HGBA1C 5.5 03/08/2007   Lab Results  Component Value Date   INSULIN 64.4 (H) 12/16/2022   INSULIN 34.5 (H) 12/02/2021   Lab Results  Component Value Date   TSH 2.930 12/02/2021   Lab Results  Component Value Date   CHOL 167 12/16/2022   HDL 44 12/16/2022   LDLCALC 103 (H) 12/16/2022   TRIG 107 12/16/2022   CHOLHDL 4 02/02/2018   Lab Results   Component Value Date   VD25OH 37.8 12/16/2022   VD25OH 17.7 (L) 12/02/2021   Lab Results  Component Value Date   WBC 9.1 12/16/2022   HGB 14.5 12/16/2022   HCT 45.0 12/16/2022   MCV 78 (L) 12/16/2022   PLT 275 12/16/2022   Lab Results  Component Value Date   IRON 39 12/16/2022   TIBC 270 12/16/2022   FERRITIN 100 12/16/2022   Attestation Statements:   Reviewed by clinician on day of visit: allergies, medications, problem list, medical history, surgical history, family history, social history, and previous encounter notes.  I, Malcolm Metro, RMA, am acting as transcriptionist for Reuben Likes, MD. I have reviewed the above documentation for accuracy and completeness, and I agree with the above. - Reuben Likes, MD

## 2023-03-15 ENCOUNTER — Telehealth (INDEPENDENT_AMBULATORY_CARE_PROVIDER_SITE_OTHER): Payer: BC Managed Care – PPO | Admitting: Neurology

## 2023-03-15 ENCOUNTER — Encounter: Payer: Self-pay | Admitting: Neurology

## 2023-03-15 ENCOUNTER — Telehealth: Payer: Self-pay | Admitting: Neurology

## 2023-03-15 DIAGNOSIS — G44019 Episodic cluster headache, not intractable: Secondary | ICD-10-CM | POA: Diagnosis not present

## 2023-03-15 DIAGNOSIS — G43109 Migraine with aura, not intractable, without status migrainosus: Secondary | ICD-10-CM | POA: Diagnosis not present

## 2023-03-15 DIAGNOSIS — I10 Essential (primary) hypertension: Secondary | ICD-10-CM

## 2023-03-15 DIAGNOSIS — G43709 Chronic migraine without aura, not intractable, without status migrainosus: Secondary | ICD-10-CM | POA: Diagnosis not present

## 2023-03-15 MED ORDER — NURTEC 75 MG PO TBDP: 75.0000 mg | ORAL_TABLET | Freq: Every day | ORAL | 11 refills | Status: DC | PRN

## 2023-03-15 MED ORDER — VERAPAMIL HCL ER 240 MG PO CP24: 240.0000 mg | ORAL_CAPSULE | Freq: Every day | ORAL | 4 refills | Status: DC

## 2023-03-15 MED ORDER — RIZATRIPTAN BENZOATE 10 MG PO TBDP: 10.0000 mg | ORAL_TABLET | ORAL | 11 refills | Status: DC | PRN

## 2023-03-15 MED ORDER — EMGALITY 120 MG/ML ~~LOC~~ SOAJ: 120.0000 mg | SUBCUTANEOUS | 11 refills | Status: DC

## 2023-03-15 NOTE — Telephone Encounter (Signed)
Please schedule appt in December for migraine follow up can be vuideo

## 2023-03-15 NOTE — Telephone Encounter (Signed)
Scheduled for 09/20/23 at 9:30 am as a video visit.

## 2023-03-15 NOTE — Progress Notes (Signed)
[name]    Provider:  Dr Lucia Gaskins Referring Provider: Jarrett Soho, PA-C Primary Care Physician:  Alexis Soho, PA-C   CC:  Headache  Virtual Visit via Video Note  I connected with Alexis [name] on 03/15/23 at  9:30 AM EDT by a video enabled telemedicine application and verified that I am speaking with the correct person using two identifiers.  Location: Patient: home Provider: office   I discussed the limitations of evaluation and management by telemedicine and the availability of in person appointments. The patient expressed understanding and agreed to proceed.  Follow Up Instructions:    I discussed the assessment and treatment plan with the patient. The patient was provided an opportunity to ask questions and all were answered. The patient agreed with the plan and demonstrated an understanding of the instructions.   The patient was advised to call back or seek an in-person evaluation if the symptoms worsen or if the condition fails to improve as anticipated.  I provided 23 minutes of non-face-to-face time during this encounter.   Anson Fret, MD  03/15/2023: He is on Topamax and phentermine and he [facility]. He has been doing the emgality and loves it. He does one. He is approved. He only has 4 migraines a month. Prior to [employer] he had 12 migraines a month and > 15 headache days a month for the last year. On emgality only a month, less severe, easier to treat, no headaches. Tried maxalt, imitrex, we filled out the nurtec copay card, discussed using as acute, he had tried and failed triptans. No other focal neurologic deficits, associated symptoms, inciting events or modifiable factors.  Patient complains of symptoms per HPI as well as the following symptoms: none . Pertinent negatives and positives per HPI. 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 He took Ajovy and couldn;t tell if it helped. Discussed Emgality is approved for migraines and cluster headaches. He was just started on Topiramate again and Dr. Lawson Radar can titrate or we can. He tried Ajovy. He has migraines and cluster headaches. He has light sensitivity, pulsating/pounding/throbbing, nausea, more on the right, advil helps but doesn't take it away, can abort in an hour without medicatio can last upwards of 24-48 hours, sleep helps. 8-12 migraines a month and > 15 headache days a month for the last year. Doesn't feel the Ajovy helped. Botox is an option. Try emgality. No other focal neurologic deficits, associated symptoms, inciting events or modifiable factors.   Patient complains of symptoms per HPI as well as the following symptoms: migraines . Pertinent negatives and positives per HPI. All others negative   HPI 05/28/2021: His headaches get worse with stress, he has changed jobs, he had a sleep study several years and was borderline, headaches not predominantly in the mornng, more at the end of the day, clusters have not really come back, still on the verapamil, stopped the ajovy but that helped tremendously and when he was on that he would have 1-2 migraines a month that would be very manageable. Without the ajovy he is taking advil. He can have 7-8/10 in pain and 15 migraine days a month for over 6 months since stopping the Ajovy we will reinitiate Ajovy. Nurtec did not work.   HPI 10/16/2019: This is a patient referred from Dr. Yetta Barre for migraines and cluster headaches.  I have not seen him for over  3 years.  He has a long history of migraines.  Work-up in the past included MRI of the brain, MRV, lumbar puncture for evaluation of intracranial idiopathic hypertension or other causes of headache and was negative.  He has a family history of migraines in his mother.  In the past he was on Topamax but since last I saw him he is not on topamax. 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 He was doing well until this year only having one migraine a month. Over the last 3 months since a sinus infection he is having more headaches, only on the left side, severe pain, daily, he had tearing of the eye, severe jabs, can last up to an hour, a few times a day, he wakes up with it.  They can happen several times a day.  They are severe, he paces, he is tried multiple acute management medications such as over-the-counter Tylenol, Benadryl, ibuprofen and this does not help.  He is not sure of the sinus infection had anything to do with precipitating this.  He has had these in the past, last time was several years ago, very consistent with cluster headaches.  We discussed the difference between cluster headaches and migraines.  I do feel as though he has both and there is higher incidence and migraine auras with other kinds of headaches.  Cluster headaches are also more common in males. No other focal neurologic deficits, associated symptoms, inciting events or modifiable factors.  Interval update 06/09/2016: 37 year old male with a past medical history of migraines and morbid obesity here for vision changes, possible papilledema seen on ophthalmology examination recently. [place] and his vision went blurry, very fuzzy and blurry for about 4-5 minutes.  Happened 3 times, it was significant. Both eyes. Happened again the next day. It was not an aura, it was blurry. Went away eventually, blinking eyes. He continues to have episodes of significant vision changes. He is having 1-2 significant migraine a month. He has had episodes of feeling like his head is going to explode, severe pressure all around the head with 10 out of 10 pain. Not worse with laying down, he has noticed some new hearing changes, more of a ringing. Discussion with patient and his mother. Symptoms are concerning for intracranial hypertension. Need to rule out tumors or  masses causing the increased pressure as well as other intracranial etiologies. Discussed all the imaging needed as well as lumbar puncture and reasons why. Also discussed possible findings and how we would treat them. If consistent with idiopathic intracranial hypertension would likely start on Diamox. Patient has been on doxycycline recently for multiple infections and other medications as well, he recently spent 14 days in the hospital for infections and sepsis. No history of high doses of vitamin A use or other medications that are associated with pseudotumor cerebri. Discussed that weight loss is key in pseudotumor cerebri. Also discussed that untreated can cause permanent vision loss. She does vision or headaches worsen he is to proceed to the emergency room immediately.   Interval Update 02/27/2016:  He ws at work. Last Wednesday. He had acute onset of left eye vision loss. Then had a headache and lasted all day. Would not go away. Typical headache migraines, left side, throbbing, light sensitivity, sound sensitivity. He has a lot of light and sound sensitivity. No nausea or vomiting with the headaches. He has 4-5 headache days a month. Not  typically any are migrainous. Had taken imitrex orally in the past. Migraines can be quick. He wakes up with a headache. He had a sleep study and he does not have sleep apnea. Did not need a cpap. They are not serious, they tend to go away quickly. Not often does he have the headaches. He is on Topamax and Verapamil and doing well.    Initial visit 11/14/2014 :  Alexis [name] is a 37 y.o. male here as a referral from Dr. Yetta Barre for headache.    Migraines are well controlled if he takes the indomethacin. If he falls asleep and forgets the indomethacin on the couch he will wake up with a headache. He will wake up at 4am with a headache. He is on Verapamil and has not missed a dose. He does not have a lot of nausea with the migraines.  No signs of OSA.    He is on  Wellbutrin. He says that he has been well on Wellbutrin for many years but he is unsure if it is helping him anymore. He has decreased energy and decreased concentration and he has gained weight and that makes him depressed.   Discussed that people with headaches often have decreased serotonin levels and that if location is not helping maybe we should change to an SSRI.       11/14/2014: Patient is here as an add on for new onset severe headache. PMHx of occular migraines. He is on 120mg  verapamil and completely controlled. Friday night they were at the theatre. The most he laughed the pressure was building in his head. It is on the left side. Throbbing is constant. He also feels stabbing in the brain in the temporal area. He has constant pain Extremely painful. The pain was so bad that his BP shot up when he was on telemetry in the ED. The pain is constant but then he has periods of elevated pain in the head, the pain stabs and holds for a minute. The worst pain he has ever felt in his life. Severe 10/10. Eye waters, keeps getting hot flashes, clammy feeling. +light sensitivity. It has been continuous. No weakness, vision. Has some pulsatile hearing changes. He has been sick recently and has a sore throat and a sinus infection.   Reviewed notes, labs and imaging from outside physicians, which showed: MRI brain w/wo contrast 04/2012 showed no acute intracranial abnormalities including mass lesion or mass effect, hydrocephalus, extra-axial fluid collection, midline shift, hemorrhage, or acute infarction, large ischemic events (personally reviewed images). BMP/CMP unremarkable.    Review of Systems: Patient complains of symptoms per HPI as well as the following symptoms: headache, weight gain. Pertinent negatives per HPI. All others negative.  Social History   Socioeconomic History   Marital status: Single    Spouse name: Not on file   Number of children: 0   Years of education: College   Highest  education level: Not on file  Occupational History   Occupation: Futures trader: [employer]   Occupation: EMT   Occupation: Print production planner  Tobacco Use   Smoking status: Never   Smokeless tobacco: Never  Building services engineer Use: Never used  Substance and Sexual Activity   Alcohol use: No    Comment: socially   Drug use: No   Sexual activity: Yes  Other Topics Concern   Not on file  Social History Narrative   Lives at home with parents.   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   Caffeine use: 3-4 cups daily.    Social Determinants of Health   Financial Resource Strain: Not on file  Food Insecurity: Not on file  Transportation Needs: Not on file  Physical Activity: Not on file  Stress: Not on file  Social Connections: Not on file  Intimate Partner Violence: Not on file    Family History  Problem Relation Age of Onset   Depression Mother    Cancer Mother    Thyroid disease Mother    Heart disease Mother    Hyperlipidemia Mother    Hypertension Mother    Asthma Mother    Clotting disorder Mother        Factor 5   Rheum arthritis Mother    Skin cancer Mother    Heart attack Mother    Migraines Mother    Kidney disease Mother    Anxiety disorder Mother    Bipolar disorder Mother    Obesity Mother    Obesity Father    Depression Father    Anxiety disorder Father    Hyperlipidemia Father    Hypertension Father    Diabetes Father        type 2    Liver disease Father    Sleep apnea Father    Emphysema Maternal Grandmother        smoker   Asthma Maternal Grandmother    Lung cancer Maternal Grandmother    Colon polyps Paternal Grandmother    Asthma Paternal Grandmother    Diabetes Paternal Grandmother        type 2   Clotting disorder Maternal Uncle        Factor 5   Allergies Other        whole family-both sides of family   Heart disease Other        MGM deceased  with MI; both sides of grandparents    Past Medical History:  Diagnosis Date   Acid reflux    ADD 09/13/2007   ALLERGIC RHINITIS 06/10/2009   Anemia    Anxiety    ASTHMA, UNSPECIFIED, UNSPECIFIED STATUS 07/19/2008   Back pain    Bacterial sinusitis    Bilateral swelling of feet    C. difficile diarrhea    Dental abscess    caused sinusitis   DEPRESSIVE DISORDER 09/13/2007   DYSMETABOLIC SYNDROME 10/25/2009   Gallbladder problem    GERD 09/13/2007   Headache    HYPERLIPIDEMIA 10/10/2009   HYPERTENSION 09/13/2007   IBS 03/20/2009   IBS (irritable bowel syndrome)    Joint pain    Morbid obesity (HCC) 07/19/2008   Ocular migraine    Palpitations    Prediabetes    Skin cancer    SOB (shortness of breath)    Vitamin D deficiency     Past Surgical History:  Procedure Laterality Date   ANKLE SURGERY  10/12/2005   APPENDECTOMY  10/13/2003   BIOPSY  06/02/2021   Procedure: BIOPSY;  Surgeon: Iva Boop, MD;  Location: [facility];  Service: Endoscopy;;  EGD and COLON   COLONOSCOPY WITH PROPOFOL N/A 06/02/2021   Procedure: COLONOSCOPY WITH PROPOFOL;  Surgeon: Iva Boop, MD;  Location: [facility];  Service: Endoscopy;  Laterality: N/A;   ESOPHAGOGASTRODUODENOSCOPY (EGD) WITH PROPOFOL N/A 06/02/2021   Procedure: ESOPHAGOGASTRODUODENOSCOPY (EGD) WITH PROPOFOL;  Surgeon: Iva Boop, MD;  Location: [facility];  Service: Endoscopy;  Laterality: N/A;   IR GENERIC HISTORICAL  07/23/2016   IR FLUORO GUIDED NEEDLE PLC ASPIRATION/INJECTION LOC 07/23/2016 Oley Balm, MD [facility]   LASIK Bilateral 2018   SKIN CANCER EXCISION     teenager   TEE WITHOUT CARDIOVERSION N/A 04/15/2016   Procedure: TRANSESOPHAGEAL ECHOCARDIOGRAM (TEE);  Surgeon: Thurmon Fair, MD;  Location: [facility] ENDOSCOPY;  Service: Cardiovascular;  Laterality: N/A;    Current Outpatient Medications  Medication Sig Dispense Refill   Rimegepant Sulfate (NURTEC) 75 MG TBDP Take 1 tablet (75 mg  total) by mouth daily as needed. For migraines. Take as close to onset of migraine as possible. One daily maximum. PLEASE RUN COPAY CARD BIN [id number] PCN CN group [id number] ID [id number] 16 tablet 11   ALPRAZolam (XANAX) 1 MG tablet Take 1 tablet (1 mg total) by mouth 2 (two) times daily as needed for anxiety. 30 tablet 3   buPROPion (WELLBUTRIN XL) 150 MG 24 hr tablet Take 150 mg by mouth daily.     cetirizine (ZYRTEC) 10 MG tablet 1 tablet     EPINEPHrine 0.3 mg/0.3 mL IJ SOAJ injection Inject into the muscle once.     famotidine (PEPCID) 20 MG tablet Take 1 tablet (20 mg total) by mouth 2 (two) times daily. 30 tablet 0   Ferrous Sulfate (IRON) 325 (65 Fe) MG TABS Take 1 tablet (325 mg total) by mouth daily. 30 tablet 0   furosemide (LASIX) 20 MG tablet Take 1 tablet (20 mg total) by mouth daily. (Patient taking differently: Take 20 mg by mouth daily as needed.) 30 tablet 3   Galcanezumab-gnlm (EMGALITY) 120 MG/ML SOAJ Inject 120 mg into the skin every 30 (thirty) days. 3 mL 11   irbesartan-hydrochlorothiazide (AVALIDE) 300-12.5 MG tablet TAKE 1 TABLET BY MOUTH EVERY DAY NEEDS APPT FOR REFILLS 90 tablet 1   magic mouthwash (lidocaine, diphenhydrAMINE, alum & mag hydroxide) suspension Swish and swallow 5 mLs 3 (three) times daily as needed for mouth pain. 360 mL 0   Magnesium 100 MG CAPS Take 100 mg by mouth.     metFORMIN (GLUCOPHAGE) 500 MG tablet Take 1 tablet (500 mg total) by mouth daily with breakfast. 30 tablet 0   omeprazole (PRILOSEC) 40 MG capsule TAKE 1 CAPSULE BY MOUTH TWICE A DAY 180 capsule 1   ondansetron (ZOFRAN-ODT) 4 MG disintegrating tablet Take 1-2 tablets (4-8 mg total) by mouth every 8 (eight) hours as needed. 30 tablet 3   Phentermine HCl (LOMAIRA) 8 MG TABS Take 1.5 tablets (12 mg total) by mouth daily. 45 tablet 0   potassium chloride (KLOR-CON) 8 MEQ tablet Take 8 mEq by mouth daily.     PROAIR HFA 108 (90 Base) MCG/ACT inhaler INHALE 1-2 PUFFS EVERY 4-6 HOURS AS NEEDED  FOR PERSISTENT COUGH OR INCREASED WORK OF BREATHING  0   rizatriptan (MAXALT-MLT) 10 MG disintegrating tablet Take 1 tablet (10 mg total) by mouth as needed for migraine. May repeat in 2 hours if needed 36 tablet 11   topiramate (TOPAMAX) 25 MG tablet Take 3 tablets (75 mg total) by mouth daily. 90 tablet 0   ursodiol (ACTIGALL) 300 MG capsule Take 300 mg by mouth 2 (two) times daily.     verapamil (VERELAN) 240 MG 24 hr capsule Take 1 capsule (240 mg total) by mouth daily. 90 capsule 4   Vitamin D, Ergocalciferol, (DRISDOL) 1.25 MG (50000 UNIT) CAPS capsule Take 1 capsule (50,000 Units total) by mouth every 7 (seven) days. 4 capsule 0   No  current facility-administered medications for this visit.    Allergies as of 03/15/2023 - Review Complete 03/11/2023  Allergen Reaction Noted   Citrus Anaphylaxis and Other (See Comments) 09/04/2012   Amoxicillin-pot clavulanate Nausea Only    Sulfamethoxazole-trimethoprim Other (See Comments)    Vancomycin Other (See Comments) 04/11/2016    Vitals: There were no vitals taken for this visit. Last Weight:  Wt Readings from Last 1 Encounters:  03/11/23 (!) 445 lb (201.9 kg)   Last Height:   Ht Readings from Last 1 Encounters:  03/11/23 6\' 4"  (1.93 m)    Physical exam: Exam: Gen: NAD, conversant      CV: Denies palpitations or chest pain or SOB. VS: Breathing at a normal rate. Weight appears within obesity. Not febrile. Eyes: Conjunctivae clear without exudates or hemorrhage  Neuro: Detailed Neurologic Exam  Speech:    Speech is normal; fluent and spontaneous with normal comprehension.  Cognition:    The patient is oriented to person, place, and time;     recent and remote memory intact;     language fluent;     normal attention, concentration,     fund of knowledge Cranial Nerves:    The pupils are equal, round, and reactive to light.  Visual fields are full to finger confrontation. Extraocular movements are intact.  The face is  symmetric with normal sensation. The palate elevates in the midline. Hearing intact. Voice is normal. Shoulder shrug is normal. The tongue has normal motion without fasciculations.   Coordination:    Normal finger to nose  Gait:    Normal native gait  Motor Observation:   no involuntary movements noted. Tone:    Appears normal  Posture:    Posture is normal. normal erect    Strength:    Strength is anti-gravity and symmetric in the upper and lower limbs.      Sensation: intact to LT      Assessment/Plan:  This is a lovely 37 year old male with a past medical history of chronic migraines treated with Topamax and multiple other medications, ocular migraines and cluster headaches treated with verapamil, cluster headaches also treated with verapamil.   03/15/2023: He is on Topamax and phentermine and he [facility]. He has been doing the emgality and loves it. He does one. He is approved. He only has 4 migraines a month. Prior to [employer] he had 12 migraines a month and > 15 headache days a month for the last year. On emgality only a month, less severe, easier to treat, no headaches. Tried maxalt, imitrex, we filled out the nurtec copay card, discussed using as acute, he had tried and failed triptans. No other focal neurologic deficits, associated symptoms, inciting events or modifiable factors.    - Migraine and cluster Prevention: Recommend titrating up the Topiramate - Dr. Luis Abed. STAY ON EMGALITY - Cluster headache prevention: Continue the Verapamil - 240mg  could increase to 360mg  but fearful of hypotension, this is a good medication for cluster headaches and also for blood pressure - Acute: Rizatriptan and/or Nurtec as soon as the headache starts, can take with ondansetron and repeat 2 hours later. Could also try imitrex injections or nasal spray. See how the rizatriptan and nurtec does and let us know if you need something faster.   Chronic migaines: 8-12 migraines a month and > 15 headache days a month for the last year. Doesn't feel the Ajovy helped. Botox is an option. Doing fantastic  on Emgality  Meds ordered this encounter  Medications   Rimegepant Sulfate (NURTEC) 75 MG TBDP    Sig: Take 1 tablet (75 mg total) by mouth daily as needed. For migraines. Take as close to onset of migraine as possible. One daily maximum. PLEASE RUN COPAY CARD BIN [id number] PCN CN group [id number] ID [id number]    Dispense:  16 tablet    Refill:  11    PLEASE RUN COPAY CARD BIN [id number] PCN CN group [id number] ID [id number]   Galcanezumab-gnlm (EMGALITY) 120 MG/ML SOAJ    Sig: Inject 120 mg into the skin every 30 (thirty) days.    Dispense:  3 mL    Refill:  11    Dispense 3 month supply if allowed   rizatriptan (MAXALT-MLT) 10 MG disintegrating tablet    Sig: Take 1 tablet (10 mg total) by mouth as needed for migraine. May repeat in 2 hours if needed    Dispense:  36 tablet    Refill:  11    Month supply   verapamil (VERELAN) 240 MG 24 hr capsule    Sig: Take 1 capsule (240 mg total) by mouth daily.    Dispense:  90 capsule    Refill:  4    PRIOR appointment:   Cluster headaches: He has had these in the past, last time was several years ago, very consistent with cluster headaches.  We discussed the difference between cluster headaches and migraines.  I do feel as though he has both and there is higher incidence and migraine auras with other kinds of headaches.  Cluster headaches are also more common in males.  And as the name implies they do happen in "clusters", they can happen at the same time every year, they may leave for a while and then come back.  We discussed the various treatments which include high-dose steroids initially and he would not like to do that.  We did discuss that the erenumab which is a CGRP medication has been approved for this condition, today we will inject him with 3 Ajovy which I feel may be just as  effective, we will also increase his verapamil which is first-line treatment for cluster headaches, I will give him injectable Imitrex at the onset of his headaches.  I also gave him samples of Nurtec for 14 days to see if this can bridge until the other medications start to work.  Discussed oxygen however it is very difficult to get oxygen approved if you do not have a pulmonary disorder.  He will keep me updated on his progress and how the medications are working.   Cc: Dr. Dellia Beckwith, MD   [facility] [address] [place], [place] [address]   Phone [phone] Fax [phone]

## 2023-03-16 DIAGNOSIS — J340 Abscess, furuncle and carbuncle of nose: Secondary | ICD-10-CM | POA: Diagnosis not present

## 2023-03-16 DIAGNOSIS — R0683 Snoring: Secondary | ICD-10-CM | POA: Diagnosis not present

## 2023-03-16 DIAGNOSIS — R430 Anosmia: Secondary | ICD-10-CM | POA: Diagnosis not present

## 2023-03-17 ENCOUNTER — Other Ambulatory Visit: Payer: Self-pay | Admitting: Otolaryngology

## 2023-03-17 DIAGNOSIS — R430 Anosmia: Secondary | ICD-10-CM

## 2023-03-18 DIAGNOSIS — R430 Anosmia: Secondary | ICD-10-CM | POA: Diagnosis not present

## 2023-03-29 ENCOUNTER — Encounter (INDEPENDENT_AMBULATORY_CARE_PROVIDER_SITE_OTHER): Payer: Self-pay | Admitting: Family Medicine

## 2023-03-29 ENCOUNTER — Ambulatory Visit (INDEPENDENT_AMBULATORY_CARE_PROVIDER_SITE_OTHER): Payer: BC Managed Care – PPO | Admitting: Family Medicine

## 2023-03-29 VITALS — BP 113/77 | HR 99 | Temp 98.7°F | Ht 76.0 in | Wt >= 6400 oz

## 2023-03-29 DIAGNOSIS — R7303 Prediabetes: Secondary | ICD-10-CM | POA: Diagnosis not present

## 2023-03-29 DIAGNOSIS — E559 Vitamin D deficiency, unspecified: Secondary | ICD-10-CM

## 2023-03-29 DIAGNOSIS — R632 Polyphagia: Secondary | ICD-10-CM | POA: Diagnosis not present

## 2023-03-29 DIAGNOSIS — Z6841 Body Mass Index (BMI) 40.0 and over, adult: Secondary | ICD-10-CM

## 2023-03-29 DIAGNOSIS — E669 Obesity, unspecified: Secondary | ICD-10-CM

## 2023-03-29 MED ORDER — VITAMIN D (ERGOCALCIFEROL) 1.25 MG (50000 UNIT) PO CAPS: 50000.0000 [IU] | ORAL_CAPSULE | ORAL | 0 refills | Status: DC

## 2023-03-29 MED ORDER — LOMAIRA 8 MG PO TABS: 12.0000 mg | ORAL_TABLET | Freq: Every day | ORAL | 0 refills | Status: DC

## 2023-03-29 MED ORDER — TOPIRAMATE 25 MG PO TABS: 75.0000 mg | ORAL_TABLET | Freq: Every day | ORAL | 0 refills | Status: DC

## 2023-03-29 MED ORDER — METFORMIN HCL 500 MG PO TABS
500.0000 mg | ORAL_TABLET | Freq: Every day | ORAL | 0 refills | Status: DC
Start: 2023-03-29 — End: 2023-04-29

## 2023-03-29 NOTE — Progress Notes (Signed)
Chief Complaint:   OBESITY Alexis [name] is here to discuss his progress with his obesity treatment plan along with follow-up of his obesity related diagnoses. Alexis [name] is on the Category 4 Plan and states he is following his eating plan approximately 90% of the time. [name] states he is walking for 20 minutes 3-4 times per week.  Today's visit was #: 19 Starting weight: 477 lbs Starting date: 12/02/2021 Today's weight: 442 lbs Today's date: 03/29/2023 Total lbs lost to date: 35 Total lbs lost since last in-office visit: 3  Interim History: Patient has noticed an increase in food cravings with medication not being filled by [facility].  He mentions he was told it was too early to get a refill and then was never filled.  Has been supplementing with protein shakes if he is short on protein but not needing it every day.  He has a garden and is growing squash and cucumbers which he has recently increased his intake of.   Subjective:   1. Polyphagia Patient unfortunately could not get his last prescription for Lomaira as [facility] refused to fill due to it "being too early".  Prescription that was sent on 5/31 was never filled and patient is out of his medication.  PDMP was checked; above concerns.  Starting weight 469 pounds and now at 442 pounds.  2. Vitamin D deficiency Patient is on prescription vitamin D.  His last vitamin D level was of 37.8 in March 2024.  He denies nausea, vomiting, or muscle weakness but notes fatigue.  3. Prediabetes Patient's last A1c was 5.7 and insulin 64.4.  He is on metformin daily.  He denies GI side effects on metformin.  Assessment/Plan:   1. Polyphagia Patient will continue his medications, and we will refill Lomaira 12 mg, and topiramate 75 mg for 1 month.  - Phentermine HCl (LOMAIRA) 8 MG TABS; Take 1.5 tablets (12 mg total) by mouth daily.  Dispense: 45 tablet; Refill: 0 - topiramate (TOPAMAX) 25 MG tablet; Take 3 tablets (75 mg total) by mouth daily.  Dispense: 90  tablet; Refill: 0  2. Vitamin D deficiency Patient will continue prescription vitamin D, and we will refill for 1 month.  - Vitamin D, Ergocalciferol, (DRISDOL) 1.25 MG (50000 UNIT) CAPS capsule; Take 1 capsule (50,000 Units total) by mouth every 7 (seven) days.  Dispense: 4 capsule; Refill: 0  3. Prediabetes Patient will continue metformin 500 mg, and we will refill for 90 days.  - metFORMIN (GLUCOPHAGE) 500 MG tablet; Take 1 tablet (500 mg total) by mouth daily with breakfast.  Dispense: 90 tablet; Refill: 0  4. BMI 50.0-59.9, adult (HCC)  5. Obesity with starting BMI of 58.0 [name] is currently in the action stage of change. As such, his goal is to continue with weight loss efforts. He has agreed to the Category 4 Plan.   Exercise goals: All adults should avoid inactivity. Some physical activity is better than none, and adults who participate in any amount of physical activity gain some health benefits.  Behavioral modification strategies: increasing lean protein intake, meal planning and cooking strategies, keeping healthy foods in the home, and planning for success.  [name] has agreed to follow-up with our clinic in 4 weeks. He was informed of the importance of frequent follow-up visits to maximize his success with intensive lifestyle modifications for his multiple health conditions.   Objective:   Blood pressure 113/77, pulse 99, temperature 98.7 F (37.1 C), height 6\' 4"  (1.93 m), weight (!) 442  lb (200.5 kg), SpO2 97 %. Body mass index is 53.8 kg/m.  General: Cooperative, alert, well developed, in no acute distress. HEENT: Conjunctivae and lids unremarkable. Cardiovascular: Regular rhythm.  Lungs: Normal work of breathing. Neurologic: No focal deficits.   Lab Results  Component Value Date   CREATININE 1.23 12/16/2022   BUN 12 12/16/2022   NA 138 12/16/2022   K 3.2 (L) 12/16/2022   CL 99 12/16/2022   CO2 24 12/16/2022   Lab Results  Component Value Date   ALT 28  12/16/2022   AST 19 12/16/2022   ALKPHOS 71 12/16/2022   BILITOT 0.9 12/16/2022   Lab Results  Component Value Date   HGBA1C 5.7 (H) 12/16/2022   HGBA1C 5.8 (H) 12/02/2021   HGBA1C 5.4 11/18/2018   HGBA1C 5.5 03/08/2007   Lab Results  Component Value Date   INSULIN 64.4 (H) 12/16/2022   INSULIN 34.5 (H) 12/02/2021   Lab Results  Component Value Date   TSH 2.930 12/02/2021   Lab Results  Component Value Date   CHOL 167 12/16/2022   HDL 44 12/16/2022   LDLCALC 103 (H) 12/16/2022   TRIG 107 12/16/2022   CHOLHDL 4 02/02/2018   Lab Results  Component Value Date   VD25OH 37.8 12/16/2022   VD25OH 17.7 (L) 12/02/2021   Lab Results  Component Value Date   WBC 9.1 12/16/2022   HGB 14.5 12/16/2022   HCT 45.0 12/16/2022   MCV 78 (L) 12/16/2022   PLT 275 12/16/2022   Lab Results  Component Value Date   IRON 39 12/16/2022   TIBC 270 12/16/2022   FERRITIN 100 12/16/2022   Attestation Statements:   Reviewed by clinician on day of visit: allergies, medications, problem list, medical history, surgical history, family history, social history, and previous encounter notes.   I, Burt Knack, am acting as transcriptionist for Reuben Likes, MD.  I have reviewed the above documentation for accuracy and completeness, and I agree with the above. - Reuben Likes, MD

## 2023-04-08 ENCOUNTER — Ambulatory Visit
Admission: RE | Admit: 2023-04-08 | Discharge: 2023-04-08 | Disposition: A | Discharge status: Home / Self Care | Payer: BC Managed Care – PPO | Source: Ambulatory Visit | Attending: Otolaryngology | Admitting: Otolaryngology

## 2023-04-08 DIAGNOSIS — R430 Anosmia: Secondary | ICD-10-CM

## 2023-04-22 ENCOUNTER — Other Ambulatory Visit (HOSPITAL_BASED_OUTPATIENT_CLINIC_OR_DEPARTMENT_OTHER): Payer: Self-pay

## 2023-04-22 DIAGNOSIS — R0683 Snoring: Secondary | ICD-10-CM

## 2023-04-23 DIAGNOSIS — H04123 Dry eye syndrome of bilateral lacrimal glands: Secondary | ICD-10-CM | POA: Diagnosis not present

## 2023-04-23 DIAGNOSIS — Z9889 Other specified postprocedural states: Secondary | ICD-10-CM | POA: Diagnosis not present

## 2023-04-29 ENCOUNTER — Ambulatory Visit (INDEPENDENT_AMBULATORY_CARE_PROVIDER_SITE_OTHER): Payer: BC Managed Care – PPO | Admitting: Family Medicine

## 2023-04-29 ENCOUNTER — Encounter (INDEPENDENT_AMBULATORY_CARE_PROVIDER_SITE_OTHER): Payer: Self-pay | Admitting: Family Medicine

## 2023-04-29 VITALS — BP 103/72 | HR 97 | Temp 98.6°F | Ht 76.0 in | Wt >= 6400 oz

## 2023-04-29 DIAGNOSIS — R7303 Prediabetes: Secondary | ICD-10-CM

## 2023-04-29 DIAGNOSIS — Z6841 Body Mass Index (BMI) 40.0 and over, adult: Secondary | ICD-10-CM | POA: Diagnosis not present

## 2023-04-29 DIAGNOSIS — R632 Polyphagia: Secondary | ICD-10-CM | POA: Diagnosis not present

## 2023-04-29 DIAGNOSIS — E669 Obesity, unspecified: Secondary | ICD-10-CM

## 2023-04-29 MED ORDER — LOMAIRA 8 MG PO TABS
12.0000 mg | ORAL_TABLET | Freq: Every day | ORAL | 0 refills | Status: DC
Start: 2023-04-29 — End: 2023-05-27

## 2023-04-29 MED ORDER — METFORMIN HCL 500 MG PO TABS
500.0000 mg | ORAL_TABLET | Freq: Every day | ORAL | 0 refills | Status: DC
Start: 2023-04-29 — End: 2023-05-27

## 2023-04-29 NOTE — Progress Notes (Unsigned)
Chief Complaint:   OBESITY Alexis [name] is here to discuss his progress with his obesity treatment plan along with follow-up of his obesity related diagnoses. Alexis [name] is on the Category 4 Plan and states he is following his eating plan approximately 90% of the time. Alexis [name] states he is walking 1 mile and on the treadmill 1-2 times per week.   Today's visit was #: 20 Starting weight: 477 lbs Starting date: 12/02/2021 Today's weight: 442 lbs Today's date: 04/29/2023 Total lbs lost to date: 35 Total lbs lost since last in-office visit: 0  Interim History: Patient had a good birthday, [birth year] and partner's birthday.  He felt positive about his weight staying as is with the indulgences he ate.  He had a cookout pool party for his birthday and his partner's.  Next few weeks will be at home and status quo.  First week of September he is going to the beach Alexis [name]).    Subjective:   1. Prediabetes Patient is on metformin.  His last A1c was 5.7 in March.  2. Polyphagia Patient is on combination Lomaira and Topamax.  His blood pressure is controlled today.  His starting weight was of 469 pounds (now at 442 pounds).  PDMP was checked with no concerns.  Assessment/Plan:   1. Prediabetes We will refill metformin 500 mg once daily for 90 days.  - metFORMIN (GLUCOPHAGE) 500 MG tablet; Take 1 tablet (500 mg total) by mouth daily with breakfast.  Dispense: 90 tablet; Refill: 0  2. Polyphagia Patient will continue his medications, and we will refill Lomaira 12 mg once daily for 1 month.  - Phentermine HCl (LOMAIRA) 8 MG TABS; Take 1.5 tablets (12 mg total) by mouth daily.  Dispense: 45 tablet; Refill: 0  3. BMI 50.0-59.9, adult (HCC)  4. Obesity with starting BMI of 58.0 Alexis [name] is currently in the action stage of change. As such, his goal is to continue with weight loss efforts. He has agreed to the Category 4 Plan.   Patient was shown how to use AI to create 5-day menu for 2200-2500 calories  that is high in protein.  Exercise goals: All adults should avoid inactivity. Some physical activity is better than none, and adults who participate in any amount of physical activity gain some health benefits.  Behavioral modification strategies: increasing lean protein intake, meal planning and cooking strategies, keeping healthy foods in the home, and planning for success.  Alexis [name] has agreed to follow-up with our clinic in 4 weeks. He was informed of the importance of frequent follow-up visits to maximize his success with intensive lifestyle modifications for his multiple health conditions.   Objective:   Blood pressure 103/72, pulse 97, temperature 98.6 F (37 C), height 6\' 4"  (1.93 m), weight (!) 442 lb (200.5 kg), SpO2 100%. Body mass index is 53.8 kg/m.  General: Cooperative, alert, well developed, in no acute distress. HEENT: Conjunctivae and lids unremarkable. Cardiovascular: Regular rhythm.  Lungs: Normal work of breathing. Neurologic: No focal deficits.   Lab Results  Component Value Date   CREATININE 1.23 12/16/2022   BUN 12 12/16/2022   NA 138 12/16/2022   K 3.2 (L) 12/16/2022   CL 99 12/16/2022   CO2 24 12/16/2022   Lab Results  Component Value Date   ALT 28 12/16/2022   AST 19 12/16/2022   ALKPHOS 71 12/16/2022   BILITOT 0.9 12/16/2022   Lab Results  Component Value Date   HGBA1C 5.7 (H) 12/16/2022   HGBA1C 5.8 (  H) 12/02/2021   HGBA1C 5.4 11/18/2018   HGBA1C 5.5 03/08/2007   Lab Results  Component Value Date   INSULIN 64.4 (H) 12/16/2022   INSULIN 34.5 (H) 12/02/2021   Lab Results  Component Value Date   TSH 2.930 12/02/2021   Lab Results  Component Value Date   CHOL 167 12/16/2022   HDL 44 12/16/2022   LDLCALC 103 (H) 12/16/2022   TRIG 107 12/16/2022   CHOLHDL 4 02/02/2018   Lab Results  Component Value Date   VD25OH 37.8 12/16/2022   VD25OH 17.7 (L) 12/02/2021   Lab Results  Component Value Date   WBC 9.1 12/16/2022   HGB 14.5  12/16/2022   HCT 45.0 12/16/2022   MCV 78 (L) 12/16/2022   PLT 275 12/16/2022   Lab Results  Component Value Date   IRON 39 12/16/2022   TIBC 270 12/16/2022   FERRITIN 100 12/16/2022   Attestation Statements:   Reviewed by clinician on day of visit: allergies, medications, problem list, medical history, surgical history, family history, social history, and previous encounter notes.   I, Burt Knack, am acting as transcriptionist for Reuben Likes, MD.  I have reviewed the above documentation for accuracy and completeness, and I agree with the above. - Reuben Likes, MD

## 2023-04-30 ENCOUNTER — Encounter (INDEPENDENT_AMBULATORY_CARE_PROVIDER_SITE_OTHER): Payer: Self-pay | Admitting: Family Medicine

## 2023-05-03 NOTE — Telephone Encounter (Signed)
FYI

## 2023-05-12 ENCOUNTER — Other Ambulatory Visit: Payer: Self-pay | Admitting: Neurology

## 2023-05-12 DIAGNOSIS — I10 Essential (primary) hypertension: Secondary | ICD-10-CM

## 2023-05-12 DIAGNOSIS — G44019 Episodic cluster headache, not intractable: Secondary | ICD-10-CM

## 2023-05-12 DIAGNOSIS — G43709 Chronic migraine without aura, not intractable, without status migrainosus: Secondary | ICD-10-CM

## 2023-05-12 DIAGNOSIS — G43109 Migraine with aura, not intractable, without status migrainosus: Secondary | ICD-10-CM

## 2023-05-17 ENCOUNTER — Other Ambulatory Visit (INDEPENDENT_AMBULATORY_CARE_PROVIDER_SITE_OTHER): Payer: Self-pay | Admitting: Family Medicine

## 2023-05-17 DIAGNOSIS — E559 Vitamin D deficiency, unspecified: Secondary | ICD-10-CM

## 2023-05-18 DIAGNOSIS — L720 Epidermal cyst: Secondary | ICD-10-CM | POA: Diagnosis not present

## 2023-05-18 DIAGNOSIS — L812 Freckles: Secondary | ICD-10-CM | POA: Diagnosis not present

## 2023-05-18 DIAGNOSIS — L738 Other specified follicular disorders: Secondary | ICD-10-CM | POA: Diagnosis not present

## 2023-05-18 DIAGNOSIS — L858 Other specified epidermal thickening: Secondary | ICD-10-CM | POA: Diagnosis not present

## 2023-05-20 DIAGNOSIS — R430 Anosmia: Secondary | ICD-10-CM | POA: Diagnosis not present

## 2023-05-27 ENCOUNTER — Ambulatory Visit (INDEPENDENT_AMBULATORY_CARE_PROVIDER_SITE_OTHER): Payer: BC Managed Care – PPO | Admitting: Family Medicine

## 2023-05-27 ENCOUNTER — Encounter (INDEPENDENT_AMBULATORY_CARE_PROVIDER_SITE_OTHER): Payer: Self-pay | Admitting: Family Medicine

## 2023-05-27 VITALS — BP 130/71 | HR 109 | Temp 98.3°F | Ht 76.0 in | Wt >= 6400 oz

## 2023-05-27 DIAGNOSIS — E559 Vitamin D deficiency, unspecified: Secondary | ICD-10-CM | POA: Diagnosis not present

## 2023-05-27 DIAGNOSIS — E669 Obesity, unspecified: Secondary | ICD-10-CM | POA: Diagnosis not present

## 2023-05-27 DIAGNOSIS — R632 Polyphagia: Secondary | ICD-10-CM

## 2023-05-27 DIAGNOSIS — R7303 Prediabetes: Secondary | ICD-10-CM | POA: Diagnosis not present

## 2023-05-27 DIAGNOSIS — Z6841 Body Mass Index (BMI) 40.0 and over, adult: Secondary | ICD-10-CM

## 2023-05-27 MED ORDER — LOMAIRA 8 MG PO TABS: 12.0000 mg | ORAL_TABLET | Freq: Every day | ORAL | 0 refills | Status: DC

## 2023-05-27 MED ORDER — TOPIRAMATE 25 MG PO TABS: 75.0000 mg | ORAL_TABLET | Freq: Every day | ORAL | 0 refills | Status: DC

## 2023-05-27 MED ORDER — VITAMIN D (ERGOCALCIFEROL) 1.25 MG (50000 UNIT) PO CAPS: 50000.0000 [IU] | ORAL_CAPSULE | ORAL | 0 refills | Status: DC

## 2023-05-27 MED ORDER — METFORMIN HCL 500 MG PO TABS: 500.0000 mg | ORAL_TABLET | Freq: Every day | ORAL | 0 refills | Status: DC

## 2023-05-27 NOTE — Progress Notes (Signed)
Chief Complaint:   OBESITY Alexis [name] is here to discuss his progress with his obesity treatment plan along with follow-up of his obesity related diagnoses. [name] is on the Category 4 Plan and states he is following his eating plan approximately 95% of the time. [name] states he is walking on the treadmill for 20-30 minutes 2-3 times per week.  Today's visit was #: 21 Starting weight: 477 lbs Starting date: 12/02/2021 Today's weight: 436 lbs Today's date: 05/27/2023 Total lbs lost to date: 41 Total lbs lost since last in-office visit: 6  Interim History: Patient has been consistently following meal plan since last appointment.  He is leaving next week for a 10 trip to the beach.  He is very excited about his trip. He will be staying in a condo. He has had some hunger particularly on his drive home after work.  He has started keeping to go cups of peanut butter to eat on the way home.   Subjective:   1. Vitamin D deficiency Patient's last vitamin D level was 37.8.  He denies nausea, vomiting, or muscle weakness but notes fatigue.  2. Polyphagia Patient is on combination Lomaira and topiramate.  His starting weight was of 469 pounds, and he is now at 436 pounds (down 33 pounds).  3. Prediabetes Patient is on metformin with no GI side effects noted.  His last A1c was 5.7, but that was in March.  Assessment/Plan:   1. Vitamin D deficiency Patient will continue prescription vitamin D, and we will refill for 1 month.  We will order labs at his next appointment.  - Vitamin D, Ergocalciferol, (DRISDOL) 1.25 MG (50000 UNIT) CAPS capsule; Take 1 capsule (50,000 Units total) by mouth every 7 (seven) days.  Dispense: 4 capsule; Refill: 0  2. Polyphagia Patient will continue his medications, and we will refill Topamax 75 mg and Lomaira 12 mg for 1 month.  - topiramate (TOPAMAX) 25 MG tablet; Take 3 tablets (75 mg total) by mouth daily.  Dispense: 90 tablet; Refill: 0 - Phentermine HCl  (LOMAIRA) 8 MG TABS; Take 1.5 tablets (12 mg total) by mouth daily.  Dispense: 45 tablet; Refill: 0  3. Prediabetes Patient will continue metformin 500 mg once daily, and we will refill for 90 days.  We will repeat labs at his next appointment.  - metFORMIN (GLUCOPHAGE) 500 MG tablet; Take 1 tablet (500 mg total) by mouth daily with breakfast.  Dispense: 90 tablet; Refill: 0  4. BMI 50.0-59.9, adult (HCC)  5. Obesity with starting BMI of 58.0 Tracey is currently in the action stage of change. As such, his goal is to continue with weight loss efforts. He has agreed to the Category 4 Plan.   Exercise goals: As is.   Behavioral modification strategies: increasing lean protein intake, meal planning and cooking strategies, keeping healthy foods in the home, and planning for success.  Kason has agreed to follow-up with our clinic in 5 weeks. He was informed of the importance of frequent follow-up visits to maximize his success with intensive lifestyle modifications for his multiple health conditions.   Objective:   Blood pressure 130/71, pulse (!) 109, temperature 98.3 F (36.8 C), height 6\' 4"  (1.93 m), weight (!) 436 lb (197.8 kg), SpO2 96%. Body mass index is 53.07 kg/m.  General: Cooperative, alert, well developed, in no acute distress. HEENT: Conjunctivae and lids unremarkable. Cardiovascular: Regular rhythm.  Lungs: Normal work of breathing. Neurologic: No focal deficits.   Lab Results  Component  Value Date   CREATININE 1.23 12/16/2022   BUN 12 12/16/2022   NA 138 12/16/2022   K 3.2 (L) 12/16/2022   CL 99 12/16/2022   CO2 24 12/16/2022   Lab Results  Component Value Date   ALT 28 12/16/2022   AST 19 12/16/2022   ALKPHOS 71 12/16/2022   BILITOT 0.9 12/16/2022   Lab Results  Component Value Date   HGBA1C 5.7 (H) 12/16/2022   HGBA1C 5.8 (H) 12/02/2021   HGBA1C 5.4 11/18/2018   HGBA1C 5.5 03/08/2007   Lab Results  Component Value Date   INSULIN 64.4 (H) 12/16/2022    INSULIN 34.5 (H) 12/02/2021   Lab Results  Component Value Date   TSH 2.930 12/02/2021   Lab Results  Component Value Date   CHOL 167 12/16/2022   HDL 44 12/16/2022   LDLCALC 103 (H) 12/16/2022   TRIG 107 12/16/2022   CHOLHDL 4 02/02/2018   Lab Results  Component Value Date   VD25OH 37.8 12/16/2022   VD25OH 17.7 (L) 12/02/2021   Lab Results  Component Value Date   WBC 9.1 12/16/2022   HGB 14.5 12/16/2022   HCT 45.0 12/16/2022   MCV 78 (L) 12/16/2022   PLT 275 12/16/2022   Lab Results  Component Value Date   IRON 39 12/16/2022   TIBC 270 12/16/2022   FERRITIN 100 12/16/2022   Attestation Statements:   Reviewed by clinician on day of visit: allergies, medications, problem list, medical history, surgical history, family history, social history, and previous encounter notes.   I, Burt Knack, am acting as transcriptionist for Reuben Likes, MD.  I have reviewed the above documentation for accuracy and completeness, and I agree with the above. - Reuben Likes, MD

## 2023-06-03 DIAGNOSIS — M79672 Pain in left foot: Secondary | ICD-10-CM | POA: Diagnosis not present

## 2023-06-03 DIAGNOSIS — M79671 Pain in right foot: Secondary | ICD-10-CM | POA: Diagnosis not present

## 2023-06-06 ENCOUNTER — Telehealth: Payer: BC Managed Care – PPO | Admitting: Family

## 2023-06-06 DIAGNOSIS — U071 COVID-19: Secondary | ICD-10-CM

## 2023-06-06 MED ORDER — PREDNISONE 10 MG (21) PO TBPK
ORAL_TABLET | ORAL | 0 refills | Status: DC
Start: 2023-06-06 — End: 2023-08-19

## 2023-06-06 MED ORDER — FLUTICASONE PROPIONATE 50 MCG/ACT NA SUSP
2.0000 | Freq: Every day | NASAL | 6 refills | Status: DC
Start: 2023-06-06 — End: 2023-09-13

## 2023-06-06 MED ORDER — ALBUTEROL SULFATE HFA 108 (90 BASE) MCG/ACT IN AERS
2.0000 | INHALATION_SPRAY | Freq: Four times a day (QID) | RESPIRATORY_TRACT | 0 refills | Status: AC | PRN
Start: 2023-06-06 — End: ?

## 2023-06-06 MED ORDER — BENZONATATE 100 MG PO CAPS
100.0000 mg | ORAL_CAPSULE | Freq: Three times a day (TID) | ORAL | 0 refills | Status: DC | PRN
Start: 2023-06-06 — End: 2023-07-19

## 2023-06-06 NOTE — Progress Notes (Signed)
E-Visit  for Positive Covid Test Result   We are sorry you are not feeling well. We are here to help!  You have tested positive for COVID-19, meaning that you were infected with the novel coronavirus and could give the virus to others.  Most people with COVID-19 have mild illness and can recover at home without medical care. Do not leave your home, except to get medical care. Do not visit public areas and do not go to places where you are unable to wear a mask. It is important that you stay home  to take care for yourself and to help protect other people in your home and community.      Isolation Instructions:   You are to isolate at home until you have been fever free for at least 24 hours without a fever-reducing medication, and symptoms have been steadily improving for 24 hours. At that time,  you can end isolation but need to mask for an additional 5 days.  If you must be around other household members who do not have symptoms, you need to make sure that both you and the family members are masking consistently with a high-quality mask.  If you note any worsening of symptoms despite treatment, please seek an in-person evaluation ASAP. If you note any significant shortness of breath or any chest pain, please seek ER evaluation. Please do not delay care!   Go to the nearest hospital ED for assessment if fever/cough/breathlessness are severe or illness seems like a threat to life.    The following symptoms may appear 2-14 days after exposure: Fever Cough Shortness of breath or difficulty breathing Chills Repeated shaking with chills Muscle pain Headache Sore throat New loss of taste or smell Fatigue Congestion or runny nose Nausea or vomiting Diarrhea  You can use medication such as prescription cough medication called Tessalon Perles 100 mg. You may take 1-2 capsules every 8 hours as needed for cough,  prescription inhaler called Albuterol MDI 90 mcg /actuation 2 puffs every 4  hours as needed for shortness of breath, wheezing, cough, and prescription for Fluticasone nasal spray 2 sprays in each nostril one time per day and prednisone dose pack.   You may also take acetaminophen (Tylenol) as needed for fever.  HOME CARE: Only take medications as instructed by your medical team. Drink plenty of fluids and get plenty of rest. A steam or ultrasonic humidifier can help if you have congestion.   GET HELP RIGHT AWAY IF YOU HAVE EMERGENCY WARNING SIGNS.  Call 911 or proceed to your closest emergency facility if: You develop worsening high fever. Trouble breathing Bluish lips or face Persistent pain or pressure in the chest New confusion Inability to wake or stay awake You cough up blood. Your symptoms become more severe Inability to hold down food or fluids  This list is not all possible symptoms. Contact your medical provider for any symptoms that are severe or concerning to you.   Your e-visit answers were reviewed by a board certified advanced clinical practitioner to complete your personal care plan.  Depending on the condition, your plan could have included both over the counter or prescription medications.  If there is a problem please reply once you have received a response from your provider.  Your safety is important to [place].  If you have drug allergies check your prescription carefully.    You can use MyChart to ask questions about today's visit, request a non-urgent call back, or ask for a  work or school excuse for 24 hours related to this e-Visit. If it has been greater than 24 hours you will need to follow up with your provider, or enter a new e-Visit to address those concerns. You will get an e-mail in the next two days asking about your experience.  I hope that your e-visit has been valuable and will speed your recovery. Thank you for using e-visits.   Approximately 5 minutes was spent documenting and reviewing patient's chart.

## 2023-07-19 ENCOUNTER — Ambulatory Visit (INDEPENDENT_AMBULATORY_CARE_PROVIDER_SITE_OTHER): Payer: BC Managed Care – PPO | Admitting: Family Medicine

## 2023-07-19 ENCOUNTER — Encounter (INDEPENDENT_AMBULATORY_CARE_PROVIDER_SITE_OTHER): Payer: Self-pay | Admitting: Family Medicine

## 2023-07-19 VITALS — BP 99/74 | HR 87 | Temp 98.3°F | Ht 76.0 in | Wt >= 6400 oz

## 2023-07-19 DIAGNOSIS — E559 Vitamin D deficiency, unspecified: Secondary | ICD-10-CM | POA: Diagnosis not present

## 2023-07-19 DIAGNOSIS — R632 Polyphagia: Secondary | ICD-10-CM | POA: Diagnosis not present

## 2023-07-19 DIAGNOSIS — E669 Obesity, unspecified: Secondary | ICD-10-CM | POA: Diagnosis not present

## 2023-07-19 DIAGNOSIS — Z6841 Body Mass Index (BMI) 40.0 and over, adult: Secondary | ICD-10-CM

## 2023-07-19 MED ORDER — TOPIRAMATE 25 MG PO TABS
75.0000 mg | ORAL_TABLET | Freq: Every day | ORAL | 0 refills | Status: DC
Start: 2023-07-19 — End: 2023-08-19

## 2023-07-19 MED ORDER — VITAMIN D (ERGOCALCIFEROL) 1.25 MG (50000 UNIT) PO CAPS: 50000.0000 [IU] | ORAL_CAPSULE | ORAL | 0 refills | Status: DC

## 2023-07-19 MED ORDER — LOMAIRA 8 MG PO TABS: 12.0000 mg | ORAL_TABLET | Freq: Every day | ORAL | 0 refills | Status: DC

## 2023-07-19 NOTE — Progress Notes (Signed)
Chief Complaint:   OBESITY Claire is here to discuss his progress with his obesity treatment plan along with follow-up of his obesity related diagnoses. Areeb is on the Category 4 Plan and states he is following his eating plan approximately 75% of the time. Riley states he is doing yoga and walking for 20-60 minutes 2-3 times per week.  Today's visit was #: 22 Starting weight: 477 lbs Starting date: 12/02/2021 Today's weight: 431 lbs Today's date: 07/19/2023 Total lbs lost to date: 46 Total lbs lost since last in-office visit: 5  Interim History: Patient got COVID since last appointment and his parents both had COVID.  Work has also been significantly stressful.  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  Next few weeks he is just prepping for the holidays.  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   Subjective:   1. Vitamin D deficiency Patient denies nausea, vomiting, or muscle weakness but notes fatigue.  2. Polyphagia Patient is on combination phentermine and topiramate.  PDMP was checked with no concerns.  Patient starting weight was 469 pounds, and he is now at 431 pounds (down 38 pounds).  Assessment/Plan:   1. Vitamin D deficiency We will refill prescription vitamin D 50,000 IU once weekly for 1 month.  Patient is to get labs done at work.  - Vitamin D, Ergocalciferol, (DRISDOL) 1.25 MG (50000 UNIT) CAPS capsule; Take 1 capsule (50,000 Units total) by mouth every 7 (seven) days.  Dispense: 4 capsule; Refill: 0  2. Polyphagia We will refill phentermine 12 mg once daily and topiramate 75 mg once daily for 1 month.   - topiramate (TOPAMAX) 25 MG tablet; Take 3 tablets (75 mg total) by mouth daily.  Dispense: 90 tablet; Refill: 0 - Phentermine HCl (LOMAIRA) 8 MG TABS; Take 1.5 tablets (12 mg total) by mouth daily.  Dispense: 45 tablet; Refill: 0  3. BMI 50.0-59.9, adult (HCC)  4. Obesity with starting BMI of 58.0 Lio is  currently in the action stage of change. As such, his goal is to continue with weight loss efforts. He has agreed to the Category 4 Plan.   Exercise goals: As is.   Behavioral modification strategies: increasing lean protein intake, meal planning and cooking strategies, keeping healthy foods in the home, and planning for success.  Vasilios has agreed to follow-up with our clinic in 4 weeks. He was informed of the importance of frequent follow-up visits to maximize his success with intensive lifestyle modifications for his multiple health conditions.   Objective:   Blood pressure 99/74, pulse 87, temperature 98.3 F (36.8 C), height 6\' 4"  (1.93 m), weight (!) 431 lb (195.5 kg), SpO2 97%. Body mass index is 52.46 kg/m.  General: Cooperative, alert, well developed, in no acute distress. HEENT: Conjunctivae and lids unremarkable. Cardiovascular: Regular rhythm.  Lungs: Normal work of breathing. Neurologic: No focal deficits.   Lab Results  Component Value Date   CREATININE 1.23 12/16/2022   BUN 12 12/16/2022   NA 138 12/16/2022   K 3.2 (L) 12/16/2022   CL 99 12/16/2022   CO2 24 12/16/2022   Lab Results  Component Value Date   ALT 28 12/16/2022   AST 19 12/16/2022   ALKPHOS 71 12/16/2022   BILITOT 0.9 12/16/2022   Lab Results  Component Value Date   HGBA1C 5.7 (H) 12/16/2022   HGBA1C 5.8 (H) 12/02/2021   HGBA1C 5.4 11/18/2018   HGBA1C 5.5 03/08/2007  Lab Results  Component Value Date   INSULIN 64.4 (H) 12/16/2022   INSULIN 34.5 (H) 12/02/2021   Lab Results  Component Value Date   TSH 2.930 12/02/2021   Lab Results  Component Value Date   CHOL 167 12/16/2022   HDL 44 12/16/2022   LDLCALC 103 (H) 12/16/2022   TRIG 107 12/16/2022   CHOLHDL 4 02/02/2018   Lab Results  Component Value Date   VD25OH 37.8 12/16/2022   VD25OH 17.7 (L) 12/02/2021   Lab Results  Component Value Date   WBC 9.1 12/16/2022   HGB 14.5 12/16/2022   HCT 45.0 12/16/2022   MCV 78 (L)  12/16/2022   PLT 275 12/16/2022   Lab Results  Component Value Date   IRON 39 12/16/2022   TIBC 270 12/16/2022   FERRITIN 100 12/16/2022   Attestation Statements:   Reviewed by clinician on day of visit: allergies, medications, problem list, medical history, surgical history, family history, social history, and previous encounter notes.   I, Burt Knack, am acting as transcriptionist for Reuben Likes, MD.  I have reviewed the above documentation for accuracy and completeness, and I agree with the above. - Reuben Likes, MD

## 2023-07-28 LAB — CBC AND DIFFERENTIAL
HCT: 47 (ref 41–53)
Hemoglobin: 14.8 (ref 13.5–17.5)
Neutrophils Absolute: 8.3
Platelets: 289 10*3/uL (ref 150–400)
WBC: 11.1

## 2023-07-28 LAB — BASIC METABOLIC PANEL
BUN: 11 (ref 4–21)
CO2: 21 (ref 13–22)
Chloride: 100 (ref 99–108)
Creatinine: 1.2 (ref 0.6–1.3)
Glucose: 93
Potassium: 3.3 meq/L — AB (ref 3.5–5.1)
Sodium: 136 — AB (ref 137–147)

## 2023-07-28 LAB — IRON,TIBC AND FERRITIN PANEL
Ferritin: 86
Iron: 37
TIBC: 320
UIBC: 283

## 2023-07-28 LAB — HEPATIC FUNCTION PANEL
ALT: 29 U/L (ref 10–40)
AST: 22 (ref 14–40)
Alkaline Phosphatase: 74 (ref 25–125)
Bilirubin, Total: 1

## 2023-07-28 LAB — LIPID PANEL
Cholesterol: 171 (ref 0–200)
HDL: 42 (ref 35–70)
LDL Cholesterol: 103
Triglycerides: 146 (ref 40–160)

## 2023-07-28 LAB — VITAMIN B12: Vitamin B-12: 722

## 2023-07-28 LAB — CBC: RBC: 5.85 — AB (ref 3.87–5.11)

## 2023-07-28 LAB — VITAMIN D 25 HYDROXY (VIT D DEFICIENCY, FRACTURES): Vit D, 25-Hydroxy: 38.7

## 2023-07-28 LAB — TSH: TSH: 2.74 (ref 0.41–5.90)

## 2023-07-28 LAB — HEMOGLOBIN A1C: Hemoglobin A1C: 5.7

## 2023-07-29 LAB — COMPREHENSIVE METABOLIC PANEL
Albumin: 4.1 (ref 3.5–5.0)
Calcium: 9.3 (ref 8.7–10.7)
EGFR: 82
Globulin: 2.8

## 2023-08-05 ENCOUNTER — Other Ambulatory Visit (INDEPENDENT_AMBULATORY_CARE_PROVIDER_SITE_OTHER): Payer: Self-pay

## 2023-08-18 DIAGNOSIS — M7671 Peroneal tendinitis, right leg: Secondary | ICD-10-CM | POA: Diagnosis not present

## 2023-08-18 DIAGNOSIS — M79671 Pain in right foot: Secondary | ICD-10-CM | POA: Diagnosis not present

## 2023-08-19 ENCOUNTER — Other Ambulatory Visit (INDEPENDENT_AMBULATORY_CARE_PROVIDER_SITE_OTHER): Payer: Self-pay | Admitting: Family Medicine

## 2023-08-19 ENCOUNTER — Encounter (INDEPENDENT_AMBULATORY_CARE_PROVIDER_SITE_OTHER): Payer: Self-pay | Admitting: Family Medicine

## 2023-08-19 ENCOUNTER — Ambulatory Visit (INDEPENDENT_AMBULATORY_CARE_PROVIDER_SITE_OTHER): Payer: BC Managed Care – PPO | Admitting: Family Medicine

## 2023-08-19 VITALS — BP 104/73 | HR 101 | Temp 98.3°F | Ht 76.0 in | Wt >= 6400 oz

## 2023-08-19 DIAGNOSIS — R632 Polyphagia: Secondary | ICD-10-CM

## 2023-08-19 DIAGNOSIS — I10 Essential (primary) hypertension: Secondary | ICD-10-CM | POA: Diagnosis not present

## 2023-08-19 DIAGNOSIS — E559 Vitamin D deficiency, unspecified: Secondary | ICD-10-CM | POA: Diagnosis not present

## 2023-08-19 DIAGNOSIS — R7303 Prediabetes: Secondary | ICD-10-CM

## 2023-08-19 DIAGNOSIS — Z6841 Body Mass Index (BMI) 40.0 and over, adult: Secondary | ICD-10-CM

## 2023-08-19 MED ORDER — VERAPAMIL HCL ER 180 MG PO CP24: 180.0000 mg | ORAL_CAPSULE | Freq: Every day | ORAL | 0 refills | Status: DC

## 2023-08-19 MED ORDER — TOPIRAMATE 100 MG PO TABS
100.0000 mg | ORAL_TABLET | Freq: Every day | ORAL | 0 refills | Status: DC
Start: 2023-08-19 — End: 2023-09-13

## 2023-08-19 MED ORDER — METFORMIN HCL 500 MG PO TABS: 500.0000 mg | ORAL_TABLET | Freq: Every day | ORAL | 0 refills | Status: DC

## 2023-08-19 MED ORDER — CHOLECALCIFEROL 1.25 MG (50000 UT) PO TABS
4.0000 | ORAL_TABLET | ORAL | 0 refills | Status: DC
Start: 2023-08-19 — End: 2023-09-13

## 2023-08-19 MED ORDER — PHENTERMINE HCL 15 MG PO CAPS
15.0000 mg | ORAL_CAPSULE | Freq: Every day | ORAL | 0 refills | Status: DC
Start: 2023-08-19 — End: 2023-09-13

## 2023-08-19 NOTE — Assessment & Plan Note (Signed)
Starting makeshift Qsymia on 08/26/22 at 469lb- starting 1st treatment dose on 11/10/22.  Now at 427lbs.  Weight loss has begun to slow down and patient no longer feeling much of the effects of the medication.  Will increase medication to 15mg  of phentermine and 100mg  of topiramate.  PDMP checked and no concerns noted.  Increased doses of medications sent in.  BP well controlled and slightly low today.

## 2023-08-19 NOTE — Progress Notes (Signed)
SUBJECTIVE:  Chief Complaint: Obesity  Interim History: Patient has been doing most of the same.  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 He is doing well following the Category plan.  He didn't do much for Halloween except a yoga party at work.  For Thanksgiving he is getting together with family.  He has gotten to a point where he isn't noticing the medicine at all so is wondering about an increase.   Bard is here to discuss his progress with his obesity treatment plan. He is on the Category 4 Plan and states he is following his eating plan approximately 85 % of the time. He states he is exercising 30-60 minutes 4  times per week.   OBJECTIVE: Visit Diagnoses: Problem List Items Addressed This Visit       Cardiovascular and Mediastinum   Essential hypertension    On verapamil and avalide.  BP last appointment was low with a systolic less than 100.  Today systolic BP barely over 100.  No dizziness or lightheadedness.  Patient has been taking his BP at work and it has been systolically in the 100-110s.  Will decrease dose of verapamil to 180 from 240.  Follow up on BP at next appointment on lower dose.       Relevant Medications   verapamil (VERELAN) 180 MG 24 hr capsule     Other   Morbid obesity (HCC)   Relevant Medications   phentermine 15 MG capsule   metFORMIN (GLUCOPHAGE) 500 MG tablet   Polyphagia - Primary    Starting makeshift Qsymia on 08/26/22 at 469lb- starting 1st treatment dose on 11/10/22.  Now at 427lbs.  Weight loss has begun to slow down and patient no longer feeling much of the effects of the medication.  Will increase medication to 15mg  of phentermine and 100mg  of topiramate.  PDMP checked and no concerns noted.  Increased doses of medications sent in.  BP well controlled and slightly low today.      Relevant Medications   phentermine 15 MG capsule   topiramate (TOPAMAX) 100 MG tablet   Prediabetes    On metformin  with no GI side effects.  Recent A1c of 5.7 (unchanged from previously).  Patient to continue monitoring macronutrient intake and will need repeat labs in 3 months.  Refill of Metformin sent in today.      Relevant Medications   metFORMIN (GLUCOPHAGE) 500 MG tablet   Vitamin D deficiency    Patient on ergocalciferol for the last 7 months with minimal increase in his Vitamin D level.  Will transition to cholecalciferol as some patients absorb this better and will repeat a Vitamin D level in 3-4 months.  Prescription of Cholecalciferol sent in.      Relevant Medications   Cholecalciferol 1.25 MG (50000 UT) TABS   Other Visit Diagnoses     BMI 50.0-59.9, adult (HCC)       Relevant Medications   phentermine 15 MG capsule   metFORMIN (GLUCOPHAGE) 500 MG tablet       Vitals Temp: 98.3 F (36.8 C) BP: 104/73 Pulse Rate: (!) 101 SpO2: 100 %   Anthropometric Measurements Height: 6\' 4"  (1.93 m) Weight: (!) 427 lb (193.7 kg) BMI (Calculated): 52 Weight at Last Visit: 431 lb Weight Lost Since Last Visit: 4 Weight Gained Since Last Visit: 0 Starting Weight: 477 lb Total Weight Loss (lbs): 50 lb (22.7 kg)   Body Composition  Body Fat %: 45.1 %  Fat Mass (lbs): 192.8 lbs Muscle Mass (lbs): 223.2 lbs Total Body Water (lbs): 181 lbs Visceral Fat Rating : 31   Other Clinical Data Today's Visit #: 23 Starting Date: 12/02/21     ASSESSMENT AND PLAN:  Diet: Narvel is currently in the action stage of change. As such, his goal is to continue with weight loss efforts. He has agreed to Category 4 Plan.  Exercise: Cordale has been instructed to continue exercising as is for weight loss and overall health benefits.   Behavior Modification:  We discussed the following Behavioral Modification Strategies today: increasing lean protein intake, increasing vegetables, meal planning and cooking strategies, family/coworker sabotage, and holiday eating strategies. We discussed various  medication options to help Vonnie with his weight loss efforts and we both agreed to increase dose of makeshift qsymia to max dose.  No follow-ups on file.Marland Kitchen He was informed of the importance of frequent follow up visits to maximize his success with intensive lifestyle modifications for his multiple health conditions.  Attestation Statements:   Reviewed by clinician on day of visit: allergies, medications, problem list, medical history, surgical history, family history, social history, and previous encounter notes.   Reuben Likes, MD

## 2023-08-19 NOTE — Assessment & Plan Note (Signed)
On verapamil and avalide.  BP last appointment was low with a systolic less than 100.  Today systolic BP barely over 100.  No dizziness or lightheadedness.  Patient has been taking his BP at work and it has been systolically in the 100-110s.  Will decrease dose of verapamil to 180 from 240.  Follow up on BP at next appointment on lower dose.

## 2023-08-19 NOTE — Assessment & Plan Note (Signed)
On metformin with no GI side effects.  Recent A1c of 5.7 (unchanged from previously).  Patient to continue monitoring macronutrient intake and will need repeat labs in 3 months.  Refill of Metformin sent in today.

## 2023-08-19 NOTE — Assessment & Plan Note (Signed)
Patient on ergocalciferol for the last 7 months with minimal increase in his Vitamin D level.  Will transition to cholecalciferol as some patients absorb this better and will repeat a Vitamin D level in 3-4 months.  Prescription of Cholecalciferol sent in.

## 2023-09-13 ENCOUNTER — Encounter (INDEPENDENT_AMBULATORY_CARE_PROVIDER_SITE_OTHER): Payer: Self-pay | Admitting: Family Medicine

## 2023-09-13 ENCOUNTER — Ambulatory Visit (INDEPENDENT_AMBULATORY_CARE_PROVIDER_SITE_OTHER): Payer: BC Managed Care – PPO | Admitting: Family Medicine

## 2023-09-13 DIAGNOSIS — R632 Polyphagia: Secondary | ICD-10-CM | POA: Diagnosis not present

## 2023-09-13 DIAGNOSIS — E559 Vitamin D deficiency, unspecified: Secondary | ICD-10-CM | POA: Diagnosis not present

## 2023-09-13 DIAGNOSIS — I10 Essential (primary) hypertension: Secondary | ICD-10-CM | POA: Diagnosis not present

## 2023-09-13 DIAGNOSIS — R7303 Prediabetes: Secondary | ICD-10-CM | POA: Diagnosis not present

## 2023-09-13 DIAGNOSIS — Z6841 Body Mass Index (BMI) 40.0 and over, adult: Secondary | ICD-10-CM

## 2023-09-13 DIAGNOSIS — E669 Obesity, unspecified: Secondary | ICD-10-CM

## 2023-09-13 MED ORDER — TOPIRAMATE 100 MG PO TABS: 100.0000 mg | ORAL_TABLET | Freq: Every day | ORAL | 0 refills | Status: DC

## 2023-09-13 MED ORDER — PHENTERMINE HCL 15 MG PO CAPS: 15.0000 mg | ORAL_CAPSULE | Freq: Every day | ORAL | 0 refills | Status: DC

## 2023-09-13 MED ORDER — METFORMIN HCL 500 MG PO TABS: 500.0000 mg | ORAL_TABLET | Freq: Every day | ORAL | 0 refills | Status: DC

## 2023-09-13 MED ORDER — CHOLECALCIFEROL 1.25 MG (50000 UT) PO TABS: 4.0000 | ORAL_TABLET | ORAL | 0 refills | Status: DC

## 2023-09-13 MED ORDER — VERAPAMIL HCL ER 180 MG PO CP24: 180.0000 mg | ORAL_CAPSULE | Freq: Every day | ORAL | 0 refills | Status: DC

## 2023-09-13 NOTE — Assessment & Plan Note (Signed)
Doing better in terms of intake control and choice with 15/100mg  doses of phentermine and topiramate.  No side effects at higher dose noticed.  PDMP checked with no concerns.  Refills of 15mg  Phentermine and 100mg  Topiramate sent in.

## 2023-09-13 NOTE — Assessment & Plan Note (Signed)
Patient needs refill of metformin today.  No GI side effects experienced on current med and dose.  Refill of metformin sent in.

## 2023-09-13 NOTE — Assessment & Plan Note (Signed)
Patient BP today very well controlled even on lower dose of verapamil (decreased to 180 from 240 at last appointment).  No chest pain, chest pressure or headache. Needs refill of verapamil at current dose so refill sent in.

## 2023-09-13 NOTE — Progress Notes (Signed)
   SUBJECTIVE:  Chief Complaint: Obesity  Interim History: Patient had a relatively good Thanksgiving.  He mentions that his uncle died 3 weeks ago and he and his family are still dealing with this.  He has been able to consistently follow meal plan even with Thanksgiving- focused on protein and vegetables.  Anticipating a similar month of December in terms of food intake, activity and events.   Garred is here to discuss his progress with his obesity treatment plan. He is on the Category 4 Plan and states he is following his eating plan approximately 85 % of the time. He states he is exercising 20-60 minutes 3-4 times per week.   OBJECTIVE: Visit Diagnoses: Problem List Items Addressed This Visit       Cardiovascular and Mediastinum   Essential hypertension    Patient BP today very well controlled even on lower dose of verapamil (decreased to 180 from 240 at last appointment).  No chest pain, chest pressure or headache. Needs refill of verapamil at current dose so refill sent in.        Other   Polyphagia    Doing better in terms of intake control and choice with 15/100mg  doses of phentermine and topiramate.  No side effects at higher dose noticed.  PDMP checked with no concerns.  Refills of 15mg  Phentermine and 100mg  Topiramate sent in.      Prediabetes    Patient needs refill of metformin today.  No GI side effects experienced on current med and dose.  Refill of metformin sent in.      Vitamin D deficiency    Patient needs refill of Vitamin D today (changed to cholecalciferol from ergocalciferol due to lack of increase in vitamin d level).  Refill sent in       Vitals Temp: 98.3 F (36.8 C) BP: 118/77 Pulse Rate: 90 SpO2: 99 %   Anthropometric Measurements Height: 6\' 4"  (1.93 m) Weight: (!) 423 lb (191.9 kg) BMI (Calculated): 51.51 Weight at Last Visit: 427 lb Weight Lost Since Last Visit: 4 Weight Gained Since Last Visit: 0 Starting Weight: 477 lb Total Weight  Loss (lbs): 54 lb (24.5 kg)   Body Composition  Body Fat %: 44.8 % Fat Mass (lbs): 189.6 lbs Muscle Mass (lbs): 222.6 lbs Total Body Water (lbs): 179.4 lbs Visceral Fat Rating : 31   Other Clinical Data Today's Visit #: 24 Starting Date: 12/02/21     ASSESSMENT AND PLAN:  Diet: Kamarri is currently in the action stage of change. As such, his goal is to continue with weight loss efforts. He has agreed to Category 4 Plan.  Exercise: Makarios has been instructed that some exercise is better than none for weight loss and overall health benefits.   Behavior Modification:  We discussed the following Behavioral Modification Strategies today: increasing lean protein intake, increasing vegetables, meal planning and cooking strategies, emotional eating strategies , and holiday eating strategies. We discussed various medication options to help Alexan with his weight loss efforts and we both agreed to continue dose of phentermine and topiramate.  No follow-ups on file.Marland Kitchen He was informed of the importance of frequent follow up visits to maximize his success with intensive lifestyle modifications for his multiple health conditions.  Attestation Statements:   Reviewed by clinician on day of visit: allergies, medications, problem list, medical history, surgical history, family history, social history, and previous encounter notes.     Reuben Likes, MD

## 2023-09-13 NOTE — Assessment & Plan Note (Signed)
Patient needs refill of Vitamin D today (changed to cholecalciferol from ergocalciferol due to lack of increase in vitamin d level).  Refill sent in

## 2023-09-14 MED ORDER — CHOLECALCIFEROL 1.25 MG (50000 UT) PO TABS: 1.0000 | ORAL_TABLET | ORAL | 0 refills | Status: DC

## 2023-09-14 NOTE — Addendum Note (Signed)
Addended by: Kirke Corin A on: 09/14/2023 03:24 PM   Modules accepted: Orders

## 2023-09-18 ENCOUNTER — Other Ambulatory Visit (INDEPENDENT_AMBULATORY_CARE_PROVIDER_SITE_OTHER): Payer: Self-pay | Admitting: Family Medicine

## 2023-09-18 DIAGNOSIS — I10 Essential (primary) hypertension: Secondary | ICD-10-CM

## 2023-09-20 ENCOUNTER — Telehealth: Payer: BC Managed Care – PPO | Admitting: Neurology

## 2023-09-21 DIAGNOSIS — H04123 Dry eye syndrome of bilateral lacrimal glands: Secondary | ICD-10-CM | POA: Diagnosis not present

## 2023-10-18 ENCOUNTER — Ambulatory Visit (INDEPENDENT_AMBULATORY_CARE_PROVIDER_SITE_OTHER): Payer: BC Managed Care – PPO | Admitting: Family Medicine

## 2023-10-19 ENCOUNTER — Other Ambulatory Visit (INDEPENDENT_AMBULATORY_CARE_PROVIDER_SITE_OTHER): Payer: Self-pay | Admitting: Family Medicine

## 2023-10-19 DIAGNOSIS — R632 Polyphagia: Secondary | ICD-10-CM

## 2023-10-23 ENCOUNTER — Other Ambulatory Visit (INDEPENDENT_AMBULATORY_CARE_PROVIDER_SITE_OTHER): Payer: Self-pay | Admitting: Family Medicine

## 2023-10-23 DIAGNOSIS — I10 Essential (primary) hypertension: Secondary | ICD-10-CM

## 2023-10-25 DIAGNOSIS — Z23 Encounter for immunization: Secondary | ICD-10-CM | POA: Diagnosis not present

## 2023-10-25 DIAGNOSIS — I10 Essential (primary) hypertension: Secondary | ICD-10-CM | POA: Diagnosis not present

## 2023-10-25 DIAGNOSIS — K219 Gastro-esophageal reflux disease without esophagitis: Secondary | ICD-10-CM | POA: Diagnosis not present

## 2023-10-25 DIAGNOSIS — J45909 Unspecified asthma, uncomplicated: Secondary | ICD-10-CM | POA: Diagnosis not present

## 2023-10-25 DIAGNOSIS — F324 Major depressive disorder, single episode, in partial remission: Secondary | ICD-10-CM | POA: Diagnosis not present

## 2023-10-26 ENCOUNTER — Other Ambulatory Visit (INDEPENDENT_AMBULATORY_CARE_PROVIDER_SITE_OTHER): Payer: Self-pay | Admitting: Family Medicine

## 2023-10-26 DIAGNOSIS — I10 Essential (primary) hypertension: Secondary | ICD-10-CM

## 2023-10-26 MED ORDER — VERAPAMIL HCL ER 180 MG PO CP24: 180.0000 mg | ORAL_CAPSULE | Freq: Every day | ORAL | 0 refills | Status: DC

## 2023-11-15 ENCOUNTER — Encounter (INDEPENDENT_AMBULATORY_CARE_PROVIDER_SITE_OTHER): Payer: Self-pay | Admitting: Family Medicine

## 2023-11-15 ENCOUNTER — Ambulatory Visit (INDEPENDENT_AMBULATORY_CARE_PROVIDER_SITE_OTHER): Payer: BC Managed Care – PPO | Admitting: Family Medicine

## 2023-11-15 VITALS — BP 108/72 | HR 83 | Temp 98.6°F | Ht 76.0 in | Wt >= 6400 oz

## 2023-11-15 DIAGNOSIS — Z6841 Body Mass Index (BMI) 40.0 and over, adult: Secondary | ICD-10-CM | POA: Diagnosis not present

## 2023-11-15 DIAGNOSIS — E669 Obesity, unspecified: Secondary | ICD-10-CM

## 2023-11-15 DIAGNOSIS — R632 Polyphagia: Secondary | ICD-10-CM | POA: Diagnosis not present

## 2023-11-15 DIAGNOSIS — I10 Essential (primary) hypertension: Secondary | ICD-10-CM | POA: Diagnosis not present

## 2023-11-15 MED ORDER — PHENTERMINE HCL 15 MG PO CAPS: 15.0000 mg | ORAL_CAPSULE | Freq: Every day | ORAL | 0 refills | Status: DC

## 2023-11-15 MED ORDER — TOPIRAMATE 100 MG PO TABS: 100.0000 mg | ORAL_TABLET | Freq: Every day | ORAL | 0 refills | Status: DC

## 2023-11-15 NOTE — Progress Notes (Signed)
   SUBJECTIVE:   Chief Complaint: Obesity  Interim History: Patient here for follow up.  He did not gain anything through the holidays and then missed his appointment in January due to a coworker passing out and having to be transported to hospital.  He can definitely tell he has been without his medication over the last 3 weeks. He strayed a bit off the plan over the last few weeks due to being hungry and being without medication.  He has been consistent with his physical activity over the last few weeks as well.    Tayton is here to discuss his progress with his obesity treatment plan. He is on the Category 4 Plan and states he is following his eating plan approximately 70 % of the time. He states he is exercising 120 minutes 3 times per week.   OBJECTIVE: Visit Diagnoses: Problem List Items Addressed This Visit       Cardiovascular and Mediastinum   Essential hypertension - Primary   Patients blood pressure has been consistently well controlled which he attributes to weight loss, meditation and yoga.  If BP stays well controlled at next appointment will decrease avalide to 150-12.5mg  dose.        Other   Polyphagia   Ran out of medication since last appointment.  Starting weight 469 and patient is now at 420.  He is doing extremely well on medication.  Needs refills today. PDMP checked with no concerns.  Refills of 15mg  Phentermine and 100mg  Topiramate sent in.      Relevant Medications   topiramate (TOPAMAX) 100 MG tablet   phentermine 15 MG capsule    Vitals Temp: 98.6 F (37 C) BP: 108/72 Pulse Rate: 83 SpO2: 97 %   Anthropometric Measurements Height: 6\' 4"  (1.93 m) Weight: (!) 420 lb (190.5 kg) BMI (Calculated): 51.15 Weight at Last Visit: 423 lb Weight Lost Since Last Visit: 3 Weight Gained Since Last Visit: 0 Starting Weight: 477 Total Weight Loss (lbs): 54 lb (24.5 kg)   Body Composition  Body Fat %: 45.6 % Fat Mass (lbs): 191.6 lbs Muscle Mass (lbs):  217.6 lbs Total Body Water (lbs): 183.3 lbs Visceral Fat Rating : 31   Other Clinical Data Today's Visit #: 25 Starting Date: 12/02/21     ASSESSMENT AND PLAN:  Diet: Carnell is currently in the action stage of change. As such, his goal is to continue with weight loss efforts. He has agreed to Category 4 Plan.  Exercise: Kentravious has been instructed to work up to a goal of 150 minutes of combined cardio and strengthening exercise per week and that some exercise is better than none for weight loss and overall health benefits.   Behavior Modification:  We discussed the following Behavioral Modification Strategies today: increasing lean protein intake, increasing vegetables, meal planning and cooking strategies, avoiding temptations, and planning for success. We discussed various medication options to help Spike with his weight loss efforts and we both agreed to get back on medication of phentermine and topiramate at previous dosage.  No follow-ups on file.Marland Kitchen He was informed of the importance of frequent follow up visits to maximize his success with intensive lifestyle modifications for his multiple health conditions.  Attestation Statements:   Reviewed by clinician on day of visit: allergies, medications, problem list, medical history, surgical history, family history, social history, and previous encounter notes.      Reuben Likes, MD

## 2023-11-15 NOTE — Assessment & Plan Note (Signed)
Patients blood pressure has been consistently well controlled which he attributes to weight loss, meditation and yoga.  If BP stays well controlled at next appointment will decrease avalide to 150-12.5mg  dose.

## 2023-11-15 NOTE — Assessment & Plan Note (Signed)
Ran out of medication since last appointment.  Starting weight 469 and patient is now at 420.  He is doing extremely well on medication.  Needs refills today. PDMP checked with no concerns.  Refills of 15mg  Phentermine and 100mg  Topiramate sent in.

## 2023-11-17 ENCOUNTER — Other Ambulatory Visit (INDEPENDENT_AMBULATORY_CARE_PROVIDER_SITE_OTHER): Payer: Self-pay | Admitting: Family Medicine

## 2023-11-17 DIAGNOSIS — I10 Essential (primary) hypertension: Secondary | ICD-10-CM

## 2023-11-21 ENCOUNTER — Other Ambulatory Visit (INDEPENDENT_AMBULATORY_CARE_PROVIDER_SITE_OTHER): Payer: Self-pay | Admitting: Family Medicine

## 2023-11-21 DIAGNOSIS — I10 Essential (primary) hypertension: Secondary | ICD-10-CM

## 2023-11-23 ENCOUNTER — Other Ambulatory Visit (INDEPENDENT_AMBULATORY_CARE_PROVIDER_SITE_OTHER): Payer: Self-pay | Admitting: Family Medicine

## 2023-11-23 DIAGNOSIS — I10 Essential (primary) hypertension: Secondary | ICD-10-CM

## 2023-11-23 DIAGNOSIS — E559 Vitamin D deficiency, unspecified: Secondary | ICD-10-CM

## 2023-12-13 ENCOUNTER — Ambulatory Visit (INDEPENDENT_AMBULATORY_CARE_PROVIDER_SITE_OTHER): Payer: BC Managed Care – PPO | Admitting: Family Medicine

## 2023-12-13 ENCOUNTER — Encounter (INDEPENDENT_AMBULATORY_CARE_PROVIDER_SITE_OTHER): Payer: Self-pay | Admitting: Family Medicine

## 2023-12-13 VITALS — BP 129/82 | HR 94 | Temp 97.9°F | Ht 76.0 in | Wt >= 6400 oz

## 2023-12-13 DIAGNOSIS — I10 Essential (primary) hypertension: Secondary | ICD-10-CM | POA: Diagnosis not present

## 2023-12-13 DIAGNOSIS — E559 Vitamin D deficiency, unspecified: Secondary | ICD-10-CM | POA: Diagnosis not present

## 2023-12-13 DIAGNOSIS — R632 Polyphagia: Secondary | ICD-10-CM | POA: Diagnosis not present

## 2023-12-13 DIAGNOSIS — R7303 Prediabetes: Secondary | ICD-10-CM | POA: Diagnosis not present

## 2023-12-13 DIAGNOSIS — Z6841 Body Mass Index (BMI) 40.0 and over, adult: Secondary | ICD-10-CM

## 2023-12-13 MED ORDER — CHOLECALCIFEROL 1.25 MG (50000 UT) PO TABS: 1.0000 | ORAL_TABLET | ORAL | 0 refills | Status: DC

## 2023-12-13 MED ORDER — PHENTERMINE HCL 15 MG PO CAPS: 15.0000 mg | ORAL_CAPSULE | Freq: Every day | ORAL | 0 refills | Status: DC

## 2023-12-13 MED ORDER — TOPIRAMATE 100 MG PO TABS: 100.0000 mg | ORAL_TABLET | Freq: Every day | ORAL | 0 refills | Status: DC

## 2023-12-13 MED ORDER — METFORMIN HCL 500 MG PO TABS: 500.0000 mg | ORAL_TABLET | Freq: Every day | ORAL | 0 refills | Status: DC

## 2023-12-13 MED ORDER — VERAPAMIL HCL ER 180 MG PO CP24
180.0000 mg | ORAL_CAPSULE | Freq: Every day | ORAL | 0 refills | Status: DC
Start: 2023-12-13 — End: 2024-01-17

## 2023-12-13 NOTE — Progress Notes (Signed)
 SUBJECTIVE:  Chief Complaint: Obesity  Interim History: Patient feels good with his ability to get all the food on plan in.  He is exercising consistently.  He is enjoying yoga that he is doing.  He is doing a protein shake in the am and getting in a large quantity at supper.   Alexis [name] is here to discuss his progress with his obesity treatment plan. He is on the Category 4 Plan and states he is following his eating plan approximately 80 % of the time. He states he is exercising 120 minutes per week.   OBJECTIVE: Visit Diagnoses: Problem List Items Addressed This Visit       Cardiovascular and Mediastinum   Essential hypertension   Blood pressure well controlled on current medication.  Needs refill of lower dose verapamil.  Will continue to monitor blood pressure at subsequent upcoming appointments.      Relevant Medications   verapamil (VERELAN) 180 MG 24 hr capsule     Other   Morbid obesity (HCC)   Anthropometric Measurements Height: 6\' 4"  (1.93 m) Weight: (!) 411 lb (186.4 kg) BMI (Calculated): 50.05 Weight at Last Visit: 420 lb Weight Lost Since Last Visit: 9 Weight Gained Since Last Visit: 0 Starting Weight: 477 lb Total Weight Loss (lbs): 66 lb (29.9 kg) Body Composition  Body Fat %: 43.9 % Fat Mass (lbs): 180.4 lbs Muscle Mass (lbs): 219.4 lbs Total Body Water (lbs): 175 lbs Visceral Fat Rating : 29 Other Clinical Data Today's Visit #: 26 Starting Date: 12/02/21 Comments: Cat 4       Relevant Medications   metFORMIN (GLUCOPHAGE) 500 MG tablet   phentermine 15 MG capsule   Polyphagia   Starting weight 469 and patient is now at 411.  He is doing extremely well on medication.  Needs refills today. PDMP checked with no concerns.  Refills of 15mg  Phentermine and 100mg  Topiramate sent in.      Relevant Medications   phentermine 15 MG capsule   topiramate (TOPAMAX) 100 MG tablet   Prediabetes - Primary   Doing well on metformin- no GI side effects  mentioned.  Needs refill today- no change in dose as patient is well controlled      Relevant Medications   metFORMIN (GLUCOPHAGE) 500 MG tablet   Vitamin D deficiency   Tolerating cholecalciferol with no issues.  Needs refill today.      Relevant Medications   Cholecalciferol 1.25 MG (50000 UT) TABS   Other Visit Diagnoses       BMI 50.0-59.9, adult (HCC)       Relevant Medications   metFORMIN (GLUCOPHAGE) 500 MG tablet   phentermine 15 MG capsule       No data recorded       ASSESSMENT AND PLAN:  Diet: Jayleen is currently in the action stage of change. As such, his goal is to continue with weight loss efforts and has agreed to the Category 4 Plan.   Exercise:  For substantial health benefits, adults should do at least 150 minutes (2 hours and 30 minutes) a week of moderate-intensity, or 75 minutes (1 hour and 15 minutes) a week of vigorous-intensity aerobic physical activity, or an equivalent combination of moderate- and vigorous-intensity aerobic activity. Aerobic activity should be performed in episodes of at least 10 minutes, and preferably, it should be spread throughout the week.  Behavior Modification:  We discussed the following Behavioral Modification Strategies today: increasing lean protein intake, increasing vegetables, meal planning and cooking strategies,  and planning for success. We discussed various medication options to help Hardin with his weight loss efforts and we both agreed to continue current doses of phentermine and topiramate.  No follow-ups on file.Marland Kitchen He was informed of the importance of frequent follow up visits to maximize his success with intensive lifestyle modifications for his multiple health conditions.  Attestation Statements:   Reviewed by clinician on day of visit: allergies, medications, problem list, medical history, surgical history, family history, social history, and previous encounter notes.    Reuben Likes, MD

## 2023-12-21 NOTE — Assessment & Plan Note (Signed)
 Blood pressure well controlled on current medication.  Needs refill of lower dose verapamil.  Will continue to monitor blood pressure at subsequent upcoming appointments.

## 2023-12-21 NOTE — Assessment & Plan Note (Signed)
 Anthropometric Measurements Height: 6\' 4"  (1.93 m) Weight: (!) 411 lb (186.4 kg) BMI (Calculated): 50.05 Weight at Last Visit: 420 lb Weight Lost Since Last Visit: 9 Weight Gained Since Last Visit: 0 Starting Weight: 477 lb Total Weight Loss (lbs): 66 lb (29.9 kg) Body Composition  Body Fat %: 43.9 % Fat Mass (lbs): 180.4 lbs Muscle Mass (lbs): 219.4 lbs Total Body Water (lbs): 175 lbs Visceral Fat Rating : 29 Other Clinical Data Today's Visit #: 26 Starting Date: 12/02/21 Comments: Cat 4

## 2023-12-21 NOTE — Assessment & Plan Note (Signed)
 Tolerating cholecalciferol with no issues.  Needs refill today.

## 2023-12-21 NOTE — Assessment & Plan Note (Signed)
 Starting weight 469 and patient is now at 411.  He is doing extremely well on medication.  Needs refills today. PDMP checked with no concerns.  Refills of 15mg  Phentermine and 100mg  Topiramate sent in.

## 2023-12-21 NOTE — Assessment & Plan Note (Signed)
 Doing well on metformin- no GI side effects mentioned.  Needs refill today- no change in dose as patient is well controlled

## 2023-12-27 ENCOUNTER — Ambulatory Visit (INDEPENDENT_AMBULATORY_CARE_PROVIDER_SITE_OTHER): Payer: BC Managed Care – PPO | Admitting: Family Medicine

## 2024-01-11 ENCOUNTER — Other Ambulatory Visit (INDEPENDENT_AMBULATORY_CARE_PROVIDER_SITE_OTHER): Payer: Self-pay | Admitting: Family Medicine

## 2024-01-11 DIAGNOSIS — I10 Essential (primary) hypertension: Secondary | ICD-10-CM

## 2024-01-15 ENCOUNTER — Other Ambulatory Visit (INDEPENDENT_AMBULATORY_CARE_PROVIDER_SITE_OTHER): Payer: Self-pay | Admitting: Family Medicine

## 2024-01-15 DIAGNOSIS — E559 Vitamin D deficiency, unspecified: Secondary | ICD-10-CM

## 2024-01-17 ENCOUNTER — Ambulatory Visit (INDEPENDENT_AMBULATORY_CARE_PROVIDER_SITE_OTHER): Admitting: Family Medicine

## 2024-01-17 ENCOUNTER — Encounter (INDEPENDENT_AMBULATORY_CARE_PROVIDER_SITE_OTHER): Payer: Self-pay | Admitting: Family Medicine

## 2024-01-17 VITALS — BP 100/69 | HR 91 | Temp 98.2°F | Ht 76.0 in | Wt >= 6400 oz

## 2024-01-17 DIAGNOSIS — I10 Essential (primary) hypertension: Secondary | ICD-10-CM | POA: Diagnosis not present

## 2024-01-17 DIAGNOSIS — R632 Polyphagia: Secondary | ICD-10-CM

## 2024-01-17 DIAGNOSIS — Z6841 Body Mass Index (BMI) 40.0 and over, adult: Secondary | ICD-10-CM

## 2024-01-17 DIAGNOSIS — E559 Vitamin D deficiency, unspecified: Secondary | ICD-10-CM | POA: Diagnosis not present

## 2024-01-17 MED ORDER — PHENTERMINE HCL 15 MG PO CAPS: 15.0000 mg | ORAL_CAPSULE | Freq: Every day | ORAL | 0 refills | Status: DC

## 2024-01-17 MED ORDER — TOPIRAMATE 100 MG PO TABS: 100.0000 mg | ORAL_TABLET | Freq: Every day | ORAL | 0 refills | Status: DC

## 2024-01-17 MED ORDER — CHOLECALCIFEROL 1.25 MG (50000 UT) PO TABS: 1.0000 | ORAL_TABLET | ORAL | 0 refills | Status: DC

## 2024-01-17 NOTE — Progress Notes (Signed)
 SUBJECTIVE:  Chief Complaint: Obesity  Interim History: Patient has been incorporated strength and core classes at work- which is different than he did previously. Still doing about 85% on plan.  He has not been hungry for the most.  He feels like he is in a good spot with how the medicine is working.  Alexis [name] is here to discuss his progress with his obesity treatment plan. He is on the Category 4 Plan and states he is following his eating plan approximately 85 % of the time. He states he is exercising 120 minutes per week.   OBJECTIVE: Visit Diagnoses: Problem List Items Addressed This Visit       Cardiovascular and Mediastinum   Essential hypertension   Patient's blood pressure very well controlled today.  No chest pain, chest pressure, headache, lightheadedness or dizziness.  Will stop verapamil and follow up on BP in 1 month.        Other   Morbid obesity (HCC)   Relevant Medications   phentermine 15 MG capsule   Polyphagia - Primary   Patient doing well on phentermine and topiramate daily.  PDMP checked with no concerns.  Starting weight 469 on medication and now at 404.  PDMP checked- no concerns.  Will refill phentermine 15mg  and topiramate 100mg  today.  We discussed how to space out appointments and patient is not ready at this time to space out more than a month between appointments.      Relevant Medications   topiramate (TOPAMAX) 100 MG tablet   phentermine 15 MG capsule   Vitamin D deficiency   Doing well on prescription strength Vitamin D- needs refill today.  No nausea, vomiting or muscle weakness.  Continue current prescription.      Relevant Medications   Cholecalciferol 1.25 MG (50000 UT) TABS   Other Visit Diagnoses       BMI 45.0-49.9, adult (HCC)       Relevant Medications   phentermine 15 MG capsule       Vitals Temp: 98.2 F (36.8 C) BP: 100/69 Pulse Rate: 91 SpO2: 96 %   Anthropometric Measurements Height: 6\' 4"  (1.93 m) Weight: (!)  404 lb (183.3 kg) BMI (Calculated): 49.2 Weight at Last Visit: 411 lb Weight Lost Since Last Visit: 7 Weight Gained Since Last Visit: 0 Starting Weight: 477 lb Total Weight Loss (lbs): 73 lb (33.1 kg)   Body Composition  Body Fat %: 43.4 % Fat Mass (lbs): 175.4 lbs Muscle Mass (lbs): 217.6 lbs Total Body Water (lbs): 174.02 lbs Visceral Fat Rating : 28   Other Clinical Data Today's Visit #: 27 Starting Date: 12/02/21 Comments: Cat 4     ASSESSMENT AND PLAN:  Diet: Cameo is currently in the action stage of change. As such, his goal is to continue with weight loss efforts and has agreed to the Category 4 Plan.   Exercise:  For substantial health benefits, adults should do at least 150 minutes (2 hours and 30 minutes) a week of moderate-intensity, or 75 minutes (1 hour and 15 minutes) a week of vigorous-intensity aerobic physical activity, or an equivalent combination of moderate- and vigorous-intensity aerobic activity. Aerobic activity should be performed in episodes of at least 10 minutes, and preferably, it should be spread throughout the week.  Behavior Modification:  We discussed the following Behavioral Modification Strategies today: increasing lean protein intake, increasing vegetables, meal planning and cooking strategies, avoiding temptations, and planning for success. We discussed various medication options to help DeLand with  his weight loss efforts and we both agreed to continue current medication at same dose of phentermine or topiramate.  Return in about 4 weeks (around 02/14/2024).Marland Kitchen He was informed of the importance of frequent follow up visits to maximize his success with intensive lifestyle modifications for his multiple health conditions.  Attestation Statements:   Reviewed by clinician on day of visit: allergies, medications, problem list, medical history, surgical history, family history, social history, and previous encounter notes.    Reuben Likes,  MD

## 2024-01-17 NOTE — Assessment & Plan Note (Signed)
 Patient doing well on phentermine and topiramate daily.  PDMP checked with no concerns.  Starting weight 469 on medication and now at 404.  PDMP checked- no concerns.  Will refill phentermine 15mg  and topiramate 100mg  today.  We discussed how to space out appointments and patient is not ready at this time to space out more than a month between appointments.

## 2024-01-17 NOTE — Assessment & Plan Note (Signed)
 Patient's blood pressure very well controlled today.  No chest pain, chest pressure, headache, lightheadedness or dizziness.  Will stop verapamil and follow up on BP in 1 month.

## 2024-01-17 NOTE — Assessment & Plan Note (Signed)
 Doing well on prescription strength Vitamin D- needs refill today.  No nausea, vomiting or muscle weakness.  Continue current prescription.

## 2024-02-11 ENCOUNTER — Other Ambulatory Visit (INDEPENDENT_AMBULATORY_CARE_PROVIDER_SITE_OTHER): Payer: Self-pay | Admitting: Family Medicine

## 2024-02-11 DIAGNOSIS — E559 Vitamin D deficiency, unspecified: Secondary | ICD-10-CM

## 2024-02-14 ENCOUNTER — Encounter (INDEPENDENT_AMBULATORY_CARE_PROVIDER_SITE_OTHER): Payer: Self-pay | Admitting: Family Medicine

## 2024-02-14 ENCOUNTER — Ambulatory Visit (INDEPENDENT_AMBULATORY_CARE_PROVIDER_SITE_OTHER): Admitting: Family Medicine

## 2024-02-14 VITALS — BP 113/79 | HR 101 | Temp 98.9°F | Ht 76.0 in | Wt 396.0 lb

## 2024-02-14 DIAGNOSIS — E559 Vitamin D deficiency, unspecified: Secondary | ICD-10-CM | POA: Diagnosis not present

## 2024-02-14 DIAGNOSIS — E66813 Obesity, class 3: Secondary | ICD-10-CM

## 2024-02-14 DIAGNOSIS — R632 Polyphagia: Secondary | ICD-10-CM

## 2024-02-14 DIAGNOSIS — Z6841 Body Mass Index (BMI) 40.0 and over, adult: Secondary | ICD-10-CM

## 2024-02-14 MED ORDER — CHOLECALCIFEROL 1.25 MG (50000 UT) PO TABS: 1.0000 | ORAL_TABLET | ORAL | 0 refills | Status: DC

## 2024-02-14 MED ORDER — PHENTERMINE HCL 15 MG PO CAPS: 15.0000 mg | ORAL_CAPSULE | Freq: Every day | ORAL | 0 refills | Status: DC

## 2024-02-14 MED ORDER — TOPIRAMATE 100 MG PO TABS: 100.0000 mg | ORAL_TABLET | Freq: Every day | ORAL | 0 refills | Status: DC

## 2024-02-14 NOTE — Progress Notes (Signed)
 SUBJECTIVE:  Chief Complaint: Obesity  Interim History: Patient is doing well focusing on meal plan over the last month.  Last month has been pretty busy per patient.  He is trying to keep up. He has been staying consistent with his intake and denies anything different.  Nothing planned for the next few weeks except tending to his garden.   Alexis [name] is here to discuss his progress with his obesity treatment plan. He is on the Category 4 Plan and states he is following his eating plan approximately 85 % of the time. He states he is exercising 120 minutes per week- yoga and core and stretch at work.  He is experiencing muscle soreness.   OBJECTIVE: Visit Diagnoses: Problem List Items Addressed This Visit       Other   Polyphagia   Patient doing well on phentermine  and topiramate  daily.  PDMP checked with no concerns.  Starting weight 469 on medication and now at 396.  Refill phentermine  15mg  and topiramate  100mg  today.  1 month prescription sent in today.      Relevant Medications   topiramate  (TOPAMAX ) 100 MG tablet   phentermine  15 MG capsule   Vitamin D  deficiency   On prescription strength Vitamin D .  Needs a refill of Rx today.  No nausea, vomiting or muscle weakness noted today.      Relevant Medications   Cholecalciferol  1.25 MG (50000 UT) TABS   Class 3 severe obesity with serious comorbidity and body mass index (BMI) of 50.0 to 59.9 in adult - Primary   Anthropometric Measurements Height: 6\' 4"  (1.93 m) Weight: (!) 396 lb (179.6 kg) BMI (Calculated): 48.22 Weight at Last Visit: 404 lb Weight Lost Since Last Visit: 8 Weight Gained Since Last Visit: 0 Starting Weight: 477 lb Total Weight Loss (lbs): 81 lb (36.7 kg) Body Composition  Body Fat %: 42.9 % Fat Mass (lbs): 170 lbs Muscle Mass (lbs): 215.2 lbs Total Body Water (lbs): 170.4 lbs Visceral Fat Rating : 28 Other Clinical Data Today's Visit #: 28 Starting Date: 12/02/21 Comments: Cat 4       Relevant  Medications   phentermine  15 MG capsule    No data recorded       02/14/2024    3:00 PM 01/17/2024    3:00 PM 12/13/2023    3:00 PM  Vitals with BMI  Height 6\' 4"  6\' 4"  6\' 4"   Weight 396 lbs 404 lbs 411 lbs  BMI 48.22 49.2 50.05  Systolic 113 100 914  Diastolic 79 69 82  Pulse 101 91 94      ASSESSMENT AND PLAN:  Diet: Bradlee is currently in the action stage of change. As such, his goal is to continue with weight loss efforts and has agreed to the Category 4 Plan.   Exercise:  For substantial health benefits, adults should do at least 150 minutes (2 hours and 30 minutes) a week of moderate-intensity, or 75 minutes (1 hour and 15 minutes) a week of vigorous-intensity aerobic physical activity, or an equivalent combination of moderate- and vigorous-intensity aerobic activity. Aerobic activity should be performed in episodes of at least 10 minutes, and preferably, it should be spread throughout the week.  Behavior Modification:  We discussed the following Behavioral Modification Strategies today: increasing lean protein intake, decreasing simple carbohydrates, increasing vegetables, meal planning and cooking strategies, keeping healthy foods in the home, and planning for success. We discussed various medication options to help Kalee with his weight loss efforts and we  both agreed to continue current medication at same dose.  Return in about 4 weeks (around 03/13/2024) for fasting labs and IC.   He was informed of the importance of frequent follow up visits to maximize his success with intensive lifestyle modifications for his multiple health conditions.  Attestation Statements:   Reviewed by clinician on day of visit: allergies, medications, problem list, medical history, surgical history, family history, social history, and previous encounter notes.     Donaciano Frizzle, MD

## 2024-02-20 NOTE — Assessment & Plan Note (Signed)
 Patient doing well on phentermine  and topiramate  daily.  PDMP checked with no concerns.  Starting weight 469 on medication and now at 396.  Refill phentermine  15mg  and topiramate  100mg  today.  1 month prescription sent in today.

## 2024-02-20 NOTE — Assessment & Plan Note (Signed)
 On prescription strength Vitamin D .  Needs a refill of Rx today.  No nausea, vomiting or muscle weakness noted today.

## 2024-02-20 NOTE — Assessment & Plan Note (Signed)
 Anthropometric Measurements Height: 6\' 4"  (1.93 m) Weight: (!) 396 lb (179.6 kg) BMI (Calculated): 48.22 Weight at Last Visit: 404 lb Weight Lost Since Last Visit: 8 Weight Gained Since Last Visit: 0 Starting Weight: 477 lb Total Weight Loss (lbs): 81 lb (36.7 kg) Body Composition  Body Fat %: 42.9 % Fat Mass (lbs): 170 lbs Muscle Mass (lbs): 215.2 lbs Total Body Water (lbs): 170.4 lbs Visceral Fat Rating : 28 Other Clinical Data Today's Visit #: 28 Starting Date: 12/02/21 Comments: Cat 4

## 2024-03-10 LAB — BASIC METABOLIC PANEL WITH GFR
BUN: 10 (ref 4–21)
CO2: 19 (ref 13–22)
Chloride: 102 (ref 99–108)
Creatinine: 1.1 (ref 0.6–1.3)
Glucose: 85
Potassium: 3.4 meq/L — AB (ref 3.5–5.1)
Sodium: 139 (ref 137–147)

## 2024-03-10 LAB — VITAMIN D 25 HYDROXY (VIT D DEFICIENCY, FRACTURES): Vit D, 25-Hydroxy: 74.8

## 2024-03-10 LAB — HEPATIC FUNCTION PANEL
ALT: 29 U/L (ref 10–40)
AST: 25 (ref 14–40)
Alkaline Phosphatase: 73 (ref 25–125)
Bilirubin, Total: 0.8

## 2024-03-10 LAB — COMPREHENSIVE METABOLIC PANEL WITH GFR
Albumin: 4.1 (ref 3.5–5.0)
Calcium: 9.3 (ref 8.7–10.7)
EGFR: 88
Globulin: 3
HM HIV Screening: NEGATIVE

## 2024-03-10 LAB — LIPID PANEL
Cholesterol: 179 (ref 0–200)
HDL: 40 (ref 35–70)
LDL Cholesterol: 114
Triglycerides: 142 (ref 40–160)

## 2024-03-10 LAB — IRON,TIBC AND FERRITIN PANEL
Ferritin: 91
TIBC: 46
UIBC: 267

## 2024-03-10 LAB — CBC AND DIFFERENTIAL
HCT: 49 (ref 41–53)
Hemoglobin: 15.3 (ref 13.5–17.5)
Neutrophils Absolute: 5.4
Platelets: 262 10*3/uL (ref 150–400)
WBC: 7.9

## 2024-03-10 LAB — TSH: TSH: 2.19 (ref 0.41–5.90)

## 2024-03-10 LAB — VITAMIN B12: Vitamin B-12: 638

## 2024-03-10 LAB — HIV ANTIBODY (ROUTINE TESTING W REFLEX): HIV 1&2 Ab, 4th Generation: NONREACTIVE

## 2024-03-10 LAB — CBC: RBC: 5.8 — AB (ref 3.87–5.11)

## 2024-03-10 LAB — HEMOGLOBIN A1C: Hemoglobin A1C: 5.5

## 2024-03-14 ENCOUNTER — Ambulatory Visit (INDEPENDENT_AMBULATORY_CARE_PROVIDER_SITE_OTHER): Admitting: Family Medicine

## 2024-03-14 ENCOUNTER — Encounter (INDEPENDENT_AMBULATORY_CARE_PROVIDER_SITE_OTHER): Payer: Self-pay | Admitting: Family Medicine

## 2024-03-14 VITALS — BP 112/76 | HR 82 | Temp 98.5°F | Ht 76.0 in | Wt 396.0 lb

## 2024-03-14 DIAGNOSIS — R632 Polyphagia: Secondary | ICD-10-CM

## 2024-03-14 DIAGNOSIS — Z6841 Body Mass Index (BMI) 40.0 and over, adult: Secondary | ICD-10-CM

## 2024-03-14 DIAGNOSIS — R0602 Shortness of breath: Secondary | ICD-10-CM

## 2024-03-14 DIAGNOSIS — E559 Vitamin D deficiency, unspecified: Secondary | ICD-10-CM

## 2024-03-14 DIAGNOSIS — R7303 Prediabetes: Secondary | ICD-10-CM

## 2024-03-14 MED ORDER — METFORMIN HCL 500 MG PO TABS
500.0000 mg | ORAL_TABLET | Freq: Every day | ORAL | 0 refills | Status: DC
Start: 2024-03-14 — End: 2024-04-25

## 2024-03-14 MED ORDER — PHENTERMINE HCL 15 MG PO CAPS: 15.0000 mg | ORAL_CAPSULE | Freq: Every day | ORAL | 0 refills | Status: DC

## 2024-03-14 MED ORDER — TOPIRAMATE 100 MG PO TABS: 100.0000 mg | ORAL_TABLET | Freq: Every day | ORAL | 0 refills | Status: DC

## 2024-03-14 MED ORDER — CHOLECALCIFEROL 1.25 MG (50000 UT) PO TABS
1.0000 | ORAL_TABLET | ORAL | 0 refills | Status: DC
Start: 2024-03-14 — End: 2024-04-25

## 2024-03-14 NOTE — Assessment & Plan Note (Signed)
 No GI side effects of metformin .  Will need refill today.  Can continue current dose.

## 2024-03-14 NOTE — Assessment & Plan Note (Signed)
 Patient doing well on phentermine  and topiramate  daily.  PDMP checked with no concerns.  Starting weight 469 on medication and now at 396.  Refill phentermine  15mg  and topiramate  100mg  today.  1 month prescription sent in today.  Patient feeling in control of food choices and limiting indulgence.

## 2024-03-14 NOTE — Assessment & Plan Note (Signed)
 No nausea, vomiting or muscle weakness mentioned by patient.  Needs a refill of Vitamin D  today.  No change in dose.

## 2024-03-14 NOTE — Assessment & Plan Note (Signed)
 Prior RMR by indirect calorimetry at 3269 on 12/02/21.  Repeat RMR done today by indirect calorimetry at 3168 showing still a significantly elevated RMR over estimation and minimal decrease despite significant weight loss.  [employer] done at work showing RMR at [employer].  Will continue current treatment plan of Category 4.

## 2024-03-14 NOTE — Progress Notes (Signed)
 SUBJECTIVE:  Chief Complaint: Obesity  Interim History: Patient had a very busy month at work in May- record month.  He tried to maintain everything with food and physical activity wise.  He mentions the increased demand on him at work actually was nice to be more busy.  He is still working on getting all the food in throughout the day.  No hunger or cravings. Nothing going on for the month of June except getting ready for summer.   Alexis [name] is here to discuss his progress with his obesity treatment plan. He is on the Category 4 Plan and states he is following his eating plan approximately 85 % of the time. He states he is exercising 45-60 minutes 3 times per week.   OBJECTIVE: Visit Diagnoses: Problem List Items Addressed This Visit       Other   Morbid obesity (HCC)   Relevant Medications   metFORMIN  (GLUCOPHAGE ) 500 MG tablet   phentermine  15 MG capsule   SOB (shortness of breath) - Primary   Prior RMR by indirect calorimetry at 3269 on 12/02/21.  Repeat RMR done today by indirect calorimetry at 3168 showing still a significantly elevated RMR over estimation and minimal decrease despite significant weight loss.  [employer] done at work showing RMR at [employer].  Will continue current treatment plan of Category 4.      Polyphagia   Patient doing well on phentermine  and topiramate  daily.  PDMP checked with no concerns.  Starting weight 469 on medication and now at 396.  Refill phentermine  15mg  and topiramate  100mg  today.  1 month prescription sent in today.  Patient feeling in control of food choices and limiting indulgence.      Relevant Medications   phentermine  15 MG capsule   topiramate  (TOPAMAX ) 100 MG tablet   Prediabetes   No GI side effects of metformin .  Will need refill today.  Can continue current dose.        Relevant Medications   metFORMIN  (GLUCOPHAGE ) 500 MG tablet   Vitamin D  deficiency   No nausea, vomiting or muscle weakness mentioned by patient.  Needs a refill of  Vitamin D  today.  No change in dose.      Relevant Medications   Cholecalciferol  1.25 MG (50000 UT) TABS   Other Visit Diagnoses       BMI 45.0-49.9, adult (HCC)       Relevant Medications   metFORMIN  (GLUCOPHAGE ) 500 MG tablet   phentermine  15 MG capsule       Vitals Temp: 98.5 F (36.9 C) BP: 112/76 Pulse Rate: 82 SpO2: 98 %   Anthropometric Measurements Height: 6\' 4"  (1.93 m) Weight: (!) 396 lb (179.6 kg) BMI (Calculated): 48.22 Weight at Last Visit: 396 lb Weight Lost Since Last Visit: 0 Weight Gained Since Last Visit: 0 Starting Weight: 477 lb Total Weight Loss (lbs): 81 lb (36.7 kg)   Body Composition  Body Fat %: 43.6 % Fat Mass (lbs): 173 lbs Muscle Mass (lbs): 213 lbs Total Body Water (lbs): 171.2 lbs Visceral Fat Rating : 28   Other Clinical Data RMR: 3168 Fasting: yes Labs: yes Today's Visit #: 29 Starting Date: 12/02/21 Comments: Cat 4     ASSESSMENT AND PLAN:  Diet: Alexis [name] is currently in the action stage of change. As such, his goal is to continue with weight loss efforts and has agreed to the Category 4 Plan.   Exercise:  For substantial health benefits, adults should do at least 150 minutes (2 hours  and 30 minutes) a week of moderate-intensity, or 75 minutes (1 hour and 15 minutes) a week of vigorous-intensity aerobic physical activity, or an equivalent combination of moderate- and vigorous-intensity aerobic activity. Aerobic activity should be performed in episodes of at least 10 minutes, and preferably, it should be spread throughout the week.  Behavior Modification:  We discussed the following Behavioral Modification Strategies today: increasing lean protein intake, decreasing simple carbohydrates, increasing vegetables, meal planning and cooking strategies, keeping healthy foods in the home, and planning for success. We discussed various medication options to help Alexis [name] with his weight loss efforts and we both agreed to continue  phentermine  and topiramate  at current doses.  Return in about 4 weeks (around 04/11/2024).   He was informed of the importance of frequent follow up visits to maximize his success with intensive lifestyle modifications for his multiple health conditions.  Attestation Statements:   Reviewed by clinician on day of visit: allergies, medications, problem list, medical history, surgical history, family history, social history, and previous encounter notes.   Alexis Frizzle, MD

## 2024-04-08 ENCOUNTER — Other Ambulatory Visit (INDEPENDENT_AMBULATORY_CARE_PROVIDER_SITE_OTHER): Payer: Self-pay | Admitting: Family Medicine

## 2024-04-08 DIAGNOSIS — E559 Vitamin D deficiency, unspecified: Secondary | ICD-10-CM

## 2024-04-25 ENCOUNTER — Ambulatory Visit (INDEPENDENT_AMBULATORY_CARE_PROVIDER_SITE_OTHER): Admitting: Family Medicine

## 2024-04-25 ENCOUNTER — Encounter (INDEPENDENT_AMBULATORY_CARE_PROVIDER_SITE_OTHER): Payer: Self-pay | Admitting: Family Medicine

## 2024-04-25 VITALS — BP 104/70 | HR 87 | Temp 98.4°F | Ht 76.0 in | Wt 386.0 lb

## 2024-04-25 DIAGNOSIS — E559 Vitamin D deficiency, unspecified: Secondary | ICD-10-CM

## 2024-04-25 DIAGNOSIS — R454 Irritability and anger: Secondary | ICD-10-CM

## 2024-04-25 DIAGNOSIS — R632 Polyphagia: Secondary | ICD-10-CM

## 2024-04-25 DIAGNOSIS — E669 Obesity, unspecified: Secondary | ICD-10-CM

## 2024-04-25 DIAGNOSIS — E876 Hypokalemia: Secondary | ICD-10-CM

## 2024-04-25 DIAGNOSIS — R7303 Prediabetes: Secondary | ICD-10-CM | POA: Diagnosis not present

## 2024-04-25 DIAGNOSIS — E66813 Obesity, class 3: Secondary | ICD-10-CM

## 2024-04-25 DIAGNOSIS — Z6841 Body Mass Index (BMI) 40.0 and over, adult: Secondary | ICD-10-CM

## 2024-04-25 MED ORDER — PHENTERMINE HCL 15 MG PO CAPS: 15.0000 mg | ORAL_CAPSULE | Freq: Every day | ORAL | 0 refills | Status: DC

## 2024-04-25 MED ORDER — ESCITALOPRAM OXALATE 10 MG PO TABS: 10.0000 mg | ORAL_TABLET | Freq: Every day | ORAL | 0 refills | Status: DC

## 2024-04-25 MED ORDER — METFORMIN HCL 500 MG PO TABS: 500.0000 mg | ORAL_TABLET | Freq: Every day | ORAL | 0 refills | Status: DC

## 2024-04-25 MED ORDER — CHOLECALCIFEROL 1.25 MG (50000 UT) PO TABS: 1.0000 | ORAL_TABLET | ORAL | 0 refills | Status: AC

## 2024-04-25 MED ORDER — TOPIRAMATE 100 MG PO TABS: 100.0000 mg | ORAL_TABLET | Freq: Every day | ORAL | 0 refills | Status: DC

## 2024-04-25 NOTE — Progress Notes (Signed)
 Office: [phone]  /  Fax: [phone]  WEIGHT SUMMARY AND BIOMETRICS  Anthropometric Measurements Height: 6' 4 (1.93 m) Weight: (!) 386 lb (175.1 kg) BMI (Calculated): 47 Weight at Last Visit: 396 lb Weight Lost Since Last Visit: 10 lb Weight Gained Since Last Visit: 0 Starting Weight: 477 lb Total Weight Loss (lbs): 91 lb (41.3 kg) Peak Weight: 498 lb   Body Composition  Body Fat %: 42.5 % Fat Mass (lbs): 164 lbs Muscle Mass (lbs): 211.2 lbs Total Body Water (lbs): 170.8 lbs Visceral Fat Rating : 27   Other Clinical Data RMR: 3168 Fasting: no Labs: no Today's Visit #: 30 Starting Date: 12/02/21 Comments: cat 4    Chief Complaint: OBESITY   Discussed the use of AI scribe software for clinical note transcription with the patient, who gave verbal consent to proceed.  History of Present Illness Alexis [name] is a 38 year old male who presents for obesity treatment and progress assessment.  He has been following a category four eating plan with 80% adherence, resulting in a 10-pound weight loss over the past five weeks. He finds it challenging to consume all the recommended food, particularly protein, but supplements with protein shakes and snacks like beef jerky and nuts to meet his nutritional needs.  He engages in yoga twice a week and core and strength training once a week for 45 minutes. 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  He is being treated for prediabetes with metformin  500 mg daily and reports no gastrointestinal issues since the initial adjustment period. His A1c is 5.5, and his insulin  levels have decreased from 64 to 33.  He has a history of vitamin D  deficiency and is on prescription vitamin D  50,000 IU weekly. His vitamin D  level is 74, and he requests a refill.  He has a history of polyphagia and is on phentermine  15 mg daily and topiramate  100 mg daily, requesting refills for  these medications. He has been out of his medication for a week due to scheduling conflicts and holiday delays.  He reports irritability and anger issues, which have been long-standing but seem to be worsening. He describes having a 'short fuse' and incidents of increased irritability affecting his relationships. He does not attribute these changes to his current medications, as these issues predate his use.  He takes potassium and magnesium supplements daily to address hypokalemia, which is improving.      PHYSICAL EXAM:  Blood pressure 104/70, pulse 87, temperature 98.4 F (36.9 C), height 6' 4 (1.93 m), weight (!) 386 lb (175.1 kg), SpO2 99%. Body mass index is 46.99 kg/m.  DIAGNOSTIC DATA REVIEWED:  BMET    Component Value Date/Time   NA 139 03/10/2024 0000   K 3.4 (A) 03/10/2024 0000   CL 102 03/10/2024 0000   CO2 19 03/10/2024 0000   GLUCOSE 96 12/16/2022 0728   GLUCOSE 110 (H) 04/28/2022 1948   BUN 10 03/10/2024 0000   CREATININE 1.1 03/10/2024 0000   CREATININE 1.23 12/16/2022 0728   CREATININE 0.95 05/01/2016 1217   CALCIUM 9.3 03/10/2024 0000   GFRNONAA >60 04/28/2022 1948   GFRAA >60 07/06/2017 0919   Lab Results  Component Value Date   HGBA1C 5.5 03/10/2024   HGBA1C 5.5 03/08/2007   Lab Results  Component Value Date   INSULIN  64.4 (H) 12/16/2022   INSULIN  34.5 (H) 12/02/2021   Lab Results  Component Value Date  TSH 2.19 03/10/2024   CBC    Component Value Date/Time   WBC 7.9 03/10/2024 0000   WBC 10.8 (H) 04/28/2022 1948   RBC 5.80 (A) 03/10/2024 0000   HGB 15.3 03/10/2024 0000   HGB 14.5 12/16/2022 0728   HCT 49 03/10/2024 0000   HCT 45.0 12/16/2022 0728   PLT 262 03/10/2024 0000   PLT 275 12/16/2022 0728   MCV 78 (L) 12/16/2022 0728   MCH 25.0 (L) 12/16/2022 0728   MCH 24.2 (L) 04/28/2022 1948   MCHC 32.2 12/16/2022 0728   MCHC 31.6 04/28/2022 1948   RDW 16.0 (H) 12/16/2022 0728   Iron  Studies    Component Value Date/Time   IRON  37  07/28/2023 0000   TIBC 46 03/10/2024 0000   FERRITIN 91 03/10/2024 0000   IRONPCTSAT 14 (L) 12/16/2022 0728   IRONPCTSAT 9 (L) 05/08/2021 1530   Lipid Panel     Component Value Date/Time   CHOL 179 03/10/2024 0000   CHOL 167 12/16/2022 0728   TRIG 142 03/10/2024 0000   HDL 40 03/10/2024 0000   HDL 44 12/16/2022 0728   CHOLHDL 4 02/02/2018 1600   VLDL 29.4 02/02/2018 1600   LDLCALC 114 03/10/2024 0000   LDLCALC 103 (H) 12/16/2022 0728   Hepatic Function Panel     Component Value Date/Time   PROT 6.8 12/16/2022 0728   ALBUMIN 4.1 03/10/2024 0000   ALBUMIN 3.9 (L) 12/16/2022 0728   AST 25 03/10/2024 0000   ALT 29 03/10/2024 0000   ALKPHOS 73 03/10/2024 0000   BILITOT 0.9 12/16/2022 0728   BILIDIR 0.2 06/13/2010 1556      Component Value Date/Time   TSH 2.19 03/10/2024 0000   TSH 2.930 12/02/2021 1050   Nutritional Lab Results  Component Value Date   VD25OH 74.8 03/10/2024   VD25OH 38.7 07/28/2023   VD25OH 37.8 12/16/2022     Assessment and Plan Assessment & Plan Obesity He is adhering to a category four eating plan with 80% compliance, resulting in a 10-pound weight loss over five weeks. He engages in yoga twice weekly and core strengthening once weekly. The current regimen is effective, with phentermine  and topiramate  managing appetite and supporting weight loss. The current dosage is effective without increasing side effects. Alternative medications are available if needed in the future. - Continue category four eating plan - Continue yoga twice weekly and core strengthening once weekly - Refill phentermine  15 mg daily and topiramate  100 mg daily  Polyphagia Phentermine  15 mg daily and topiramate  100 mg daily effectively control his appetite. The current dosage is appropriate and effective. - Refill phentermine  15 mg daily and topiramate  100 mg daily  Prediabetes He is on metformin  500 mg daily, with diet, exercise, and weight loss efforts. His A1c is 5.5,  indicating good glycemic control. Continued adherence to the current regimen is recommended. - Continue metformin  500 mg daily - Continue diet and exercise regimen  Hypokalemia Potassium levels are improving with potassium and magnesium supplements. Dietary advice was provided to increase potassium intake through food sources. Continued monitoring and dietary adjustments are recommended. - Continue potassium and magnesium supplements - Provide dietary handout for high potassium foods  Vitamin D  Deficiency He is on prescription vitamin D  50,000 IU weekly. His vitamin D  level is 74, above the target range. As he loses weight, vitamin D  levels are increasing faster. To prevent potential toxicity, the supplementation frequency will be adjusted. - Change vitamin D  supplementation to every other week  Irritability (  possibly secondary to anxiety) He reports increased irritability, possibly secondary to anxiety, affecting relationships. This has been a long-standing issue, not significantly worsened by phentermine . Lexapro  is being initiated to manage symptoms, with a follow-up planned to assess efficacy and side effects. Referral to a bariatric psychologist is also planned for further evaluation and management. This may be contributing to comfort and stress eating behaviors. - Start Lexapro  10 mg daily - Refer to Psychologist Dr Sharron for further evaluation and management as needed     I have personally spent 48 minutes total time today in preparation, patient care, and documentation for this visit, including the following: review of clinical lab tests; review of medical history, review of nutritional history including hunger and appetite and dietary counseling  He was informed of the importance of frequent follow up visits to maximize his success with intensive lifestyle modifications for his multiple health conditions.    Louann Penton, MD

## 2024-04-28 ENCOUNTER — Encounter: Payer: Self-pay | Admitting: Advanced Practice Midwife

## 2024-05-15 ENCOUNTER — Telehealth (INDEPENDENT_AMBULATORY_CARE_PROVIDER_SITE_OTHER): Admitting: Psychology

## 2024-05-15 DIAGNOSIS — F419 Anxiety disorder, unspecified: Secondary | ICD-10-CM | POA: Diagnosis not present

## 2024-05-15 DIAGNOSIS — F909 Attention-deficit hyperactivity disorder, unspecified type: Secondary | ICD-10-CM

## 2024-05-15 DIAGNOSIS — F5089 Other specified eating disorder: Secondary | ICD-10-CM | POA: Diagnosis not present

## 2024-05-15 NOTE — Progress Notes (Signed)
 Office: [phone]  /  Fax: [phone]    Date: May 15, 2024    Appointment Start Time: 11:59am Duration: 52 minutes Provider: Wyatt [name], Psy.D. Type of Session: Intake for Individual Therapy  Location of Patient: Work (address obtained; private location) Location of Provider: Provider's home (private office) Type of Contact: Telepsychological [name] via Alexis [name]  Informed Consent: Prior to proceeding with today's appointment, two pieces of identifying information were obtained. In addition, Alexis [name]'s physical location at the time of this appointment was obtained as well a phone number he could be reached at in the event of technical difficulties. Alexis [name] and this provider participated in today's telepsychological service.   The provider's role was explained to Alexis [name]. The provider reviewed and discussed issues of confidentiality, privacy, and limits therein (e.g., reporting obligations). In addition to verbal informed consent, written informed consent for psychological services was obtained prior to the initial appointment. Since the clinic is not a 24/7 crisis center, mental [name] emergency resources were shared and this  provider explained Alexis, e-mail, voicemail, and/or other messaging systems should be utilized only for non-emergency reasons. This provider also explained that information obtained during appointments will be placed in Alexis [name] medical record and relevant information will be shared with other providers at [name] at any locations for coordination of care. Alexis [name] agreed information may be shared with other Alexis [name] providers as needed for coordination of care and by signing the service agreement document, he provided written consent for coordination of care. Prior to initiating telepsychological services, Alexis [name] completed an informed consent document, which included the development of a safety plan (i.e., an emergency  contact and emergency resources) in the event of an emergency/crisis. Alexis [name] verbally acknowledged understanding he is ultimately responsible for understanding his insurance benefits for telepsychological and in-person services. This provider also reviewed confidentiality, as it relates to telepsychological services. Alexis [name]  acknowledged understanding that appointments cannot be recorded without both party consent and he is aware he is responsible for securing confidentiality on his end of the session. Alexis [name] verbally consented to proceed.  Chief Complaint/HPI: Alexis [name] was referred by Alexis [name] on 04/25/2024 due to Irritability (possibly secondary to anxiety). Per the note for the OV, He reports increased irritability, possibly secondary to anxiety, affecting relationships. This has been a long-standing issue, not significantly worsened by Alexis [name] . Alexis [name]  is being initiated to manage symptoms, with a follow-up planned to assess efficacy and side effects. Referral to a bariatric psychologist is also planned for further evaluation and management. This may be contributing to comfort and stress eating behaviors. - Start Alexis [name]  10 mg daily - Refer to Psychologist Dr [name] for further evaluation and management as needed.  During today's appointment, Alexis [name] reported his partner noticed increased agitation and frustration, which appears to be worsening. He indicated he discussed with Alexis [name] as it relates to possible side effect of a rx. During his last appointment with Alexis [name], he discussed further with Dr. Penton resulting in a referral to this provider and a rx of Alexis [name] . He was verbally administered a questionnaire assessing various behaviors related to emotional eating behaviors. Alexis [name] endorsed the following: overeat when you are celebrating, experience food cravings on a regular basis, eat certain foods when you are anxious, stressed, depressed, or your feelings are hurt, use food to help  you cope with emotional situations, find food is comforting to you, overeat when you are worried about something, overeat frequently when you are bored or lonely, not  worry about what you eat when you are in a good mood, and eat as a reward. Alexis [name] believes the onset of emotional eating behaviors was likely in childhood, adding Alexis [name]  and Alexis [name]  has reduced instances of emotional eating behaviors. He disclosed overeating, noting it was secondary to mindless eating (e.g., sitting with a bag of chips and mindlessly finishing the bag). Alexis [name] denied a history of significantly restricting food intake,purging and engagement in other compensatory strategies for weight loss, and has never been diagnosed with an eating disorder. He also denied a history of treatment for emotional eating behaviors. Furthermore, Alexis [name] denied other problems of concern.    Mental Status Examination:  Appearance: neat Behavior: appropriate to circumstances Mood: anxious Affect: mood congruent Speech: WNL Eye Contact: appropriate Psychomotor Activity: WNL Gait: unable to assess  Thought Process: linear, logical, and goal directed and denies suicidal, homicidal, and self-harm ideation, plan and intent  Thought Content/Perception: no hallucinations, delusions, bizarre thinking or behavior endorsed or observed Orientation: AAOx4 Memory/Concentration: intact Insight/Judgment: fair  Family & Psychosocial History: Alexis [name] reported he is in a relationship and he does not have any children. 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 Additionally, Alexis [name] shared his highest level of education obtained is an associate's degree. Currently, Alexis [name]'s social support system consists of his partner and parents. Moreover, Alexis [name] stated he resides with his partner, dog, cat, and partner's grandmother, as he and his partner are the primary caregivers.   Medical History:  Past  Medical History:  Diagnosis Date   Acid reflux    ADD 09/13/2007   ALLERGIC RHINITIS 06/10/2009   Anemia    Anxiety    ASTHMA, UNSPECIFIED, UNSPECIFIED STATUS 07/19/2008   Back pain    Bacterial sinusitis    Bilateral swelling of feet    C. difficile diarrhea    Dental abscess    caused sinusitis   DEPRESSIVE DISORDER 09/13/2007   DYSMETABOLIC SYNDROME 10/25/2009   Gallbladder problem    GERD 09/13/2007   Headache    HYPERLIPIDEMIA 10/10/2009   HYPERTENSION 09/13/2007   IBS 03/20/2009   IBS (irritable bowel syndrome)    Joint pain    Morbid obesity (HCC) 07/19/2008   Ocular migraine    Palpitations    Prediabetes    Skin cancer    SOB (shortness of breath)    Vitamin D  deficiency    Past Surgical History:  Procedure Laterality Date   ANKLE SURGERY  10/12/2005   APPENDECTOMY  10/13/2003   BIOPSY  06/02/2021   Procedure: BIOPSY;  Surgeon: Alexis Lupita BRAVO, MD;  Location: [facility];  Service: Endoscopy;;  EGD and COLON   COLONOSCOPY WITH PROPOFOL  N/A 06/02/2021   Procedure: COLONOSCOPY WITH PROPOFOL ;  Surgeon: Alexis Lupita BRAVO, MD;  Location: [facility];  Service: Endoscopy;  Laterality: N/A;   ESOPHAGOGASTRODUODENOSCOPY (EGD) WITH PROPOFOL  N/A 06/02/2021   Procedure: ESOPHAGOGASTRODUODENOSCOPY (EGD) WITH PROPOFOL ;  Surgeon: Alexis Lupita BRAVO, MD;  Location: [facility];  Service: Endoscopy;  Laterality: N/A;   IR GENERIC HISTORICAL  07/23/2016   IR FLUORO GUIDED NEEDLE PLC ASPIRATION/INJECTION LOC 07/23/2016 Alexis Faes, MD [facility]   LASIK Bilateral 2018   SKIN CANCER EXCISION     teenager   TEE WITHOUT CARDIOVERSION N/A 04/15/2016   Procedure: TRANSESOPHAGEAL ECHOCARDIOGRAM (TEE);  Surgeon: Jerel Balding, MD;  Location: [facility] ENDOSCOPY;  Service: Cardiovascular;  Laterality: N/A;   Current Outpatient Medications on File Prior to [name]  Medication Sig Dispense Refill  albuterol  (VENTOLIN  HFA) 108 (90 Base) MCG/ACT inhaler Inhale 2 puffs into the lungs every  6 (six) hours as needed for wheezing or shortness of breath. 8 g 0   ALPRAZolam  (XANAX ) 1 MG tablet Take 1 tablet (1 mg total) by mouth 2 (two) times daily as needed for anxiety. 30 tablet 3   buPROPion  (WELLBUTRIN  XL) 150 MG 24 hr tablet Take 150 mg by mouth daily.     Cholecalciferol  1.25 MG (50000 UT) TABS Take 1 tablet by mouth once a week. 4 tablet 0   EPINEPHrine  0.3 mg/0.3 mL IJ SOAJ injection Inject into the muscle once.     escitalopram  (Alexis [name] ) 10 MG tablet Take 1 tablet (10 mg total) by mouth daily. 30 tablet 0   Ferrous Sulfate (IRON ) 325 (65 Fe) MG TABS Take 1 tablet (325 mg total) by mouth daily. 30 tablet 0   Galcanezumab -gnlm (EMGALITY ) 120 MG/ML SOAJ Inject 120 mg into the skin every 30 (thirty) days. 3 mL 11   irbesartan -hydrochlorothiazide  (AVALIDE) 300-12.5 MG tablet TAKE 1 TABLET BY MOUTH EVERY DAY NEEDS APPT FOR REFILLS 90 tablet 1   Magnesium 100 MG CAPS Take 100 mg by mouth.     metFORMIN  (GLUCOPHAGE ) 500 MG tablet Take 1 tablet (500 mg total) by mouth daily with breakfast. 90 tablet 0   omeprazole  (PRILOSEC) 40 MG capsule TAKE 1 CAPSULE BY MOUTH TWICE A DAY 180 capsule 1   ondansetron  (ZOFRAN -ODT) 4 MG disintegrating tablet Take 1-2 tablets (4-8 mg total) by mouth every 8 (eight) hours as needed. 30 tablet 3   Alexis [name]  15 MG capsule Take 1 capsule (15 mg total) by mouth daily. 30 capsule 0   potassium chloride  (KLOR-CON ) 8 MEQ tablet Take 8 mEq by mouth daily.     Rimegepant Sulfate (NURTEC) 75 MG TBDP Take 1 tablet (75 mg total) by mouth daily as needed. For migraines. Take as close to onset of migraine as possible. One daily maximum. PLEASE RUN COPAY CARD BIN [id number] PCN CN group [id number] ID [id number] 16 tablet 11   rizatriptan  (MAXALT -MLT) 10 MG disintegrating tablet Take 1 tablet (10 mg total) by mouth as needed for migraine. May repeat in 2 hours if needed 36 tablet 11   topiramate  (Alexis [name] ) 100 MG tablet Take 1 tablet (100 mg total) by mouth daily. 30 tablet 0    ursodiol (ACTIGALL) 300 MG capsule Take 300 mg by mouth 2 (two) times daily.     No current facility-administered medications on file prior to [name].  Yaviel stated he is medication compliant.   Mental [name] History: Kekoa stated he was diagnosed with ADD by his pediatrician and completed an evaluation with a mental-[name] provider, noting he was prescribed medications (Ritalin, Adderall, Concerta, Strattera) which he ceased use after finishing school in 2007. Currently, he is prescribed Xanax  PRN, Wellbutrin , and Alexis [name] . Beauregard reported there is no history of hospitalizations for psychiatric concerns. He endorsed a family history of ADHD (mother), anxiety (parents), depression (parents), and addictive personality (multiple family members on both sides of the family). Furthermore, Aksh reported there is no history of trauma including psychological, physical , and sexual abuse, as well as neglect.   Eva described a hx of feeling cranky, irritable, and agitated starting in childhood, noting worsening of the abovementioned due a stressful work situation approximately three years ago resulting him in taking three months off and switching jobs. Ever since the aforementioned occurred, he feels things have not been the same. Since starting Alexis [name] , he noted a belief  of mood improvement. He endorsed experiencing the following ADHD-related sxs: procrastination, utilizing sense of urgency as a motivator, hyperfocus on enjoyable activities, impulsivity, forgetfulness, and fidgeting waxes and wanes. Of note, he disclosed his partner is also diagnosed with ADHD. He also shared experiencing panic-related symptoms during his previous employment and described symptoms as situational. Furthermore, Emilliano disclosed he is self-critical. Trevion denied current alcohol use. He denied tobacco use. He denied illicit/recreational substance use. Furthermore, [name] indicated he is not experiencing the  following: hallucinations and delusions, paranoia, symptoms of mania , social withdrawal, crying spells, and obsessions and compulsions. He also denied history of and current suicidal ideation, plan, and intent; history of and current homicidal ideation, plan, and intent; and history of and current engagement in self-harm.  Legal History: Usman reported there is no history of legal involvement.   Structured Assessments Results: The Patient [name] Questionnaire-9 (PHQ-9) is a self-report measure that assesses symptoms and severity of depression over the course of the last two weeks. Lane obtained a score of 10 suggesting moderate depression. Kristopher finds the endorsed symptoms to be somewhat difficult. [0= Not at all; 1= Several days; 2= More than half the days; 3= Nearly every day] Little interest or pleasure in doing things 1  Feeling down, depressed, or hopeless 0  Trouble falling or staying asleep, or sleeping too much 3  Feeling tired or having little energy 3  Poor appetite or overeating 0  Feeling bad about yourself --- or that you are a failure or have let yourself or your family down 1  Trouble concentrating on things, such as reading the newspaper or watching television 2  Moving or speaking so slowly that other people could have noticed? Or the opposite --- being so fidgety or restless that you have been moving around a lot more than usual 0  Thoughts that you would be better off dead or hurting yourself in some way 0  PHQ-9 Score 10    The Generalized Anxiety Disorder-7 (GAD-7) is a brief self-report measure that assesses symptoms of anxiety over the course of the last two weeks. Alen obtained a score of 8 suggesting mild anxiety. Jayme finds the endorsed symptoms to be very difficult. [0= Not at all; 1= Several days; 2= Over half the days; 3= Nearly every day] Feeling nervous, anxious, on edge 1  Not being able to stop or control worrying 1  Worrying too much about different  things 1  Trouble relaxing 1  Being so restless that it's hard to sit still 0  Becoming easily annoyed or irritable 3  Feeling afraid as if something awful might happen- depends on the day or the mood; what if scenarios  1  GAD-7 Score 8   Interventions:  Conducted a chart review Focused on rapport building Verbally administered PHQ-9 and GAD-7 for symptom monitoring Verbally administered Food & Mood questionnaire to assess various behaviors related to emotional eating Provided emphatic reflections and validation Psychoeducation provided regarding physical versus emotional hunger  Recommended/discussed option(s) for longer-term therapeutic services Recommended/discussed option(s) for psychiatric services Brief psychoeducation provided regarding ADHD and disordered eating bxs, including resources  Diagnostic Impressions & Provisional DSM-5 Diagnosis(es): Miloh endorsed a history of engagement in emotional eating behaviors starting in childhood, noting a reduction with Alexis [name]  and Alexis [name] . Doron denied engagement in any other disordered eating behaviors. Based on the aforementioned, the following diagnosis was assigned: F50.89 Other Specified Feeding or Eating Disorder, Emotional Eating Behaviors. Moreover, he shared he was diagnosed with ADHD in childhood  and noted a hx of medications that he has ceased since finishing school in 2007. He shared recent exacerbation of irritable mood and endorsed experiencing various ADHD-related symptoms. 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 Given the limited scope of this appointment and this provider's role with the clinic, the following diagnoses were assigned: F90.9 Unspecified Attention-Deficit/Hyperactivity Disorder  and F41.9 Unspecified Anxiety Disorder.  Plan:  Roston appears able and willing to participate as evidenced by engagement in reciprocal conversation and asking questions as needed for clarification. The next appointment is scheduled for 05/30/2024 at 11am, which will be via Alexis [name]. The following treatment goal was established: increase coping skills. This provider will regularly review the treatment plan and medical chart to keep informed of status changes. Karthikeya expressed understanding and agreement with the initial treatment plan of care.   Savino will be sent a handout via e-mail to utilize between now and the next appointment to increase awareness of hunger patterns and subsequent eating. Breckan provided verbal consent during today's appointment for this provider to send the handout via e-mail. Jac provided verbal consent for this provider to e-mail referral options for psychiatric and therapeutic services.    Alexis Fire, PsyD

## 2024-05-17 ENCOUNTER — Other Ambulatory Visit (INDEPENDENT_AMBULATORY_CARE_PROVIDER_SITE_OTHER): Payer: Self-pay | Admitting: Family Medicine

## 2024-05-17 DIAGNOSIS — R454 Irritability and anger: Secondary | ICD-10-CM

## 2024-05-17 DIAGNOSIS — R632 Polyphagia: Secondary | ICD-10-CM

## 2024-05-22 ENCOUNTER — Ambulatory Visit (INDEPENDENT_AMBULATORY_CARE_PROVIDER_SITE_OTHER): Admitting: Family Medicine

## 2024-05-29 ENCOUNTER — Ambulatory Visit (INDEPENDENT_AMBULATORY_CARE_PROVIDER_SITE_OTHER): Admitting: Family Medicine

## 2024-05-29 ENCOUNTER — Encounter (INDEPENDENT_AMBULATORY_CARE_PROVIDER_SITE_OTHER): Payer: Self-pay | Admitting: Family Medicine

## 2024-05-29 VITALS — BP 115/74 | HR 101 | Temp 98.2°F | Ht 76.0 in | Wt 376.0 lb

## 2024-05-29 DIAGNOSIS — Z6841 Body Mass Index (BMI) 40.0 and over, adult: Secondary | ICD-10-CM

## 2024-05-29 DIAGNOSIS — E669 Obesity, unspecified: Secondary | ICD-10-CM

## 2024-05-29 DIAGNOSIS — R454 Irritability and anger: Secondary | ICD-10-CM

## 2024-05-29 DIAGNOSIS — R632 Polyphagia: Secondary | ICD-10-CM | POA: Diagnosis not present

## 2024-05-29 MED ORDER — PHENTERMINE HCL 15 MG PO CAPS: 15.0000 mg | ORAL_CAPSULE | Freq: Every day | ORAL | 0 refills | Status: DC

## 2024-05-29 MED ORDER — TOPIRAMATE 100 MG PO TABS: 100.0000 mg | ORAL_TABLET | Freq: Every day | ORAL | 0 refills | Status: DC

## 2024-05-29 MED ORDER — TOPIRAMATE 100 MG PO TABS: 100.0000 mg | ORAL_TABLET | Freq: Every day | ORAL | 1 refills | Status: DC

## 2024-05-29 MED ORDER — ESCITALOPRAM OXALATE 10 MG PO TABS: 10.0000 mg | ORAL_TABLET | Freq: Every day | ORAL | 0 refills | Status: DC

## 2024-05-29 NOTE — Progress Notes (Unsigned)
   SUBJECTIVE:  Chief Complaint: Obesity  Interim History: Patient had a good birthday and mentions he hung out by the pool.  His significant other just started nursing school today and he voices that will take up a majority of his time.  He is eating 85% of the food in and still trying to find new ways to get protein in.  He tried a protein bagel and he really enjoyed them.  He feels like he needs to be conscious about drinking more water.  Christino is here to discuss his progress with his obesity treatment plan. He is on the Category 4 Plan and states he is following his eating plan approximately 85 % of the time. He states he is exercising 45-60 minutes 3 times per week.   OBJECTIVE: Visit Diagnoses: Problem List Items Addressed This Visit   None   Vitals Temp: 98.2 F (36.8 C) BP: 115/74 Pulse Rate: (!) 101 SpO2: 97 %   Anthropometric Measurements Height: 6' 4 (1.93 m) Weight: (!) 376 lb (170.6 kg) BMI (Calculated): 45.79 Weight at Last Visit: 386 lb Weight Lost Since Last Visit: 10 lb Weight Gained Since Last Visit: 0 Starting Weight: 477 lb Total Weight Loss (lbs): 101 lb (45.8 kg)   Body Composition  Body Fat %: 41.5 % Fat Mass (lbs): 156.4 lbs Muscle Mass (lbs): 209.8 lbs Total Body Water (lbs): 164.8 lbs Visceral Fat Rating : 25   Other Clinical Data Today's Visit #: 31 Starting Date: 12/02/21 Comments: Cat 4     ASSESSMENT AND PLAN:  Diet: Emerick is currently in the action stage of change. As such, his goal is to continue with weight loss efforts and has agreed to the Category 4 Plan.   Exercise:  For substantial health benefits, adults should do at least 150 minutes (2 hours and 30 minutes) a week of moderate-intensity, or 75 minutes (1 hour and 15 minutes) a week of vigorous-intensity aerobic physical activity, or an equivalent combination of moderate- and vigorous-intensity aerobic activity. Aerobic activity should be performed in episodes of at  least 10 minutes, and preferably, it should be spread throughout the week.  Behavior Modification:  We discussed the following Behavioral Modification Strategies today: increasing lean protein intake, decreasing simple carbohydrates, increasing vegetables, meal planning and cooking strategies, keeping healthy foods in the home, avoiding temptations, and planning for success. We discussed various medication options to help Paeton with his weight loss efforts and we both agreed to continue current doses of phentermine  and topiramate .   No follow-ups on file.   He was informed of the importance of frequent follow up visits to maximize his success with intensive lifestyle modifications for his multiple health conditions.  Attestation Statements:   Reviewed by clinician on day of visit: allergies, medications, problem list, medical history, surgical history, family history, social history, and previous encounter notes.   Time spent on visit including pre-visit chart review and post-visit care and charting was *** minutes  Adelita Cho, MD

## 2024-05-30 ENCOUNTER — Telehealth (INDEPENDENT_AMBULATORY_CARE_PROVIDER_SITE_OTHER): Admitting: Psychology

## 2024-05-30 DIAGNOSIS — F5089 Other specified eating disorder: Secondary | ICD-10-CM | POA: Diagnosis not present

## 2024-05-30 DIAGNOSIS — F909 Attention-deficit hyperactivity disorder, unspecified type: Secondary | ICD-10-CM | POA: Diagnosis not present

## 2024-05-30 DIAGNOSIS — F419 Anxiety disorder, unspecified: Secondary | ICD-10-CM

## 2024-05-30 NOTE — Assessment & Plan Note (Signed)
 Patient doing well on phentermine  and topiramate  daily.  PDMP checked with no concerns.  Starting weight 469 on medication and now at 376.  Refill phentermine  15mg  and topiramate  100mg  today.  Patient has done exceptionally well on combination medication therapy so we will refill for 2 months today.  Goal will be to space out appointments every 3 months.

## 2024-05-30 NOTE — Progress Notes (Signed)
  Office: [phone]  /  Fax: [phone]    Date: May 30, 2024  Appointment Start Time: 11:02am Duration: 34 minutes Provider: Wyatt Fire, Psy.D. Type of Session: Individual Therapy  Location of Patient: Work (address obtained; safe/private location) Location of Provider: Provider's Home (private office) Type of Contact: Telepsychological Visit via MyChart Video Visit  Session Content: Alexis [name] is a 38 y.o. male presenting for a follow-up appointment to address the previously established treatment goal of increasing coping skills. Today's appointment was a telepsychological visit. Alexis [name] provided verbal consent for today's telepsychological appointment and he is aware he is responsible for securing confidentiality on his end of the session. Prior to proceeding with today's appointment, Alexis [name]'s physical location at the time of this appointment was obtained as well a phone number he could be reached at in the event of technical difficulties. [name] and this provider participated in today's telepsychological service.   This provider conducted a brief check-in. Alexis [name] stated things have been more manageable since starting Lexapro . He also reported he continues to lose weight and has lost 100lbs with the clinic. Reflected on non-scale victories to date (e.g., increased energy, improvement in how clothes fit, increased ability to do different activities with partner, ceased use of BP medication, improvement in lab results). Psychoeducation provided regarding healthy weight loss. Additionally, Alexis [name] reported exploring shared ADHD-related resources. Associated thoughts and feelings were further explored and processed. Psychoeducation regarding triggers for emotional eating was provided. Alexis [name] was provided a handout, and encouraged to utilize the handout between now and the next appointment to increase awareness of triggers and frequency. Alexis [name] agreed. This provider also discussed behavioral  strategies for specific triggers, such as placing the utensil down when conversing to avoid mindless eating. Alexis [name] provided verbal consent during today's appointment for this provider to send a handout about triggers via e-mail. Overall, Alexis [name] was receptive to today's appointment as evidenced by openness to sharing, responsiveness to feedback, and willingness to explore triggers for emotional eating.  Mental Status Examination:  Appearance: neat Behavior: appropriate to circumstances Mood: neutral Affect: mood congruent Speech: WNL Eye Contact: appropriate Psychomotor Activity: WNL Gait: unable to assess Thought Process: linear, logical, and goal directed and no evidence or endorsement of suicidal, homicidal, and self-harm ideation, plan and intent  Thought Content/Perception: no hallucinations, delusions, bizarre thinking or behavior endorsed or observed Orientation: AAOx4 Memory/Concentration: intact Insight: good Judgment: good  Interventions:  Conducted a brief chart review Provided empathic reflections and validation Reviewed content from the previous session Provided positive reinforcement Employed supportive psychotherapy interventions to facilitate reduced distress and to improve coping skills with identified stressors Recommended/discussed options for longer-term therapeutic services and psychiatric services  DSM-5 Diagnosis(es): F50.89 Other Specified Feeding or Eating Disorder, Emotional Eating Behaviors, F90.9 Unspecified Attention-Deficit/Hyperactivity Disorder  and F41.9 Unspecified Anxiety Disorder  Treatment Goal & Progress: During the initial appointment with this provider, the following treatment goal was established: increase coping skills. Progress is limited, as Alexis [name] has just begun treatment with this provider; however, he is receptive to the interaction and interventions and rapport is being established.   Plan: The next appointment is scheduled for 06/19/2024 at  2:30pm, which will be via MyChart Video Visit. The next session will focus on working towards the established treatment goal. Per [facility] request, additional referral options for therapeutic and psychiatric providers in the [place] area will be sent via e-mail.    Wyatt Fire, PsyD

## 2024-06-02 ENCOUNTER — Other Ambulatory Visit (INDEPENDENT_AMBULATORY_CARE_PROVIDER_SITE_OTHER): Payer: Self-pay | Admitting: Family Medicine

## 2024-06-02 DIAGNOSIS — E559 Vitamin D deficiency, unspecified: Secondary | ICD-10-CM

## 2024-06-19 ENCOUNTER — Telehealth (INDEPENDENT_AMBULATORY_CARE_PROVIDER_SITE_OTHER): Admitting: Psychology

## 2024-06-19 DIAGNOSIS — F5089 Other specified eating disorder: Secondary | ICD-10-CM

## 2024-06-19 DIAGNOSIS — F909 Attention-deficit hyperactivity disorder, unspecified type: Secondary | ICD-10-CM

## 2024-06-19 DIAGNOSIS — F419 Anxiety disorder, unspecified: Secondary | ICD-10-CM | POA: Diagnosis not present

## 2024-06-19 NOTE — Progress Notes (Signed)
  Office: [phone]  /  Fax: [phone]    Date: June 19, 2024  Appointment Start Time: 2:31pm Duration: 24 minutes Provider: Wyatt Fire, Psy.D. Type of Session: Individual Therapy  Location of Patient: Work (address obtained; private location) Location of Provider: Provider's Home (private office) Type of Contact: Telepsychological Visit via MyChart Video Visit  Session Content: Alexis [name] is a 38 y.o. male presenting for a follow-up appointment to address the previously established treatment goal of increasing coping skills. Today's appointment was a telepsychological visit. Alexis [name] provided verbal consent for today's telepsychological appointment and he is aware he is responsible for securing confidentiality on his end of the session. Prior to proceeding with today's appointment, Alexis [name]'s physical location at the time of this appointment was obtained as well a phone number he could be reached at in the event of technical difficulties. Alexis [name] and this provider participated in today's telepsychological service.   This provider conducted a brief check-in. Alexis [name] shared about recent events, included recent stressors. Reviewed triggers for emotional eating behaviors. Due to current stressors and busy schedule, Alexis [name] was engaged in problem solving to help increase adherence to prescribed structured meal plan. He agreed to implement the following: leave frozen meals for the work week at the beginning of the work week; double recipes when possible; and leave snacks/fruits (e.g., cheese sticks, roasted edamame, tuna packets) at work in bulk. Overall, Alexis [name] was receptive to today's appointment as evidenced by openness to sharing, responsiveness to feedback, and willingness to implement discussed strategies .  Mental Status Examination:  Appearance: neat Behavior: appropriate to circumstances Mood: neutral Affect: mood congruent Speech: WNL Eye Contact: appropriate Psychomotor Activity:  WNL Gait: unable to assess Thought Process: linear, logical, and goal directed and no evidence or endorsement of suicidal, homicidal, and self-harm ideation, plan and intent  Thought Content/Perception: no hallucinations, delusions, bizarre thinking or behavior endorsed or observed Orientation: AAOx4 Memory/Concentration: intact Insight: fair Judgment: fair  Interventions:  Conducted a brief chart review Provided empathic reflections and validation Reviewed content from the previous session Provided positive reinforcement Employed supportive psychotherapy interventions to facilitate reduced distress and to improve coping skills with identified stressors Engaged patient in problem solving  DSM-5 Diagnosis(es): F50.89 Other Specified Feeding or Eating Disorder, Emotional Eating Behaviors, F90.9 Unspecified Attention-Deficit/Hyperactivity Disorder  and F41.9 Unspecified Anxiety Disorder  Treatment Goal & Progress: During the initial appointment with this provider, the following treatment goal was established: increase coping skills. Alexis [name] has demonstrated progress in his goal as evidenced by increased awareness of hunger patterns and increased awareness of triggers for emotional eating behaviors. Alexis [name] also continues to demonstrate willingness to engage in learned skill(s).  Plan: The next appointment is scheduled for 07/03/2024 at 3:30pm, which will be via MyChart Video Visit. The next session will focus on working towards the established treatment goal. Alexis [name] reported a plan to establish care for therapeutic and psychiatric providers from referrals provided.     Wyatt Fire, PsyD

## 2024-07-03 ENCOUNTER — Other Ambulatory Visit (INDEPENDENT_AMBULATORY_CARE_PROVIDER_SITE_OTHER): Payer: Self-pay | Admitting: Family Medicine

## 2024-07-03 ENCOUNTER — Telehealth (INDEPENDENT_AMBULATORY_CARE_PROVIDER_SITE_OTHER): Admitting: Psychology

## 2024-07-03 ENCOUNTER — Encounter (INDEPENDENT_AMBULATORY_CARE_PROVIDER_SITE_OTHER): Payer: Self-pay | Admitting: Family Medicine

## 2024-07-03 DIAGNOSIS — F909 Attention-deficit hyperactivity disorder, unspecified type: Secondary | ICD-10-CM

## 2024-07-03 DIAGNOSIS — F419 Anxiety disorder, unspecified: Secondary | ICD-10-CM | POA: Diagnosis not present

## 2024-07-03 DIAGNOSIS — F5089 Other specified eating disorder: Secondary | ICD-10-CM | POA: Diagnosis not present

## 2024-07-03 DIAGNOSIS — E559 Vitamin D deficiency, unspecified: Secondary | ICD-10-CM

## 2024-07-03 NOTE — Progress Notes (Signed)
  Office: [phone]  /  Fax: [phone]    Date: July 03, 2024  Appointment Start Time: 3:31pm Duration: 34 minutes Provider: Wyatt Fire, Psy.D. Type of Session: Individual Therapy  Location of Patient: Work (private location) Location of Provider: Provider's Home (private office) Type of Contact: Telepsychological Visit via MyChart Video Visit  Session Content: Alexis [name] is a 38 y.o. male presenting for a follow-up appointment to address the previously established treatment goal of increasing coping skills. Today's appointment was a telepsychological visit. Alexis [name] provided verbal consent for today's telepsychological appointment and he is aware he is responsible for securing confidentiality on his end of the session. Prior to proceeding with today's appointment, Alexis [name]'s physical location at the time of this appointment was obtained as well a phone number he could be reached at in the event of technical difficulties. Alexis [name] and this provider participated in today's telepsychological service.   This provider conducted a brief check-in. Alexis [name] shared things are going well. He reflected further on non-scale victories, including an increase in confidence when shopping for new clothes. While he described observing various non-scale victories, he noted experiencing waxing and waning self-confidence resulting in being hard on himself which his partner has also observed. The aforementioned can trigger emotional eating behaviors. Psychoeducation provided regarding self-compassion. Alexis [name] was engaged in a self-compassion exercise to help with eating-related challenges and other ongoing stressors. He was encouraged to regularly ask himself, "What do I need right now?" and How can I comfort and care for myself in this moment? Overall, Alexis [name] was receptive to today's appointment as evidenced by openness to sharing, responsiveness to feedback, and willingness to work toward increasing  self-compassion.  Mental Status Examination:  Appearance: neat Behavior: appropriate to circumstances Mood: neutral Affect: mood congruent Speech: WNL Eye Contact: appropriate Psychomotor Activity: WNL Gait: unable to assess Thought Process: linear, logical, and goal directed and no evidence or endorsement of suicidal, homicidal, and self-harm ideation, plan and intent  Thought Content/Perception: no hallucinations, delusions, bizarre thinking or behavior endorsed or observed Orientation: time, person, place, and purpose of appointment Memory/Concentration: intact Insight: good Judgment: good  Interventions:  Conducted a brief chart review Provided empathic reflections and validation Reviewed content from the previous session Provided positive reinforcement Employed supportive psychotherapy interventions to facilitate reduced distress and to improve coping skills with identified stressors Psychoeducation provided regarding self-compassion Engaged pt in a self-compassion exercise  DSM-5 Diagnosis(es): F50.89 Other Specified Feeding or Eating Disorder, Emotional Eating Behaviors, F90.9 Unspecified Attention-Deficit/Hyperactivity Disorder  and F41.9 Unspecified Anxiety Disorder  Treatment Goal & Progress: During the initial appointment with this provider, the following treatment goal was established: increase coping skills. Shaw has demonstrated progress in his goal as evidenced by increased awareness of hunger patterns and increased awareness of triggers for emotional eating behaviors. Deverick also continues to demonstrate willingness to engage in learned skill(s).  Plan: The next appointment is scheduled for 07/17/2024 at 3:30pm, which will be via MyChart Video Visit. The next session will focus on working towards the established treatment goal. Alexis [name] disclosed challenges with initiating therapeutic and psychiatric services; therefore, this provider and Alexis [name] will further discuss and  process during the next appointment.    Wyatt Fire, PsyD

## 2024-07-06 DIAGNOSIS — I10 Essential (primary) hypertension: Secondary | ICD-10-CM | POA: Diagnosis not present

## 2024-07-06 DIAGNOSIS — I493 Ventricular premature depolarization: Secondary | ICD-10-CM | POA: Diagnosis not present

## 2024-07-06 DIAGNOSIS — E876 Hypokalemia: Secondary | ICD-10-CM | POA: Diagnosis not present

## 2024-07-17 ENCOUNTER — Telehealth (INDEPENDENT_AMBULATORY_CARE_PROVIDER_SITE_OTHER): Admitting: Psychology

## 2024-07-17 DIAGNOSIS — F909 Attention-deficit hyperactivity disorder, unspecified type: Secondary | ICD-10-CM | POA: Diagnosis not present

## 2024-07-17 DIAGNOSIS — F419 Anxiety disorder, unspecified: Secondary | ICD-10-CM | POA: Diagnosis not present

## 2024-07-17 DIAGNOSIS — F5089 Other specified eating disorder: Secondary | ICD-10-CM

## 2024-07-17 NOTE — Progress Notes (Signed)
  Office: [phone]  /  Fax: [phone]    Date: July 17, 2024  Appointment Start Time: 3:12pm Duration: 25 minutes Provider: Wyatt Fire, Psy.D. Type of Session: Individual Therapy  Location of Patient: Work (private location) Location of Provider: [facility] at [employer] Type of Contact: Telepsychological Visit via MyChart Video Visit  Session Content: Alexis [name] is a 38 y.o. male presenting for a follow-up appointment to address the previously established treatment goal of increasing coping skills. Today's appointment was a telepsychological visit. Alexis [name] provided verbal consent for today's telepsychological appointment and he is aware he is responsible for securing confidentiality on his end of the session. Prior to proceeding with today's appointment, Alexis [name]'s physical location at the time of this appointment was obtained as well a phone number he could be reached at in the event of technical difficulties. Alexis [name] and this provider participated in today's telepsychological service.   This provider conducted a brief check-in. [name] shared about recent health concerns. Reviewed self-compassion. Further discussed traditional therapeutic services. This provider and Alexis [name] explored the story his mind tells him about his weight loss/eating habits that is stuck on repeat. For homework, he was encouraged to re-write a more compassionate version. Overall, Alexis [name] was receptive to today's appointment as evidenced by openness to sharing, responsiveness to feedback, and willingness to work toward increasing self-compassion.  Mental Status Examination:  Appearance: neat Behavior: appropriate to circumstances Mood: sad Affect: mood congruent Speech: WNL Eye Contact: appropriate Psychomotor Activity: WNL Gait: unable to assess Thought Process: linear, logical, and goal directed and no evidence or endorsement of suicidal, homicidal, and self-harm ideation, plan and intent  Thought  Content/Perception: no hallucinations, delusions, bizarre thinking or behavior endorsed or observed Orientation: time, person, place, and purpose of appointment Memory/Concentration: intact Insight: good Judgment: good  Interventions:  Conducted a brief chart review Provided empathic reflections and validation Reviewed content from the previous session Provided positive reinforcement Employed supportive psychotherapy interventions to facilitate reduced distress and to improve coping skills with identified stressors Engaged pt in a self-compassion exercise  DSM-5 Diagnosis(es): F50.89 Other Specified Feeding or Eating Disorder, Emotional Eating Behaviors, F90.9 Unspecified Attention-Deficit/Hyperactivity Disorder  and F41.9 Unspecified Anxiety Disorder  Treatment Goal & Progress: During the initial appointment with this provider, the following treatment goal was established: increase coping skills. [name] has demonstrated progress in his goal as evidenced by increased awareness of hunger patterns and increased awareness of triggers for emotional eating behaviors. [name] also continues to demonstrate willingness to engage in learned skill(s).  Plan: The next appointment is scheduled for 08/07/2024 at 4pm, which will be via MyChart Video Visit. The next session will focus on working towards the established treatment goal.   Wyatt Fire, PsyD

## 2024-07-25 LAB — IRON,TIBC AND FERRITIN PANEL
Ferritin: 88
Iron: 34
TIBC: 282
UIBC: 248

## 2024-07-25 LAB — VITAMIN B12: Vitamin B-12: 710

## 2024-07-25 LAB — COMPREHENSIVE METABOLIC PANEL WITH GFR
Albumin: 3.8 (ref 3.5–5.0)
Calcium: 9.4 (ref 8.7–10.7)
EGFR: 63
Globulin: 3.1

## 2024-07-25 LAB — CBC AND DIFFERENTIAL
HCT: 46 (ref 41–53)
Hemoglobin: 14.9 (ref 13.5–17.5)
Neutrophils Absolute: 4.6
Platelets: 273 K/uL (ref 150–400)
WBC: 7.8

## 2024-07-25 LAB — HEPATIC FUNCTION PANEL
ALT: 17 U/L (ref 10–40)
AST: 19 (ref 14–40)
Alkaline Phosphatase: 75 (ref 25–125)
Bilirubin, Total: 0.6

## 2024-07-25 LAB — BASIC METABOLIC PANEL WITH GFR
BUN: 12 (ref 4–21)
CO2: 20 (ref 13–22)
Chloride: 105 (ref 99–108)
Creatinine: 1.5 — AB (ref 0.6–1.3)
Glucose: 112
Potassium: 3.5 meq/L (ref 3.5–5.1)
Sodium: 139 (ref 137–147)

## 2024-07-25 LAB — CBC: RBC: 5.59 — AB (ref 3.87–5.11)

## 2024-07-27 ENCOUNTER — Encounter (INDEPENDENT_AMBULATORY_CARE_PROVIDER_SITE_OTHER): Payer: Self-pay | Admitting: Family Medicine

## 2024-07-27 ENCOUNTER — Ambulatory Visit (INDEPENDENT_AMBULATORY_CARE_PROVIDER_SITE_OTHER): Admitting: Family Medicine

## 2024-07-27 VITALS — BP 112/78 | HR 78 | Temp 98.5°F | Ht 76.0 in | Wt 376.0 lb

## 2024-07-27 DIAGNOSIS — E7841 Elevated Lipoprotein(a): Secondary | ICD-10-CM

## 2024-07-27 DIAGNOSIS — E669 Obesity, unspecified: Secondary | ICD-10-CM

## 2024-07-27 DIAGNOSIS — Z6841 Body Mass Index (BMI) 40.0 and over, adult: Secondary | ICD-10-CM

## 2024-07-27 DIAGNOSIS — R7303 Prediabetes: Secondary | ICD-10-CM

## 2024-07-27 DIAGNOSIS — E66813 Obesity, class 3: Secondary | ICD-10-CM

## 2024-07-27 DIAGNOSIS — R632 Polyphagia: Secondary | ICD-10-CM | POA: Diagnosis not present

## 2024-07-27 MED ORDER — VERAPAMIL HCL ER 120 MG PO TBCR: 120.0000 mg | EXTENDED_RELEASE_TABLET | Freq: Every day | ORAL | Status: AC

## 2024-07-27 MED ORDER — TOPIRAMATE 100 MG PO TABS: 100.0000 mg | ORAL_TABLET | Freq: Every day | ORAL | 1 refills | Status: DC

## 2024-07-27 MED ORDER — PHENTERMINE HCL 15 MG PO CAPS: 15.0000 mg | ORAL_CAPSULE | Freq: Every day | ORAL | 0 refills | Status: DC

## 2024-07-27 MED ORDER — METFORMIN HCL 500 MG PO TABS: 500.0000 mg | ORAL_TABLET | Freq: Every day | ORAL | 0 refills | Status: AC

## 2024-07-27 NOTE — Progress Notes (Signed)
 SUBJECTIVE:  Chief Complaint: Obesity  Interim History: Patient had an episode of palpitations at the end of last month and he sought evaluation at [facility].  His potassium was low and he was started on prescription strength potassium and his blood pressure medications were changed.  Repeat potassium level was found to be improved and at the low end of normal.   Alexis [name] is here to discuss his progress with his obesity treatment plan. He is on the Category 4 Plan and states he is following his eating plan approximately 80-85 % of the time. He states he is exercising 120 minutes 2 times per week.   OBJECTIVE: Visit Diagnoses: Problem List Items Addressed This Visit   None   Vitals Temp: 98.5 F (36.9 C) BP: 112/78 Pulse Rate: 78 SpO2: 97 %   Anthropometric Measurements Height: 6' 4 (1.93 m) Weight: (!) 376 lb (170.6 kg) BMI (Calculated): 45.79 Weight at Last Visit: 376 lb Weight Lost Since Last Visit: 0 Weight Gained Since Last Visit: 0 Starting Weight: 477 lb Total Weight Loss (lbs): 101 lb (45.8 kg)   Body Composition  Body Fat %: 41.7 % Fat Mass (lbs): 156.8 lbs Muscle Mass (lbs): 208.8 lbs Total Body Water (lbs): 167.6 lbs Visceral Fat Rating : 25   Other Clinical Data Today's Visit #: 32 Starting Date: 12/02/21 Comments: Cat 4     ASSESSMENT AND PLAN: Assessment & Plan Polyphagia Doing well on prescription strength phentermine  and topiramate .  Starting weight of 469 pounds on medication and currently at 376 pounds.  Will continue current prescription strength phentermine  15 mg and topiramate  100 mg.  PDMP checked with no concerns.  Refill for 2 months. Elevated lipoprotein(a) Significant coronary artery disease history in patient's family.  As well as elevated lipoprotein a level.  Patient is wondering if he would benefit from evaluation by cardiology.  Will refer to cardiology to assess patient's risk and possible interventions if necessary given his family  history. Prediabetes Doing well on metformin  with no GI side effects.  Refill metformin  at current dose.  Follow-up at next appointment as to how well patient has tolerated metformin . Obesity, starting BMI 58.06  BMI 45.0-49.9, adult (HCC)    Diet: Alexis [name] is currently in the action stage of change. As such, his goal is to continue with weight loss efforts and has agreed to the Category 4 Plan.   Exercise:  For additional and more extensive health benefits, adults should increase their aerobic physical activity to 300 minutes (5 hours) a week of moderate-intensity, or 150 minutes a week of vigorous-intensity aerobic physical activity, or an equivalent combination of moderate- and vigorous-intensity activity. Additional health benefits are gained by engaging in physical activity beyond this amount.  and Adults should also include muscle-strengthening activities that involve all major muscle groups on 2 or more days a week.  Behavior Modification:  We discussed the following Behavioral Modification Strategies today: increasing lean protein intake, decreasing simple carbohydrates, increasing vegetables, meal planning and cooking strategies, and planning for success. We discussed various medication options to help Alexis [name] with his weight loss efforts and we both agreed to continue current prescription strength phentermine  and topiramate ..  No follow-ups on file.   He was informed of the importance of frequent follow up visits to maximize his success with intensive lifestyle modifications for his multiple health conditions.  Attestation Statements:   Reviewed by clinician on day of visit: allergies, medications, problem list, medical history, surgical history, family history, social history, and previous  encounter notes.   Adelita Cho, MD

## 2024-08-03 DIAGNOSIS — Z6841 Body Mass Index (BMI) 40.0 and over, adult: Secondary | ICD-10-CM | POA: Diagnosis not present

## 2024-08-03 DIAGNOSIS — I10 Essential (primary) hypertension: Secondary | ICD-10-CM | POA: Diagnosis not present

## 2024-08-03 DIAGNOSIS — I493 Ventricular premature depolarization: Secondary | ICD-10-CM | POA: Diagnosis not present

## 2024-08-03 DIAGNOSIS — E876 Hypokalemia: Secondary | ICD-10-CM | POA: Diagnosis not present

## 2024-08-06 NOTE — Assessment & Plan Note (Signed)
 Doing well on prescription strength phentermine  and topiramate .  Starting weight of 469 pounds on medication and currently at 376 pounds.  Will continue current prescription strength phentermine  15 mg and topiramate  100 mg.  PDMP checked with no concerns.  Refill for 2 months.

## 2024-08-06 NOTE — Assessment & Plan Note (Signed)
 Doing well on metformin  with no GI side effects.  Refill metformin  at current dose.  Follow-up at next appointment as to how well patient has tolerated metformin .

## 2024-08-07 ENCOUNTER — Telehealth (INDEPENDENT_AMBULATORY_CARE_PROVIDER_SITE_OTHER): Admitting: Psychology

## 2024-08-07 DIAGNOSIS — F5089 Other specified eating disorder: Secondary | ICD-10-CM | POA: Diagnosis not present

## 2024-08-07 DIAGNOSIS — F909 Attention-deficit hyperactivity disorder, unspecified type: Secondary | ICD-10-CM | POA: Diagnosis not present

## 2024-08-07 DIAGNOSIS — F419 Anxiety disorder, unspecified: Secondary | ICD-10-CM

## 2024-08-07 NOTE — Progress Notes (Signed)
  Office: [phone]  /  Fax: [phone]    Date: August 07, 2024  Appointment Start Time: 4:01pm Duration: 38 minutes Provider: Wyatt Fire, Psy.D. Type of Session: Individual Therapy  Location of Patient: Work (address obtained; private location) Location of Provider: Provider's Home (private office) Type of Contact: Telepsychological Visit via MyChart Video Visit  Session Content: Alexis [name] is a 38 y.o. male presenting for a follow-up appointment to address the previously established treatment goal of increasing coping skills. Today's appointment was a telepsychological visit. Alexis [name] provided verbal consent for today's telepsychological appointment and he is aware he is responsible for securing confidentiality on his end of the session. Prior to proceeding with today's appointment, Alexis [name]'s physical location at the time of this appointment was obtained as well a phone number he could be reached at in the event of technical difficulties. Alexis [name] and this provider participated in today's telepsychological service.   This provider conducted a brief check-in. Melecio noted, Nothing new, really. He shared about his recent OV with Dr. Berkeley. Associated thoughts and feelings were processed. Reviewed self-compassion. Notably, Alexis [name] acknowledged eating to cope with conflict resulting in poor communication/lack of communication. Further explored and processed. Psychoeducation provided regarding assertive, aggressive, and passive communication. Various examples were utilized to practice appropriate communication. Overall, Alexis [name] was receptive to today's appointment as evidenced by openness to sharing, responsiveness to feedback, and willingness to implement discussed strategies .  Mental Status Examination:  Appearance: neat Behavior: appropriate to circumstances Mood: anxious Affect: mood congruent Speech: WNL Eye Contact: appropriate Psychomotor Activity: WNL Gait: unable to assess Thought  Process: linear, logical, and goal directed and no evidence or endorsement of suicidal, homicidal, and self-harm ideation, plan and intent  Thought Content/Perception: no hallucinations, delusions, bizarre thinking or behavior endorsed or observed Orientation: time, person, place, and purpose of appointment Memory/Concentration: intact Insight: good Judgment: good  Interventions:  Conducted a brief chart review Provided empathic reflections and validation Reviewed content from the previous session Provided positive reinforcement Employed supportive psychotherapy interventions to facilitate reduced distress and to improve coping skills with identified stressors Psychoeducation provided regarding communication styles  DSM-5 Diagnosis(es): F50.89 Other Specified Feeding or Eating Disorder, Emotional Eating Behaviors, F90.9 Unspecified Attention-Deficit/Hyperactivity Disorder  and F41.9 Unspecified Anxiety Disorder  Treatment Goal & Progress: During the initial appointment with this provider, the following treatment goal was established: increase coping skills. Alexis [name] has demonstrated progress in his goal as evidenced by increased awareness of hunger patterns and increased awareness of triggers for emotional eating behaviors. Alexis [name] also continues to demonstrate willingness to engage in learned skill(s).   Plan: The next appointment is scheduled for 08/28/2024 at 4pm, which will be via MyChart Video Visit. The next session will focus on working towards the established treatment goal.   Wyatt Fire, PsyD

## 2024-08-18 NOTE — Progress Notes (Signed)
 Cardiology Office Note   Date:  08/21/2024  ID:  Alexis [name], DOB Aug 05, 1986, MRN [id number] PCP: Katina Pfeiffer, PA-C  [facility] Providers Cardiologist:  Caron Poser, MD     History of Present Illness Alexis [name] is a 38 y.o. male PMH morbid obesity, prediabetes, metabolic syndrome who presents for further evaluation and management of elevated LPA.  Patient has been following with weight loss clinic recently.  He has lost over 100 pounds which is great.  LDL was 114 02/2024.  LPA was 88.6 06/2024.  He reports he strong family history of premature ASCVD in his maternal relatives.  His mother and some of his uncles and grandparents have had numerous MIs in their 37s and 25s.  He also notes about a month ago he was having some chest discomfort.  He also had some palpitations and his Apple Watch showed ventricular bigeminy.  Relevant CVD History -TEE 2017 normal biventricular function, no valvular disease (done for bacteremia) - TTE 04/2016 normal biventricular function without valvular disease   ROS: Pt denies any chest discomfort, jaw pain, arm pain, palpitations, syncope, presyncope, orthopnea, PND, or LE edema.  Studies Reviewed I have independently reviewed the patient's ECG, previous cardiac testing, recent medical record.  Physical Exam VS:  BP 110/72 (BP Location: Right Arm, Patient Position: Sitting, Cuff Size: Large)   Pulse 86   Ht 6' 6 (1.981 m)   Wt (!) 380 lb (172.4 kg)   SpO2 98%   BMI 43.91 kg/m        Wt Readings from Last 3 Encounters:  08/21/24 (!) 380 lb (172.4 kg)  07/27/24 (!) 376 lb (170.6 kg)  05/29/24 (!) 376 lb (170.6 kg)    GEN: No acute distress. NECK: No JVD; No carotid bruits. CARDIAC: RRR, no murmurs, rubs, gallops. RESPIRATORY:  Clear to auscultation. EXTREMITIES:  Warm and well-perfused. No edema.  ASSESSMENT AND PLAN Elevated LPA HLD Cardiac risk counseling Family history of premature ASCVD Patient presents for  further evaluation of elevated LPA and HLD.  He has a significant family history of multiple first-degree relatives having premature MIs.  His LPA was 88 06/2024.  His LDL cholesterol was 114 02/2024.  I recommend we start Crestor 5 mg daily; he reports he would like to continue lifestyle measures for now and wait until further testing is available. I would like his LDL to be <100 for now.  Plan: - As above, will hold off on starting Crestor per patient request for now.  I did advise him that I will re-recommended if his testing is abnormal - After we do some initial testing, I think it would be a good idea for him to see our genetic counselors for further stratification  Chest discomfort Patient had some chest discomfort about a month ago.  Sensation was difficult to describe.  Given his strong family history, we need to look into this further.  Plan: - Coronary CT angiogram; if he has any degree of plaque, then his LDL goal should be <70 or perhaps even <55 given his family Hx  Frequent PVCs Patient showed me a tracing from his Apple watch where he was in ventricular bigeminy.  Will obtain echocardiogram to ensure no structural causes of PVCs as well as a monitor to measure PVC burden.  He is not overly symptomatic from this at this time, so we can withhold suppressive therapy for the time being until we have more information.  Morbid obesity Complicating all aspects of care.  He has lost over 100 pounds which is fantastic.  We briefly discussed GLP-1 medicines.  He would like to continue following with weight loss clinic for now.  If we get stuck or progress halts, then this would be a good medication for him.  We can set him up with our pharmacist to discuss options if this is the case.        Dispo: RTC 2 months  Signed, Caron Poser, MD

## 2024-08-21 ENCOUNTER — Ambulatory Visit

## 2024-08-21 ENCOUNTER — Ambulatory Visit (INDEPENDENT_AMBULATORY_CARE_PROVIDER_SITE_OTHER)

## 2024-08-21 VITALS — BP 110/72 | HR 86 | Ht 78.0 in | Wt 380.0 lb

## 2024-08-21 DIAGNOSIS — Z7189 Other specified counseling: Secondary | ICD-10-CM

## 2024-08-21 DIAGNOSIS — E782 Mixed hyperlipidemia: Secondary | ICD-10-CM | POA: Diagnosis not present

## 2024-08-21 DIAGNOSIS — Z8249 Family history of ischemic heart disease and other diseases of the circulatory system: Secondary | ICD-10-CM

## 2024-08-21 DIAGNOSIS — Z79899 Other long term (current) drug therapy: Secondary | ICD-10-CM

## 2024-08-21 DIAGNOSIS — R072 Precordial pain: Secondary | ICD-10-CM

## 2024-08-21 DIAGNOSIS — E7841 Elevated Lipoprotein(a): Secondary | ICD-10-CM | POA: Diagnosis not present

## 2024-08-21 DIAGNOSIS — I493 Ventricular premature depolarization: Secondary | ICD-10-CM

## 2024-08-21 MED ORDER — METOPROLOL TARTRATE 50 MG PO TABS: ORAL_TABLET | ORAL | 0 refills | Status: AC

## 2024-08-21 NOTE — Patient Instructions (Addendum)
 Medication Instructions:  Your physician recommends that you continue on your current medications as directed. Please refer to the Current Medication list given to you today.   *If you need a refill on your cardiac medications before your next appointment, please call your pharmacy*  Lab Work:  Your provider would like for you to have following labs drawn today BMET.   If you have labs (blood work) drawn today and your tests are completely normal, you will receive your results only by: MyChart Message (if you have MyChart) OR A paper copy in the mail If you have any lab test that is abnormal or we need to change your treatment, we will call you to review the results.  Testing/Procedures:  Your physician has requested that you have an echocardiogram. Echocardiography is a painless test that uses sound waves to create images of your heart. It provides your doctor with information about the size and shape of your heart and how well your heart's chambers and valves are working.   You may receive an ultrasound enhancing agent through an IV if needed to better visualize your heart during the echo. This procedure takes approximately one hour.  There are no restrictions for this procedure.  This will take place at [address] [facility] [address]) [address], [place] [address]  Please note: We ask at that you not bring children with you during ultrasound (echo/ vascular) testing. Due to room size and safety concerns, children are not allowed in the ultrasound rooms during exams. Our front office staff cannot provide observation of children in our lobby area while testing is being conducted. An adult accompanying a patient to their appointment will only be allowed in the ultrasound room at the discretion of the ultrasound technician under special circumstances. We apologize for any inconvenience.   CORONARY CT SCAN:  Your cardiac CT will be scheduled at one of the below locations:   [facility] [address] [place], [place] [address] [phone]  Please arrive 15 mins early for check-in and test prep.  There is spacious parking Copy Available) and easy access to the radiology department from the [facility] entrance. Please enter here and check-in with the desk attendant.   Please follow these instructions carefully (unless otherwise directed):  An IV will be required for this test and Nitroglycerin will be given.  Hold all erectile dysfunction medications at least 3 days (72 hrs) prior to test. (Ie viagra, cialis, sildenafil, tadalafil, etc)   On the Night Before the Test: Be sure to Drink plenty of water. Do not consume any caffeinated/decaffeinated beverages or chocolate 12 hours prior to your test. Do not take any antihistamines 12 hours prior to your test.  On the Day of the Test: Drink plenty of water until 1 hour prior to the test. Do not eat any food 1 hour prior to test. You may take your regular medications prior to the test.  Take metoprolol (Lopressor) 50 MG two hours prior to test. Please HOLD hydrochlorothiazide  12.5 mg on the morning of the test. Patients who wear a continuous glucose monitor MUST remove the device prior to scanning.      After the Test: Drink plenty of water. After receiving IV contrast, you may experience a mild flushed feeling. This is normal. On occasion, you may experience a mild rash up to 24 hours after the test. This is not dangerous. If this occurs, you can take Benadryl  25 mg, Zyrtec, Claritin , or  Allegra and increase your fluid intake. (Patients taking Tikosyn should avoid Benadryl , and may take Zyrtec, Claritin , or Allegra) If you experience trouble breathing, this can be serious. If it is severe call 911 IMMEDIATELY. If it is mild, please call our office.  We will call to schedule your test 2-4 weeks out understanding that some insurance companies will need an authorization prior to  the service being performed.   For more information and frequently asked questions, please visit our website : [email or web address]  For non-scheduling related questions, please contact the cardiac imaging nurse navigator should you have any questions/concerns: Cardiac Imaging Nurse Navigators Direct Office Dial: [phone]   For scheduling needs, including cancellations and rescheduling, please call Brittany, [phone].     ZIO XT- Long Term Monitor Instructions  Your physician has requested you wear a ZIO patch monitor for 14 days.  This is a single patch monitor. [employer] supplies one patch monitor per enrollment. Additional stickers are not available. Please do not apply patch if you will be having a Nuclear Stress Test, Echocardiogram, Cardiac CT, MRI, or Chest Xray during the period you would be wearing the monitor. The patch cannot be worn during these tests. You cannot remove and re-apply the ZIO XT patch monitor.  Your ZIO patch monitor will be mailed 3 day USPS to your address on file. It may take 3-5 days to receive your monitor after you have been enrolled. Once you have received your monitor, please review the enclosed instructions. Your monitor has already been registered assigning a specific monitor serial number to you.  Billing and Patient Assistance Program Information  We have supplied [employer] with any of your insurance information on file for billing purposes.  [employer] offers a sliding scale Patient Assistance Program for patients that do not have insurance, or whose insurance does not completely cover the cost of the ZIO monitor.  You must apply for the Patient Assistance Program to qualify for this discounted rate.  To apply, please call [employer] at [phone], select option 4, select option 2, ask to apply for Patient Assistance Program. Meredeth will ask your household income, and how many people are in your household. They will quote your out-of-pocket  cost based on that information. [employer] will also be able to set up a 11-month, interest-free payment plan if needed.  Applying the monitor   Shave hair from upper left chest.  Hold abrader disc by orange tab. Rub abrader in 40 strokes over the upper left chest as indicated in your monitor instructions.  Clean area with 4 enclosed alcohol pads. Let dry.  Apply patch as indicated in monitor instructions. Patch will be placed under collarbone on left side of chest with arrow pointing upward.  Rub patch adhesive wings for 2 minutes. Remove white label marked 1. Remove the white label marked 2. Rub patch adhesive wings for 2 additional minutes.  While looking in a mirror, press and release button in center of patch. A small green light will flash 3-4 times. This will be your only indicator that the monitor has been turned on.   After Applying Monitor: Do not shower for the first 24 hours. You may shower after the first 24 hours.  Press the button if you feel a symptom. You will hear a small click. Record Date, Time and Symptom in the Patient Logbook.   After Completing 14 Days: When you are ready to remove the patch, follow instructions on the last 2 pages of Patient Logbook.  Stick patch monitor  into the tabs at the bottom of the return box.  Place Patient Logbook in the blue and white box. Use locking tab on box and tape box closed securely. The blue and white box has prepaid postage on it. Please place it in the mailbox as soon as possible. Your physician should have your test results approximately 7-14 days after the monitor has been mailed back to [facility].   Troubleshooting: Call [facility] at [phone] if you have questions regarding your ZIO XT patch monitor.  Call them immediately if you see an orange light blinking on your monitor.  If your monitor falls off in less than 4 days, contact our Monitor department at [phone].  If your monitor becomes  loose or falls off after 4 days call [employer] at [phone] for suggestions on securing your monitor.   Follow-Up: At [facility], you and your health needs are our priority.  As part of our continuing mission to provide you with exceptional heart care, our providers are all part of one team.  This team includes your primary Cardiologist (physician) and Advanced Practice Providers or APPs (Physician Assistants and Nurse Practitioners) who all work together to provide you with the care you need, when you need it.  Your next appointment:   2 month(s)  Provider:   Caron Poser, MD    We recommend signing up for the patient portal called MyChart.  Sign up information is provided on this After Visit Summary.  MyChart is used to connect with patients for Virtual Visits (Telemedicine).  Patients are able to view lab/test results, encounter notes, upcoming appointments, etc.  Non-urgent messages can be sent to your provider as well.   To learn more about what you can do with MyChart, go to [email or web address].

## 2024-08-28 ENCOUNTER — Telehealth (INDEPENDENT_AMBULATORY_CARE_PROVIDER_SITE_OTHER): Payer: Self-pay | Admitting: Psychology

## 2024-08-28 DIAGNOSIS — F419 Anxiety disorder, unspecified: Secondary | ICD-10-CM

## 2024-08-28 DIAGNOSIS — F5089 Other specified eating disorder: Secondary | ICD-10-CM

## 2024-08-28 DIAGNOSIS — F909 Attention-deficit hyperactivity disorder, unspecified type: Secondary | ICD-10-CM | POA: Diagnosis not present

## 2024-08-28 NOTE — Progress Notes (Signed)
  Office: [phone]  /  Fax: [phone]    Date: August 28, 2024  Appointment Start Time: 4:10pm Duration: 34 minutes Provider: Wyatt Fire, Psy.D. Type of Session: Individual Therapy  Location of Patient: Work (private location) Location of Provider: Provider's Home (private office) Type of Contact: Telepsychological Visit via MyChart Video Visit  Session Content: Alexis [name] is a 38 y.o. male presenting for a follow-up appointment to address the previously established treatment goal of increasing coping skills.Today's appointment was a telepsychological visit. Alexis [name] provided verbal consent for today's telepsychological appointment and he is aware he is responsible for securing confidentiality on his end of the session. Prior to proceeding with today's appointment, Alexis [name]'s physical location at the time of this appointment was obtained as well a phone number he could be reached at in the event of technical difficulties. Alexis [name] and this provider participated in today's telepsychological service.   This provider conducted a brief check-in. Alexis [name] shared he is good today. This provider shared she will be leaving the practice mid-December. Alexis [name]'s thoughts/feelings were explored and processed. He requested to continue with this provider at [facility] and asked for a referral to be placed. Regarding eating habits, Alexis [name] reported he is bored with his current prescribed structured meal plan which has resulted in frequent deviations. He also reported encountering frequent triggers due to the holidays and availability of sweets. Further explored. Alexis [name] acknowledged difficulty eating everything prescribed on his structured meal plan resulting to frequent deviations. Reflected on the last time he ate something from his prescribed structured meal plan and explored what helped him achieve that. Psychoeducation regarding the hunger and satisfaction scale was provided. [name] provided verbal consent during  today's appointment for this provider to send the scale via e-mail. Overall, Alexis [name] was receptive to today's appointment as evidenced by openness to sharing, responsiveness to feedback, and willingness to implement discussed strategies .  Mental Status Examination:  Appearance: neat Behavior: appropriate to circumstances Mood: neutral Affect: mood congruent Speech: WNL Eye Contact: appropriate Psychomotor Activity: WNL Gait: unable to assess Thought Process: linear, logical, and goal directed and no evidence or endorsement of suicidal, homicidal, and self-harm ideation, plan and intent  Thought Content/Perception: no hallucinations, delusions, bizarre thinking or behavior endorsed or observed Orientation: time, person, place, and purpose of appointment Memory/Concentration: intact Insight: good Judgment: good  Interventions:  Conducted a brief chart review Provided empathic reflections and validation Provided positive reinforcement Employed supportive psychotherapy interventions to facilitate reduced distress and to improve coping skills with identified stressors Engaged patient in problem solving Psychoeducation provided regarding the hunger and satisfaction scale  DSM-5 Diagnosis(es): F50.89 Other Specified Feeding or Eating Disorder, Emotional Eating Behaviors, F90.9 Unspecified Attention-Deficit/Hyperactivity Disorder  and F41.9 Unspecified Anxiety Disorder  Treatment Goal & Progress: During the initial appointment with this provider, the following treatment goal was established: increase coping skills. Alexis [name] has demonstrated progress in his goal as evidenced by increased awareness of hunger patterns and increased awareness of triggers for emotional eating behaviors. Alexis [name] also continues to demonstrate willingness to engage in learned skill(s).   Plan: The next appointment is scheduled for 09/04/2024 at 3:30pm, which will be via MyChart Video Visit. The next session will focus on  working towards the established treatment goal. Additionally, he provided verbal consent for this provider to place a referral with [facility].    Wyatt Fire, PsyD

## 2024-08-31 LAB — BASIC METABOLIC PANEL WITH GFR
BUN: 14 (ref 4–21)
CO2: 20 (ref 13–22)
Chloride: 104 (ref 99–108)
Creatinine: 1.1 (ref 0.6–1.3)
Glucose: 77
Potassium: 4 meq/L (ref 3.5–5.1)
Sodium: 137 (ref 137–147)

## 2024-08-31 LAB — IRON,TIBC AND FERRITIN PANEL
%SAT: 13
Ferritin: 84
Iron: 39
TIBC: 297
UIBC: 258

## 2024-08-31 LAB — HEMOGLOBIN A1C: Hemoglobin A1C: 5.2

## 2024-08-31 LAB — HEPATIC FUNCTION PANEL
ALT: 19 U/L (ref 10–40)
AST: 20 (ref 14–40)
Alkaline Phosphatase: 70 (ref 25–125)
Bilirubin, Total: 0.8

## 2024-08-31 LAB — COMPREHENSIVE METABOLIC PANEL WITH GFR
Albumin: 3.8 (ref 3.5–5.0)
Calcium: 9 (ref 8.7–10.7)
Globulin: 2.6

## 2024-08-31 LAB — CBC AND DIFFERENTIAL
HCT: 47 (ref 41–53)
Hemoglobin: 14.5 (ref 13.5–17.5)
Platelets: 266 K/uL (ref 150–400)
WBC: 8.2

## 2024-08-31 LAB — CBC: RBC: 5.66 — AB (ref 3.87–5.11)

## 2024-08-31 LAB — VITAMIN B12: Vitamin B-12: 710

## 2024-08-31 LAB — VITAMIN D 25 HYDROXY (VIT D DEFICIENCY, FRACTURES): Vit D, 25-Hydroxy: 34

## 2024-09-01 LAB — LAB REPORT - SCANNED
A1c: 5.2
EGFR: 88

## 2024-09-04 ENCOUNTER — Telehealth (INDEPENDENT_AMBULATORY_CARE_PROVIDER_SITE_OTHER): Payer: Self-pay | Admitting: Psychology

## 2024-09-04 ENCOUNTER — Telehealth (INDEPENDENT_AMBULATORY_CARE_PROVIDER_SITE_OTHER): Admitting: Psychology

## 2024-09-04 NOTE — Telephone Encounter (Signed)
  Office: [phone]  /  Fax: [phone]  Date of Call: September 04, 2024  Provider: Wyatt Fire, PsyD  CONTENT: This provider called Alexis [name] to check-in as he did not present for today's MyChart Video Visit appointment. A HIPAA compliant voicemail was left requesting a call back. Of note, this provider stayed on the MyChart Video Visit appointment for 5 minutes prior to signing off per the clinic's grace period policy.    PLAN: This provider will wait for Alexis [name] to call back. He is scheduled for a f/u on 09/12/2024 at 4pm. No further follow-up planned by this provider.

## 2024-09-04 NOTE — Progress Notes (Unsigned)
  Office: [phone]  /  Fax: [phone]    Date: September 04, 2024  Appointment Start Time: *** Duration: *** minutes Provider: Wyatt Fire, Psy.D. Type of Session: Individual Therapy  Location of Patient: {gbptloc:23249} (private location) Location of Provider: Provider's Home (private office) Type of Contact: Telepsychological Visit via MyChart Video Visit  Session Content: This provider called Alexis [name] at 3:33pm as he did not present for today's appointment. A HIPAA compliant voicemail was left requesting a call back. Alexis [name] was observed joining shortly after. As such, today's appointment was initiated *** minutes late.Alexis [name] is a 38 y.o. male presenting for a follow-up appointment to address the previously established treatment goal of increasing coping skills.Today's appointment was a telepsychological visit. Alexis [name] provided verbal consent for today's telepsychological appointment and he is aware he is responsible for securing confidentiality on his end of the session. Prior to proceeding with today's appointment, Alexis [name]'s physical location at the time of this appointment was obtained as well a phone number he could be reached at in the event of technical difficulties. Alexis [name] and this provider participated in today's telepsychological service.   This provider conducted a brief check-in. *** Alexis [name] was receptive to today's appointment as evidenced by openness to sharing, responsiveness to feedback, and {gbreceptiveness:23401}.  Mental Status Examination:  Appearance: {Appearance:22431} Behavior: {Behavior:22445} Mood: {gbmood:21757} Affect: {Affect:22436} Speech: {Speech:22432} Eye Contact: {Eye Contact:22433} Psychomotor Activity: {Motor Activity:22434} Gait: {gbgait:23404} Thought Process: {thought process:22448}  Thought Content/Perception: {disturbances:22451} Orientation: {Orientation:22437} Memory/Concentration: {gbcognition:22449} Insight: {Insight:22446} Judgment:  {Insight:22446}  Interventions:  {Interventions for Progress Notes:23405}  DSM-5 Diagnosis(es): F50.89 Other Specified Feeding or Eating Disorder, Emotional Eating Behaviors, F90.9 Unspecified Attention-Deficit/Hyperactivity Disorder  and F41.9 Unspecified Anxiety Disorder  Treatment Goal & Progress: During the initial appointment with this provider, the following treatment goal was established: increase coping skills. Alexis [name] has demonstrated progress in his goal as evidenced by increased awareness of hunger patterns and increased awareness of triggers for emotional eating behaviors. Alexis [name] also continues to demonstrate willingness to engage in learned skill(s).   Plan: The next appointment is scheduled for *** at ***, which will be via MyChart Video Visit. The next session will focus on {Plan for Next Appointment:23400}.   Wyatt Fire, PsyD

## 2024-09-05 ENCOUNTER — Other Ambulatory Visit (INDEPENDENT_AMBULATORY_CARE_PROVIDER_SITE_OTHER): Payer: Self-pay

## 2024-09-11 ENCOUNTER — Encounter (INDEPENDENT_AMBULATORY_CARE_PROVIDER_SITE_OTHER): Payer: Self-pay | Admitting: Family Medicine

## 2024-09-11 ENCOUNTER — Ambulatory Visit (INDEPENDENT_AMBULATORY_CARE_PROVIDER_SITE_OTHER): Payer: Self-pay | Admitting: Family Medicine

## 2024-09-11 VITALS — BP 107/72 | HR 80 | Temp 98.2°F | Ht 76.0 in | Wt 378.0 lb

## 2024-09-11 DIAGNOSIS — Z6841 Body Mass Index (BMI) 40.0 and over, adult: Secondary | ICD-10-CM

## 2024-09-11 DIAGNOSIS — E66813 Obesity, class 3: Secondary | ICD-10-CM

## 2024-09-11 DIAGNOSIS — R454 Irritability and anger: Secondary | ICD-10-CM | POA: Diagnosis not present

## 2024-09-11 DIAGNOSIS — R632 Polyphagia: Secondary | ICD-10-CM | POA: Diagnosis not present

## 2024-09-11 DIAGNOSIS — E669 Obesity, unspecified: Secondary | ICD-10-CM | POA: Diagnosis not present

## 2024-09-11 MED ORDER — ESCITALOPRAM OXALATE 10 MG PO TABS: 10.0000 mg | ORAL_TABLET | Freq: Every day | ORAL | 1 refills | Status: AC

## 2024-09-11 MED ORDER — TOPIRAMATE 100 MG PO TABS: 100.0000 mg | ORAL_TABLET | Freq: Every day | ORAL | 0 refills | Status: DC

## 2024-09-11 MED ORDER — PHENTERMINE HCL 15 MG PO CAPS: 15.0000 mg | ORAL_CAPSULE | Freq: Every day | ORAL | 0 refills | Status: DC

## 2024-09-11 NOTE — Progress Notes (Unsigned)
 SUBJECTIVE:  Chief Complaint: Obesity  Interim History: Patient has had quite a bit going on since his last appointment.  He saw cardiology and has echo pending, cardiac CT pending and is wearing a holter monitor.  He hasn't been as consistent with his food intake due to the holiday season.  He has been trying to stay focused on getting nutritious food more consistently.   Michae is here to discuss his progress with his obesity treatment plan. He is on the Category 4 Plan and states he is following his eating plan approximately 50 % of the time. He states he is doing yoga 120 minutes per week.   OBJECTIVE: Visit Diagnoses: Problem List Items Addressed This Visit       Other   Polyphagia - Primary   Relevant Medications   phentermine  15 MG capsule   topiramate  (TOPAMAX ) 100 MG tablet   Class 3 severe obesity with serious comorbidity and body mass index (BMI) of 50.0 to 59.9 in adult [facility])   Relevant Medications   phentermine  15 MG capsule   Other Visit Diagnoses       BMI 45.0-49.9, adult (HCC)       Relevant Medications   phentermine  15 MG capsule     Irritability       Relevant Medications   escitalopram  (LEXAPRO ) 10 MG tablet       Vitals Temp: 98.2 F (36.8 C) BP: 107/72 Pulse Rate: 80 SpO2: 97 %   Anthropometric Measurements Height: 6' 4 (1.93 m) Weight: (!) 378 lb (171.5 kg) BMI (Calculated): 46.03 Weight at Last Visit: 376 lb Weight Lost Since Last Visit: 0 Weight Gained Since Last Visit: 2 Starting Weight: 477 lb Total Weight Loss (lbs): 99 lb (44.9 kg)   Body Composition  Body Fat %: 41.3 % Fat Mass (lbs): 156.2 lbs Muscle Mass (lbs): 211.4 lbs Total Body Water (lbs): 166.2 lbs Visceral Fat Rating : 25   Other Clinical Data Today's Visit #: 92 Starting Date: 12/02/21 Comments: Cat 4     ASSESSMENT AND PLAN: Assessment & Plan Polyphagia Patient started combination phentermine  and topiramate  if 469 pounds.  Current weight of 378  pounds.  No side effects of medications reported.  Needs refills of phentermine  and topiramate  today.  PDMP was checked and no concerns were identified.  1 month worth of refills sent into pharmacy for patient. Irritability Improvement in mood on Lexapro .  Needs refill of Lexapro  today.  No suicidal or homicidal ideation reported.  Refill of Lexapro  sent to pharmacy. BMI 45.0-49.9, adult (HCC)  Obesity, starting BMI 58.06    Diet: Arty is currently in the action stage of change. As such, his goal is to continue with weight loss efforts and has agreed to the Category 4 Plan.   Exercise:  For substantial health benefits, adults should do at least 150 minutes (2 hours and 30 minutes) a week of moderate-intensity, or 75 minutes (1 hour and 15 minutes) a week of vigorous-intensity aerobic physical activity, or an equivalent combination of moderate- and vigorous-intensity aerobic activity. Aerobic activity should be performed in episodes of at least 10 minutes, and preferably, it should be spread throughout the week.  Behavior Modification:  We discussed the following Behavioral Modification Strategies today: increasing lean protein intake, decreasing simple carbohydrates, increasing vegetables, no skipping meals, meal planning and cooking strategies, holiday eating strategies, avoiding temptations, and planning for success. We discussed various medication options to help Jerami with his weight loss efforts and we both agreed  to continue phentermine  and topiramate  at current dosage.  Return in about 6 weeks (around 10/23/2024).   He was informed of the importance of frequent follow up visits to maximize his success with intensive lifestyle modifications for his multiple health conditions.  Attestation Statements:   Reviewed by clinician on day of visit: allergies, medications, problem list, medical history, surgical history, family history, social history, and previous encounter notes.      Adelita Cho, MD

## 2024-09-12 ENCOUNTER — Telehealth (INDEPENDENT_AMBULATORY_CARE_PROVIDER_SITE_OTHER): Admitting: Psychology

## 2024-09-12 DIAGNOSIS — F5089 Other specified eating disorder: Secondary | ICD-10-CM

## 2024-09-12 DIAGNOSIS — F909 Attention-deficit hyperactivity disorder, unspecified type: Secondary | ICD-10-CM | POA: Diagnosis not present

## 2024-09-12 DIAGNOSIS — F419 Anxiety disorder, unspecified: Secondary | ICD-10-CM | POA: Diagnosis not present

## 2024-09-12 NOTE — Progress Notes (Signed)
  Office: [phone]  /  Fax: [phone]    Date: September 12, 2024  Appointment Start Time: 4:03pm Duration: 35 minutes Provider: Wyatt Fire, Psy.D. Type of Session: Individual Therapy  Location of Patient: Work (private location) Location of Provider: Provider's Home (private office) Type of Contact: Telepsychological Visit via MyChart Video Visit  Session Content: Alexis [name] is a 38 y.o. male presenting for a follow-up appointment to address the previously established treatment goal of increasing coping skills. Today's appointment was a telepsychological visit. Alexis [name] provided verbal consent for today's telepsychological appointment and he is aware he is responsible for securing confidentiality on his end of the session. Prior to proceeding with today's appointment, Alexis [name]'s physical location at the time of this appointment was obtained as well a phone number he could be reached at in the event of technical difficulties. Alexis [name] and this provider participated in today's telepsychological service.   This provider conducted a brief check-in. Alexis [name] shared about recent weight gain. Associated thoughts and feelings were explored.  Explored what he feels contributed to his recent weight gain. Alexis [name] acknowledged decreased adherence to his prescribed structured meal plan and giving himself permission to deviate. Explored what he felt went well as it relates to eating during Thanksgiving and what he feels he can do differently during future holidays. Notably, he shared utilizing the hunger and satisfaction scale. His experience was processed. Of note, Alexis [name] acknowledged overcompensating by skipping meals after his first Thanksgiving celebration. Psychoeducation provided regarding all or nothing thinking. Recent example was utilized to identify unhelpful thinking patterns and Alexis [name] was engaged in problem solving for future situations. He agreed to continue utilizing the hunger and satisfaction  throughout the day, especially on holidays. Overall, Alexis [name] was receptive to today's appointment as evidenced by openness to sharing, responsiveness to feedback, and willingness to implement discussed strategies .  Mental Status Examination:  Appearance: neat Behavior: appropriate to circumstances Mood: neutral Affect: mood congruent Speech: WNL Eye Contact: appropriate Psychomotor Activity: WNL Gait: unable to assess Thought Process: linear, logical, and goal directed and no evidence or endorsement of suicidal, homicidal, and self-harm ideation, plan and intent  Thought Content/Perception: no hallucinations, delusions, bizarre thinking or behavior endorsed or observed Orientation: time, person, place, and purpose of appointment Memory/Concentration: intact Insight: good Judgment: good  Interventions:  Conducted a brief chart review Provided empathic reflections and validation Provided positive reinforcement Employed supportive psychotherapy interventions to facilitate reduced distress and to improve coping skills with identified stressors Engaged patient in problem solving Psychoeducation provided regarding all or nothing thinking   DSM-5 Diagnosis(es): F50.89 Other Specified Feeding or Eating Disorder, Emotional Eating Behaviors, F90.9 Unspecified Attention-Deficit/Hyperactivity Disorder  and F41.9 Unspecified Anxiety Disorder  Treatment Goal & Progress: During the initial appointment with this provider, the following treatment goal was established: increase coping skills. Alexis [name] has demonstrated progress in his goal as evidenced by increased awareness of hunger patterns and increased awareness of triggers for emotional eating behaviors. Alexis [name] also continues to demonstrate willingness to engage in learned skill(s).   Plan: The next appointment is scheduled for 09/19/2024 at 4pm, which will be via MyChart Video Visit. The next session will focus on working towards the established  treatment goal.   Wyatt Fire, PsyD

## 2024-09-12 NOTE — Assessment & Plan Note (Signed)
 Patient started combination phentermine  and topiramate  if 469 pounds.  Current weight of 378 pounds.  No side effects of medications reported.  Needs refills of phentermine  and topiramate  today.  PDMP was checked and no concerns were identified.  1 month worth of refills sent into pharmacy for patient.

## 2024-09-19 ENCOUNTER — Telehealth (INDEPENDENT_AMBULATORY_CARE_PROVIDER_SITE_OTHER): Admitting: Psychology

## 2024-09-19 DIAGNOSIS — F909 Attention-deficit hyperactivity disorder, unspecified type: Secondary | ICD-10-CM

## 2024-09-19 DIAGNOSIS — F419 Anxiety disorder, unspecified: Secondary | ICD-10-CM

## 2024-09-19 DIAGNOSIS — F5089 Other specified eating disorder: Secondary | ICD-10-CM | POA: Diagnosis not present

## 2024-09-19 NOTE — Progress Notes (Signed)
  Office: [phone]  /  Fax: [phone]    Date: September 19, 2024  Appointment Start Time: 4:02pm Duration: 34 minutes Provider: Wyatt Fire, Psy.D. Type of Session: Individual Therapy  Location of Patient: Work (private location) Location of Provider: Provider's Home (private office) Type of Contact: Telepsychological Visit via MyChart Video Visit  Session Content: Alexis [name] is a 38 y.o. male presenting for a follow-up appointment to address the previously established treatment goal of increasing coping skills. Today's appointment was a telepsychological visit. Alexis [name] provided verbal consent for today's telepsychological appointment and he is aware he is responsible for securing confidentiality on his end of the session. Prior to proceeding with today's appointment, Alexis [name]'s physical location at the time of this appointment was obtained as well a phone number he could be reached at in the event of technical difficulties. Alexis [name] and this provider participated in today's telepsychological service.   This provider conducted a brief check-in. Alexis [name] shared about recent events, adding he is feeling overwhelmed and stressed as it relates to the holidays. Regarding eating habits, he described an improvement and he continues to utilize the hunger and satisfaction scale previously shared. He also described a reduction in all or nothing thinking. Remainder of today's appointment focused on the unpleasant emotions identified related to the upcoming holidays. Further explored. It was identified his paternal grandmother passed away during the holidays two years ago. Associated thoughts and feelings were processed. Brief psychoeducation provided regarding the grieving process. Overall, Alexis [name] was receptive to today's appointment as evidenced by openness to sharing, responsiveness to feedback, and willingness to continue engaging in learned skills.  Mental Status Examination:  Appearance: neat Behavior:  appropriate to circumstances Mood: sad Affect: mood congruent; tearful when discussing holidays/grandmother Speech: WNL Eye Contact: appropriate Psychomotor Activity: WNL Gait: unable to assess Thought Process: linear, logical, and goal directed and no evidence or endorsement of suicidal, homicidal, and self-harm ideation, plan and intent  Thought Content/Perception: no hallucinations, delusions, bizarre thinking or behavior endorsed or observed Orientation: time, person, place, and purpose of appointment Memory/Concentration: intact Insight: good Judgment: good  Interventions:  Conducted a brief chart review Provided empathic reflections and validation Provided positive reinforcement Employed supportive psychotherapy interventions to facilitate reduced distress and to improve coping skills with identified stressors  DSM-5 Diagnosis(es): F50.89 Other Specified Feeding or Eating Disorder, Emotional Eating Behaviors, F90.9 Unspecified Attention-Deficit/Hyperactivity Disorder  and F41.9 Unspecified Anxiety Disorder  Treatment Goal & Progress: During the initial appointment with this provider, the following treatment goal was established: increase coping skills. Alexis [name] demonstrated progress in his goal as evidenced by increased awareness of hunger patterns and increased awareness of triggers for emotional eating behaviors. Alexis [name] also continues to demonstrate willingness to engage in learned skill(s).    Plan: Today was [facility] last appointment with this provider. He will continue therapeutic services with this provider at [facility] in the new year.    Wyatt Fire, PsyD

## 2024-09-21 ENCOUNTER — Ambulatory Visit: Payer: Self-pay

## 2024-09-21 DIAGNOSIS — Z8249 Family history of ischemic heart disease and other diseases of the circulatory system: Secondary | ICD-10-CM | POA: Diagnosis not present

## 2024-09-21 DIAGNOSIS — R072 Precordial pain: Secondary | ICD-10-CM

## 2024-09-21 DIAGNOSIS — I493 Ventricular premature depolarization: Secondary | ICD-10-CM

## 2024-09-28 ENCOUNTER — Encounter (HOSPITAL_COMMUNITY): Payer: Self-pay

## 2024-09-29 ENCOUNTER — Telehealth (HOSPITAL_COMMUNITY): Payer: Self-pay | Admitting: *Deleted

## 2024-09-29 NOTE — Telephone Encounter (Signed)
 Attempted to call patient regarding upcoming cardiac CT appointment. Left message on voicemail with name and callback number Sid Seats RN Navigator Cardiac Imaging [facility] [phone] Office

## 2024-10-02 ENCOUNTER — Ambulatory Visit

## 2024-10-11 ENCOUNTER — Ambulatory Visit

## 2024-10-19 ENCOUNTER — Encounter (HOSPITAL_COMMUNITY): Payer: Self-pay

## 2024-10-23 ENCOUNTER — Ambulatory Visit
Admission: RE | Admit: 2024-10-23 | Discharge: 2024-10-23 | Disposition: A | Discharge status: Home / Self Care | Source: Ambulatory Visit

## 2024-10-23 DIAGNOSIS — R072 Precordial pain: Secondary | ICD-10-CM | POA: Insufficient documentation

## 2024-10-23 MED ORDER — DILTIAZEM HCL 25 MG/5ML IV SOLN
10.0000 mg | INTRAVENOUS | Status: DC | PRN
  Filled 2024-10-23: qty 5

## 2024-10-23 MED ORDER — IOHEXOL 350 MG/ML SOLN
100.0000 mL | Freq: Once | INTRAVENOUS | Status: AC | PRN
  Administered 2024-10-23: 100 mL via INTRAVENOUS

## 2024-10-23 MED ORDER — NITROGLYCERIN 0.4 MG SL SUBL
0.8000 mg | SUBLINGUAL_TABLET | Freq: Once | SUBLINGUAL | Status: AC
  Administered 2024-10-23: 0.8 mg via SUBLINGUAL
  Filled 2024-10-23: qty 25

## 2024-10-23 MED ORDER — METOPROLOL TARTRATE 5 MG/5ML IV SOLN
10.0000 mg | Freq: Once | INTRAVENOUS | Status: DC | PRN
  Filled 2024-10-23: qty 10

## 2024-10-23 NOTE — Progress Notes (Unsigned)
 " Cardiology Office Note   Date:  10/24/2024  ID:  Alexis [name], DOB April 17, 1986, MRN [id number] PCP: Katina Pfeiffer, PA-C  [facility] Providers Cardiologist:  Caron Poser, MD     History of Present Illness Alexis [name] is a 39 y.o. male PMH morbid obesity, prediabetes, metabolic syndrome who presents for further evaluation and management of elevated LPA.  Patient has been following with weight loss clinic recently.  He has lost over 100 pounds which is great.  LDL was 114 02/2024.  LPA was 88.6 06/2024.  He reports he strong family history of premature ASCVD in his maternal relatives.  His mother and some of his uncles and grandparents have had numerous MIs in their 3s and 70s.  He also notes about a month ago he was having some chest discomfort.  He also had some palpitations and his Apple Watch showed ventricular bigeminy.  Interval history: Since last visit, we obtained a monitor which showed rare nonsustained episodes of SVT, 1 of which corresponded to a patient trigger.  Discussed starting metoprolol , but patient wanted to wait until follow-up.  A coronary CTA was obtained which showed minimal stenosis of the proximal LAD due to calcified plaque.  Relevant CVD History -CCTA 10/2024 CAD-RADS 1 with minimal stenosis in pLAD; 99th percentile calcium  score -Monitor 09/2024 mean heart rate 79 bpm, rare ectopy, 2 episodes of SVT (longest 18 beats, max heart rate 188 bpm).  One of the episodes corresponded to a patient trigger.  A single 4 beat episode of NSVT.  Triggers corresponded to SVT, sinus rhythm (predominantly), and PVCs. -TEE 2017 normal biventricular function, no valvular disease (done for bacteremia) - TTE 04/2016 normal biventricular function without valvular disease   ROS: Pt denies any chest discomfort, jaw pain, arm pain, palpitations, syncope, presyncope, orthopnea, PND, or LE edema.  Studies Reviewed I have independently reviewed the patient's ECG, previous  cardiac testing, recent medical record.  Physical Exam VS:  BP 120/80 (BP Location: Left Arm, Patient Position: Sitting, Cuff Size: Large)   Pulse 76   Ht 6' 6 (1.981 m)   Wt (!) 386 lb (175.1 kg)   SpO2 100%   BMI 44.61 kg/m        Wt Readings from Last 3 Encounters:  10/24/24 (!) 386 lb (175.1 kg)  09/11/24 (!) 378 lb (171.5 kg)  08/21/24 (!) 380 lb (172.4 kg)    GEN: No acute distress. NECK: No JVD; No carotid bruits. CARDIAC: RRR, no murmurs, rubs, gallops. RESPIRATORY:  Clear to auscultation. EXTREMITIES:  Warm and well-perfused. No edema.  ASSESSMENT AND PLAN Elevated LPA HLD Nonobstructive CAD Family history of premature ASCVD Patient presents for further evaluation of elevated LPA and HLD.  He has a significant family history of multiple first-degree relatives having premature MIs.  His LPA was 88 06/2024.  His LDL cholesterol was 114 02/2024.  He was having some chest symptoms, so we ultimately obtained a coronary CTA which showed CAD RADS 1 minimal calcified plaque of the proximal LAD.  Plan: - Start ASA 81 mg daily - Start Crestor  5 mg daily; goal LDL less than 70 - Plan to recheck lipids in 2 to 3 months - Referral to lipid clinic for further advanced risk stratification/management per their discretion  pSVT PVCs Patient showed me a tracing from his Apple watch where he was in ventricular bigeminy.  We obtained a monitor which showed rare ectopy, 2 episodes of SVT (longest 18 beats, max heart rate 188 bpm),  and a single brief run of NSVT.  No sustained arrhythmias.  Plan: - Echocardiogram pending - Discussed suppression of these with low-dose metoprolol ; he notes the symptoms are not overly bothersome or frequent, so we decided that we could hold off on beta-blocker therapy for now.  I advised him to reach back out to me should this become more severe/more frequent and we can start therapy  Morbid obesity Complicating all aspects of care.  He has lost over 100  pounds which is fantastic.  We briefly discussed GLP-1 medicines.  He would like to continue following with weight loss clinic for now.  If we get stuck or progress halts, then this would be a good medication for him.  We can set him up with our pharmacist to discuss options if this is the case.        Dispo: RTC 1 year or sooner PRN  Signed, Caron Poser, MD  "

## 2024-10-23 NOTE — Progress Notes (Signed)
 Patient tolerated procedure well. W/C to lobby.  Ambulate w/o difficulty. Denies light headedness or being dizzy. Encouraged to drink extra water today and reasoning explained. Verbalized understanding. All questions answered. ABC intact. No further needs. Discharge from procedure area w/o issues.

## 2024-10-24 ENCOUNTER — Ambulatory Visit

## 2024-10-24 VITALS — BP 120/80 | HR 76 | Ht 78.0 in | Wt 386.0 lb

## 2024-10-24 DIAGNOSIS — E782 Mixed hyperlipidemia: Secondary | ICD-10-CM

## 2024-10-24 DIAGNOSIS — E7841 Elevated Lipoprotein(a): Secondary | ICD-10-CM | POA: Diagnosis not present

## 2024-10-24 DIAGNOSIS — R072 Precordial pain: Secondary | ICD-10-CM | POA: Diagnosis not present

## 2024-10-24 DIAGNOSIS — Z7189 Other specified counseling: Secondary | ICD-10-CM | POA: Diagnosis not present

## 2024-10-24 DIAGNOSIS — Z8249 Family history of ischemic heart disease and other diseases of the circulatory system: Secondary | ICD-10-CM | POA: Diagnosis not present

## 2024-10-24 DIAGNOSIS — I471 Supraventricular tachycardia, unspecified: Secondary | ICD-10-CM | POA: Diagnosis not present

## 2024-10-24 MED ORDER — ROSUVASTATIN CALCIUM 5 MG PO TABS: 5.0000 mg | ORAL_TABLET | Freq: Every day | ORAL | 3 refills | Status: AC

## 2024-10-24 MED ORDER — ASPIRIN 81 MG PO TBEC: 81.0000 mg | DELAYED_RELEASE_TABLET | Freq: Every day | ORAL | 3 refills | Status: AC

## 2024-10-24 NOTE — Patient Instructions (Signed)
 Your physician recommends the following medication changes.    START TAKING: Baby Aspirin  81 mg once daily. Crestor  5 mg once daily.   Lab Work: No labs ordered today    Testing/Procedures: No test ordered today   Follow-Up:  Referral to the  Lipid Disorder Clinic in [place].   At [facility], you and your health needs are our priority.  As part of our continuing mission to provide you with exceptional heart care, our providers are all part of one team.  This team includes your primary Cardiologist (physician) and Advanced Practice Providers or APPs (Physician Assistants and Nurse Practitioners) who all work together to provide you with the care you need, when you need it.  Your next appointment:   1 year(s)  Provider:   Suzann Riddle, NP

## 2024-10-26 ENCOUNTER — Ambulatory Visit (INDEPENDENT_AMBULATORY_CARE_PROVIDER_SITE_OTHER): Admitting: Family Medicine

## 2024-10-30 ENCOUNTER — Ambulatory Visit: Admitting: Psychology

## 2024-10-30 DIAGNOSIS — F5089 Other specified eating disorder: Secondary | ICD-10-CM

## 2024-10-30 DIAGNOSIS — F411 Generalized anxiety disorder: Secondary | ICD-10-CM

## 2024-10-30 DIAGNOSIS — F331 Major depressive disorder, recurrent, moderate: Secondary | ICD-10-CM

## 2024-10-30 DIAGNOSIS — F909 Attention-deficit hyperactivity disorder, unspecified type: Secondary | ICD-10-CM

## 2024-10-30 NOTE — Progress Notes (Addendum)
 "                                                            Main Office Phone: [phone] Fax: [phone]   Date: October 30, 2024    Name: Alexsander Cavins. Cabello MRN: [id number] Appointment Start Time: 4:05pm Duration: 62 minutes Provider: Wyatt Fire, Psy.D. Type of Session: Intake for Individual Therapy  Location of Patient: Work Location of Provider: Provider's Office  Type of Contact: Telepsychological Visit via Academic Librarian (video & audio capabilities)  Informed Consent: Prior to proceeding with today's appointment, two pieces of identifying information were obtained. In addition, Zade's physical location at the time of this appointment was obtained as well a phone number he could be reached at in the event of technical difficulties. Lambert and this provider participated in today's telepsychological service.   The provider's role was explained to [facility]. The provider reviewed and discussed issues of confidentiality, privacy, and limits therein (e.g., reporting obligations). In addition to verbal informed consent, written informed consent for psychological services was obtained prior to the initial appointment. Since the clinic is not a 24/7 crisis center, mental health emergency resources were discussed and this provider explained MyChart, e-mail, voicemail, and/or other messaging systems should be utilized only for non-emergency reasons. This provider also explained that information obtained during appointments will be securely placed in [facility] medical record. He is aware he is responsible for securing confidentiality on his end of the session. Kharter verbally consented to proceed. All questions/concerns related to new patient paperwork were addressed.   Chief Complaint/HPI: Dayna previously met with this provider at [facility] to address disordered eating behaviors. The last appointment was 09/19/2024.   During today's appointment, [name] reported ADHD-related  symptoms have been up and down for him. He shared his kindergarten teacher recommended his parents pursue assessment and he was prescribed medication to address the symptoms after he was diagnosed by his pediatrician. He indicated he ceased use of Strattera in college, adding he tried all the medications (Ritalin, Adderall, Concerta, Strattera). He recalled his paternal grandfather refused to watch him as a child if he was not medicated. Kysean explained he ceased use of medication as he got tired of taking it. Currently, he indicated he can see the benefit of taking medication again, but expressed ambivalence due to side effects. He reported experiencing the following symptoms currently: time blindness, hyperfocus, organization, procrastination, forgetfulness, and fidgeting. Regarding emotional eating behaviors, Aeric described a reduction; however, he shared examples from the holidays. He disclosed when he is feeling lack of control in other areas in his life, he will not eat. He denied engagement in other disordered eating behaviors. Moreover, Dina discussed experiencing challenges with self-doubt and confidence. He further reported a history of experiencing depression, noting he has been taking Wellbutrin  since age 38, noting he is unsure if it actually worked for him. He further reported a history of experiencing generalized anxiety, adding Lexapro  is helpful.   Mental Status Examination:  Appearance: neat Behavior: appropriate to circumstances Mood: anxious Affect: mood congruent Speech: WNL Eye Contact: appropriate Psychomotor Activity: WNL Gait: unable to assess Thought Process: linear, logical, and goal directed and denies suicidal, homicidal, and self-harm ideation, plan and intent  Thought Content/Perception: no hallucinations, delusions, bizarre thinking or behavior endorsed or  observed Orientation: AAOx4 Memory/Concentration: intact Insight/Judgment: fair  Family &  Psychosocial History: Kelvon reported he is in a relationship and he does not have any children. 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 Additionally, Remmy shared his highest level of education obtained is an associate's degree. Azir stated he resides with his parents part time and with his partner part time. Currently, Trigo's social support system consists of his partner, parents, and co-workers. Moreover, he indicated that he identifies with the following: Baptist. The following hobbies were noted: attending book club, cooking, baking, and going to [employer].  Medical History:  Past Medical History:  Diagnosis Date   Acid reflux    ADD 09/13/2007   ALLERGIC RHINITIS 06/10/2009   Anemia    Anxiety    Arrhythmia    Asthma Childhood   ASTHMA, UNSPECIFIED, UNSPECIFIED STATUS 07/19/2008   Back pain    Bacterial sinusitis    Bilateral swelling of feet    C. difficile diarrhea    Dental abscess    caused sinusitis   DEPRESSIVE DISORDER 09/13/2007   DYSMETABOLIC SYNDROME 10/25/2009   Gallbladder problem    GERD 09/13/2007   Headache    Heart murmur    HYPERLIPIDEMIA 10/10/2009   HYPERTENSION 09/13/2007   IBS 03/20/2009   IBS (irritable bowel syndrome)    Joint pain    Morbid obesity (HCC) 07/19/2008   Ocular migraine    Palpitations    Prediabetes    Skin cancer    SOB (shortness of breath)    Vitamin D  deficiency     Past Surgical History:  Procedure Laterality Date   ANKLE SURGERY  10/12/2005   APPENDECTOMY  10/13/2003   BIOPSY  06/02/2021   Procedure: BIOPSY;  Surgeon: Avram Lupita BRAVO, MD;  Location: [facility];  Service: Endoscopy;;  EGD and COLON   COLONOSCOPY WITH PROPOFOL  N/A 06/02/2021   Procedure: COLONOSCOPY WITH PROPOFOL ;  Surgeon: Avram Lupita BRAVO, MD;  Location: [facility];  Service: Endoscopy;  Laterality: N/A;   ESOPHAGOGASTRODUODENOSCOPY (EGD) WITH PROPOFOL  N/A 06/02/2021   Procedure:  ESOPHAGOGASTRODUODENOSCOPY (EGD) WITH PROPOFOL ;  Surgeon: Avram Lupita BRAVO, MD;  Location: [facility];  Service: Endoscopy;  Laterality: N/A;   IR GENERIC HISTORICAL  07/23/2016   IR FLUORO GUIDED NEEDLE PLC ASPIRATION/INJECTION LOC 07/23/2016 Toribio Faes, MD [facility]   LASIK Bilateral 2018   SKIN CANCER EXCISION     teenager   TEE WITHOUT CARDIOVERSION N/A 04/15/2016   Procedure: TRANSESOPHAGEAL ECHOCARDIOGRAM (TEE);  Surgeon: Jerel Balding, MD;  Location: [facility] ENDOSCOPY;  Service: Cardiovascular;  Laterality: N/A;    Current Outpatient Medications  Medication Sig Dispense Refill   albuterol  (VENTOLIN  HFA) 108 (90 Base) MCG/ACT inhaler Inhale 2 puffs into the lungs every 6 (six) hours as needed for wheezing or shortness of breath. 8 g 0   ALPRAZolam  (XANAX ) 1 MG tablet Take 1 tablet (1 mg total) by mouth 2 (two) times daily as needed for anxiety. 30 tablet 3   aspirin  EC 81 MG tablet Take 1 tablet (81 mg total) by mouth daily. Swallow whole. 90 tablet 3   buPROPion  (WELLBUTRIN  XL) 150 MG 24 hr tablet Take 150 mg by mouth daily.     Cholecalciferol  1.25 MG (50000 UT) TABS Take 1 tablet by mouth once a week. 4 tablet 0   EPINEPHrine  0.3 mg/0.3 mL IJ SOAJ injection Inject into the muscle once.     escitalopram  (LEXAPRO ) 10 MG tablet Take 1  tablet (10 mg total) by mouth daily. 90 tablet 1   Ferrous Sulfate (IRON ) 325 (65 Fe) MG TABS Take 1 tablet (325 mg total) by mouth daily. 30 tablet 0   irbesartan -hydrochlorothiazide  (AVALIDE) 150-12.5 MG tablet Take 1 tablet by mouth daily.     Magnesium 100 MG CAPS Take 100 mg by mouth.     metFORMIN  (GLUCOPHAGE ) 500 MG tablet Take 1 tablet (500 mg total) by mouth daily with breakfast. 90 tablet 0   metoprolol  tartrate (LOPRESSOR ) 50 MG tablet TAKE 1 TABLET 2 HR PRIOR TO CARDIAC PROCEDURE 1 tablet 0   omeprazole  (PRILOSEC) 40 MG capsule TAKE 1 CAPSULE BY MOUTH TWICE A DAY 180 capsule 1   ondansetron  (ZOFRAN -ODT) 4 MG disintegrating tablet Take 1-2  tablets (4-8 mg total) by mouth every 8 (eight) hours as needed. 30 tablet 3   phentermine  15 MG capsule Take 1 capsule (15 mg total) by mouth daily. 30 capsule 0   potassium chloride  (KLOR-CON ) 8 MEQ tablet Take 8 mEq by mouth daily.     rosuvastatin  (CRESTOR ) 5 MG tablet Take 1 tablet (5 mg total) by mouth daily. 90 tablet 3   topiramate  (TOPAMAX ) 100 MG tablet Take 1 tablet (100 mg total) by mouth daily. 90 tablet 0   ursodiol (ACTIGALL) 300 MG capsule Take 300 mg by mouth 2 (two) times daily.     verapamil  (CALAN -SR) 120 MG CR tablet Take 1 tablet (120 mg total) by mouth at bedtime.     No current facility-administered medications for this visit.   Allergies[1]  Mental Health History: Coleby previously met with [facility] provider [facility] to address engagement in emotional eating behaviors. Currently, he is prescribed Xanax  PRN, Wellbutrin , and Lexapro . Menashe reported there is no history of hospitalizations for psychiatric concerns. Shahzad endorsed a family history of mental health/substance abuse related concerns, including ADD (mother), bipolar (mother), and unknown mental health concerns (father). Furthermore, Brandi reported there is no history of trauma including psychological, physical , and sexual abuse, as well as neglect.   Vander denied current alcohol use. He denied tobacco use. He denied illicit/recreational substance use. He endorsed the following caffeine use: 3-4 cups (12 oz each) of coffee daily. Furthermore, [name] indicated he is not experiencing the following: hallucinations and delusions, paranoia, symptoms of mania , and panic attacks. He also denied history of and current suicidal ideation, plan, and intent; history of and current homicidal ideation, plan, and intent; and history of and current engagement in self-harm. While future psychiatric events cannot be accurately predicted, the patient does not currently require acute inpatient psychiatric care and  does not currently meet [facility]  involuntary commitment criteria.  Hyatt endorsed the following coping strategies: fidgeting and reading. He identified the following strengths: supportive relationships, determined, and leader at work. The following barriers were observed/identified: put[ting] up walls and avoidance.   Legal History: Terris reported there is no history of legal involvement.   Structured Assessments Results: The Patient Health Questionnaire-9 (PHQ-9) is a self-report measure that assesses symptoms and severity of depression over the course of the last two weeks. Godric obtained a score of 13 suggesting moderate depression. Abbott finds the endorsed symptoms to be somewhat difficult. [0= Not at all; 1= Several days; 2= More than half the days; 3= Nearly every day] Little interest or pleasure in doing things 2  Feeling down, depressed, or hopeless 1  Trouble falling or staying asleep, or sleeping too much 3  Feeling tired or having little  energy 3  Poor appetite or overeating 1  Feeling bad about yourself --- or that you are a failure or have let yourself or your family down 1  Trouble concentrating on things, such as reading the newspaper or watching television 2  Moving or speaking so slowly that other people could have noticed? Or the opposite --- being so fidgety or restless that you have been moving around a lot more than usual 0  Thoughts that you would be better off dead or hurting yourself in some way 0  PHQ-9 Score 13    The Generalized Anxiety Disorder-7 (GAD-7) is a brief self-report measure that assesses symptoms of anxiety over the course of the last two weeks. Kaileb obtained a score of 8 suggesting mild anxiety. Magdiel finds the endorsed symptoms to be somewhat difficult. [0= Not at all; 1= Several days; 2= Over half the days; 3= Nearly every day] Feeling nervous, anxious, on edge 1  Not being able to stop or control worrying 1  Worrying too much about  different things 1  Trouble relaxing 3  Being so restless that it's hard to sit still 0  Becoming easily annoyed or irritable 2  Feeling afraid as if something awful might happen 0  GAD-7 Score 8   Interventions:  Conducted a chart review Verbally administered PHQ-9 and GAD-7 for symptom monitoring Provided emphatic reflections and validation Conducted a risk assessment Conducted a clinical interview   Provisional DSM-5 Diagnosis(es): Babak reported a history of anxiety and depression related symptomatology during the clinical interview and on administered measures. He also reportedly has been taking Wellbutrin  for depression since age 76 and is currently also taking Lexapro . He describe his anxiety as generalized. Tagen also shared that he was diagnosed with ADHD in childhood, noting use of medication for symptom management until college. Moreover, Edgardo indicated a reduction in emotional eating behaviors and previously met with this provider at [facility]; however, he stated he continues to engage in the aforementioned. Based on the aforementioned, the following diagnosed were assigned: F33.1  Major Depressive Disorder, Recurrent Episode, Moderate, F41.1 Generalized Anxiety Disorder, F90.9 Unspecified Attention-Deficit/Hyperactivity Disorder, and F50.89 Other Specified Feeding or Eating Disorder, Emotional Eating Behaviors. A specifier of Moderate was noted for MDD due to current endorsement of sxs and Recurrent was indicated as Amore discussed a history of depression and start of Wellbutrin  at age 45. Given that this provider did not complete an evaluation for ADHD, Unspecified Attention-Deficit/Hyperactivity Disorder was assigned based on chart review and Dontez's self-report.   Plan: Bari appears able and willing to participate as evidenced engagement in reciprocal conversation and asking questions as needed for clarification. The next appointment is scheduled for  11/13/2024 at 4pm, which will be via [facility]. For homework, Ebin was encouraged to reflect on goal(s) for treatment, as a treatment plan will be developed at the next appointment. This provider will regularly review the treatment plan and medical chart to keep informed of status changes.    Wyatt SHAUNNA Fire, PsyD               [1]  Allergies Allergen Reactions   Citrus Anaphylaxis and Other (See Comments)    Lemon   Flavoring Agent Anaphylaxis   Amoxicillin -Pot Clavulanate Nausea Only   Sulfamethoxazole-Trimethoprim Other (See Comments)    Does not remember, long ago   Vancomycin  Other (See Comments)    Mild red mans syndrome, infuse more slowly.   "

## 2024-10-31 ENCOUNTER — Ambulatory Visit

## 2024-10-31 DIAGNOSIS — R072 Precordial pain: Secondary | ICD-10-CM

## 2024-10-31 DIAGNOSIS — Z8249 Family history of ischemic heart disease and other diseases of the circulatory system: Secondary | ICD-10-CM | POA: Diagnosis not present

## 2024-10-31 DIAGNOSIS — I493 Ventricular premature depolarization: Secondary | ICD-10-CM | POA: Diagnosis not present

## 2024-10-31 LAB — ECHOCARDIOGRAM COMPLETE
AR max vel: 5.07 cm2
AV Area VTI: 4.81 cm2
AV Area mean vel: 4.69 cm2
AV Mean grad: 2 mmHg
AV Peak grad: 4.7 mmHg
Ao pk vel: 1.08 m/s
Area-P 1/2: 3.21 cm2
S' Lateral: 3.36 cm

## 2024-11-07 ENCOUNTER — Ambulatory Visit (INDEPENDENT_AMBULATORY_CARE_PROVIDER_SITE_OTHER): Admitting: Family Medicine

## 2024-11-07 ENCOUNTER — Encounter (INDEPENDENT_AMBULATORY_CARE_PROVIDER_SITE_OTHER): Payer: Self-pay

## 2024-11-09 ENCOUNTER — Ambulatory Visit (INDEPENDENT_AMBULATORY_CARE_PROVIDER_SITE_OTHER): Admitting: Family Medicine

## 2024-11-09 ENCOUNTER — Encounter (INDEPENDENT_AMBULATORY_CARE_PROVIDER_SITE_OTHER): Payer: Self-pay | Admitting: Family Medicine

## 2024-11-09 DIAGNOSIS — R7303 Prediabetes: Secondary | ICD-10-CM

## 2024-11-09 DIAGNOSIS — E669 Obesity, unspecified: Secondary | ICD-10-CM | POA: Diagnosis not present

## 2024-11-09 DIAGNOSIS — R632 Polyphagia: Secondary | ICD-10-CM

## 2024-11-09 DIAGNOSIS — Z6841 Body Mass Index (BMI) 40.0 and over, adult: Secondary | ICD-10-CM

## 2024-11-09 DIAGNOSIS — E7841 Elevated Lipoprotein(a): Secondary | ICD-10-CM

## 2024-11-09 MED ORDER — TOPIRAMATE 100 MG PO TABS: 100.0000 mg | ORAL_TABLET | Freq: Every day | ORAL | 0 refills | Status: AC

## 2024-11-09 MED ORDER — PHENTERMINE HCL 37.5 MG PO TABS: 37.5000 mg | ORAL_TABLET | Freq: Every day | ORAL | 0 refills | Status: AC

## 2024-11-09 NOTE — Progress Notes (Signed)
 "  Alexis [name], D.O.  ABFM, ABOM Specializing in Clinical Bariatric Medicine  Office located at: [address] [place], [place]  [address]   Dr.U patient have not seen this patient prior.   Medications Discontinued During This Encounter  Medication Reason   phentermine  15 MG capsule    topiramate  (TOPAMAX ) 100 MG tablet Reorder     Meds ordered this encounter  Medications   phentermine  (ADIPEX-P ) 37.5 MG tablet    Sig: Take 1 tablet (37.5 mg total) by mouth daily before breakfast.    Dispense:  60 tablet    Refill:  0    Ok to sub capsule if cheaper   topiramate  (TOPAMAX ) 100 MG tablet    Sig: Take 1 tablet (100 mg total) by mouth daily.    Dispense:  90 tablet    Refill:  0      FOR THE CHRONIC DISEASE OF OBESITY:  Chief complaint: Obesity Alexis [name] is here to discuss his progress with his obesity treatment plan.   History of present illness / Interval history:  Alexis [name] is here today for his follow-up office visit.  Since last OV on 09/11/24 with provider: Dr.U, patient has had an increase in stress and struggling to get back on track.   Pt has been struggling with:  []  meal planning and prepping []  exercise []  sleep [x]  stressors []  mood []  chronic or acute medical conditions-  []  eating out more []  Nothing, doing great  Pt has been working on and improving their:  []  meal planning and prepping []  exercise []  sleep hygiene []  water intake []  strategies to better manage personal stressors []  strategies to better manage mood []  eating out less [x]  Nothing particular at this time  When asked by CMA prior to our office visit today, pt states they have been:  - Focused on eating fresh, unprocessed foods?  Yes   - Focused on eating lean proteins with each meal?  Yes   - Sleeping 7-9 Hours/ Night? No - Skipping Meals?  No   - States he is following his healthy eating plan approximately 80 % of the time.   Recent weight loss data history    09/11/24 16:00 11/09/24 16:00   Body Fat % 41.3 % 41.7 %  Muscle Mass (lbs) 211.4 lbs 210.6 lbs  Fat Mass (lbs) 156.2 lbs 158.2 lbs  Total Body Water (lbs) 166.2 lbs 167.2 lbs  Visceral Fat Rating  25 26   Counseling done on how various foods/ behaviors will affect these numbers and how to maximize weight loss success discussed today in detail based on these findings. Total lbs lost to date: - 98 lbs Total Fat Mass in lbs lost to date: - 61.6 lbs Total weight loss percentage to date since starting program:  - 20.55 %    Obesity with starting BMI of 58.0 BMI 50.0-59.9, adult (HCC)-Current BMI 46.15  Physician directed Nutrition Therapy prescription: He is on the Category 4 Plan.    Alexis [name] is currently in the action stage of change. As such, his goal is to continue following meal plan.   He has agreed to: continue current reduced-calorie meal plan    Physician directed Behavioral Modification prescription: Evidence-based interventions for healthy behavior change were utilized today including the discussion of small changes and SMART goals.  barriers to successful adherence to behavorial change for wt loss: life stressors  We discussed the following today: continue to work on maintaining a reduced calorie  state, getting the recommended amount of protein, incorporating whole foods, making healthy choices, staying well hydrated and practicing mindfulness when eating.    Physician directed Physical Activity prescription:  Alexis [name] is doing Yoga 45 minutes 2 days per week and  doing strength training 30 minutes 1 days per week.   barriers to successful adherence to exercise for wt loss: current stressors and life event  Alexis [name] has been educated on Ideal Heart rate during exercise to burn fat.   Exercise prescription: He has agreed to Increase the intensity, frequency or duration of aerobic exercises      Medical Interventions/ Pharmacotherapy  Previously tried medical weight  loss medications/ therapies: None  Pharmacotherapy for weight loss: He is currently taking Phentermine  15 mg daily, Metformin  500 mg daily and topamax  100 mg daily for medical weight loss.     We discussed various medication options to help Alexis [name] with his weight loss efforts and we both agreed to:  Was educated on available treatment options, including pharmacotherapy with emphasis on potential side effects and other available medical interventions and Was educated on GLP-1 receptor agonists, their role in weight management and in the treatment of obesity-related conditions, common side effects, associated costs and challenges with insurance coverage.   SPECIFIC behavorial / nutritional / exercise goals for next office visit:  Contact insurance about injectables   B) OBESITY RELATED CONDITIONS ADDRESSED TODAY:  Elevated lipoprotein(a) Assessment & Plan Patient had an LPA and showed that it was elevated. Patient states that with his elevated cardiac family history he was referred to cardiology. He had a coronary calcium  on 10/23/24 that showed a score of 338. This was 99th percentile for age-. Since then he was started on Crestor  and a baby aspirin . Discussed with patient the importance of continuing to follow up with specialist and PCP. Continue eating on plan. Will reassess at next OV.      Prediabetes Assessment & Plan Lab Results  Component Value Date   HGBA1C 5.2 08/31/2024   HGBA1C 5.5 03/10/2024   HGBA1C 5.7 07/28/2023   INSULIN  64.4 (H) 12/16/2022   INSULIN  34.5 (H) 12/02/2021    Taking Metformin  500 mg daily with reported good compliance and tolerance. Patient denies having any GI upset. No acute concerns. Condition stable. Continue with medication. Continue following prudent meal plan and decreasing simple carbs and sugars.     Polyphagia Assessment & Plan Taking Phentermine  15 mg daily and Topamax  100 mg daily with reported good compliance and tolerance. Patient states  that he is not sure if he has hit a plateau or if he is just tired of what he is doing. He also has had an increase in stressors due to his health and due to the weather. He states that he has had more hunger and cravings. He states that he has had some breakthrough hunger even when eating his protein bars on his drive home. He denies having any increase in side effects like anxiety due to the phentermine . Explained to patient the possible increase in side effects in we increase the dose. PDMP was checked and no concerns were identified. Patient has been on 15 mg since 08/19/23. Mutually agree to increase Phentermine  to 37.5 mg daily. Will refill Topiramate  as well. Will reassess at next OV.     Follow up:   Return 12/04/2024 at 4:00 PM   He was informed of the importance of frequent follow up visits to maximize his success with intensive lifestyle modifications for his multiple health conditions.  Weight Summary and Biometrics   Weight Lost Since Last Visit: 0lb  Weight Gained Since Last Visit: 1lb   Vitals Temp: 98.5 F (36.9 C) BP: 117/79 Pulse Rate: 86 SpO2: 99 %   Anthropometric Measurements Height: 6' 4 (1.93 m) Weight: (!) 379 lb (171.9 kg) BMI (Calculated): 46.15 Weight at Last Visit: 378lb Weight Lost Since Last Visit: 0lb Weight Gained Since Last Visit: 1lb Starting Weight: 477lb Total Weight Loss (lbs): 98 lb (44.5 kg)   Body Composition  Body Fat %: 41.7 % Fat Mass (lbs): 158.2 lbs Muscle Mass (lbs): 210.6 lbs Total Body Water (lbs): 167.2 lbs Visceral Fat Rating : 26   Other Clinical Data Fasting: no Labs: no Today's Visit #: 34 Starting Date: 12/02/21    Objective:   PHYSICAL EXAM: Blood pressure 117/79, pulse 86, temperature 98.5 F (36.9 C), height 6' 4 (1.93 m), weight (!) 379 lb (171.9 kg), SpO2 99%. Body mass index is 46.13 kg/m. General: he is overweight, cooperative and in no acute distress. PSYCH: Has normal mood, affect and thought  process.   HEENT: EOMI, sclerae are anicteric. Lungs: Normal breathing effort, no conversational dyspnea. Extremities: Moves * 4 Neurologic: A and O * 3, good insight  DIAGNOSTIC DATA REVIEWED: BMET    Component Value Date/Time   NA 137 08/31/2024 0000   K 4.0 08/31/2024 0000   CL 104 08/31/2024 0000   CO2 20 08/31/2024 0000   GLUCOSE 96 12/16/2022 0728   GLUCOSE 110 (H) 04/28/2022 1948   BUN 14 08/31/2024 0000   CREATININE 1.1 08/31/2024 0000   CREATININE 1.23 12/16/2022 0728   CREATININE 0.95 05/01/2016 1217   CALCIUM  9.0 08/31/2024 0000   GFRNONAA >60 04/28/2022 1948   GFRAA >60 07/06/2017 0919   Lab Results  Component Value Date   HGBA1C 5.2 08/31/2024   HGBA1C 5.5 03/08/2007   Lab Results  Component Value Date   INSULIN  64.4 (H) 12/16/2022   INSULIN  34.5 (H) 12/02/2021   Lab Results  Component Value Date   TSH 2.19 03/10/2024   CBC    Component Value Date/Time   WBC 8.2 08/31/2024 0000   WBC 10.8 (H) 04/28/2022 1948   RBC 5.66 (A) 08/31/2024 0000   HGB 14.5 08/31/2024 0000   HGB 14.5 12/16/2022 0728   HCT 47 08/31/2024 0000   HCT 45.0 12/16/2022 0728   PLT 266 08/31/2024 0000   PLT 275 12/16/2022 0728   MCV 78 (L) 12/16/2022 0728   MCH 25.0 (L) 12/16/2022 0728   MCH 24.2 (L) 04/28/2022 1948   MCHC 32.2 12/16/2022 0728   MCHC 31.6 04/28/2022 1948   RDW 16.0 (H) 12/16/2022 0728   Iron  Studies    Component Value Date/Time   IRON  39 08/31/2024 0000   TIBC 297 08/31/2024 0000   FERRITIN 84 08/31/2024 0000   IRONPCTSAT 13 08/31/2024 0000   IRONPCTSAT 9 (L) 05/08/2021 1530   Lipid Panel     Component Value Date/Time   CHOL 179 03/10/2024 0000   CHOL 167 12/16/2022 0728   TRIG 142 03/10/2024 0000   HDL 40 03/10/2024 0000   HDL 44 12/16/2022 0728   CHOLHDL 4 02/02/2018 1600   VLDL 29.4 02/02/2018 1600   LDLCALC 114 03/10/2024 0000   LDLCALC 103 (H) 12/16/2022 0728   Hepatic Function Panel     Component Value Date/Time   PROT 6.8 12/16/2022  0728   ALBUMIN 3.8 08/31/2024 0000   ALBUMIN 3.9 (L) 12/16/2022 0728   AST 20 08/31/2024  0000   ALT 19 08/31/2024 0000   ALKPHOS 70 08/31/2024 0000   BILITOT 0.9 12/16/2022 0728   BILIDIR 0.2 06/13/2010 1556      Component Value Date/Time   TSH 2.19 03/10/2024 0000   TSH 2.930 12/02/2021 1050   Nutritional Lab Results  Component Value Date   VD25OH 34.0 08/31/2024   VD25OH 74.8 03/10/2024   VD25OH 38.7 07/28/2023    Attestations:   I, Sonny Laroche, acting as a stage manager for Alexis Jenkins, DO., have compiled all relevant documentation for today's office visit on behalf of Alexis Jenkins, DO, while in the presence of [employer], DO.  I have spent 40 minutes in the care of the patient including:   -   5 minutes before the visit reviewing and preparing the chart.   -   30 minutes face-to-face assessing and reviewing listed medical problems as outlined in obesity care plan, providing nutritional and behavioral counseling on topics outlined in the obesity care plan, independently interpreting test results and goals of care as described in assessment and plan, and reviewing and discussing biometric information and progress with obesity treatment plan  -   5 minutes after the visit regarding the EMR documentation of encounter.   I have reviewed the above documentation for accuracy and completeness, and I agree with the above. Alexis JINNY [name], D.O.  The 21st Century Cures Act was signed into law in 2016 which includes the topic of electronic health records.  This provides immediate access to information in MyChart.  This includes consultation notes, operative notes, office notes, lab results and pathology reports.  If you have any questions about what you read please let us  know at your next visit so we can discuss your concerns and take corrective action if need be.  We are right here with you.  "

## 2024-11-13 ENCOUNTER — Ambulatory Visit: Admitting: Psychology

## 2024-11-13 DIAGNOSIS — F331 Major depressive disorder, recurrent, moderate: Secondary | ICD-10-CM

## 2024-11-13 DIAGNOSIS — F909 Attention-deficit hyperactivity disorder, unspecified type: Secondary | ICD-10-CM

## 2024-11-13 DIAGNOSIS — F411 Generalized anxiety disorder: Secondary | ICD-10-CM

## 2024-11-13 DIAGNOSIS — F5089 Other specified eating disorder: Secondary | ICD-10-CM

## 2024-11-13 NOTE — Addendum Note (Signed)
 Addended by: SHARRON WYATT SQUIBB on: 11/13/2024 11:07 AM   Modules accepted: Level of Service

## 2024-11-13 NOTE — Progress Notes (Signed)
" ° ° °                          Main Office Phone: [phone] Fax: [phone]  Date: November 13, 2024  Name: Alexis [name] MRN: [id number] Appointment Start Time: 4:03pm Duration: 61 minutes Type of Session: Individual Therapy  Location of Patient: Partner's home (address obtained; private location) Location of Provider: Provider's Home (private office) Type of Contact: Telepsychological Visit via Caregility (video and audio capabilities)  Session Content: Today's appointment was a telepsychological visit. [name] provided verbal consent for today's telepsychological appointment and he is aware he is responsible for securing confidentiality on his end of the session. Prior to proceeding with today's appointment, [name]'s physical location at the time of this appointment was obtained as well a phone number he could be reached at in the event of technical difficulties. [name] and this provider participated in today's telepsychological service.   This provider conducted a brief check-in and established an agenda for today's appointment. [name] and this provider collaborated on developing a treatment plan. Discussed diagnosis(es). [name] provided verbal approval of this treatment plan and provided verbal consent to proceed with services. This treatment plan will be signed by [name] after this appointment. Moreover, [name] shared about his relationship with his father, including recent events. Associated thoughts and feelings were processed. Overall, [name] was receptive to today's appointment as evidenced by openness to sharing and responsiveness to feedback.  Mental Status Examination:  Appearance: neat Behavior: appropriate to circumstances Mood: neutral Affect: mood congruent Speech: WNL Eye Contact: appropriate Psychomotor Activity: WNL Gait: unable to assess Thought Process: linear, logical, and goal directed and no evidence or endorsement of suicidal, homicidal, and self-harm ideation,  plan and intent  Thought Content/Perception: no hallucinations, delusions, bizarre thinking or behavior endorsed or observed Orientation: AAOx4 Memory/Concentration: intact Insight/Judgment: fair  Interventions:  Conducted a brief chart review Provided empathic reflections and validation Processed thoughts and feelings Developed a treatment plan  DSM-5 Diagnosis(es): F33.1 Major Depressive Disorder, Recurrent Episode, Moderate, F41.1 Generalized Anxiety Disorder, F90.9 Unspecified Attention-Deficit/Hyperactivity Disorder, and F50.89 Other Specified Feeding or Eating Disorder, Emotional Eating Behaviors  Treatment Goal(s) & Progress: Progress is limited as treatment was recently initiated and the treatment plan was developed during today's appointment. Treatment plan is attached to this encounter.  Plan: The next appointment is scheduled for 11/27/2024 at 4pm, which will be via Caregility. The next session will focus on working towards the established treatment goals. [name] will be sent the treatment plan signature page electronically, which will be signed and returned prior to the next appointment.     Wyatt SHAUNNA Fire, PsyD "

## 2024-11-13 NOTE — Progress Notes (Signed)
 "                        Main Office Phone: [phone] Fax: [phone]   Behavioral Health Treatment Plan Name: Alexis [name] Insurance: [facility] [employer]  (ID: [id number]; Group #: [id number])  MRN: [id number] Treatment Plan Development Date: November 13, 2024    Strengths: openness to sharing, ability to engage in reciprocal conversation, willingness to implement learned skills   Supports: co-workers and partner   Theatre Manager of Needs: Alexis [name] shared, My top 2 or 3 would be my self-esteem and negative self-image and ADD and coping with that. Alexis [name] also discussed a desire to receive ongoing support for eating-related concerns.   Treatment Level: Outpatient individual therapeutic services    Client Treatment Preferences: Alexis [name] reported a desire to meet every 2-3 weeks for therapeutic services.   Diagnosis: F33.1 Major Depressive Disorder, Recurrent Episode, Moderate   Symptoms: low mood, decreased energy, low self-esteem   Goal:  Reduce symptoms of depression  Improve coping skills  Objectives (Target Date For All Objectives: 11/13/2025):  Describe experiences with depression including impact on functioning and attempts to resolve Identify, challenge, and replace thoughts and beliefs that support depression Learn and implement strategies to overcome depression, such as mindfulness, pleasurable activities, journaling, thought defusion  Progress Documentation: Initial treatment plan     Interventions: Eclectic therapeutic approach utilizing techniques from Cognitive Behavioral Therapy (CBT), Client Centered Therapy, Dialectical Behavior Therapy (CBT), Interpersonal Therapy, and Acceptance and Commitment Therapy (ACT); therapeutic approach will include various interventions, such as validation, reflections, mindfulness, thought defusion, cognitive reframing, boundary setting, values assessment, psychoeducation, motivational interviewing, relaxation training, goal setting,  problem solving   Diagnosis: F41.1 Generalized Anxiety Disorder   Symptoms: wandering thoughts, constant worry, feeling of impending doom, difficulty breathing, and chest tightness   Goals: Reduce symptoms of anxiety, including intensity, frequency, and duration of symptoms Improve coping skills   Objectives (Target Date For All Objectives: 11/13/2025):  Express understanding of anxiety diagnosis, treatment approaches  Identify triggers for anxiety Learn and implement coping strategies to manage anxiety such as mindfulness, thought defusion, relaxation strategies, reframing, and challenging negative thoughts and feelings  Progress Documentation: Initial treatment plan   Interventions: Eclectic therapeutic approach utilizing techniques from Cognitive Behavioral Therapy (CBT), Client Centered Therapy, Dialectical Behavior Therapy (CBT), Interpersonal Therapy, and Acceptance and Commitment Therapy (ACT); therapeutic approach will include various interventions, such as validation, reflections, mindfulness, thought defusion, cognitive reframing, boundary setting, values assessment, psychoeducation, motivational interviewing, relaxation training, goal setting, problem solving   Diagnosis: F90.9 Unspecified Attention-Deficit/Hyperactivity Disorder   Symptoms: difficulty falling asleep due to distractions, relational strain, racing thoughts, forgetfulness, attention and concentration issues, talking over others, blurting out    Goals: Reduce effect of symptoms on day-to-day functioning Learn more about ADHD diagnosis, treatment, and need for various interventions   Objectives (Target Date For All Objectives: 11/13/2025):  Receive psychoeducation regarding ADHD diagnosis via therapeutic process, handouts, and literature Learn and implement employ skills to manage ADHD symptoms, such as utilizing reminders and planners, developing plans for organization, mindfulness Identify, challenge, and manage  negative self-talk that contribute to dysfunctional feelings and behaviors   Progress Documentation: Initial treatment plan   Interventions: Eclectic therapeutic approach utilizing techniques from Cognitive Behavioral Therapy (CBT), Client Centered Therapy, Dialectical Behavior Therapy (CBT), Interpersonal Therapy, and Acceptance and Commitment Therapy (ACT); therapeutic approach will include various interventions, such as validation, reflections, mindfulness, thought defusion, cognitive reframing, boundary setting, values assessment, psychoeducation, motivational  interviewing, relaxation training, goal setting, problem solving  Diagnosis: F50.89 Other Specified Feeding or Eating Disorder, Emotional Eating Behaviors   Symptoms: obesity, emotional eating, and overindulging   Goals: Reduce engagement in emotional eating behaviors  Objectives (Target Date For All Objectives: 11/13/2025):  Learn to differentiate between emotional and physical hunger Identify triggers for emotional eating behaviors Learn and implement effective coping strategies, such as urge surfing, mindfulness, opposite action   Progress Documentation: Initial treatment plan   Interventions: Eclectic therapeutic approach utilizing techniques from Cognitive Behavioral Therapy (CBT), Client Centered Therapy, Dialectical Behavior Therapy (CBT), Interpersonal Therapy, and Acceptance and Commitment Therapy (ACT); therapeutic approach will include various interventions, such as validation, reflections, mindfulness, thought defusion, cognitive reframing, boundary setting, values assessment, psychoeducation, motivational interviewing, relaxation training, goal setting, problem solving   Expected duration of treatment: Evaluate after 12 months of treatment    Party responsible for implementation of interventions: Alexis [name] (patient) and Alexis SHAUNNA Fire, PsyD. (Provider)   This plan has been reviewed and created by the following  participants: Alexis [name] (patient) and Alexis SHAUNNA Fire, PsyD. (Provider)   A new plan will be created at least every 12 months.   The patient fully participated in the development of treatment plan with the provider and verbally consents to such treatment.    Patient Treatment Plan Signature Obtained: Alexis [name] provided verbal approval of this treatment plan and provided verbal consent to proceed with services. This treatment plan will be sent to Alexis [name] electronically to be signed and returned prior to the next appointment.      Alexis SHAUNNA Fire, PsyD  "

## 2024-11-27 ENCOUNTER — Ambulatory Visit: Admitting: Psychology

## 2024-12-04 ENCOUNTER — Ambulatory Visit (INDEPENDENT_AMBULATORY_CARE_PROVIDER_SITE_OTHER): Admitting: Family Medicine

## 2025-01-01 ENCOUNTER — Ambulatory Visit (INDEPENDENT_AMBULATORY_CARE_PROVIDER_SITE_OTHER): Admitting: Family Medicine
